# Patient Record
Sex: Male | Born: 1950 | Race: White | Hispanic: No | Marital: Married | Smoking: Former smoker
Health system: Southern US, Community
[De-identification: ages and names within clinical notes are randomized; demographics above are authoritative.]

## PROBLEM LIST (undated history)

## (undated) DIAGNOSIS — J189 Pneumonia, unspecified organism: Secondary | ICD-10-CM

## (undated) DIAGNOSIS — I10 Essential (primary) hypertension: Secondary | ICD-10-CM

## (undated) DIAGNOSIS — D696 Thrombocytopenia, unspecified: Secondary | ICD-10-CM

## (undated) DIAGNOSIS — T7840XA Allergy, unspecified, initial encounter: Secondary | ICD-10-CM

## (undated) DIAGNOSIS — R972 Elevated prostate specific antigen [PSA]: Secondary | ICD-10-CM

## (undated) DIAGNOSIS — N400 Enlarged prostate without lower urinary tract symptoms: Secondary | ICD-10-CM

## (undated) DIAGNOSIS — M722 Plantar fascial fibromatosis: Secondary | ICD-10-CM

## (undated) HISTORY — DX: Benign prostatic hyperplasia without lower urinary tract symptoms: N40.0

## (undated) HISTORY — DX: Allergy, unspecified, initial encounter: T78.40XA

## (undated) HISTORY — DX: Thrombocytopenia, unspecified: D69.6

## (undated) HISTORY — PX: OTHER SURGICAL HISTORY: SHX169

## (undated) HISTORY — DX: Essential (primary) hypertension: I10

## (undated) HISTORY — DX: Pneumonia, unspecified organism: J18.9

## (undated) HISTORY — DX: Elevated prostate specific antigen (PSA): R97.20

---

## 2009-09-06 ENCOUNTER — Ambulatory Visit: Payer: Self-pay | Admitting: Internal Medicine

## 2010-03-24 ENCOUNTER — Ambulatory Visit: Payer: Self-pay | Admitting: Internal Medicine

## 2010-09-19 ENCOUNTER — Other Ambulatory Visit: Payer: Commercial Managed Care - PPO

## 2010-09-19 DIAGNOSIS — I10 Essential (primary) hypertension: Secondary | ICD-10-CM

## 2010-09-19 DIAGNOSIS — R972 Elevated prostate specific antigen [PSA]: Secondary | ICD-10-CM

## 2010-09-19 LAB — CBC WITH DIFFERENTIAL/PLATELET
Basophils Relative: 0 % (ref 0–1)
Eosinophils Absolute: 0.2 10*3/uL (ref 0.0–0.7)
Eosinophils Relative: 3 % (ref 0–5)
HCT: 40.6 % (ref 39.0–52.0)
Hemoglobin: 13.6 g/dL (ref 13.0–17.0)
Lymphs Abs: 1.3 10*3/uL (ref 0.7–4.0)
MCH: 33.5 pg (ref 26.0–34.0)
MCHC: 33.5 g/dL (ref 30.0–36.0)
MCV: 100 fL (ref 78.0–100.0)
Monocytes Absolute: 0.1 10*3/uL (ref 0.1–1.0)
Monocytes Relative: 2 % — ABNORMAL LOW (ref 3–12)
Neutrophils Relative %: 71 % (ref 43–77)
RBC: 4.06 MIL/uL — ABNORMAL LOW (ref 4.22–5.81)

## 2010-09-19 LAB — COMPREHENSIVE METABOLIC PANEL
Alkaline Phosphatase: 66 U/L (ref 39–117)
BUN: 25 mg/dL — ABNORMAL HIGH (ref 6–23)
CO2: 22 mEq/L (ref 19–32)
Glucose, Bld: 96 mg/dL (ref 70–99)
Sodium: 140 mEq/L (ref 135–145)
Total Bilirubin: 0.6 mg/dL (ref 0.3–1.2)
Total Protein: 6.7 g/dL (ref 6.0–8.3)

## 2010-09-19 LAB — LIPID PANEL
Cholesterol: 183 mg/dL (ref 0–200)
HDL: 42 mg/dL (ref >39)
LDL Cholesterol: 124 mg/dL — ABNORMAL HIGH (ref 0–99)
Triglycerides: 84 mg/dL (ref <150)
VLDL: 17 mg/dL (ref 0–40)

## 2010-09-21 ENCOUNTER — Encounter: Payer: Self-pay | Admitting: Internal Medicine

## 2010-09-21 ENCOUNTER — Ambulatory Visit (INDEPENDENT_AMBULATORY_CARE_PROVIDER_SITE_OTHER): Payer: Commercial Managed Care - PPO | Admitting: Internal Medicine

## 2010-09-21 VITALS — BP 128/90 | HR 52 | Temp 98.1°F | Ht 70.25 in | Wt 194.0 lb

## 2010-09-21 DIAGNOSIS — E785 Hyperlipidemia, unspecified: Secondary | ICD-10-CM

## 2010-09-21 DIAGNOSIS — I10 Essential (primary) hypertension: Secondary | ICD-10-CM

## 2010-09-21 DIAGNOSIS — Z0000 Encounter for general adult medical examination without abnormal findings: Secondary | ICD-10-CM

## 2010-09-21 LAB — POCT URINALYSIS DIPSTICK
Blood, UA: NEGATIVE
Clarity, UA: NEGATIVE
Ketones, UA: NEGATIVE
NEG CONTROL: NEGATIVE
Protein, UA: NEGATIVE
Spec Grav, UA: 1.01
Urobilinogen, UA: NEGATIVE

## 2010-09-21 MED ORDER — OLMESARTAN MEDOXOMIL 40 MG PO TABS: 40.0000 mg | ORAL_TABLET | Freq: Every day | ORAL | Status: DC

## 2010-09-21 MED ORDER — SIMVASTATIN 10 MG PO TABS: 10.0000 mg | ORAL_TABLET | Freq: Every evening | ORAL | Status: DC

## 2010-09-21 NOTE — Patient Instructions (Signed)
Take Zocor with supper. Continue Benicar. Return in 6 months or sooner if needed.

## 2010-09-21 NOTE — Progress Notes (Signed)
  Subjective:    Patient ID: Eddie Calhoun, male    DOB: 1951-04-06, 61 y.o.   MRN: [id number]  HPI58 year old W male for CPE and evaluation of HTN. Currently taking Benicar with reasonably good control but should watch BP at home more regularly. Not traveling to [place] quite as much. Went to [facility] recently for prostate followup. Hx of elevated PSA> Recent biopsy was negative. Only new c/o is some throbbing in upper sternal area in am after drinking coffee- self resolves without other symptoms. May be related to palpitations from caffeine ingestion. He says this has gotten better so will not pursue at this time.    Review of Systems  Constitutional: Negative for fatigue and unexpected weight change.  HENT: Positive for rhinorrhea and sneezing. Negative for hearing loss.   Eyes: Negative for itching and visual disturbance.  Respiratory: Negative for cough, choking, chest tightness, shortness of breath and wheezing.   Cardiovascular: Negative for chest pain and leg swelling.  Gastrointestinal: Negative for nausea, vomiting, abdominal pain, diarrhea, constipation, blood in stool and rectal pain.  Genitourinary: Positive for decreased urine volume (slight decrease in urine flow). Negative for difficulty urinating and testicular pain.  Musculoskeletal: Negative for myalgias and arthralgias.  Neurological: Negative for dizziness, syncope and headaches.  Hematological: Does not bruise/bleed easily.  Psychiatric/Behavioral: Negative for dysphoric mood and decreased concentration.       Objective:   Physical Exam  Constitutional: He is oriented to person, place, and time. He appears well-nourished. No distress.  HENT:  Head: Normocephalic.  Mouth/Throat: Oropharynx is clear and moist. No oropharyngeal exudate.  Eyes: Conjunctivae are normal. Pupils are equal, round, and reactive to light. Right eye exhibits no discharge. Left eye exhibits no discharge. No scleral icterus.  Neck: Neck supple. No  JVD present. No tracheal deviation present. No thyromegaly present.  Cardiovascular: Normal rate, regular rhythm, normal heart sounds and intact distal pulses.   No murmur heard. Pulmonary/Chest: Effort normal. No respiratory distress. He has wheezes. He has no rales. He exhibits no tenderness.  Abdominal: Soft. Bowel sounds are normal. He exhibits no distension and no mass. There is no tenderness. There is no rebound and no guarding.  Musculoskeletal: He exhibits no edema.  Lymphadenopathy:    He has no cervical adenopathy.  Neurological: He is alert and oriented to person, place, and time. He displays normal reflexes. No cranial nerve deficit. He exhibits normal muscle tone.  Skin: Skin is warm and dry.  Psychiatric: He has a normal mood and affect. His behavior is normal. Judgment and thought content normal.   Fasting labs reviewed. LDL still elevated at 120s same as last year but total cholesterol is WNL.        Assessment & Plan:   1- HTN stable on Benicar 2-Prostatism per [facility] evaluation is stable and being followed there regularly Return in 6 months 3- Start Zocor 10 mg daily for mild hyperlipidemia

## 2010-09-25 ENCOUNTER — Other Ambulatory Visit: Payer: Self-pay | Admitting: *Deleted

## 2010-09-25 DIAGNOSIS — I10 Essential (primary) hypertension: Secondary | ICD-10-CM

## 2010-09-25 DIAGNOSIS — E785 Hyperlipidemia, unspecified: Secondary | ICD-10-CM

## 2010-09-25 MED ORDER — OLMESARTAN MEDOXOMIL 40 MG PO TABS: 40.0000 mg | ORAL_TABLET | Freq: Every day | ORAL | Status: DC

## 2010-09-25 MED ORDER — SIMVASTATIN 10 MG PO TABS: 10.0000 mg | ORAL_TABLET | Freq: Every evening | ORAL | Status: DC

## 2012-07-15 ENCOUNTER — Telehealth: Payer: Self-pay | Admitting: Oncology

## 2012-07-15 NOTE — Telephone Encounter (Signed)
S/W pt in re NP appt 04/23 @ 3 w/Dr. Cyndie Chime Referring Dr. Danise Edge Dx- Plts [facility]   Welcome packet mailed.

## 2012-07-17 ENCOUNTER — Telehealth: Payer: Self-pay | Admitting: Oncology

## 2012-07-17 NOTE — Telephone Encounter (Signed)
C/D 07/17/12 for appt. 08/27/12

## 2012-07-26 ENCOUNTER — Encounter: Payer: Self-pay | Admitting: Oncology

## 2012-07-26 ENCOUNTER — Other Ambulatory Visit: Payer: Self-pay | Admitting: Oncology

## 2012-07-26 DIAGNOSIS — D696 Thrombocytopenia, unspecified: Secondary | ICD-10-CM

## 2012-07-26 HISTORY — DX: Thrombocytopenia, unspecified: D69.6

## 2012-07-29 ENCOUNTER — Telehealth: Payer: Self-pay | Admitting: Oncology

## 2012-07-29 NOTE — Telephone Encounter (Signed)
S/W PT WIFE IN RE TO APPT CHANGE TO 4/30 @ 3 W/DR. GRANFORTUNA NEW CALENDAR MAILED.

## 2012-08-27 ENCOUNTER — Ambulatory Visit: Payer: Commercial Managed Care - PPO

## 2012-08-27 ENCOUNTER — Other Ambulatory Visit: Payer: Commercial Managed Care - PPO | Admitting: Lab

## 2012-08-27 ENCOUNTER — Encounter: Payer: Commercial Managed Care - PPO | Admitting: Oncology

## 2012-09-01 ENCOUNTER — Telehealth: Payer: Self-pay | Admitting: Oncology

## 2012-09-01 NOTE — Telephone Encounter (Signed)
S/w pt wife in re to NP appt changeto 05/01 @ 9:30

## 2012-09-01 NOTE — Telephone Encounter (Signed)
S/w wife in re to appt change due to MD will not be in office gave new d/t 05/21 @ 9:30.  Will calling referring office to inform of appt change.

## 2012-09-03 ENCOUNTER — Other Ambulatory Visit: Payer: Commercial Managed Care - PPO | Admitting: Lab

## 2012-09-03 ENCOUNTER — Encounter: Payer: Commercial Managed Care - PPO | Admitting: Oncology

## 2012-09-03 ENCOUNTER — Ambulatory Visit: Payer: Commercial Managed Care - PPO

## 2012-09-04 ENCOUNTER — Encounter: Payer: Self-pay | Admitting: Oncology

## 2012-09-04 ENCOUNTER — Ambulatory Visit (HOSPITAL_BASED_OUTPATIENT_CLINIC_OR_DEPARTMENT_OTHER): Payer: Commercial Managed Care - PPO | Admitting: Oncology

## 2012-09-04 ENCOUNTER — Other Ambulatory Visit (HOSPITAL_BASED_OUTPATIENT_CLINIC_OR_DEPARTMENT_OTHER): Payer: Commercial Managed Care - PPO | Admitting: Lab

## 2012-09-04 ENCOUNTER — Ambulatory Visit: Payer: Commercial Managed Care - PPO

## 2012-09-04 ENCOUNTER — Telehealth: Payer: Self-pay | Admitting: Oncology

## 2012-09-04 VITALS — BP 133/81 | HR 56 | Temp 97.8°F | Resp 18 | Ht 71.0 in | Wt 195.0 lb

## 2012-09-04 DIAGNOSIS — D696 Thrombocytopenia, unspecified: Secondary | ICD-10-CM

## 2012-09-04 LAB — CBC WITH DIFFERENTIAL/PLATELET
BASO%: 0.3 % (ref 0.0–2.0)
EOS%: 4.2 % (ref 0.0–7.0)
MCH: 33.8 pg — ABNORMAL HIGH (ref 27.2–33.4)
MCHC: 35 g/dL (ref 32.0–36.0)
MONO#: 0.3 10*3/uL (ref 0.1–0.9)
RBC: 4.24 10*6/uL (ref 4.20–5.82)
RDW: 12.4 % (ref 11.0–14.6)
WBC: 4.2 10*3/uL (ref 4.0–10.3)
lymph#: 0.8 10*3/uL — ABNORMAL LOW (ref 0.9–3.3)

## 2012-09-04 LAB — MORPHOLOGY: PLT EST: DECREASED

## 2012-09-04 LAB — COMPREHENSIVE METABOLIC PANEL (CC13)
ALT: 30 U/L (ref 0–55)
AST: 23 U/L (ref 5–34)
CO2: 26 mEq/L (ref 22–29)
Sodium: 139 mEq/L (ref 136–145)
Total Bilirubin: 0.34 mg/dL (ref 0.20–1.20)
Total Protein: 7.4 g/dL (ref 6.4–8.3)

## 2012-09-04 LAB — CHCC SMEAR

## 2012-09-04 LAB — LACTATE DEHYDROGENASE (CC13): LDH: 174 U/L (ref 125–245)

## 2012-09-04 NOTE — Progress Notes (Signed)
Checked in new pt with no financial concerns. °

## 2012-09-04 NOTE — Telephone Encounter (Signed)
Return prn per 5/1/pof

## 2012-09-05 NOTE — Progress Notes (Signed)
New Patient Hematology-Oncology Evaluation   Eddie Calhoun [id number] 1950-07-12 62 y.o. 09/05/2012  CC: Dr. Danise Edge   Reason for referral: Evaluate mild thrombocytopenia   HPI:  62 year old man who works in [employer] certified [employer] who has been in overall excellent health without any major medical or surgical illness. 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 He was critically ill at the time. He developed pneumonia and liver failure. He had persistent liver function abnormalities for many years which eventually normalized. Other than the liver failure associated with his acute illness over 10 years ago, he had no prior or subsequent episodes of viral hepatitis. His medications are limited to Benicar, aspirin, B12 and multivitamin supplements. He has no signs or symptoms of a collagen vascular disorder. He has had a fluctuating mild decrease in his platelet count. Data provided goes back to October of 2012 when platelet count was recorded as 151,000. In November 2013: 129,000, in March 2014 123,000, and Today 125,000. Hemoglobin and White Count As Well As White Count Differential Have Been Normal. On 07/07/2012 Hemoglobin 14.8, Hematocrit 43.9, MCV 98.9, White Count 5200, 76% Neutrophils, 15 Lymphocytes 6 Monocytes, 3 Eosinophils, and Platelet Count 131,000. He's Had Borderline Macrocytic Red Cell Indices MCV up to 102.  There Is No Family History of Any Blood Disorder.   PMH: Past Medical History  Diagnosis Date  . Hypertension   . Allergy     allergic rhinitis  . Elevated prostate specific antigen (PSA)   . BPH (benign prostatic hyperplasia)   . Pneumonia   . Thrombocytopenia, unspecified 07/26/2012    03/24/12  129,000!  No history of MI, ulcers, diabetes, thyroid disease, renal disease, seizure, stroke, blood clots.  No major surgery.  Allergies: Allergies  Allergen Reactions  . Contrast Media (Iodinated Diagnostic Agents) Hives    Medications:  See history of present illness   Social History: Married in 1 wife accompanies him today. He has no history of exposure to toxic chemicals or radiation.  he quit smoking about 24 years ago. His smoking use included Cigarettes. He has a 40 pack-year smoking history.Marland Kitchen He reports that he does not drink alcohol. Except for a rare glass of champagne. He did not drink at all for the last 10 years since his episode of legionnaire's associated liver malfunction.  Family History: Family History  Problem Relation Age of Onset  . Hypertension Mother   . Hypertension Father   . Cancer Father 33    father died with hx of prostate cancer    Review of Systems: Constitutional symptoms: No constitutional symptoms HEENT: No sore throat Respiratory: No cough or dyspnea Cardiovascular:  No chest pain or palpitations Gastrointestinal ROS: No abdominal pain or change in bowel habit Genito-Urinary ROS: No urinary tract symptoms. History of elevated PSA. Prostate biopsies negative for cancer. Subsequent normalization of PSA. Elevated PSA noted after a long airplane ride. Hematological and Lymphatic: No swollen glands  Musculoskeletal: No muscle bone or joint pain Neurologic: No headache or change in vision Dermatologic: No rash or ecchymosis Remaining ROS negative.  Physical Exam: Blood pressure 133/81, pulse 56, temperature 97.8 F (36.6 C), temperature source Oral, resp. rate 18, height 5\' 11"  (1.803 m), weight 195 lb (88.451 kg). Wt Readings from Last 3 Encounters:  09/04/12 195 lb (88.451 kg)  09/21/10 194 lb (87.998 kg)    General appearance: Well-nourished Caucasian man HENNT: Pharynx no erythema or exudate Lymph nodes: No lymphadenopathy Breasts: Lungs: Clear to auscultation resonant to percussion  Heart: Regular rhythm no murmur Vascular: No cyanosis Abdominal: Soft, nontender, no mass, no organomegaly GU: Extremities: No edema, no calf tenderness Neurologic: Mental status intact,  PERRLA, optic disc sharp, vessels normal, motor strength 5 over 5, reflexes 1+ symmetric, sensation intact to vibration Skin: No rash or ecchymosis    Lab Results: Lab Results: White count differential: 70% neutrophils, 18% lymphocytes, 7% monocytes, 3% eosinophils   Component Value Date   WBC 4.2 09/04/2012   HGB 14.3 09/04/2012   HCT 41.0 09/04/2012   MCV 96.6 09/04/2012   PLT 125* 09/04/2012     Chemistry      Component Value Date/Time   NA 139 09/04/2012 0955   NA 140 09/19/2010 0910   K 5.0 09/04/2012 0955   K 4.3 09/19/2010 0910   CL 105 09/04/2012 0955   CL 107 09/19/2010 0910   CO2 26 09/04/2012 0955   CO2 22 09/19/2010 0910   BUN 30.7* 09/04/2012 0955   BUN 25* 09/19/2010 0910   CREATININE 1.4* 09/04/2012 0955   CREATININE 1.17 09/19/2010 0910      Component Value Date/Time   CALCIUM 9.4 09/04/2012 0955   CALCIUM 9.1 09/19/2010 0910   ALKPHOS 66 09/04/2012 0955   ALKPHOS 66 09/19/2010 0910   AST 23 09/04/2012 0955   AST 23 09/19/2010 0910   ALT 30 09/04/2012 0955   ALT 29 09/19/2010 0910   BILITOT 0.34 09/04/2012 0955   BILITOT 0.6 09/19/2010 0910       Review of peripheral blood film: Normochromic normocytic red cells. Mature white cells. Platelets appear normal in number and morphology.       Impression and Plan: Chronic, mild, fluctuating, thrombocytopenia.  My clinical impression is that this is related to previous legionnaires infection with associated liver damage. I don't have any data of prior to 2012 blood but per the patient's wife, who keeps meticulous records, she believes that his platelet count was slightly decreased in the past. New guidelines with respect to chronic ITP have redefined the normal platelet count as being 100,000 or above. Platelet counts in the 100-150,000 range when followed over 10 years in a large epidemiologic study showed further decline in only about 10% of people. Worldwide, average platelet count is 100,000 or above.  Recommendation: The patient is  reassured that he does not have a major problem with his blood. He can continue annual followup with his family physician. If things change in the future I will be happy to see him again.      Levert Feinstein, MD 09/05/2012, 7:25 AM

## 2012-09-24 ENCOUNTER — Encounter: Payer: Commercial Managed Care - PPO | Admitting: Oncology

## 2012-09-24 ENCOUNTER — Ambulatory Visit: Payer: Commercial Managed Care - PPO

## 2012-09-24 ENCOUNTER — Other Ambulatory Visit: Payer: Commercial Managed Care - PPO | Admitting: Lab

## 2016-10-02 ENCOUNTER — Telehealth: Payer: Self-pay

## 2016-10-02 NOTE — Telephone Encounter (Signed)
Called to clarify with patients wife why the patient need to be seen by Dr. Marlou Porch as patients appt note only states "Patients wife scheduled appointment. D.Miller". Patients wife explained to me that she and Dr. Marlou Porch had a conversation about family member who suddenly died due to cardiac issues and suggested she and her husband have cardiac workup with Calcium Scoring done to assess cardiac health. Patient instructed me that Dr. Earle Gell with [facility] [place] is the Patients PCP and he would be sending a note to Dr. Marlou Porch in regards to patient recent [place]. She instructed me to call PCP and get all notes as Dr. Wynetta Emery is aware that the patient is scheduled to see Dr. Marlou Porch. We are both agreeable to plan. I will contact PCP to have notes faxed

## 2016-10-18 ENCOUNTER — Ambulatory Visit (INDEPENDENT_AMBULATORY_CARE_PROVIDER_SITE_OTHER)
Admission: RE | Admit: 2016-10-18 | Discharge: 2016-10-18 | Disposition: A | Discharge status: Home / Self Care | Payer: Self-pay | Source: Ambulatory Visit | Attending: Cardiology | Admitting: Cardiology

## 2016-10-18 ENCOUNTER — Ambulatory Visit (INDEPENDENT_AMBULATORY_CARE_PROVIDER_SITE_OTHER): Payer: Commercial Managed Care - PPO | Admitting: Cardiology

## 2016-10-18 ENCOUNTER — Encounter (INDEPENDENT_AMBULATORY_CARE_PROVIDER_SITE_OTHER): Payer: Self-pay

## 2016-10-18 ENCOUNTER — Encounter: Payer: Self-pay | Admitting: Cardiology

## 2016-10-18 VITALS — BP 124/82 | HR 53 | Ht 70.0 in | Wt 202.4 lb

## 2016-10-18 DIAGNOSIS — Z87891 Personal history of nicotine dependence: Secondary | ICD-10-CM | POA: Diagnosis not present

## 2016-10-18 DIAGNOSIS — I10 Essential (primary) hypertension: Secondary | ICD-10-CM | POA: Diagnosis not present

## 2016-10-18 IMAGING — CT CT HEART SCORING
2 series · 16 of 20 positions shown, 18 images · non-contrast
Comparison: None.

CLINICAL DATA: Risk stratification

EXAM:
Coronary Calcium Score
TECHNIQUE: The patient was scanned on a Siemens Somatom 64 slice scanner. Axial
non-contrast 3 mm slices were carried out through the heart. The
data set was analyzed on a dedicated work station and scored using
the Agatson method.

[Series 2: casc 3.0 i36f 2 bestdiast 70 % · axial · 0.41mm/px · z∈[-241,-136]mm · 8 of 45 slices shown, 10 images]
[im 5/45  vessel]
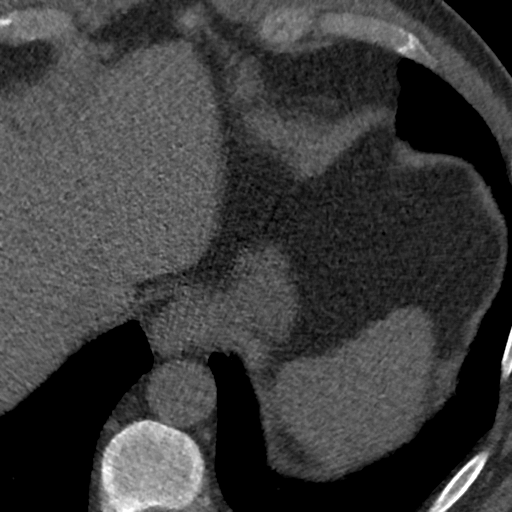
[im 5/45  lung]
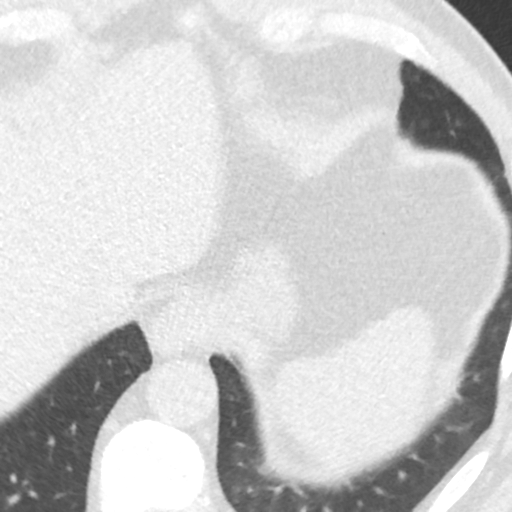
[im 10/45  vessel]
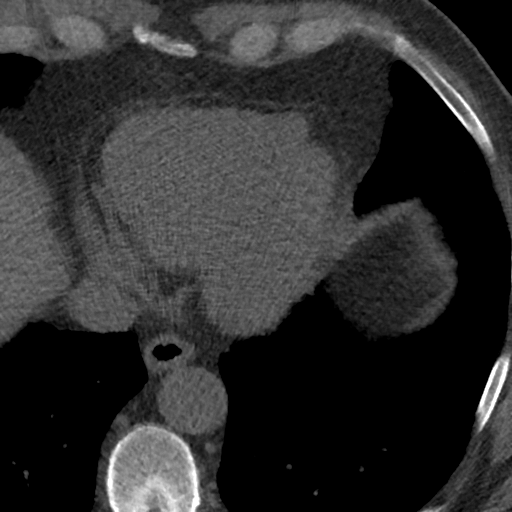
[im 15/45  vessel]
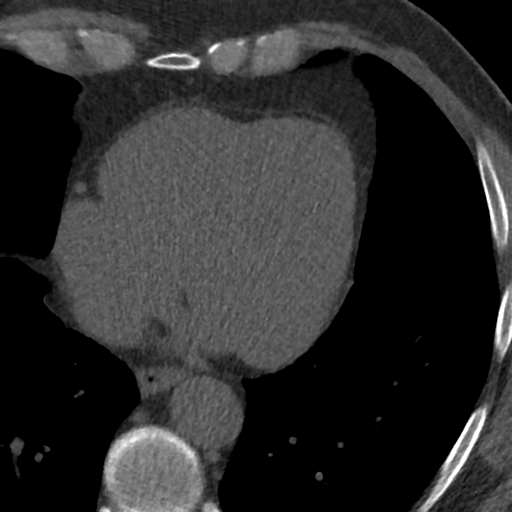
[im 20/45  vessel]
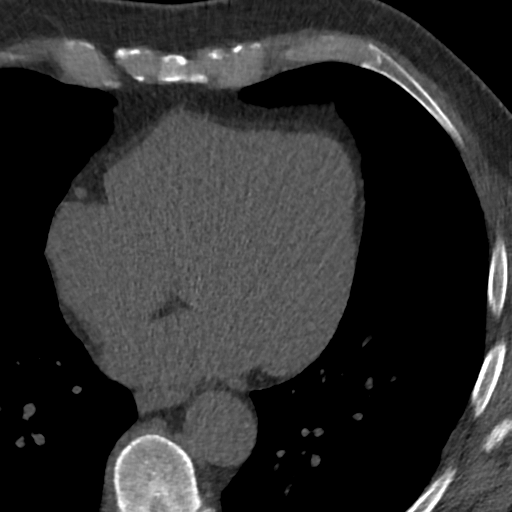
[im 25/45  vessel]
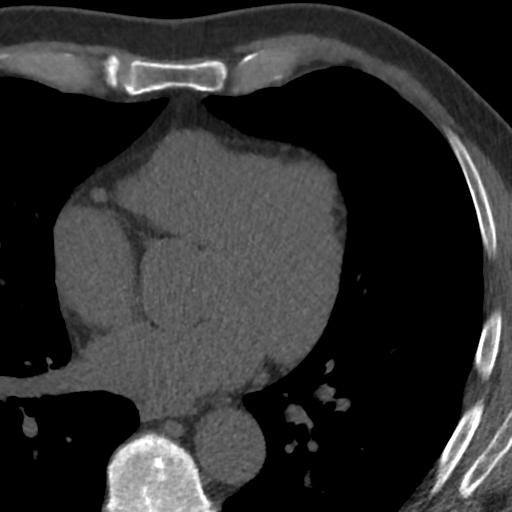
[im 25/45  lung]
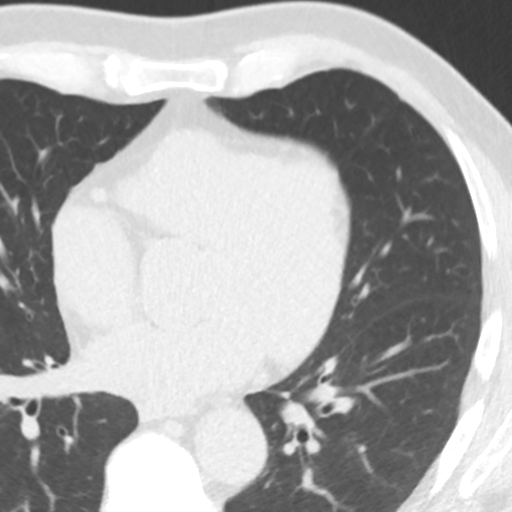
[im 30/45  vessel]
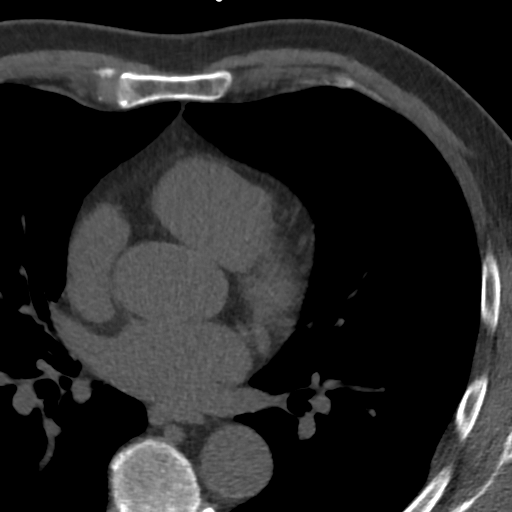
[im 35/45  vessel]
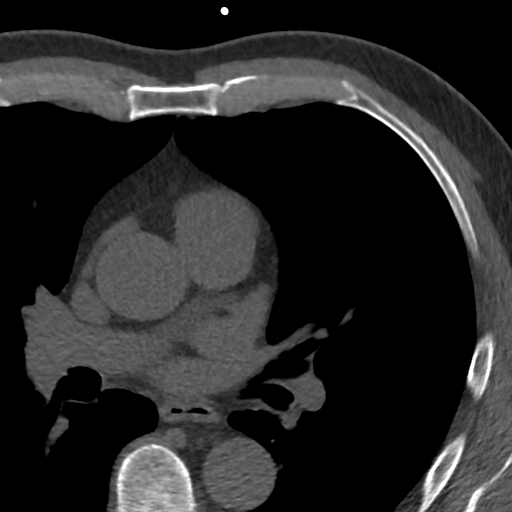
[im 40/45  vessel]
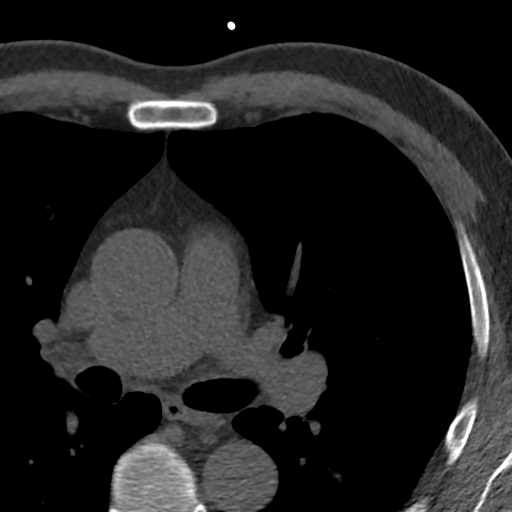

[Series 4: lung st 70 % · axial · 0.70mm/px · z∈[-241,-136]mm · 8 of 45 slices shown]
[im 5/45  lung]
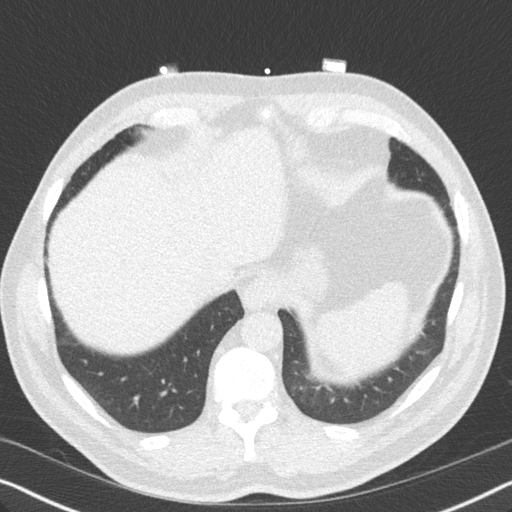
[im 10/45  lung]
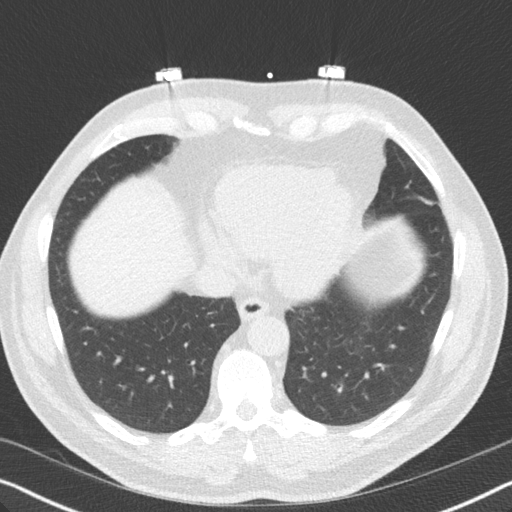
[im 15/45  lung]
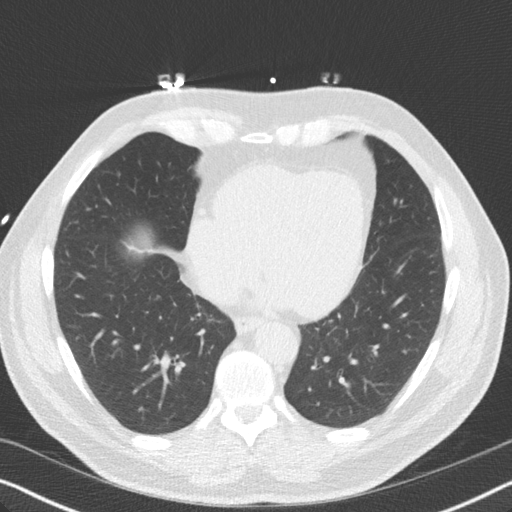
[im 20/45  lung]
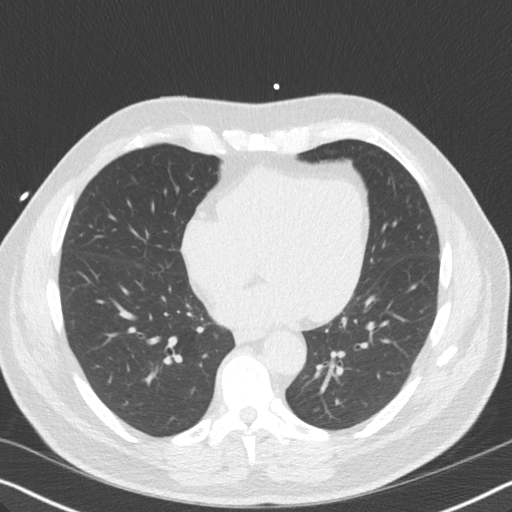
[im 25/45  lung]
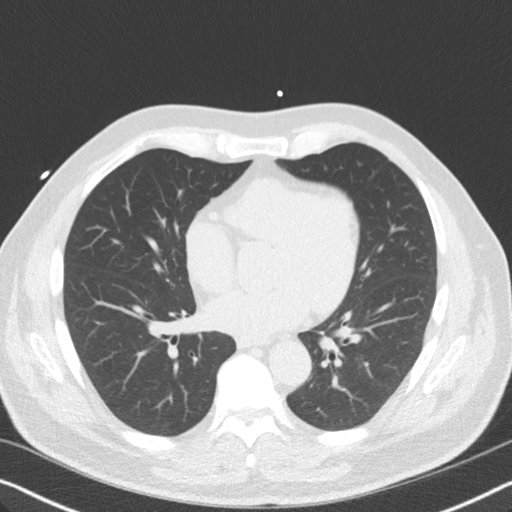
[im 30/45  lung]
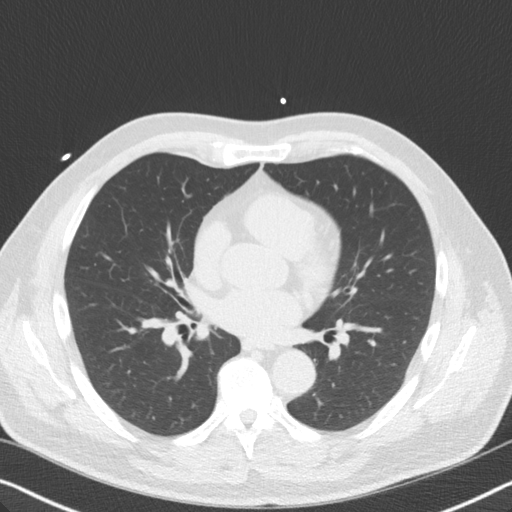
[im 35/45  lung]
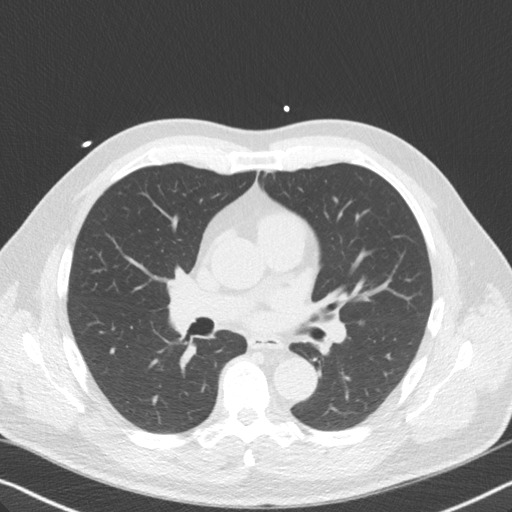
[im 40/45  lung]
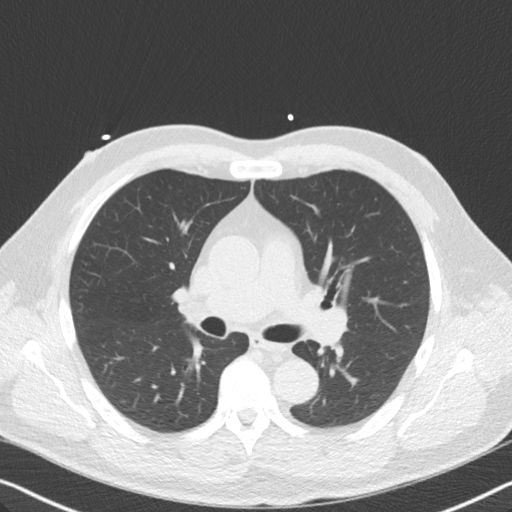

[16 of 20 positions shown; findings below may reference images not displayed]

FINDINGS: Non-cardiac: See separate report from [REDACTED].

Ascending Aorta:  3.6 cm

Pericardium: Normal

Coronary arteries:  No calcium detected
IMPRESSION: Coronary calcium score of 0.

ABO WESAM

EXAM:
OVER-READ INTERPRETATION  CT CHEST

The following report is an over-read performed by radiologist Dr.
over-read does not include interpretation of cardiac or coronary
anatomy or pathology. The coronary calcium score/coronary CTA
interpretation by the cardiologist is attached.
FINDINGS: Small amount of calcified atherosclerotic plaque in the proximal
ascending thoracic aorta (axial image 10 of series 4). A few tiny
pulmonary nodules are noted in the left lower lobe, largest of which
measures only 4 mm (axial image 37 of series 3). Within the
visualized portions of the thorax there are no larger more
suspicious appearing pulmonary nodules or masses, there is no acute
consolidative airspace disease, no pleural effusions, no
pneumothorax and no lymphadenopathy. Visualized portions of the
upper abdomen are unremarkable. There are no aggressive appearing
lytic or blastic lesions noted in the visualized portions of the
skeleton.
IMPRESSION: 1. Aortic atherosclerosis.
2. Multiple tiny pulmonary nodules in the left lower lobe measuring
4 mm or less in size. These are highly nonspecific but statistically
likely benign. No follow-up needed if patient is low-risk (and has
no known or suspected primary neoplasm). Non-contrast chest CT can
be considered in 12 months if patient is high-risk. This
recommendation follows the consensus statement: Guidelines for
Management of Incidental Pulmonary Nodules Detected on CT Images:

## 2016-10-18 NOTE — Patient Instructions (Signed)
Medication Instructions:  The current medical regimen is effective;  continue present plan and medications.  Testing/Procedures: Your physician has requested that you have CA score. Cardiac computed tomography (CT) is a painless test that uses an x-ray machine to take clear, detailed pictures of your heart.  The cost of this testing is $150 and is not covered by insurance.  Follow-Up: Follow up as needed after the above testing.  If you need a refill on your cardiac medications before your next appointment, please call your pharmacy.  Thank you for choosing [facility]!!

## 2016-10-18 NOTE — Progress Notes (Addendum)
Cardiology Office Note:    Date:  10/18/2016   ID:  Eddie Calhoun, DOB Aug 01, 1950, MRN [id number]  PCP:  Garlan Fair, MD  Cardiologist:  Candee Furbish, MD    Referring MD: Garlan Fair, MD     History of Present Illness:    Eddie Calhoun is a 66 y.o. male here for the evaluation of cardiac risk factors at the request of Dr. Wynetta Emery. He has a history of hypertension. No prior history of CAD or PVD. No smoking. Quit 1989 on [place] back from [place]. Has ED. Currently on Benicar.  Enjoys exercising, aerobically. Father had colon cancer at age 71. Works as a [employer]. Had itching with IV dye in the past.  Back in 2007 had a negative treadmill stress test.  Hay Fever  Hemoglobin 13.6 creatinine 1.2 ALT 19, LDL 127.  Past Medical History:  Diagnosis Date  . Allergy    allergic rhinitis  . BPH (benign prostatic hyperplasia)   . Elevated prostate specific antigen (PSA)   . Hypertension   . Pneumonia   . Thrombocytopenia, unspecified ([place]) 07/26/2012   03/24/12  129,000!    No past surgical history on file.  Current Medications: Current Meds  Medication Sig  . aspirin 81 MG EC tablet Take 81 mg by mouth daily.    . cyanocobalamin 2000 MCG tablet Take 2,000 mcg by mouth daily.  . Multiple Vitamin (MULTIVITAMIN) tablet Take 1 tablet by mouth daily.    Marland Kitchen olmesartan (BENICAR) 40 MG tablet Take 1 tablet (40 mg total) by mouth daily.     Allergies:   Contrast media [iodinated diagnostic agents]   Social History   Social History  . Marital status: Married    Spouse name: N/A  . Number of children: N/A  . Years of education: N/A   Social History Main Topics  . Smoking status: Former Smoker    Packs/day: 2.00    Years: 20.00    Types: Cigarettes    Quit date: 09/21/1987  . Smokeless tobacco: Never Used  . Alcohol use No  . Drug use: Unknown  . Sexual activity: Not Asked   Other Topics Concern  . None   Social History  Narrative  . None     Family History: The patient's family history includes Cancer (age of onset: 72) in his father; Hypertension in his father and mother. ROS:   Please see the history of present illness.   No syncope, bleeding, orthopnea, PND  All other systems reviewed and are negative.  EKGs/Labs/Other Studies Reviewed:    The following studies were reviewed today: Prior office notes, EKG, lab work  EKG:  EKG is  ordered today.  The ekg ordered today demonstrates 10/18/16-sinus bradycardia rate 53 with borderline incomplete right bundle branch block. No other abnormalities. When compared to prior EKG from September 2007, no significant change. Personally viewed.  Recent Labs: No results found for requested labs within last 8760 hours.   Recent Lipid Panel    Component Value Date/Time   CHOL 183 09/19/2010 0910   TRIG 84 09/19/2010 0910   HDL 42 09/19/2010 0910   CHOLHDL 4.4 09/19/2010 0910   VLDL 17 09/19/2010 0910   LDLCALC 124 (H) 09/19/2010 0910    Physical Exam:    VS:  BP 124/82 (BP Location: Right Arm)   Pulse (!) 53   Ht 5\' 10"  (1.778 m)   Wt 202 lb 6.4 oz (91.8 kg)  BMI 29.04 kg/m     Wt Readings from Last 3 Encounters:  10/18/16 202 lb 6.4 oz (91.8 kg)  09/04/12 195 lb (88.5 kg)  09/21/10 194 lb (88 kg)     GEN:  Well nourished, well developed in no acute distress HEENT: Normal NECK: No JVD; No carotid bruits LYMPHATICS: No lymphadenopathy CARDIAC: RRR, no murmurs, rubs, gallops RESPIRATORY:  Clear to auscultation without rales, wheezing or rhonchi  ABDOMEN: Soft, non-tender, non-distended MUSCULOSKELETAL:  No edema; No deformity  SKIN: Warm and dry NEUROLOGIC:  Alert and oriented x 3 PSYCHIATRIC:  Normal affect   ASSESSMENT:    1. Essential hypertension   2. Former cigarette smoker    PLAN:    In order of problems listed above:  Essential hypertension  - Currently well controlled on Benicar.  Former smoker  - Quit many years ago.  Doing well.  His wife [name], had a calcium score and he would like to have this test performed. He is going to be retiring soon. This will help guide [place] for possible statin use for instance or to enhance his diet, exercise. So far, he is on an aspirin for prevention and good blood pressure control. He is no longer smoking, it is been many years. He is not having any active anginal symptoms. The good work.   Medication Adjustments/Labs and Tests Ordered: Current medicines are reviewed at length with the patient today.  Concerns regarding medicines are outlined above. Labs and tests ordered and medication changes are outlined in the patient instructions below:  Patient Instructions  Medication Instructions:  The current medical regimen is effective;  continue present plan and medications.  Testing/Procedures: Your physician has requested that you have CA score. Cardiac computed tomography (CT) is a painless test that uses an x-ray machine to take clear, detailed pictures of your heart.  The cost of this testing is $150 and is not covered by insurance.  Follow-Up: Follow up as needed after the above testing.  If you need a refill on your cardiac medications before your next appointment, please call your pharmacy.  Thank you for choosing [facility]!!        Signed, Candee Furbish, MD  10/18/2016 9:38 AM    [place]

## 2019-06-02 ENCOUNTER — Ambulatory Visit: Payer: Commercial Managed Care - PPO

## 2019-06-11 ENCOUNTER — Ambulatory Visit: Payer: Medicare Other | Attending: Internal Medicine

## 2019-06-11 DIAGNOSIS — Z23 Encounter for immunization: Secondary | ICD-10-CM

## 2019-06-23 ENCOUNTER — Ambulatory Visit: Payer: Commercial Managed Care - PPO

## 2019-07-02 ENCOUNTER — Ambulatory Visit: Payer: Commercial Managed Care - PPO

## 2019-07-06 ENCOUNTER — Ambulatory Visit: Payer: Medicare Other | Attending: Internal Medicine

## 2019-07-06 DIAGNOSIS — Z23 Encounter for immunization: Secondary | ICD-10-CM

## 2019-07-06 NOTE — Progress Notes (Signed)
   Covid-19 Vaccination Clinic  Name:  ANIL CLAYTOR    MRN: [id number] DOB: 1951-04-12  07/06/2019  Mr. Levitin was observed post Covid-19 immunization for 15 minutes without incidence. He was provided with Vaccine Information Sheet and instruction to access the V-Safe system.   Mr. Campus was instructed to call 911 with any severe reactions post vaccine: Marland Kitchen Difficulty breathing  . Swelling of your face and throat  . A fast heartbeat  . A bad rash all over your body  . Dizziness and weakness    Immunizations Administered    Name Date Dose VIS Date Route   Pfizer COVID-19 Vaccine 07/06/2019  3:58 PM 0.3 mL 04/17/2019 Intramuscular   Manufacturer: [place]   Lot: [id number]   [place]: [id number]

## 2020-11-02 ENCOUNTER — Ambulatory Visit (INDEPENDENT_AMBULATORY_CARE_PROVIDER_SITE_OTHER): Payer: Medicare Other | Admitting: Podiatry

## 2020-11-02 ENCOUNTER — Other Ambulatory Visit: Payer: Self-pay

## 2020-11-02 ENCOUNTER — Ambulatory Visit (INDEPENDENT_AMBULATORY_CARE_PROVIDER_SITE_OTHER): Payer: Medicare Other

## 2020-11-02 ENCOUNTER — Encounter: Payer: Self-pay | Admitting: Podiatry

## 2020-11-02 DIAGNOSIS — M722 Plantar fascial fibromatosis: Secondary | ICD-10-CM

## 2020-11-02 NOTE — Progress Notes (Signed)
Subjective:  Patient ID: Eddie Calhoun, male    DOB: July 01, 1950,  MRN: [id number]  Chief Complaint  Patient presents with   Foot Pain    Right heel pain     70 y.o. male presents with the above complaint.  Patient presents with complaint of right heel pain.  Patient states been on for few weeks is sharp pain in the heel after walking for a while.  Patient walks about 3 miles a day every day.  The pain is causing him not to be as active.  He has not seen anyone else prior to seeing me.  He would like to discuss treatment options for this.  He has tried some over-the-counter medication none of which has helped.  He denies any other acute complaints.   Review of Systems: Negative except as noted in the HPI. Denies N/V/F/Ch.  Past Medical History:  Diagnosis Date   Allergy    allergic rhinitis   BPH (benign prostatic hyperplasia)    Elevated prostate specific antigen (PSA)    Hypertension    Pneumonia    Thrombocytopenia, unspecified ([place]) 07/26/2012   03/24/12  129,000!    Current Outpatient Medications:    aspirin 81 MG EC tablet, Take 81 mg by mouth daily.  , Disp: , Rfl:    cyanocobalamin 2000 MCG tablet, Take 2,000 mcg by mouth daily., Disp: , Rfl:    Multiple Vitamin (MULTIVITAMIN) tablet, Take 1 tablet by mouth daily.  , Disp: , Rfl:    olmesartan (BENICAR) 40 MG tablet, Take 1 tablet (40 mg total) by mouth daily., Disp: 90 tablet, Rfl: 3  Social History   Tobacco Use  Smoking Status Former   Packs/day: 2.00   Years: 20.00   Pack years: 40.00   Types: Cigarettes   Quit date: 09/21/1987   Years since quitting: 33.1  Smokeless Tobacco Never    Allergies  Allergen Reactions   Contrast Media [Iodinated Diagnostic Agents] Hives   Objective:  There were no vitals filed for this visit. There is no height or weight on file to calculate BMI. Constitutional Well developed. Well nourished.  Vascular Dorsalis pedis pulses palpable bilaterally. Posterior tibial pulses  palpable bilaterally. Capillary refill normal to all digits.  No cyanosis or clubbing noted. Pedal hair growth normal.  Neurologic Normal speech. Oriented to person, place, and time. Epicritic sensation to light touch grossly present bilaterally.  Dermatologic Nails well groomed and normal in appearance. No open wounds. No skin lesions.  Orthopedic: Normal joint ROM without pain or crepitus bilaterally. No visible deformities. Tender to palpation at the calcaneal tuber right. No pain with calcaneal squeeze right. Ankle ROM diminished range of motion right. Silfvers[place]old Test: positive right.   Radiographs: Taken and reviewed. No acute fractures or dislocations. No evidence of stress fracture.  Plantar heel spur present. Posterior heel spur absent.   Assessment:   1. Plantar fasciitis of right foot    Plan:  Patient was evaluated and treated and all questions answered.  Plantar Fasciitis, right - XR reviewed as above.  - Educated on icing and stretching. Instructions given.  - Injection delivered to the plantar fascia as below. - DME: Plantar Fascial Brace - Pharmacologic management: None  Procedure: Injection Tendon/Ligament Location: Right plantar fascia at the glabrous junction; medial approach. Skin Prep: alcohol Injectate: 0.5 cc 0.5% marcaine plain, 0.5 cc of 1% Lidocaine, 0.5 cc kenalog 10. Disposition: Patient tolerated procedure well. Injection site dressed with a band-aid.  No follow-ups on file.

## 2020-11-08 ENCOUNTER — Other Ambulatory Visit: Payer: Self-pay | Admitting: Internal Medicine

## 2020-11-08 ENCOUNTER — Ambulatory Visit
Admission: RE | Admit: 2020-11-08 | Discharge: 2020-11-08 | Disposition: A | Discharge status: Home / Self Care | Payer: Medicare Other | Source: Ambulatory Visit | Attending: Internal Medicine | Admitting: Internal Medicine

## 2020-11-08 DIAGNOSIS — R202 Paresthesia of skin: Secondary | ICD-10-CM

## 2020-11-08 IMAGING — CR DG CERVICAL SPINE 2 OR 3 VIEWS
3 series · 3 of 3 positions shown · non-contrast
Comparison: None.

CLINICAL DATA: Paresthesia of both hands

EXAM:
CERVICAL SPINE - 2-3 VIEW

[w c-spine lat]
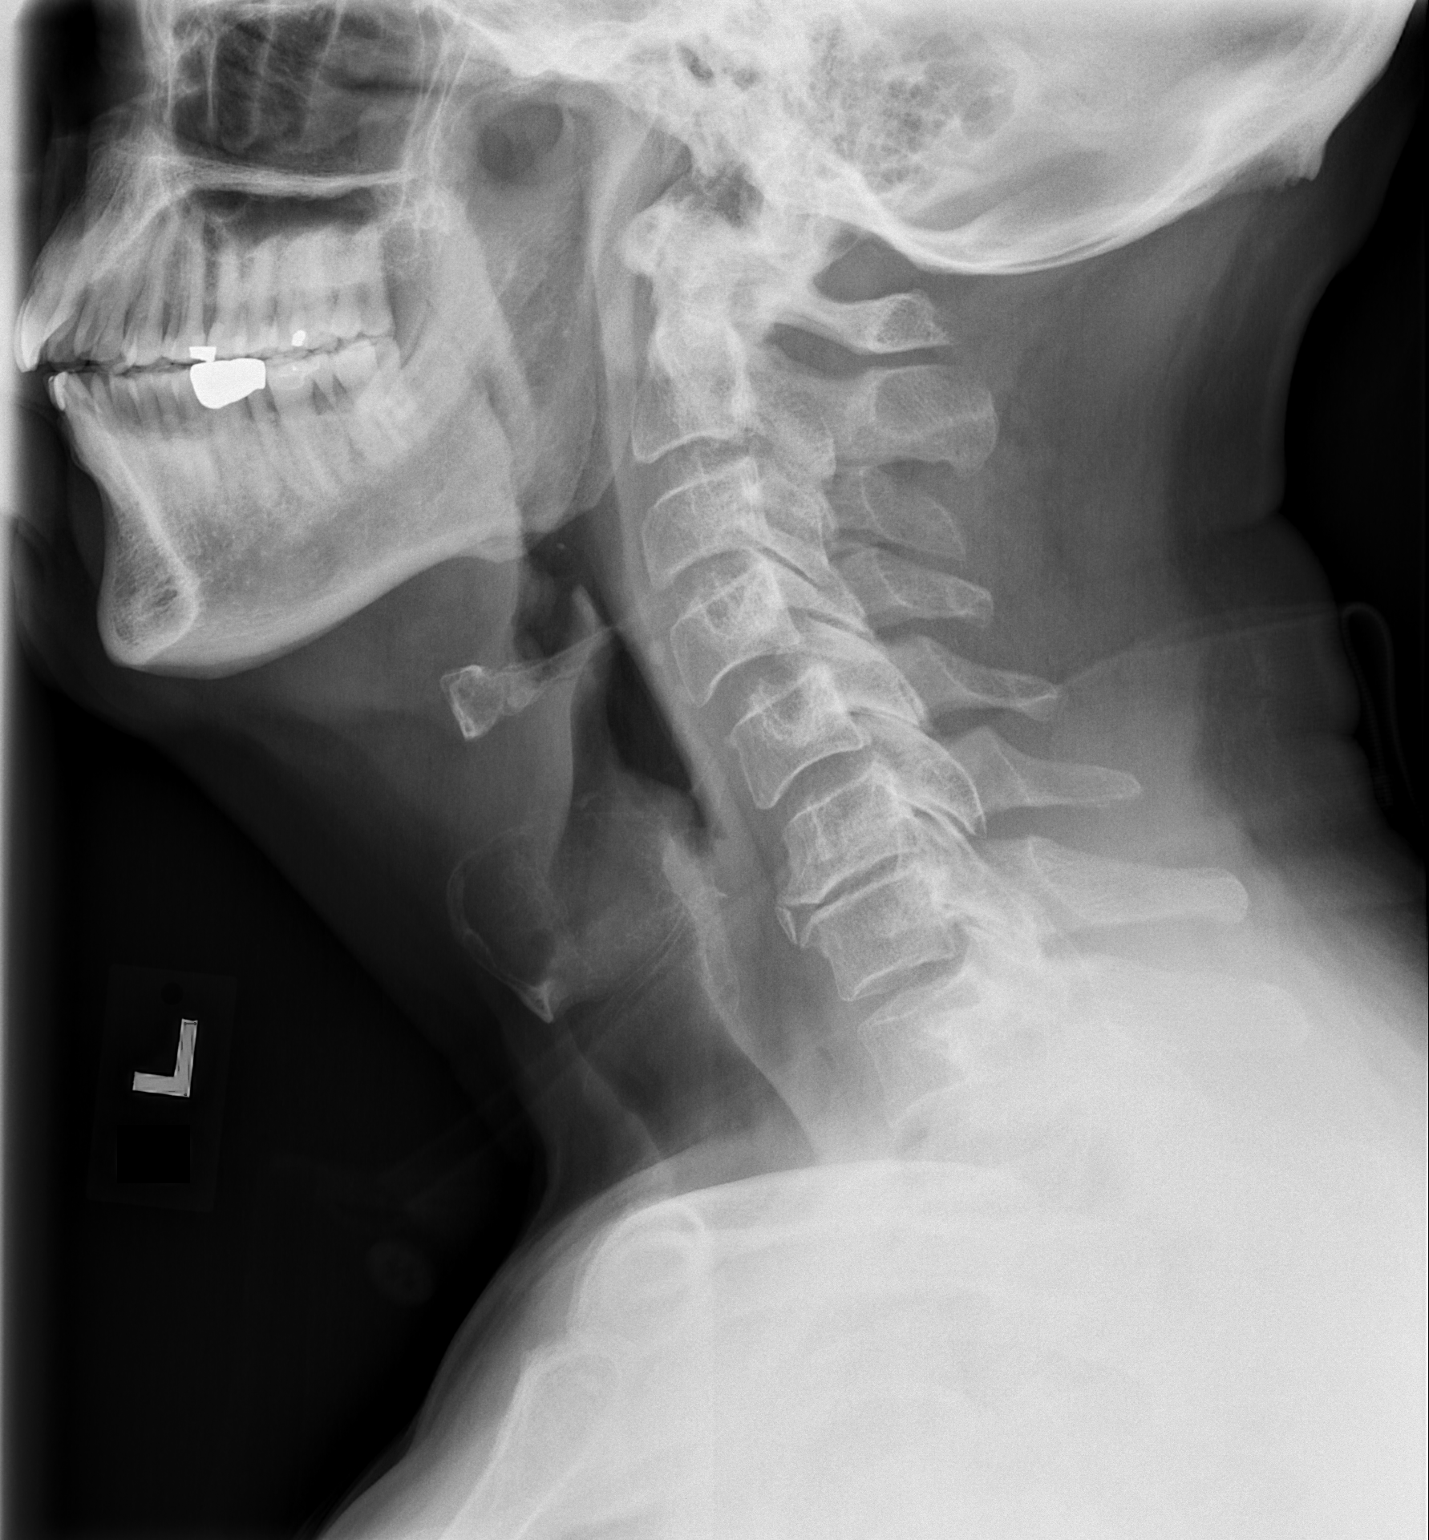

[w c-spine a.p. *]
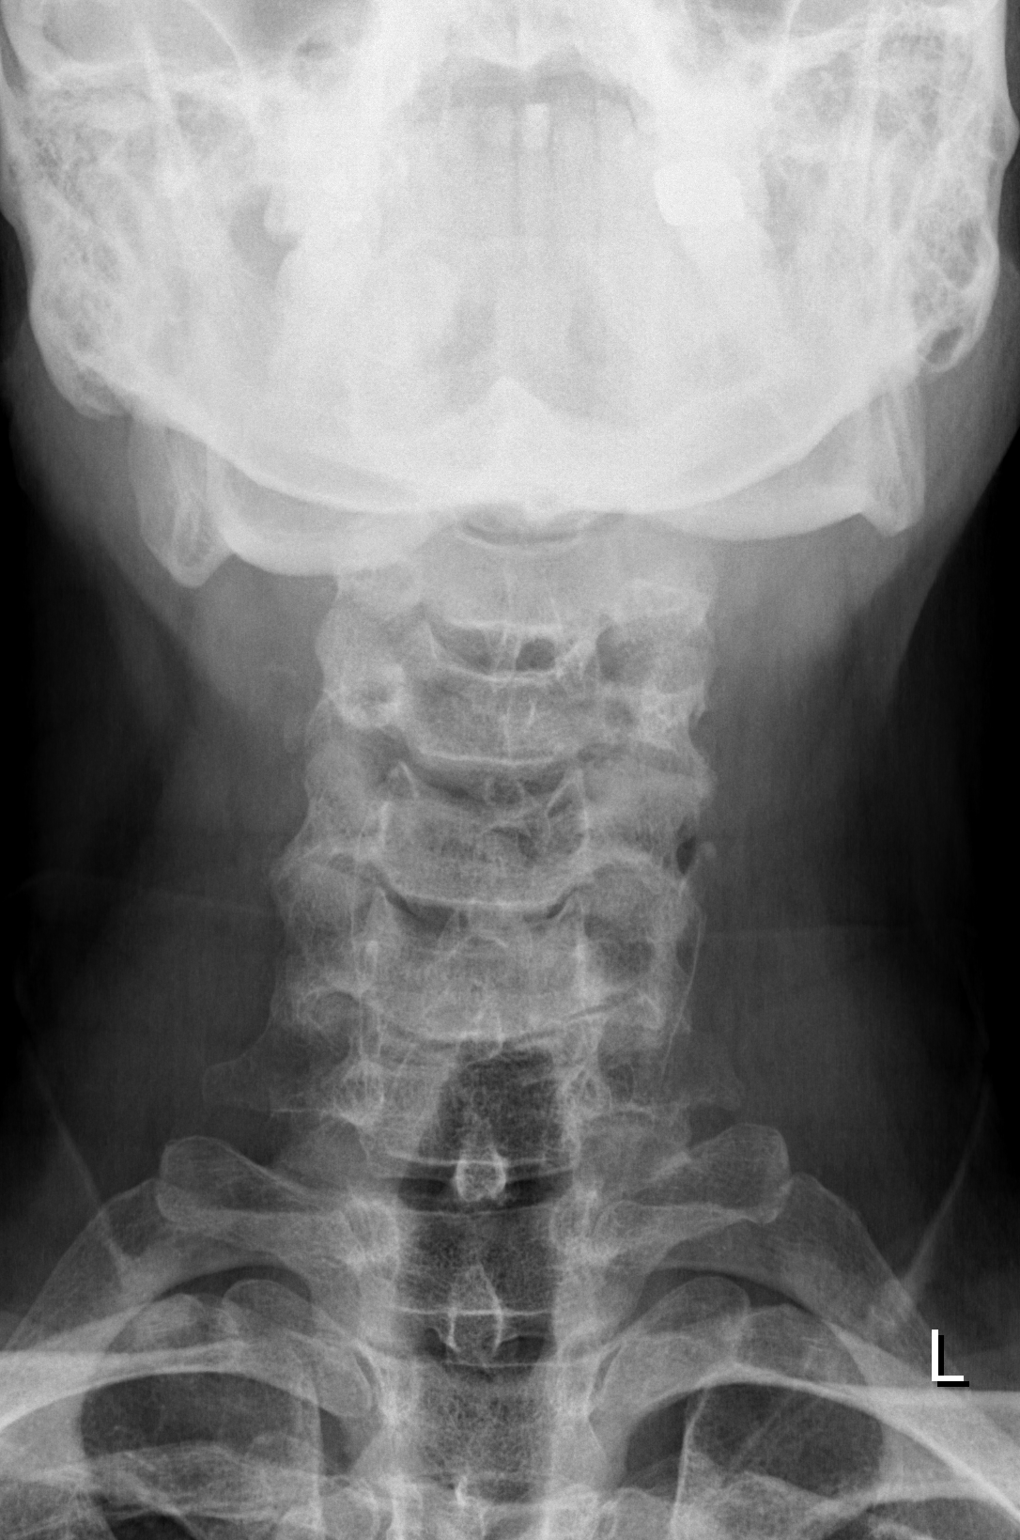

[w c-spine odontoid *]
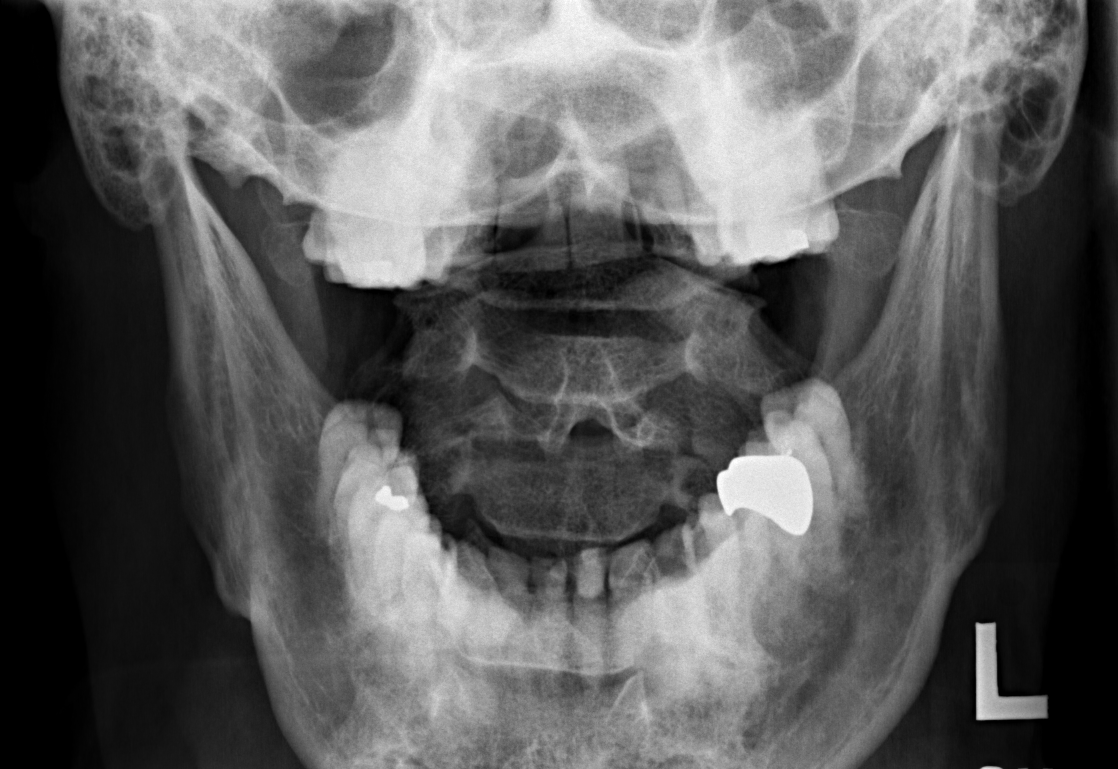

[3 of 3 positions shown; findings below may reference images not displayed]

FINDINGS: Trace anterolisthesis C4 on C5 with mild reversal of cervical
lordosis. Vertebral body heights are maintained. Moderate disc space
narrowing and degenerative change C6-C7. The dens and lateral masses
are mostly obscured by overlying teeth.
IMPRESSION: 1. Moderate degenerative changes at C6-C7.

## 2020-11-30 ENCOUNTER — Other Ambulatory Visit: Payer: Self-pay

## 2020-11-30 ENCOUNTER — Ambulatory Visit (INDEPENDENT_AMBULATORY_CARE_PROVIDER_SITE_OTHER): Payer: Medicare Other | Admitting: Podiatry

## 2020-11-30 ENCOUNTER — Encounter: Payer: Self-pay | Admitting: Podiatry

## 2020-11-30 DIAGNOSIS — M722 Plantar fascial fibromatosis: Secondary | ICD-10-CM | POA: Diagnosis not present

## 2020-11-30 NOTE — Progress Notes (Signed)
  Subjective:  Patient ID: Eddie Calhoun, male    DOB: 18-Sep-1950,  MRN: [id number]  Chief Complaint  Patient presents with   Plantar Fasciitis    Right heel pain     70 y.o. male presents with the above complaint.  Patient presents for follow-up of right Planter fasciitis.  Patient states that he is doing a little bit better is about 50% improved with the first injection.  He has stopped wearing the brace as that has not been helping him as much.  He would like to discuss next treatment options h he has obtained better shoes.   Review of Systems: Negative except as noted in the HPI. Denies N/V/F/Ch.  Past Medical History:  Diagnosis Date   Allergy    allergic rhinitis   BPH (benign prostatic hyperplasia)    Elevated prostate specific antigen (PSA)    Hypertension    Pneumonia    Thrombocytopenia, unspecified ([facility]) 07/26/2012   03/24/12  129,000!    Current Outpatient Medications:    aspirin 81 MG EC tablet, Take 81 mg by mouth daily.  , Disp: , Rfl:    cyanocobalamin 2000 MCG tablet, Take 2,000 mcg by mouth daily., Disp: , Rfl:    Multiple Vitamin (MULTIVITAMIN) tablet, Take 1 tablet by mouth daily.  , Disp: , Rfl:    olmesartan (BENICAR) 40 MG tablet, Take 1 tablet (40 mg total) by mouth daily., Disp: 90 tablet, Rfl: 3  Social History   Tobacco Use  Smoking Status Former   Packs/day: 2.00   Years: 20.00   Pack years: 40.00   Types: Cigarettes   Quit date: 09/21/1987   Years since quitting: 33.2  Smokeless Tobacco Never    Allergies  Allergen Reactions   Contrast Media [Iodinated Diagnostic Agents] Hives   Objective:  There were no vitals filed for this visit. There is no height or weight on file to calculate BMI. Constitutional Well developed. Well nourished.  Vascular Dorsalis pedis pulses palpable bilaterally. Posterior tibial pulses palpable bilaterally. Capillary refill normal to all digits.  No cyanosis or clubbing noted. Pedal hair growth normal.   Neurologic Normal speech. Oriented to person, place, and time. Epicritic sensation to light touch grossly present bilaterally.  Dermatologic Nails well groomed and normal in appearance. No open wounds. No skin lesions.  Orthopedic: Normal joint ROM without pain or crepitus bilaterally. No visible deformities. Tender to palpation at the calcaneal tuber right. No pain with calcaneal squeeze right. Ankle ROM diminished range of motion right. Silfverskiold Test: positive right.   Radiographs: Taken and reviewed. No acute fractures or dislocations. No evidence of stress fracture.  Plantar heel spur present. Posterior heel spur absent.   Assessment:   No diagnosis found.  Plan:  Patient was evaluated and treated and all questions answered.  Plantar Fasciitis, right - XR reviewed as above.  - Educated on icing and stretching. Instructions given.  -Second injection delivered to the plantar fascia as below. - DME: Plantar Fascial Brace - Pharmacologic management: None  Procedure: Injection Tendon/Ligament Location: Right plantar fascia at the glabrous junction; medial approach. Skin Prep: alcohol Injectate: 0.5 cc 0.5% marcaine plain, 0.5 cc of 1% Lidocaine, 0.5 cc kenalog 10. Disposition: Patient tolerated procedure well. Injection site dressed with a band-aid.  No follow-ups on file.

## 2021-01-06 ENCOUNTER — Encounter: Payer: Self-pay | Admitting: Podiatry

## 2021-01-06 ENCOUNTER — Other Ambulatory Visit: Payer: Self-pay

## 2021-01-06 ENCOUNTER — Ambulatory Visit (INDEPENDENT_AMBULATORY_CARE_PROVIDER_SITE_OTHER): Payer: Medicare Other | Admitting: Podiatry

## 2021-01-06 DIAGNOSIS — M722 Plantar fascial fibromatosis: Secondary | ICD-10-CM | POA: Diagnosis not present

## 2021-01-06 NOTE — Progress Notes (Signed)
  Subjective:  Patient ID: Eddie Calhoun, male    DOB: [birth year],  MRN: [id number]  Chief Complaint  Patient presents with   Plantar Fasciitis    Right foot 4wk follow up  PT stated that he is doing well he does have some pain     71 y.o. male presents with the above complaint.  Patient presents for follow-up of Planter fasciitis.  He states that he is doing well.  He denies any other acute complaints he states he is about 85 to 90% better.  He still has a little bit of bruising feeling but overall he is doing much better with shoe gear changes and power steps.  He denies any other acute complaints  Review of Systems: Negative except as noted in the HPI. Denies N/V/F/Ch.  Past Medical History:  Diagnosis Date   Allergy    allergic rhinitis   BPH (benign prostatic hyperplasia)    Elevated prostate specific antigen (PSA)    Hypertension    Pneumonia    Thrombocytopenia, unspecified ([facility]) 07/26/2012   03/24/12  129,000!    Current Outpatient Medications:    aspirin 81 MG EC tablet, Take 81 mg by mouth daily.  , Disp: , Rfl:    cyanocobalamin 2000 MCG tablet, Take 2,000 mcg by mouth daily., Disp: , Rfl:    Multiple Vitamin (MULTIVITAMIN) tablet, Take 1 tablet by mouth daily.  , Disp: , Rfl:    olmesartan (BENICAR) 40 MG tablet, Take 1 tablet (40 mg total) by mouth daily., Disp: 90 tablet, Rfl: 3  Social History   Tobacco Use  Smoking Status Former   Packs/day: 2.00   Years: 20.00   Pack years: 40.00   Types: Cigarettes   Quit date: 09/21/1987   Years since quitting: 33.3  Smokeless Tobacco Never    Allergies  Allergen Reactions   Contrast Media [Iodinated Diagnostic Agents] Hives   Objective:  There were no vitals filed for this visit. There is no height or weight on file to calculate BMI. Constitutional Well developed. Well nourished.  Vascular Dorsalis pedis pulses palpable bilaterally. Posterior tibial pulses palpable bilaterally. Capillary refill normal to all  digits.  No cyanosis or clubbing noted. Pedal hair growth normal.  Neurologic Normal speech. Oriented to person, place, and time. Epicritic sensation to light touch grossly present bilaterally.  Dermatologic Nails well groomed and normal in appearance. No open wounds. No skin lesions.  Orthopedic: Normal joint ROM without pain or crepitus bilaterally. No visible deformities. No further tender to palpation at the calcaneal tuber right. No pain with calcaneal squeeze right. Ankle ROM diminished range of motion right. Silfverskiold Test: positive right.   Radiographs: Taken and reviewed. No acute fractures or dislocations. No evidence of stress fracture.  Plantar heel spur present. Posterior heel spur absent.   Assessment:   1. Plantar fasciitis of right foot     Plan:  Patient was evaluated and treated and all questions answered.  Plantar Fasciitis, right -Clinically healed with a steroid injection as well as making shoe gear modification with power steps orthotics.  I encouraged him to continue wearing orthotics and insoles for more of a prevention.  Patient states understanding if any foot and ankle issues arise have asked him to come see me.  No follow-ups on file.

## 2021-02-23 ENCOUNTER — Other Ambulatory Visit: Payer: Self-pay | Admitting: Internal Medicine

## 2021-02-23 DIAGNOSIS — M5412 Radiculopathy, cervical region: Secondary | ICD-10-CM

## 2021-02-26 ENCOUNTER — Other Ambulatory Visit: Payer: Self-pay

## 2021-02-26 ENCOUNTER — Ambulatory Visit
Admission: RE | Admit: 2021-02-26 | Discharge: 2021-02-26 | Disposition: A | Discharge status: Home / Self Care | Payer: PRIVATE HEALTH INSURANCE | Source: Ambulatory Visit | Attending: Internal Medicine | Admitting: Internal Medicine

## 2021-02-26 DIAGNOSIS — M5412 Radiculopathy, cervical region: Secondary | ICD-10-CM

## 2021-02-26 IMAGING — MR MR CERVICAL SPINE W/O CM
4 of 5 series · 27 of 48 positions shown · non-contrast
Comparison: Cervical spine radiographs [DATE].

CLINICAL DATA: 70-year-old male with numbness and tingling,
bilateral hand pain [REDACTED]. Progressive symptoms.

EXAM:
MRI CERVICAL SPINE WITHOUT CONTRAST
TECHNIQUE: Multiplanar, multisequence MR imaging of the cervical spine was
performed. No intravenous contrast was administered.

[Series 5: T2 · sagittal · 3.0mm · 0.72mm/px · 6 of 16 slices shown (1 of 2)]
[im 1/16]
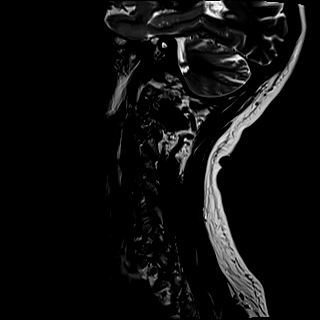
[im 4/16]
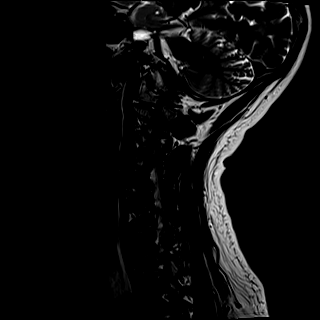
[im 7/16]
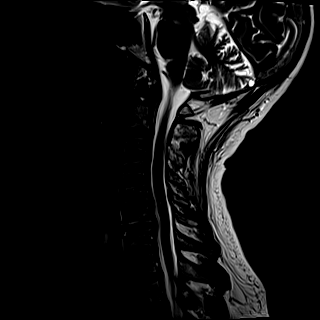
[im 10/16]
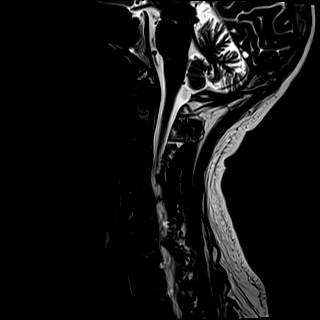
[im 13/16]
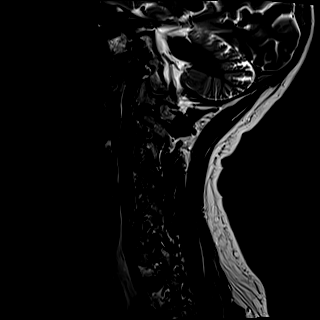
[im 16/16]
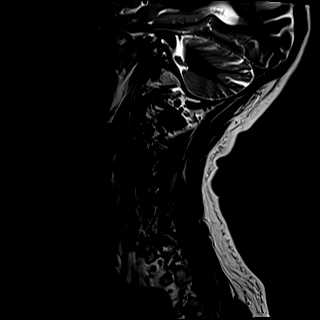

[Series 6: T1 · sagittal · 3.0mm · 0.66mm/px · 7 of 16 slices shown]
[im 1/16]
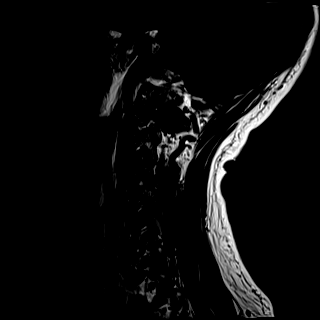
[im 3/16]
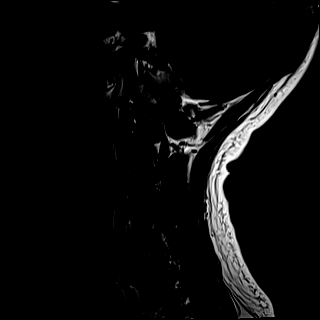
[im 6/16]
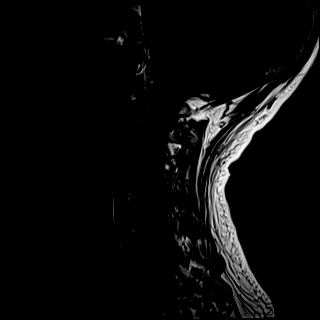
[im 8/16]
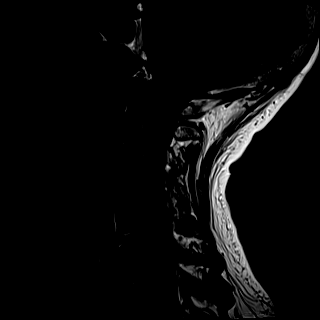
[im 11/16]
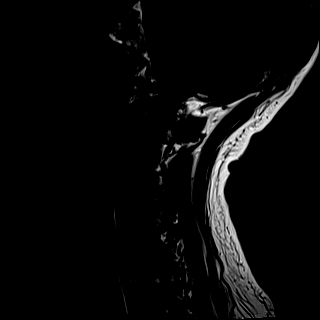
[im 13/16]
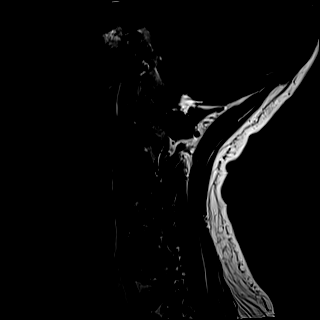
[im 16/16]
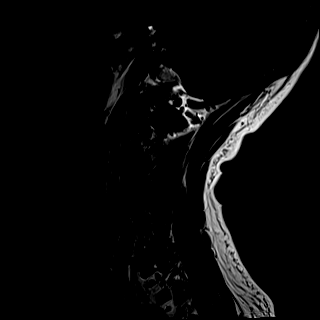

[Series 7: STIR · sagittal · 3.0mm · 0.33mm/px · 6 of 16 slices shown]
[im 1/16]
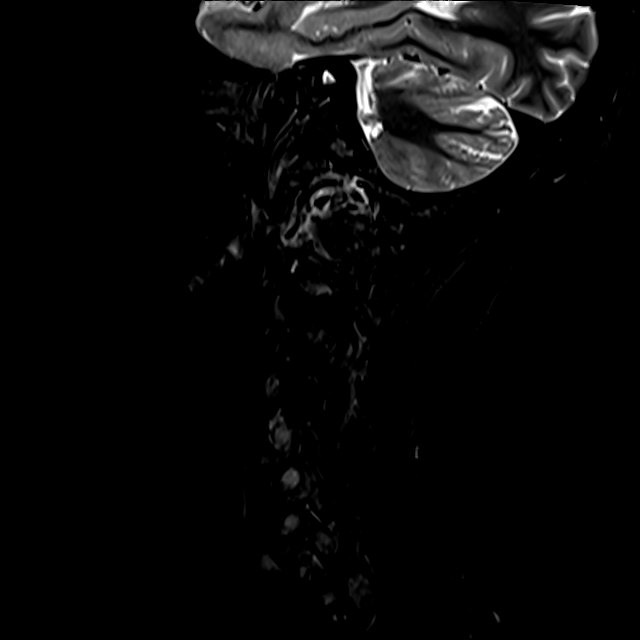
[im 3/16]
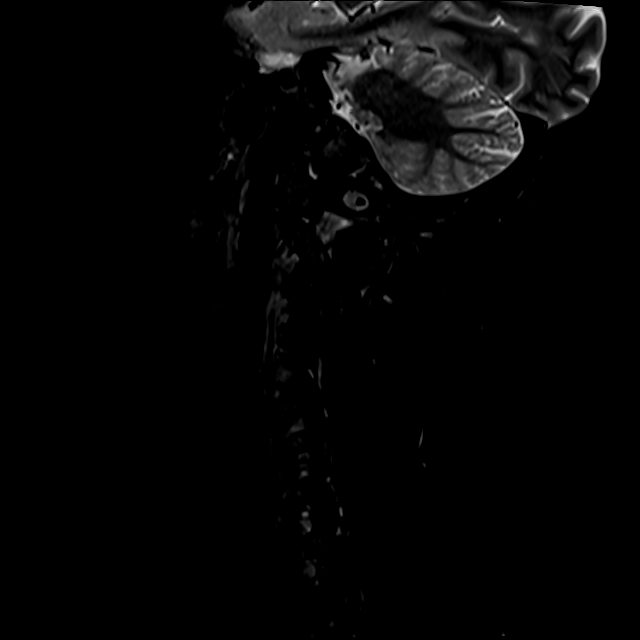
[im 6/16]
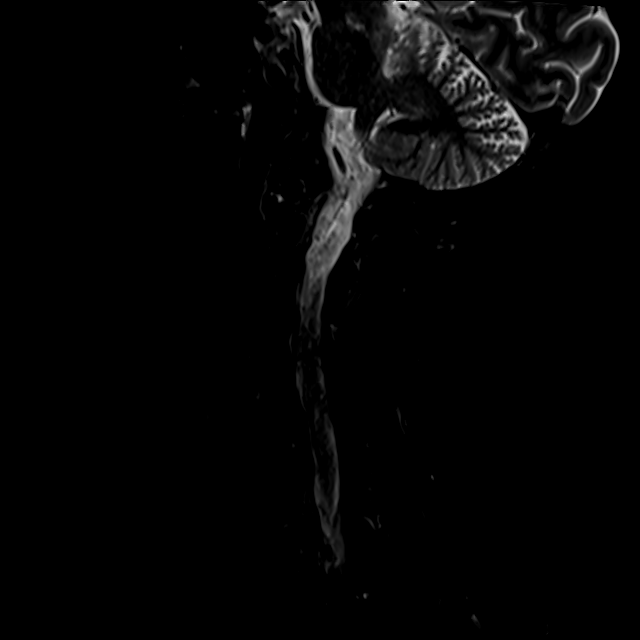
[im 8/16]
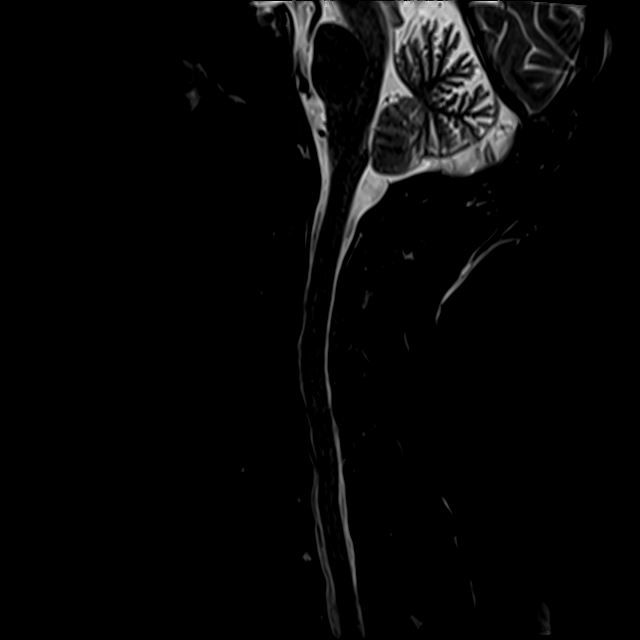
[im 11/16]
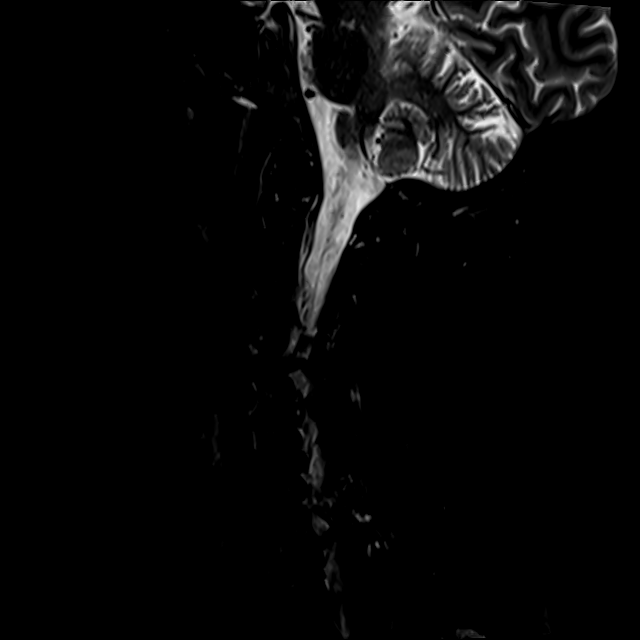
[im 13/16]
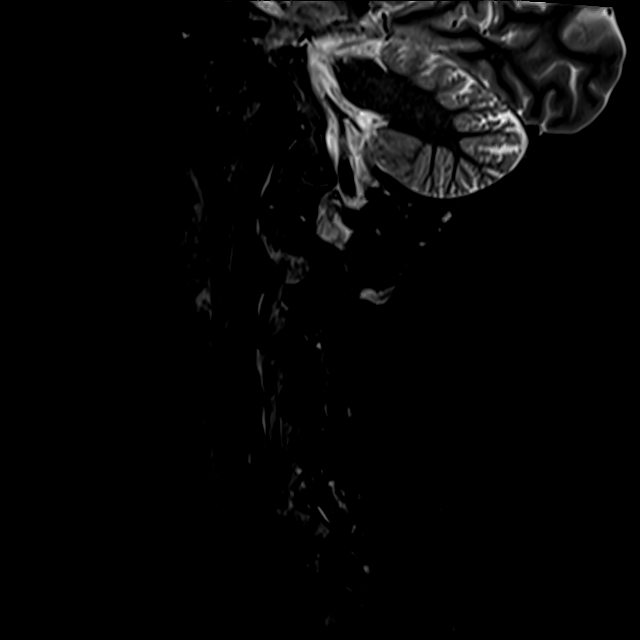

[Series 8: T2 · axial · 3.0mm · 0.50mm/px · z∈[-68,+37]mm · 8 of 34 slices shown (2 of 2)]
[im 1/34]
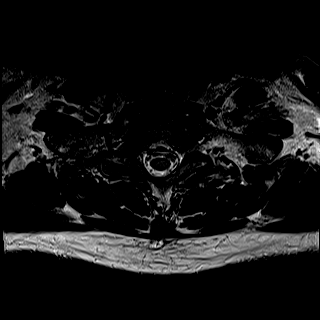
[im 6/34]
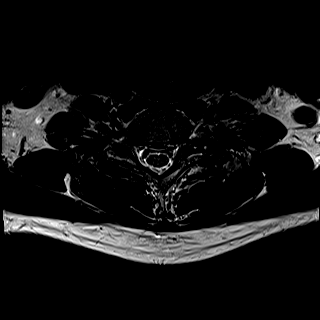
[im 11/34]
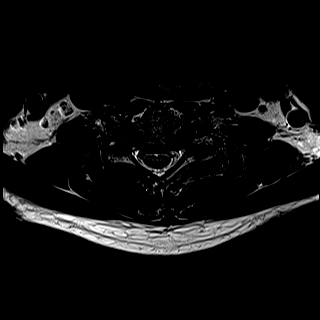
[im 16/34]
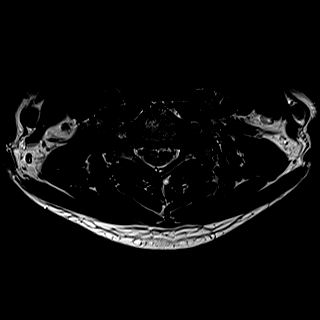
[im 18/34]
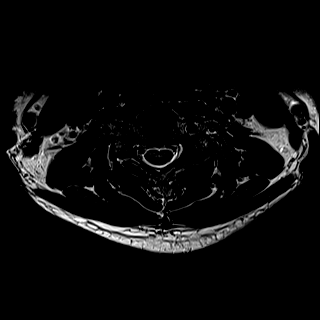
[im 23/34]
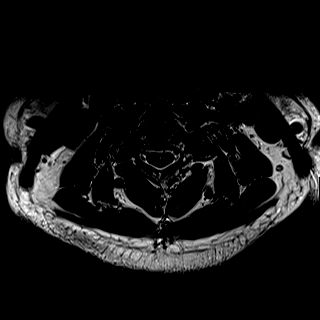
[im 28/34]
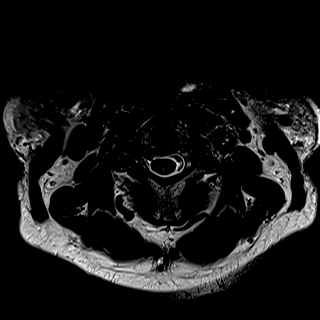
[im 34/34]
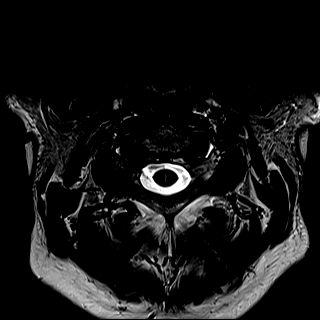

[27 of 48 positions shown; findings below may reference images not displayed]

FINDINGS: Alignment: Straightening of cervical lordosis. Subtle
anterolisthesis of C4 on C5 was more apparent in [REDACTED].

Vertebrae: Confluent degenerative appearing marrow edema in a
chronically degenerated left side C7 facet (series 7, image 14.
Patchy edema also in the adjacent left T1 facet (image 15).

No other marrow edema or acute osseous abnormality. Mildly
heterogeneous T1 marrow signal throughout the visible spine appears
to be degenerative in nature.

Cord: Normal. No cord signal abnormality despite borderline to mild
degenerative cord mass effect detailed below.

Posterior Fossa, vertebral arteries, paraspinal tissues:
Cervicomedullary junction is within normal limits. Negative visible
posterior fossa. Preserved major vascular flow voids in the neck.
The left vertebral artery appears dominant. Negative visible neck
soft tissues, lung apices.

Disc levels:

C2-C3:  Negative.

C3-C4:  Subtle disc bulging and endplate spurring. No stenosis.

C4-C5: Mild to moderate left facet hypertrophy. Mild to moderate
left C5 neural foraminal stenosis.

C5-C6: Mild circumferential disc bulge and endplate spurring.
Broad-based posterior component. Mild facet hypertrophy on the left.
Borderline to mild spinal stenosis. Moderate left and mild right C6
neural foraminal stenosis.

C6-C7: Disc space loss. Circumferential but mostly anterior disc
osteophyte complex. Mild-to-moderate bilateral C7 foraminal
stenosis.

C7-T1: Bulky degenerated left side facet with degenerative facet
joint fluid, small subchondral cysts (series 8, image 29) and the
marrow edema described above. Mild contralateral right facet
hypertrophy. Minimal disc bulging. Mild foraminal endplate spurring.
No spinal stenosis. Up to moderate left C8 foraminal stenosis. No
convincing right foraminal stenosis.

Partially visible upper thoracic facet hypertrophy with at least
mild bilateral T1 and T2 foraminal stenosis.
IMPRESSION: 1. Acute exacerbation of advanced chronic facet joint arthritis on
the left at C7-T1. Marrow edema and degenerative facet joint fluid
on that side. Associated up to moderate left C8 neural foraminal
stenosis.

2. Moderate facet degeneration at C4-C5, where mild
spondylolisthesis was better demonstrated on the RUDKIEWICZ radiographs.
Mild to moderate left C5 foraminal stenosis.

3. Borderline to mild multifactorial spinal stenosis at C5-C6
related to disc and endplate degeneration. Moderate left C6
foraminal stenosis.

## 2021-04-06 HISTORY — PX: CARPAL TUNNEL RELEASE: SHX101

## 2021-07-01 ENCOUNTER — Emergency Department (HOSPITAL_COMMUNITY): Payer: Medicare Other

## 2021-07-01 ENCOUNTER — Inpatient Hospital Stay (HOSPITAL_COMMUNITY)
Admission: EM | Admit: 2021-07-01 | Discharge: 2021-07-04 | DRG: 062 | Disposition: A | Discharge status: Rehabilitation Facility | Payer: Medicare Other | Attending: Internal Medicine | Admitting: Internal Medicine

## 2021-07-01 DIAGNOSIS — R29810 Facial weakness: Secondary | ICD-10-CM | POA: Diagnosis present

## 2021-07-01 DIAGNOSIS — R252 Cramp and spasm: Secondary | ICD-10-CM | POA: Diagnosis not present

## 2021-07-01 DIAGNOSIS — N400 Enlarged prostate without lower urinary tract symptoms: Secondary | ICD-10-CM | POA: Diagnosis present

## 2021-07-01 DIAGNOSIS — I69354 Hemiplegia and hemiparesis following cerebral infarction affecting left non-dominant side: Secondary | ICD-10-CM | POA: Diagnosis present

## 2021-07-01 DIAGNOSIS — D696 Thrombocytopenia, unspecified: Secondary | ICD-10-CM | POA: Diagnosis present

## 2021-07-01 DIAGNOSIS — R4781 Slurred speech: Secondary | ICD-10-CM | POA: Diagnosis present

## 2021-07-01 DIAGNOSIS — J309 Allergic rhinitis, unspecified: Secondary | ICD-10-CM | POA: Diagnosis present

## 2021-07-01 DIAGNOSIS — Z20822 Contact with and (suspected) exposure to covid-19: Secondary | ICD-10-CM | POA: Diagnosis present

## 2021-07-01 DIAGNOSIS — N401 Enlarged prostate with lower urinary tract symptoms: Secondary | ICD-10-CM | POA: Diagnosis present

## 2021-07-01 DIAGNOSIS — Z8249 Family history of ischemic heart disease and other diseases of the circulatory system: Secondary | ICD-10-CM

## 2021-07-01 DIAGNOSIS — I6381 Other cerebral infarction due to occlusion or stenosis of small artery: Principal | ICD-10-CM | POA: Diagnosis present

## 2021-07-01 DIAGNOSIS — I69392 Facial weakness following cerebral infarction: Secondary | ICD-10-CM | POA: Diagnosis not present

## 2021-07-01 DIAGNOSIS — Z634 Disappearance and death of family member: Secondary | ICD-10-CM

## 2021-07-01 DIAGNOSIS — R001 Bradycardia, unspecified: Secondary | ICD-10-CM | POA: Diagnosis present

## 2021-07-01 DIAGNOSIS — Z8042 Family history of malignant neoplasm of prostate: Secondary | ICD-10-CM

## 2021-07-01 DIAGNOSIS — I639 Cerebral infarction, unspecified: Secondary | ICD-10-CM | POA: Diagnosis present

## 2021-07-01 DIAGNOSIS — G8194 Hemiplegia, unspecified affecting left nondominant side: Secondary | ICD-10-CM | POA: Diagnosis present

## 2021-07-01 DIAGNOSIS — Z7982 Long term (current) use of aspirin: Secondary | ICD-10-CM

## 2021-07-01 DIAGNOSIS — Z91041 Radiographic dye allergy status: Secondary | ICD-10-CM | POA: Diagnosis not present

## 2021-07-01 DIAGNOSIS — N179 Acute kidney failure, unspecified: Secondary | ICD-10-CM | POA: Diagnosis not present

## 2021-07-01 DIAGNOSIS — R3911 Hesitancy of micturition: Secondary | ICD-10-CM | POA: Diagnosis not present

## 2021-07-01 DIAGNOSIS — I10 Essential (primary) hypertension: Secondary | ICD-10-CM | POA: Diagnosis present

## 2021-07-01 DIAGNOSIS — R29707 NIHSS score 7: Secondary | ICD-10-CM | POA: Diagnosis present

## 2021-07-01 DIAGNOSIS — R3915 Urgency of urination: Secondary | ICD-10-CM | POA: Diagnosis present

## 2021-07-01 DIAGNOSIS — E785 Hyperlipidemia, unspecified: Secondary | ICD-10-CM | POA: Diagnosis present

## 2021-07-01 DIAGNOSIS — Z87891 Personal history of nicotine dependence: Secondary | ICD-10-CM

## 2021-07-01 DIAGNOSIS — Z79899 Other long term (current) drug therapy: Secondary | ICD-10-CM

## 2021-07-01 DIAGNOSIS — I6389 Other cerebral infarction: Secondary | ICD-10-CM | POA: Diagnosis not present

## 2021-07-01 HISTORY — DX: Plantar fascial fibromatosis: M72.2

## 2021-07-01 LAB — DIFFERENTIAL
Abs Immature Granulocytes: 0 10*3/uL (ref 0.00–0.07)
Basophils Absolute: 0 10*3/uL (ref 0.0–0.1)
Basophils Relative: 0 %
Eosinophils Absolute: 0.2 10*3/uL (ref 0.0–0.5)
Eosinophils Relative: 5 %
Immature Granulocytes: 0 %
Lymphocytes Relative: 27 %
Lymphs Abs: 1.1 10*3/uL (ref 0.7–4.0)
Monocytes Absolute: 0.3 10*3/uL (ref 0.1–1.0)
Monocytes Relative: 8 %
Neutro Abs: 2.4 10*3/uL (ref 1.7–7.7)
Neutrophils Relative %: 60 %

## 2021-07-01 LAB — CBC
HCT: 37.3 % — ABNORMAL LOW (ref 39.0–52.0)
Hemoglobin: 12.7 g/dL — ABNORMAL LOW (ref 13.0–17.0)
MCH: 33.3 pg (ref 26.0–34.0)
MCHC: 34 g/dL (ref 30.0–36.0)
MCV: 97.9 fL (ref 80.0–100.0)
Platelets: 120 10*3/uL — ABNORMAL LOW (ref 150–400)
RBC: 3.81 MIL/uL — ABNORMAL LOW (ref 4.22–5.81)
RDW: 12.7 % (ref 11.5–15.5)
WBC: 4 10*3/uL (ref 4.0–10.5)
nRBC: 0 % (ref 0.0–0.2)

## 2021-07-01 LAB — PROTIME-INR
INR: 0.9 (ref 0.8–1.2)
Prothrombin Time: 12.2 seconds (ref 11.4–15.2)

## 2021-07-01 LAB — COMPREHENSIVE METABOLIC PANEL
ALT: 24 U/L (ref 0–44)
AST: 22 U/L (ref 15–41)
Albumin: 3.6 g/dL (ref 3.5–5.0)
Alkaline Phosphatase: 47 U/L (ref 38–126)
Anion gap: 6 (ref 5–15)
BUN: 19 mg/dL (ref 8–23)
CO2: 20 mmol/L — ABNORMAL LOW (ref 22–32)
Calcium: 8.6 mg/dL — ABNORMAL LOW (ref 8.9–10.3)
Chloride: 113 mmol/L — ABNORMAL HIGH (ref 98–111)
Creatinine, Ser: 1.26 mg/dL — ABNORMAL HIGH (ref 0.61–1.24)
GFR, Estimated: >60 mL/min (ref >60)
Glucose, Bld: 108 mg/dL — ABNORMAL HIGH (ref 70–99)
Potassium: 3.9 mmol/L (ref 3.5–5.1)
Sodium: 139 mmol/L (ref 135–145)
Total Bilirubin: 0.4 mg/dL (ref 0.3–1.2)
Total Protein: 6.4 g/dL — ABNORMAL LOW (ref 6.5–8.1)

## 2021-07-01 LAB — APTT: aPTT: 24 seconds (ref 24–36)

## 2021-07-01 LAB — I-STAT CHEM 8, ED
BUN: 20 mg/dL (ref 8–23)
Calcium, Ion: 1.15 mmol/L (ref 1.15–1.40)
Chloride: 111 mmol/L (ref 98–111)
Creatinine, Ser: 1.2 mg/dL (ref 0.61–1.24)
Glucose, Bld: 104 mg/dL — ABNORMAL HIGH (ref 70–99)
HCT: 37 % — ABNORMAL LOW (ref 39.0–52.0)
Hemoglobin: 12.6 g/dL — ABNORMAL LOW (ref 13.0–17.0)
Potassium: 4 mmol/L (ref 3.5–5.1)
Sodium: 144 mmol/L (ref 135–145)
TCO2: 21 mmol/L — ABNORMAL LOW (ref 22–32)

## 2021-07-01 LAB — RESP PANEL BY RT-PCR (FLU A&B, COVID) ARPGX2
Influenza A by PCR: NEGATIVE
Influenza B by PCR: NEGATIVE
SARS Coronavirus 2 by RT PCR: NEGATIVE

## 2021-07-01 LAB — ETHANOL: Alcohol, Ethyl (B): <10 mg/dL (ref <10)

## 2021-07-01 LAB — CBG MONITORING, ED: Glucose-Capillary: 104 mg/dL — ABNORMAL HIGH (ref 70–99)

## 2021-07-01 IMAGING — CT CT HEAD CODE STROKE
4 series · 15 of 47 positions shown, 17 images · non-contrast
Comparison: None.

CLINICAL DATA: Code stroke.  Left-sided weakness and numbness.



[Series 2: head 5.0 st · axial · 0.45mm/px · z∈[-128,-8]mm · 7 of 32 slices shown, 9 images]
[im 4/32  brain]
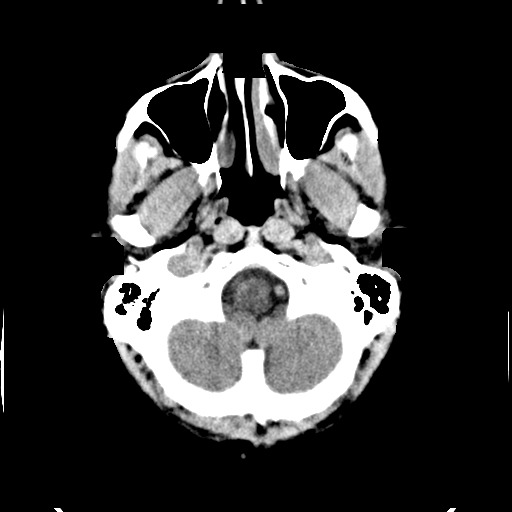
[im 4/32  bone]
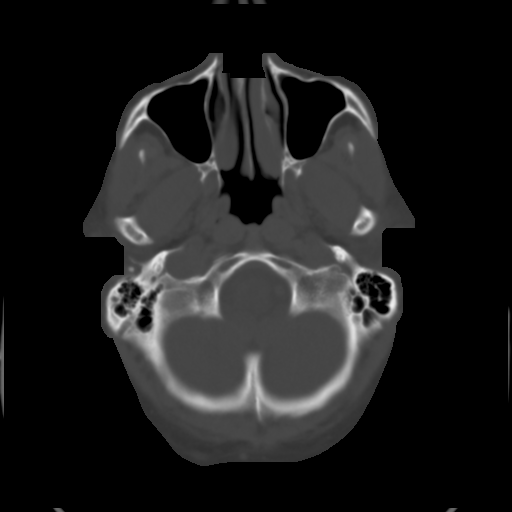
[im 8/32  brain]
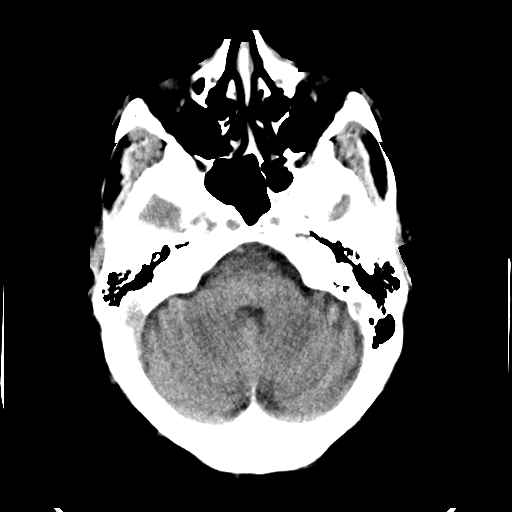
[im 12/32  brain]
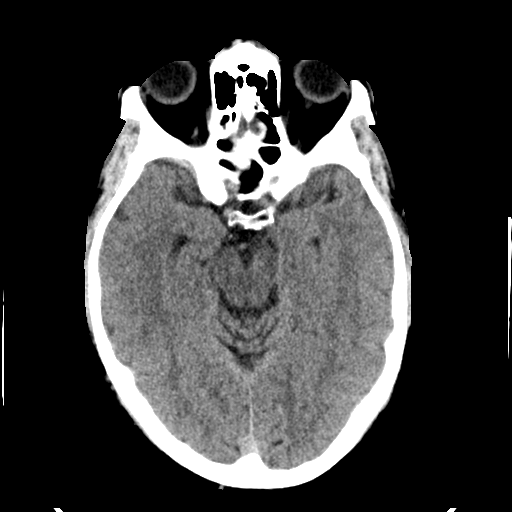
[im 16/32  brain]
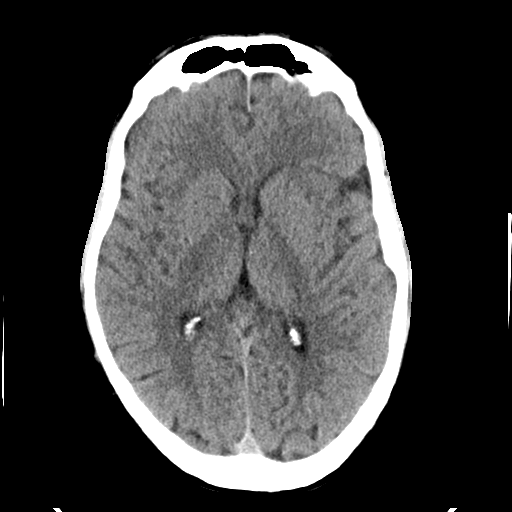
[im 20/32  brain]
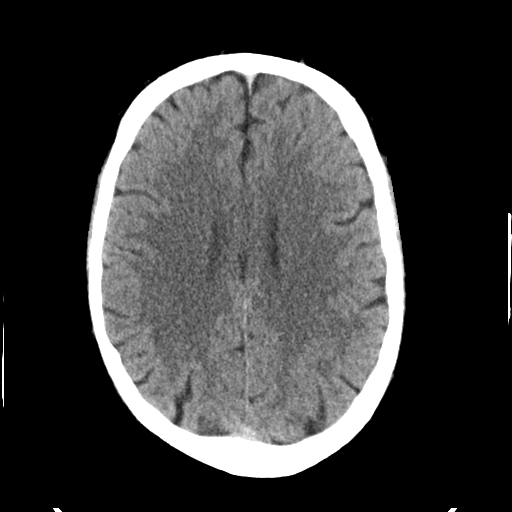
[im 20/32  bone]
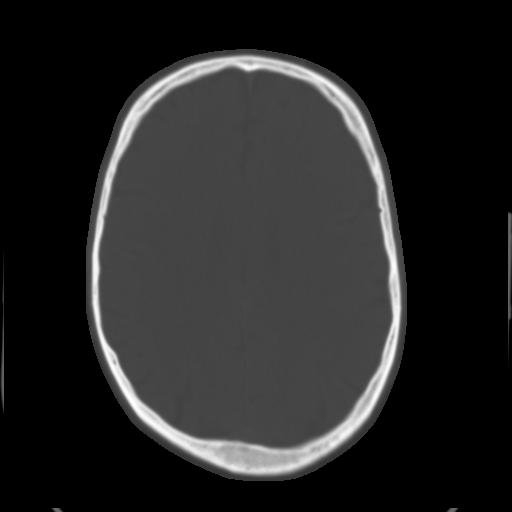
[im 24/32  brain]
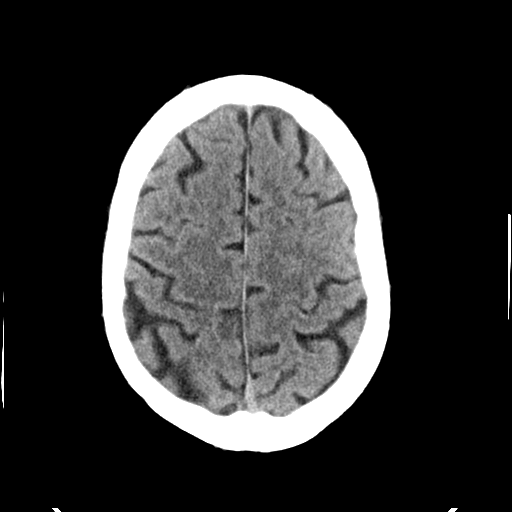
[im 28/32  brain]
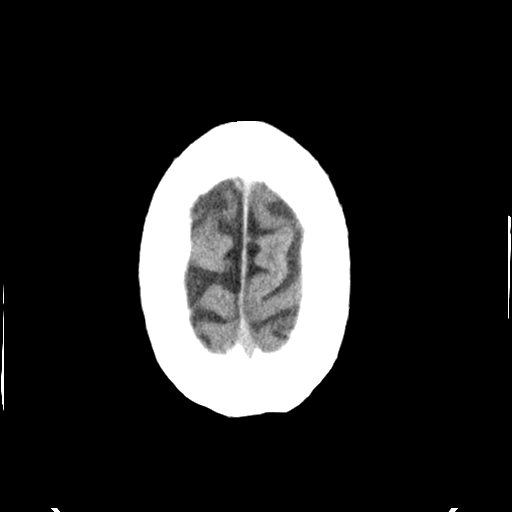

[Series 4: head 2.0 bone · axial · 0.45mm/px · z∈[-129,-113]mm · 2 of 80 slices shown]
[im 8/80  bone]
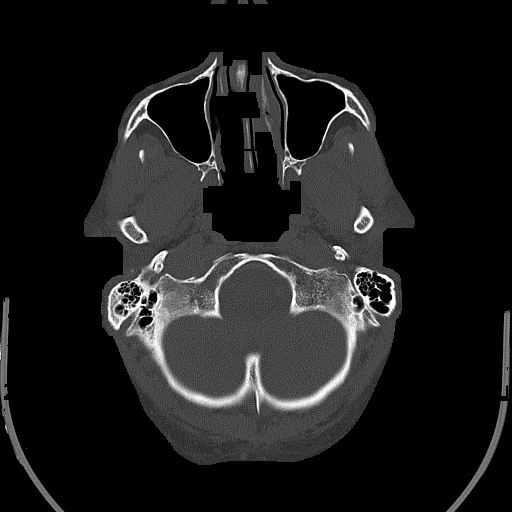
[im 16/80  bone]
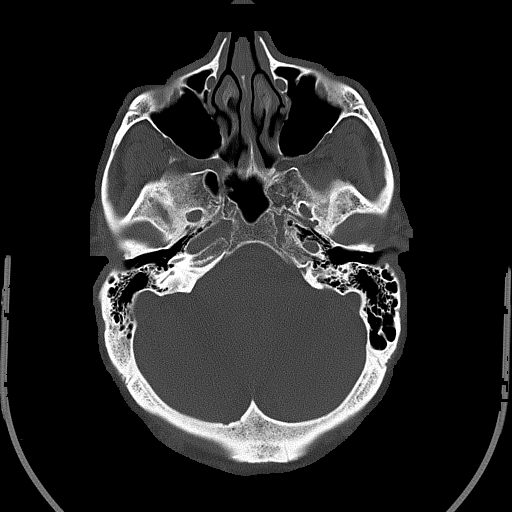

[Series 5: head 3.0 cor st · coronal · 0.28mm/px · 3 of 74 slices shown]
[im 25/74  brain]
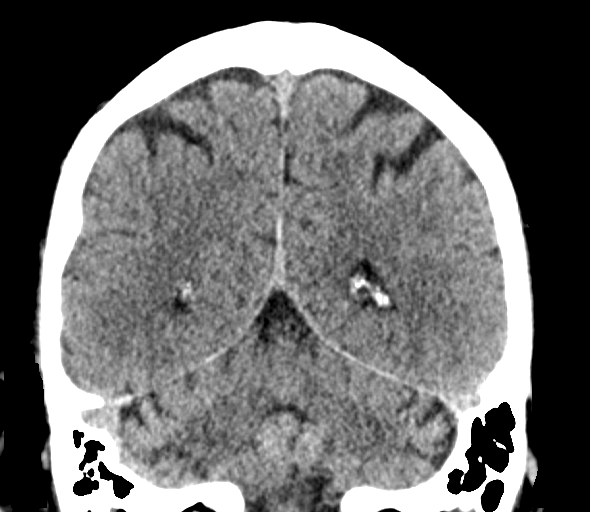
[im 33/74  brain]
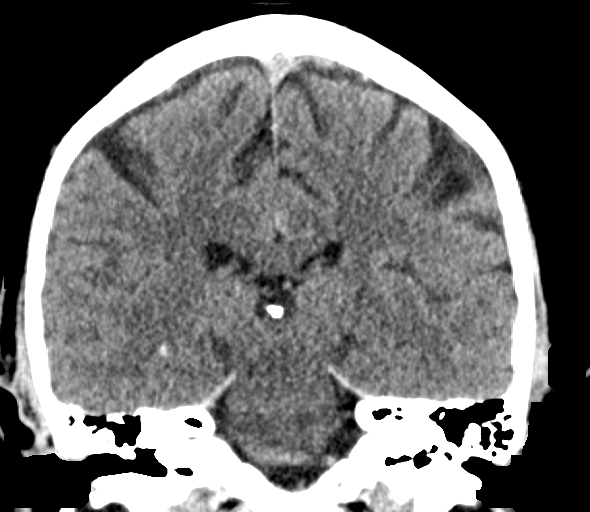
[im 41/74  brain]
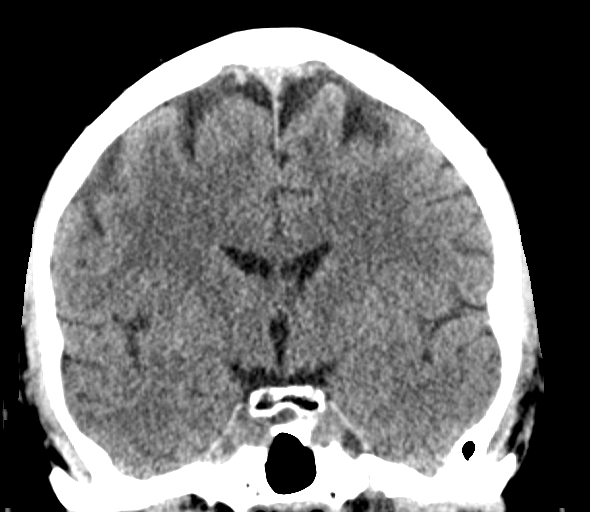

[Series 6: head 3.0 sag st · sagittal · 0.27mm/px · 3 of 57 slices shown]
[im 19/57  brain]
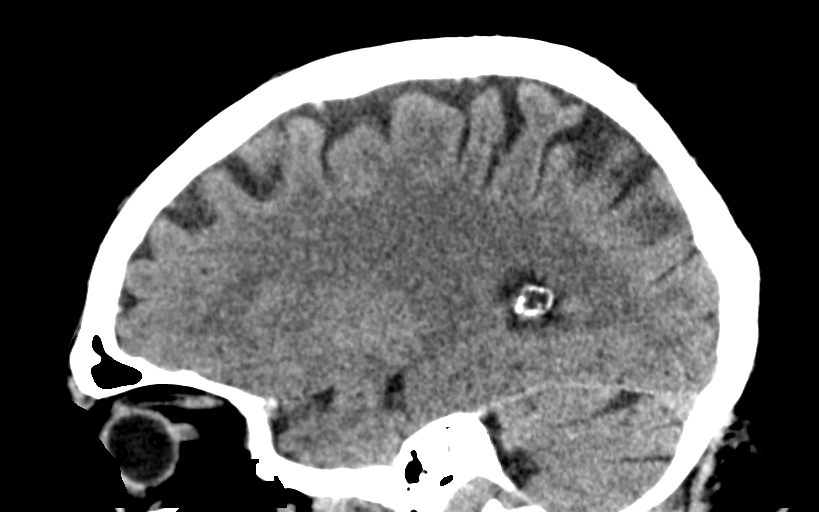
[im 29/57  brain]
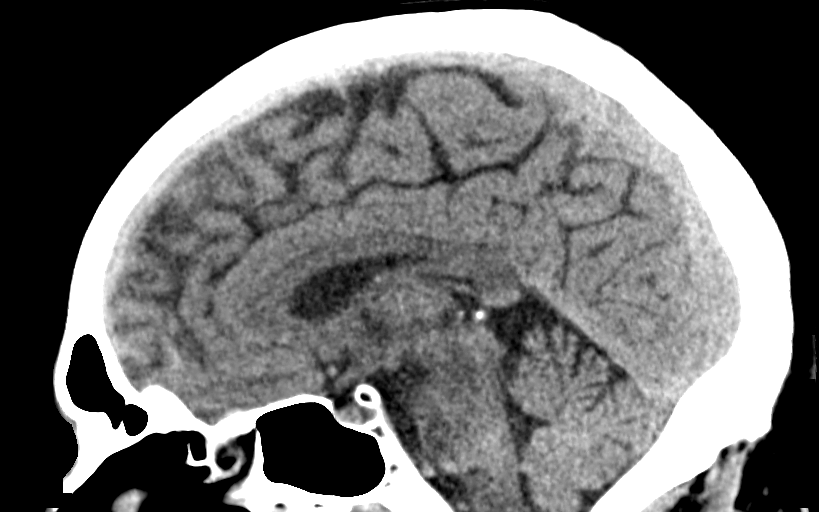
[im 38/57  brain]
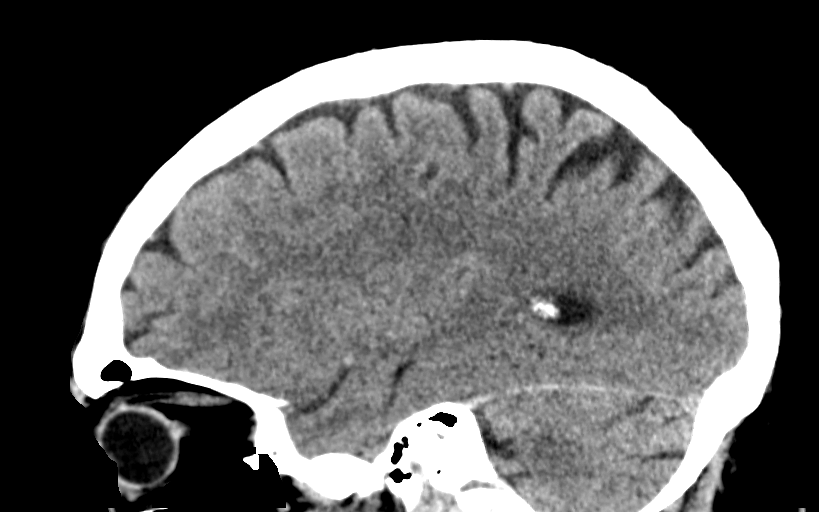

[15 of 47 positions shown; findings below may reference images not displayed]

FINDINGS: Brain: No evidence of acute infarction, hemorrhage, cerebral edema,
mass, mass effect, or midline shift. Ventricles and sulci are normal
for age. No extra-axial fluid collection.

Vascular: No hyperdense vessel or unexpected calcification.

Skull: Normal. Negative for fracture or focal lesion.

Sinuses/Orbits: No acute finding.

Other: The mastoid air cells are well aerated.

ASPECTS (Alberta Stroke Program Early CT Score)

- Ganglionic level infarction (caudate, lentiform nuclei, internal
capsule, insula, M1-M3 cortex): 7

- Supraganglionic infarction (M4-M6 cortex): 3

Total score (0-10 with 10 being normal): 10
IMPRESSION: 1. No acute intracranial process.
2. ASPECTS is 10

pm to provider Dr. SEDN DEOG via secure text paging.

## 2021-07-01 MED ORDER — ACETAMINOPHEN 650 MG RE SUPP: 650.0000 mg | RECTAL | Status: DC | PRN

## 2021-07-01 MED ORDER — CLEVIDIPINE BUTYRATE 0.5 MG/ML IV EMUL: 0.0000 mg/h | INTRAVENOUS | Status: DC

## 2021-07-01 MED ORDER — TENECTEPLASE FOR STROKE
0.2500 mg/kg | PACK | Freq: Once | INTRAVENOUS | Status: AC
  Administered 2021-07-01: 23 mg via INTRAVENOUS
  Filled 2021-07-01: qty 10

## 2021-07-01 MED ORDER — SODIUM CHLORIDE 0.9 % IV SOLN: INTRAVENOUS | Status: DC

## 2021-07-01 MED ORDER — STROKE: EARLY STAGES OF RECOVERY BOOK
Freq: Once | Status: AC
  Filled 2021-07-01: qty 1

## 2021-07-01 MED ORDER — ACETAMINOPHEN 160 MG/5ML PO SOLN: 650.0000 mg | ORAL | Status: DC | PRN

## 2021-07-01 MED ORDER — LABETALOL HCL 5 MG/ML IV SOLN: 20.0000 mg | Freq: Once | INTRAVENOUS | Status: DC

## 2021-07-01 MED ORDER — PANTOPRAZOLE SODIUM 40 MG IV SOLR
40.0000 mg | Freq: Every day | INTRAVENOUS | Status: DC
  Administered 2021-07-02: 40 mg via INTRAVENOUS
  Filled 2021-07-01: qty 10

## 2021-07-01 MED ORDER — ACETAMINOPHEN 325 MG PO TABS
650.0000 mg | ORAL_TABLET | ORAL | Status: DC | PRN
Start: 2021-07-01 — End: 2021-07-04

## 2021-07-01 MED ORDER — CHLORHEXIDINE GLUCONATE CLOTH 2 % EX PADS
6.0000 | MEDICATED_PAD | Freq: Every day | CUTANEOUS | Status: DC
  Administered 2021-07-02 – 2021-07-04 (×3): 6 via TOPICAL

## 2021-07-01 MED ORDER — SENNOSIDES-DOCUSATE SODIUM 8.6-50 MG PO TABS: 1.0000 | ORAL_TABLET | Freq: Every evening | ORAL | Status: DC | PRN

## 2021-07-01 NOTE — Code Documentation (Addendum)
Stroke Response Nurse Documentation Code Documentation  Eddie Calhoun is a 71 y.o. male arriving to [facility] ED via [facility] on 2/25 with past medical hx of HTN, thrombocytopenia, BPH. On ASA 81mg . Code stroke was activated by EMS.   Patient from home where he was LKW at 2100 and now complaining of left sided weakness and numbness.   Stroke team at the bedside on patient arrival. Labs drawn and patient cleared for CT by Dr. Roslynn Amble. Patient to CT with team. NIHSS 7, see documentation for details and code stroke times. Patient with left arm weakness, left leg weakness, and left decreased sensation on exam. The following imaging was completed:  CT. Patient is a candidate for IV Thrombolytic due to fixed neurological deficit. Patient is not a candidate for IR due to No LVO.   Care/Plan: TNK. Admit to ICU.   Bedside handoff with ED RN Sophia.    Madelynn Done  Rapid Response RN

## 2021-07-01 NOTE — Progress Notes (Signed)
PHARMACIST CODE STROKE RESPONSE  Notified to mix TNK at 2225 by Dr. Rory Percy Delivered TNK to RN at 2227  TNK dose = 23 mg IV over 5 seconds  Issues/delays encountered (if applicable): None  Narda Bonds, PharmD, [place] Pharmacist Phone: [phone]

## 2021-07-01 NOTE — H&P (Signed)
Neurology H&P  CC: Left-sided weakness  History is obtained from: Patient, chart  HPI: Eddie Calhoun is a 71 y.o. male has medical history of hypertension, recent bilateral carpal tunnel surgeries, BPH, seasonal allergies, was in usual state of health last known well at 9 PM today when he had a sudden onset of left-sided heaviness involving the face arm and leg.  This progressed to complete inability to raise the left arm or leg.  EMS was called.  They noted some left-sided facial asymmetry along with significant left arm and leg weakness for which a code stroke was activated. He was evaluated on the bridge emergently and noted to have significant left hemiparesis. Noncontrast head CT was obtained which was negative for acute process. Given the recent carpal tunnel surgery, risks and benefits for IV TNKase was discussed in detail and patient agreed to proceed with IV TNKase which was administered at 2227 hrs.   LKW: 9 PM today IV thrombolysis given?:  Yes Premorbid modified Rankin scale (mRS):0  ROS: Full ROS was performed and is negative except as noted in the HPI. Past Medical History:  Diagnosis Date   Allergy    allergic rhinitis   BPH (benign prostatic hyperplasia)    Elevated prostate specific antigen (PSA)    Hypertension    Pneumonia    Thrombocytopenia, unspecified ([facility]) 07/26/2012   03/24/12  129,000!    Family History  Problem Relation Age of Onset   Hypertension Mother    Hypertension Father    Cancer Father 44       father died with hx of prostate cancer     Social History:   reports that he quit smoking about 33 years ago. His smoking use included cigarettes. He has a 40.00 pack-year smoking history. He has never used smokeless tobacco. He reports that he does not drink alcohol. No history on file for drug use.  Medications  Current Facility-Administered Medications:     stroke: mapping our early stages of recovery book, , Does not apply, Once, Amie Portland, MD   0.9 %  sodium chloride infusion, , Intravenous, Continuous, Amie Portland, MD   acetaminophen (TYLENOL) tablet 650 mg, 650 mg, Oral, Q4H PRN **OR** acetaminophen (TYLENOL) 160 MG/5ML solution 650 mg, 650 mg, Per Tube, Q4H PRN **OR** acetaminophen (TYLENOL) suppository 650 mg, 650 mg, Rectal, Q4H PRN, Amie Portland, MD   labetalol (NORMODYNE) injection 20 mg, 20 mg, Intravenous, Once **AND** clevidipine (CLEVIPREX) infusion 0.5 mg/mL, 0-21 mg/hr, Intravenous, Continuous, Amie Portland, MD   pantoprazole (PROTONIX) injection 40 mg, 40 mg, Intravenous, QHS, Amie Portland, MD   senna-docusate (Senokot-S) tablet 1 tablet, 1 tablet, Oral, QHS PRN, Amie Portland, MD  Current Outpatient Medications:    aspirin 81 MG EC tablet, Take 81 mg by mouth daily.  , Disp: , Rfl:    cyanocobalamin 2000 MCG tablet, Take 2,000 mcg by mouth daily., Disp: , Rfl:    Multiple Vitamin (MULTIVITAMIN) tablet, Take 1 tablet by mouth daily.  , Disp: , Rfl:    olmesartan (BENICAR) 40 MG tablet, Take 1 tablet (40 mg total) by mouth daily., Disp: 90 tablet, Rfl: 3   Exam: Current vital signs: BP 135/81    Pulse (!) 59    Temp 98.2 F (36.8 C)    Resp 16    Ht 5\' 10"  (1.778 m)    Wt 93.4 kg    SpO2 97%    BMI 29.54 kg/m  Vital signs in last 24 hours: Temp:  [  98.2 F (36.8 C)-98.6 F (37 C)] 98.2 F (36.8 C) (02/25 2257) Pulse Rate:  [57-62] 59 (02/25 2300) Resp:  [12-16] 16 (02/25 2300) BP: (131-139)/(81-86) 135/81 (02/25 2300) SpO2:  [97 %-99 %] 97 % (02/25 2300) Weight:  [93.4 kg] 93.4 kg (02/25 2252) General: Awake alert in no distress HEENT: Cephalic atraumatic Lungs: Clear Cardiovascular: Regular rate rhythm Abdomen nondistended nontender Extremities warm well perfused Neurologic exam Awake alert oriented x3 No evidence of dysarthria or aphasia Cranial nerves: Pupils equal round react light, extract movements intact, visual fields full, face is completely symmetric although there was initial  report of left facial asymmetry, facial sensation is mildly diminished on the left, tongue and palate midline. Motor exam with barely 1/5 strength in the left upper and lower extremity.  Right side is full strength. Sensation also mildly diminished on the left without extinction. Coordination with no dysmetria on the right.  Unable to perform on the left. NIH stroke scale-7  Labs I have reviewed labs in epic and the results pertinent to this consultation are:   CBC    Component Value Date/Time   WBC 4.0 07/01/2021 2221   RBC 3.81 (L) 07/01/2021 2221   HGB 12.6 (L) 07/01/2021 2225   HGB 14.3 09/04/2012 0955   HCT 37.0 (L) 07/01/2021 2225   HCT 41.0 09/04/2012 0955   PLT 120 (L) 07/01/2021 2221   PLT 125 (L) 09/04/2012 0955   MCV 97.9 07/01/2021 2221   MCV 96.6 09/04/2012 0955   MCH 33.3 07/01/2021 2221   MCHC 34.0 07/01/2021 2221   RDW 12.7 07/01/2021 2221   RDW 12.4 09/04/2012 0955   LYMPHSABS 1.1 07/01/2021 2221   LYMPHSABS 0.8 (L) 09/04/2012 0955   MONOABS 0.3 07/01/2021 2221   MONOABS 0.3 09/04/2012 0955   EOSABS 0.2 07/01/2021 2221   EOSABS 0.2 09/04/2012 0955   BASOSABS 0.0 07/01/2021 2221   BASOSABS 0.0 09/04/2012 0955    CMP     Component Value Date/Time   NA 144 07/01/2021 2225   NA 139 09/04/2012 0955   K 4.0 07/01/2021 2225   K 5.0 09/04/2012 0955   CL 111 07/01/2021 2225   CL 105 09/04/2012 0955   CO2 20 (L) 07/01/2021 2221   CO2 26 09/04/2012 0955   GLUCOSE 104 (H) 07/01/2021 2225   GLUCOSE 103 (H) 09/04/2012 0955   BUN 20 07/01/2021 2225   BUN 30.7 (H) 09/04/2012 0955   CREATININE 1.20 07/01/2021 2225   CREATININE 1.4 (H) 09/04/2012 0955   CALCIUM 8.6 (L) 07/01/2021 2221   CALCIUM 9.4 09/04/2012 0955   PROT 6.4 (L) 07/01/2021 2221   PROT 7.4 09/04/2012 0955   ALBUMIN 3.6 07/01/2021 2221   ALBUMIN 3.7 09/04/2012 0955   AST 22 07/01/2021 2221   AST 23 09/04/2012 0955   ALT 24 07/01/2021 2221   ALT 30 09/04/2012 0955   ALKPHOS 47 07/01/2021  2221   ALKPHOS 66 09/04/2012 0955   BILITOT 0.4 07/01/2021 2221   BILITOT 0.34 09/04/2012 0955   GFRNONAA >60 07/01/2021 2221   Imaging I have reviewed the images obtained:  CT-head-aspects 10.  No bleed   Assessment:  71 year old man with sudden onset of left-sided sensorimotor deficits brought in for evaluation of acute strokelike symptoms. On my evaluation is significant left hemiparesis with mild left hemisensory loss. I suspect a small vessel etiology lacunar infarction as etiology of this stroke. Risk benefits of IV TNKase discussed after the CT head was obtained and personally reviewed by me  and was negative for bleed. Patient had recent bilateral carpal tunnel surgeries done. I did explain to him that he might be at high risk for bleeding and postsurgical sites but those were done in early part of February nearly 3 weeks ago and the benefits of TNKase might outweigh the risk. He agreed to proceed. Bolus IV TNKase administered 2227 hrs. Will be admitted to neuro ICU for further evaluation and monitoring post TNKase.  Allergic to IV contrast-no signs of LVO on exam hence emergent vessel imaging not performed.  CT head reviewed prior to TNK administration.  Risks and benefits of IV TNKase discussed in detail as above  Plan: Acute Ischemic Stroke Acuity: Acute Current Suspected Etiology: Small vessel disease Continue Evaluation:  -Admit to: Neurological ICU -Hold Aspirin until 24 hour post IV thrombolysis (tPA or TNKase) neuroimaging is stable and without evidence of bleeding -Blood pressure control, goal of SYS <180 -MRI/ECHO/A1C/Lipid panel. -Hyperglycemia management per SSI to maintain glucose 140-180mg /dL. -PT/OT/ST therapies and recommendations when able  CNS -Close neuro monitoring  Dysarthria Dysphagia following cerebral infarction  -N.p.o. until clears bedside swallow screen -May need formal swallow evaluation   Hemiplegia and hemiparesis following cerebral  infarction affecting left non-dominant side  -PT/OT -PM&R consult No active issues Monitor clinically  RESP Acute Respiratory Failure  -vent management per ICU -wean when able  CV History of essential hypertension Blood pressure goal and parameters as above. -TTE  Hyperlipidemia, unspecified  - Statin for goal LDL < 70   HEME Anemia likely iron deficiency Continue to monitor CBC Platelet count 120 Keep a close eye with next CBC.  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  GFR greater than 60. Gentle hydration Check BMP in the morning   Fluid/Electrolyte Disorders No electrolyte abnormalities Check BMP in the morning  Prophylaxis DVT: SCD only-no antiplatelets or anticoagulants GI: PPI Bowel: Docusate senna  Diet: NPO until cleared by speech  Code Status: Full Code    THE FOLLOWING WERE PRESENT ON ADMISSION: Recent bilateral carpal tunnel surgery Acute ischemic stroke Chronic thrombocytopenia  CRITICAL CARE ATTESTATION Performed by: Amie Portland, MD Total critical care time: 45 minutes Critical care time was exclusive of separately billable procedures and treating other patients and/or supervising APPs/Residents/Students Critical care was necessary to treat or prevent imminent or life-threatening deterioration due to acute ischemic stroke, IV thrombolysis administration This patient is critically ill and at significant risk for neurological worsening and/or death and care requires constant monitoring. Critical care was time spent personally by me on the following activities: development of treatment plan with patient and/or surrogate as well as nursing, discussions with consultants, evaluation of patient's response to treatment, examination of patient, obtaining history from patient or surrogate, ordering and  performing treatments and interventions, ordering and review of laboratory studies, ordering and review of radiographic studies, pulse oximetry, re-evaluation of patient's condition, participation in multidisciplinary rounds and medical decision making of high complexity in the care of this patient.   -- Amie Portland, MD Neurologist [facility] Pager: [phone]

## 2021-07-01 NOTE — ED Provider Notes (Signed)
[facility] EMERGENCY DEPARTMENT Provider Note   CSN: [id number] Arrival date & time: 07/01/21  2218  An emergency department physician performed an initial assessment on this suspected stroke patient at 2220.  History  Chief Complaint  Patient presents with   Code Stroke    Eddie Calhoun is a 71 y.o. male.  Presented to the emergency department with concern for left sided weakness.  Around 9:05 PM this evening patient told his wife that he felt like his left arm and leg were weak.  Wife noted slurred speech.  History obtained from EMS report, last known well 2100.  Sudden onset left-sided weakness, slurred speech.  History also obtained from wife and patient.    HPI     Home Medications Prior to Admission medications   Medication Sig Start Date End Date Taking? Authorizing Provider  aspirin 81 MG EC tablet Take 81 mg by mouth daily.      [provider]  cyanocobalamin 2000 MCG tablet Take 2,000 mcg by mouth daily.    [provider]  Multiple Vitamin (MULTIVITAMIN) tablet Take 1 tablet by mouth daily.      [provider]  olmesartan (BENICAR) 40 MG tablet Take 1 tablet (40 mg total) by mouth daily. 09/25/10   Elby Showers, MD      Allergies    Contrast media [iodinated contrast media]    Review of Systems   Review of Systems  Unable to perform ROS: Acuity of condition  Neurological:  Positive for weakness and numbness.   Physical Exam Updated Vital Signs BP 122/84    Pulse (!) 51    Temp 97.6 F (36.4 C)    Resp 16    Ht 5\' 10"  (1.778 m)    Wt 93.4 kg    SpO2 97%    BMI 29.54 kg/m  Physical Exam Vitals and nursing note reviewed.  Constitutional:      General: He is not in acute distress.    Appearance: He is well-developed.  HENT:     Head: Normocephalic and atraumatic.  Eyes:     Conjunctiva/sclera: Conjunctivae normal.  Cardiovascular:     Rate and Rhythm: Normal rate and regular rhythm.     Heart sounds:  No murmur heard. Pulmonary:     Effort: Pulmonary effort is normal. No respiratory distress.     Breath sounds: Normal breath sounds.  Abdominal:     Palpations: Abdomen is soft.     Tenderness: There is no abdominal tenderness.  Musculoskeletal:        General: No swelling or deformity.     Cervical back: Neck supple.  Skin:    General: Skin is warm and dry.     Capillary Refill: Capillary refill takes less than 2 seconds.  Neurological:     Mental Status: He is alert.     Comments: AAOx3 CN 2-12 intact, speech clear visual fields intact 1/5 LUE, LLE 5/5 RUE, RLE Sensation decreased in LLE and LUE   Psychiatric:        Mood and Affect: Mood normal.    ED Results / Procedures / Treatments   Labs (all labs ordered are listed, but only abnormal results are displayed) Labs Reviewed  CBC - Abnormal; Notable for the following components:      Result Value   RBC 3.81 (*)    Hemoglobin 12.7 (*)    HCT 37.3 (*)    Platelets 120 (*)    All other  components within normal limits  COMPREHENSIVE METABOLIC PANEL - Abnormal; Notable for the following components:   Chloride 113 (*)    CO2 20 (*)    Glucose, Bld 108 (*)    Creatinine, Ser 1.26 (*)    Calcium 8.6 (*)    Total Protein 6.4 (*)    All other components within normal limits  I-STAT CHEM 8, ED - Abnormal; Notable for the following components:   Glucose, Bld 104 (*)    TCO2 21 (*)    Hemoglobin 12.6 (*)    HCT 37.0 (*)    All other components within normal limits  CBG MONITORING, ED - Abnormal; Notable for the following components:   Glucose-Capillary 104 (*)    All other components within normal limits  RESP PANEL BY RT-PCR (FLU A&B, COVID) ARPGX2  ETHANOL  PROTIME-INR  APTT  DIFFERENTIAL  RAPID URINE DRUG SCREEN, HOSP PERFORMED  URINALYSIS, ROUTINE W REFLEX MICROSCOPIC  HIV ANTIBODY (ROUTINE TESTING W REFLEX)  HEMOGLOBIN A1C  LIPID PANEL    EKG EKG Interpretation  Date/Time:  Saturday July 01 2021  22:36:03 EST Ventricular Rate:  63 PR Interval:  181 QRS Duration: 113 QT Interval:  407 QTC Calculation: 417 R Axis:   36 Text Interpretation: Sinus rhythm Borderline intraventricular conduction delay Low voltage, precordial leads Abnormal R-wave progression, early transition Confirmed by Madalyn Rob [phone]) on 07/01/2021 11:26:52 PM  Radiology CT HEAD CODE STROKE WO CONTRAST  Result Date: 07/01/2021 CLINICAL DATA:  Code stroke.  Left-sided weakness and numbness. EXAM: CT HEAD WITHOUT CONTRAST TECHNIQUE: Contiguous axial images were obtained from the base of the skull through the vertex without intravenous contrast. RADIATION DOSE REDUCTION: This exam was performed according to the departmental dose-optimization program which includes automated exposure control, adjustment of the mA and/or kV according to patient size and/or use of iterative reconstruction technique. COMPARISON:  None. FINDINGS: Brain: No evidence of acute infarction, hemorrhage, cerebral edema, mass, mass effect, or midline shift. Ventricles and sulci are normal for age. No extra-axial fluid collection. Vascular: No hyperdense vessel or unexpected calcification. Skull: Normal. Negative for fracture or focal lesion. Sinuses/Orbits: No acute finding. Other: The mastoid air cells are well aerated. ASPECTS [facility] Stroke Program Early CT Score) - Ganglionic level infarction (caudate, lentiform nuclei, internal capsule, insula, M1-M3 cortex): 7 - Supraganglionic infarction (M4-M6 cortex): 3 Total score (0-10 with 10 being normal): 10 IMPRESSION: 1. No acute intracranial process. 2. ASPECTS is 10 Code stroke imaging results were communicated on 07/01/2021 at 10:27 pm to provider Dr. Rory Percy via secure text paging. Electronically Signed   By: Merilyn Baba M.D.   On: 07/01/2021 22:27    Procedures .Critical Care Performed by: Lucrezia Starch, MD Authorized by: Lucrezia Starch, MD   Critical care provider statement:    Critical  care time (minutes):  37   Critical care was necessary to treat or prevent imminent or life-threatening deterioration of the following conditions:  CNS failure or compromise   Critical care was time spent personally by me on the following activities:  Development of treatment plan with patient or surrogate, discussions with consultants, evaluation of patient's response to treatment, examination of patient, ordering and review of laboratory studies, ordering and review of radiographic studies, ordering and performing treatments and interventions, pulse oximetry, re-evaluation of patient's condition and review of old charts    Medications Ordered in ED Medications   stroke: mapping our early stages of recovery book (has no administration in time range)  0.9 %  sodium chloride infusion (has no administration in time range)  acetaminophen (TYLENOL) tablet 650 mg (has no administration in time range)    Or  acetaminophen (TYLENOL) 160 MG/5ML solution 650 mg (has no administration in time range)    Or  acetaminophen (TYLENOL) suppository 650 mg (has no administration in time range)  senna-docusate (Senokot-S) tablet 1 tablet (has no administration in time range)  pantoprazole (PROTONIX) injection 40 mg (has no administration in time range)  labetalol (NORMODYNE) injection 20 mg (has no administration in time range)    And  clevidipine (CLEVIPREX) infusion 0.5 mg/mL (has no administration in time range)  tenecteplase (TNKASE) injection for Stroke 23 mg (23 mg Intravenous Given 07/01/21 2227)    ED Course/ Medical Decision Making/ A&P                           Medical Decision Making Amount and/or Complexity of Data Reviewed Labs: ordered. Radiology: ordered.  Risk Decision regarding hospitalization.   71 year old male with concern for left-sided weakness.  Code stroke.  Neuro eval and eval by myself on arrival.  Appears to be having acute stroke.  Taken immediately to CT.  Negative head CT,  well within timeframe, no contraindications, given TNK per neurology, neuro had the risks/benefit discussion regarding the tnk.  Will be admitted to Dr. Malen Gauze service for further management.  On reassessment while still in ER his symptoms are improving after receiving the thrombolytics.  Vitals are stable.  Updated wife at bedside.  History obtained from patient, EMS report and wife.  Additional history obtained from chart review, last note in epic is from podiatry.  Last saw cardiology in 2018 has history of hypertension.  Very remote history of smoking but not current smoker.  I independently reviewed CT head imaging.       Final Clinical Impression(s) / ED Diagnoses Final diagnoses:  Cerebrovascular accident (CVA), unspecified mechanism ([place])    Rx / DC Orders ED Discharge Orders     None         Lucrezia Starch, MD 07/01/21 [phone]

## 2021-07-01 NOTE — ED Notes (Signed)
Unable to perform CT with contrast due to pt's contrast allergy.

## 2021-07-01 NOTE — ED Triage Notes (Signed)
Pt bib EMS code stroke, LKW 2100. Laying in bed with his wife and developed L-sided numbness and weakness, unsteady gait, and slurred speech (slurring has since resolved on arrival to ED).  EMS vitals: 134/84 HR 64 CBG 134 97% room air Bilateral 18G in antecubital veins

## 2021-07-02 ENCOUNTER — Inpatient Hospital Stay (HOSPITAL_COMMUNITY): Payer: Medicare Other

## 2021-07-02 ENCOUNTER — Other Ambulatory Visit: Payer: Self-pay

## 2021-07-02 DIAGNOSIS — I6389 Other cerebral infarction: Secondary | ICD-10-CM | POA: Diagnosis not present

## 2021-07-02 LAB — ECHOCARDIOGRAM COMPLETE
AR max vel: 2.74 cm2
AV Area VTI: 2.84 cm2
AV Area mean vel: 2.74 cm2
AV Mean grad: 5 mmHg
AV Peak grad: 9.9 mmHg
Ao pk vel: 1.57 m/s
Area-P 1/2: 2.99 cm2
Calc EF: 63.6 %
Height: 70 in
S' Lateral: 2.4 cm
Single Plane A2C EF: 56.6 %
Single Plane A4C EF: 69.2 %
Weight: 3294.55 oz

## 2021-07-02 LAB — HEMOGLOBIN A1C
Hgb A1c MFr Bld: 5.5 % (ref 4.8–5.6)
Mean Plasma Glucose: 111.15 mg/dL

## 2021-07-02 LAB — LIPID PANEL
Cholesterol: 136 mg/dL (ref 0–200)
HDL: 44 mg/dL (ref >40)
LDL Cholesterol: 79 mg/dL (ref 0–99)
Total CHOL/HDL Ratio: 3.1 RATIO
Triglycerides: 65 mg/dL (ref <150)
VLDL: 13 mg/dL (ref 0–40)

## 2021-07-02 LAB — HIV ANTIBODY (ROUTINE TESTING W REFLEX): HIV Screen 4th Generation wRfx: NONREACTIVE

## 2021-07-02 LAB — MRSA NEXT GEN BY PCR, NASAL: MRSA by PCR Next Gen: NOT DETECTED

## 2021-07-02 IMAGING — MR MR HEAD W/O CM
10 of 11 series · 43 of 48 positions shown · IV contrast (gadavist)
Comparison: Head CT from yesterday

CLINICAL DATA: Stroke follow-up

EXAM:
MRI HEAD WITHOUT CONTRAST
MRA HEAD WITHOUT CONTRAST
MRA OF THE NECK WITHOUT AND WITH CONTRAST
TECHNIQUE: Multiplanar, multi-echo pulse sequences of the brain and surrounding
structures were acquired without intravenous contrast. Angiographic
images of the Circle of Willis were acquired using MRA technique
without intravenous contrast. Angiographic images of the neck were
acquired using MRA technique without and with intravenous contrast.
Carotid stenosis measurements (when applicable) are obtained
utilizing NASCET criteria, using the distal internal carotid
diameter as the denominator.
CONTRAST:  9mL GADAVIST GADOBUTROL 1 MMOL/ML IV SOLN

[Series 5: DWI · axial · 3.0mm · 0.88mm/px · z∈[-39,+108]mm · 10 of 100 slices shown (1 of 4)]
[im 1/100]
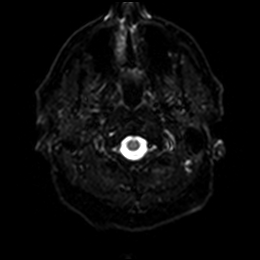
[im 12/100]
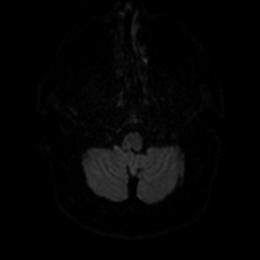
[im 23/100]
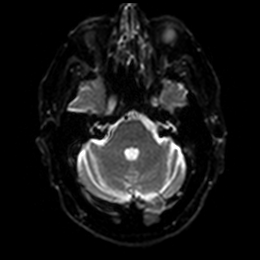
[im 34/100]
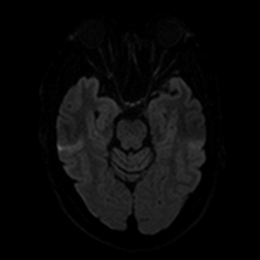
[im 45/100]
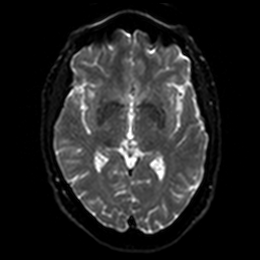
[im 56/100]
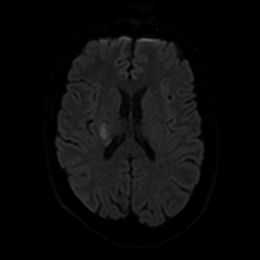
[im 67/100]
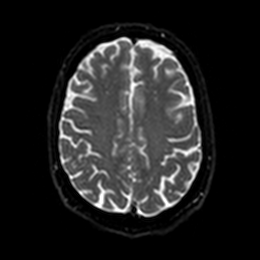
[im 78/100]
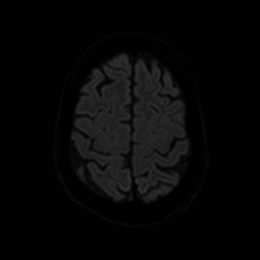
[im 89/100]
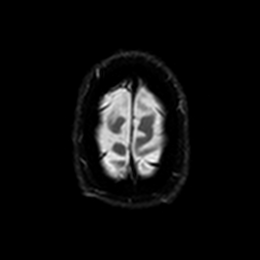
[im 100/100]
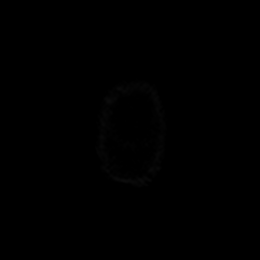

[Series 6: DWI · axial · 3.0mm · 0.88mm/px · z∈[-39,+108]mm · 5 of 50 slices shown (2 of 4)]
[im 1/50]
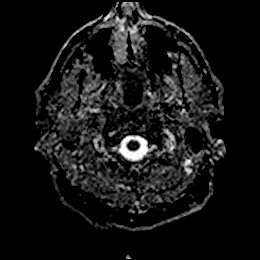
[im 13/50]
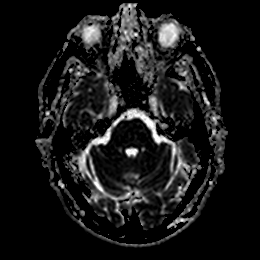
[im 25/50]
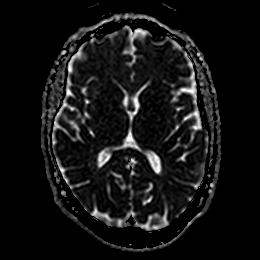
[im 37/50]
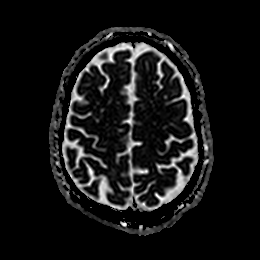
[im 50/50]
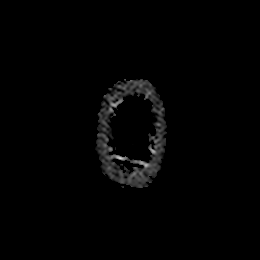

[Series 7: DWI · coronal · 4.0mm · 0.88mm/px · 6 of 66 slices shown (3 of 4)]
[im 1/66]
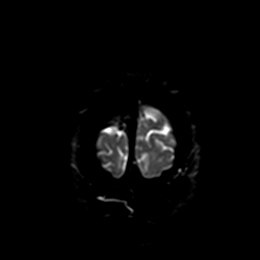
[im 14/66]
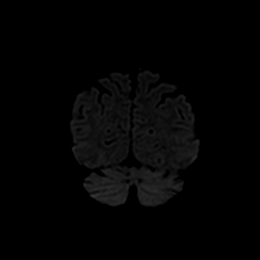
[im 27/66]
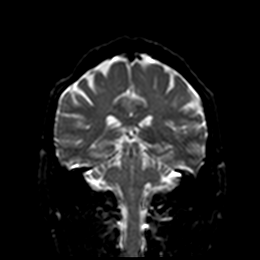
[im 40/66]
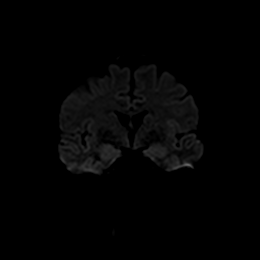
[im 53/66]
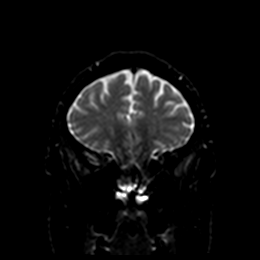
[im 66/66]
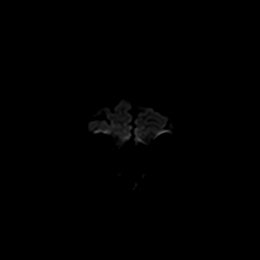

[Series 8: DWI · coronal · 4.0mm · 0.88mm/px · 3 of 33 slices shown (4 of 4)]
[im 1/33]
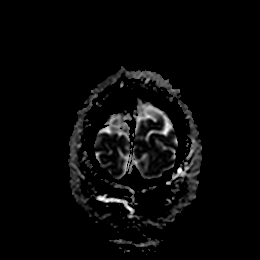
[im 17/33]
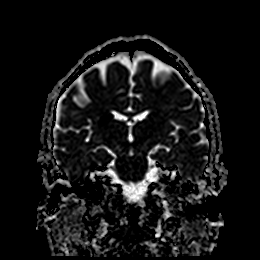
[im 33/33]
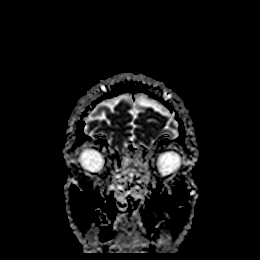

[Series 9: T1 · sagittal · 5.0mm · 0.78mm/px · 2 of 23 slices shown]
[im 1/23]
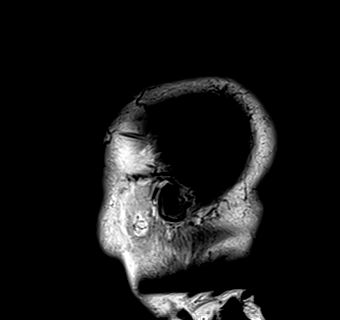
[im 23/23]
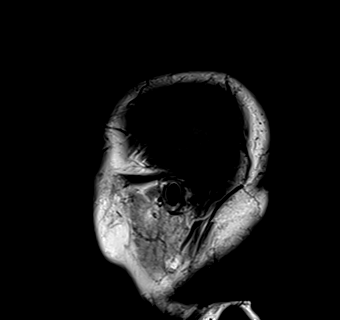

[Series 10: T2 · axial · 5.0mm · 0.72mm/px · z∈[-37,+107]mm · 2 of 25 slices shown (1 of 2)]
[im 1/25]
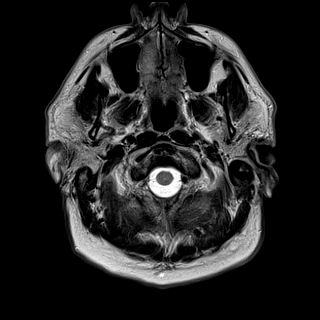
[im 25/25]
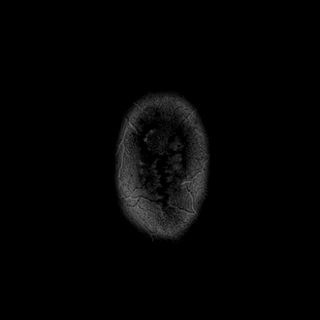

[Series 11: FLAIR · axial · 5.0mm · 0.45mm/px · z∈[-37,+106]mm · 2 of 25 slices shown]
[im 1/25]
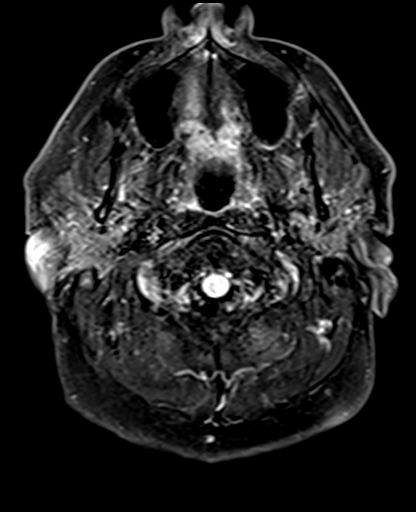
[im 25/25]
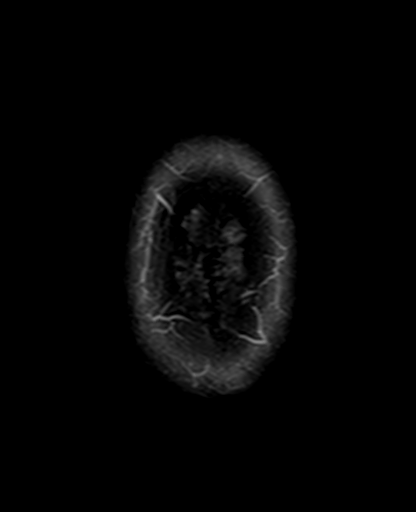

[Series 13: pha_images · axial · 3.0mm · 0.90mm/px · z∈[-54,+123]mm · 5 of 60 slices shown]
[im 1/60]
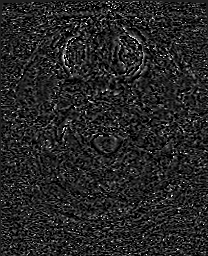
[im 15/60]
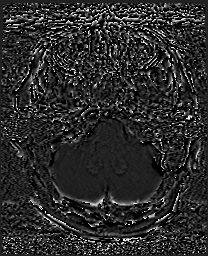
[im 30/60]
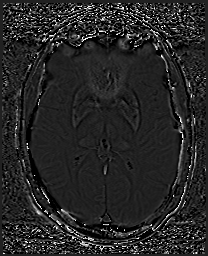
[im 45/60]
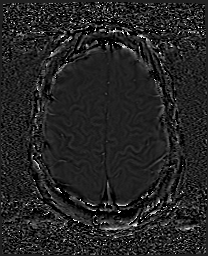
[im 60/60]
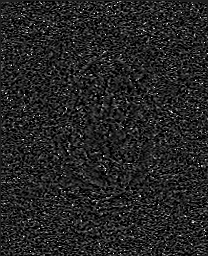

[Series 14: swi_images · axial · 3.0mm · 0.90mm/px · z∈[-54,+123]mm · 5 of 60 slices shown]
[im 1/60]
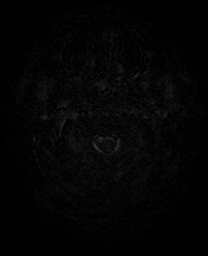
[im 15/60]
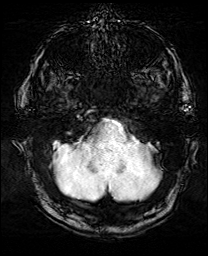
[im 30/60]
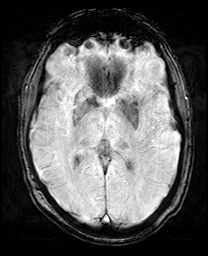
[im 45/60]
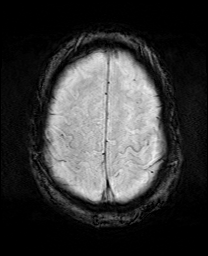
[im 60/60]
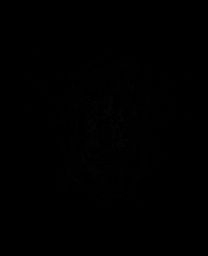

[Series 17: T2 · coronal · 5.0mm · 0.34mm/px · 3 of 29 slices shown (2 of 2)]
[im 1/29]
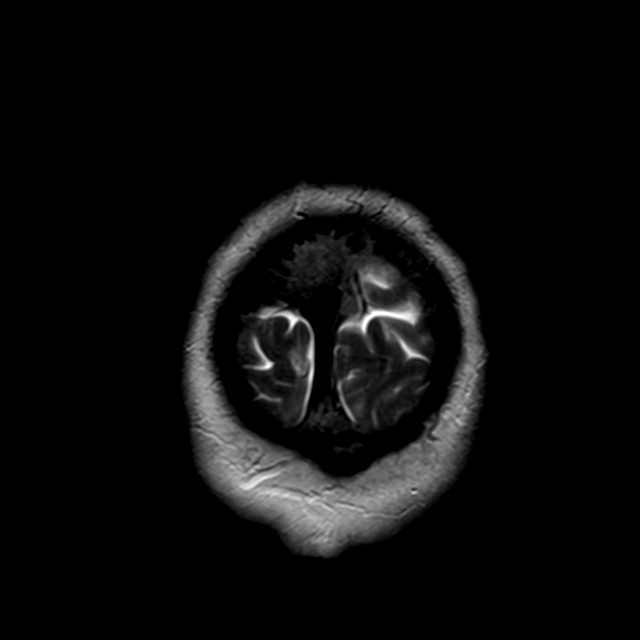
[im 15/29]
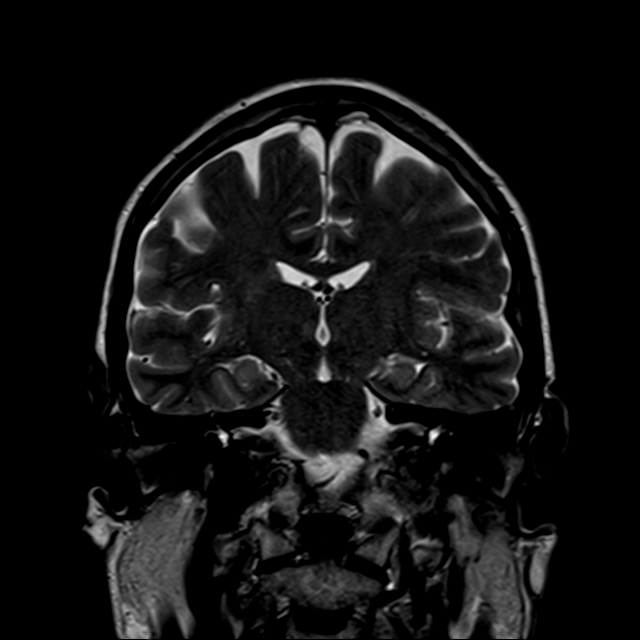
[im 29/29]
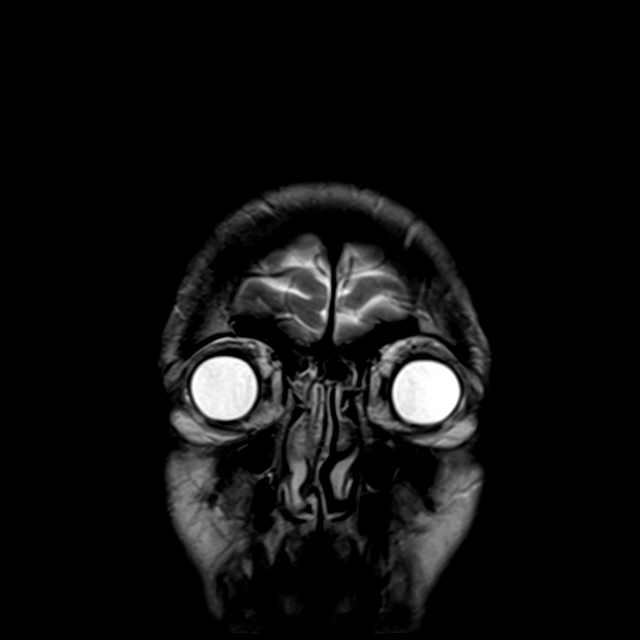

[43 of 48 positions shown; findings below may reference images not displayed]

FINDINGS: MR HEAD FINDINGS

Brain: Band of restricted diffusion at the posterior right putamen,
corona radiata, and posterior caudate nucleus. No pre-existing
ischemic injury. No hemorrhage, hydrocephalus, or collection

Vascular: Arterial findings below

Skull and upper cervical spine: Normal marrow signal. Facet spurring
asymmetric to the left at C4-5.

Sinuses/Orbits: Negative

MRA HEAD FINDINGS

Anterior circulation: Aplastic left A1 segment with fetal type left
PCA. No branch occlusion, beading, or aneurysm. Negative for
significant stenosis.

Posterior circulation: Left dominant. The vertebral and basilar
arteries are smoothly contoured and widely patent. No PCA branch
occlusion. Minor right P3/4 segment stenosis.

MRA NECK FINDINGS

Antegrade flow in the carotid and vertebral arteries by
time-of-flight.

Unremarkable neck mask.

Normal appearance of the arch and great vessels. Proximal
subclavian, vertebral, and carotid arteries are smoothly contoured
and widely patent on both sides.
IMPRESSION: Brain MRI:

Acute perforator infarct at the right basal ganglia and corona
radiata.

Intracranial MRA:

No emergent finding. No branch occlusion or stenosis to correlate
with the infarct.

Neck MRA:

Negative.

## 2021-07-02 IMAGING — MR MR MRA NECK WO/W CM
4 of 7 series · 28 of 48 positions shown · IV contrast (gadavist)
Comparison: Head CT from yesterday

CLINICAL DATA: Stroke follow-up

EXAM:
MRI HEAD WITHOUT CONTRAST
MRA HEAD WITHOUT CONTRAST
MRA OF THE NECK WITHOUT AND WITH CONTRAST
TECHNIQUE: Multiplanar, multi-echo pulse sequences of the brain and surrounding
structures were acquired without intravenous contrast. Angiographic
images of the Circle of Willis were acquired using MRA technique
without intravenous contrast. Angiographic images of the neck were
acquired using MRA technique without and with intravenous contrast.
Carotid stenosis measurements (when applicable) are obtained
utilizing NASCET criteria, using the distal internal carotid
diameter as the denominator.
CONTRAST:  9mL GADAVIST GADOBUTROL 1 MMOL/ML IV SOLN

[Series 7: tof_fl3d_tra_iso · axial · B · 0.6mm · 0.52mm/px · z∈[-157,+11]mm · 11 of 310 slices shown]
[im 15/310]
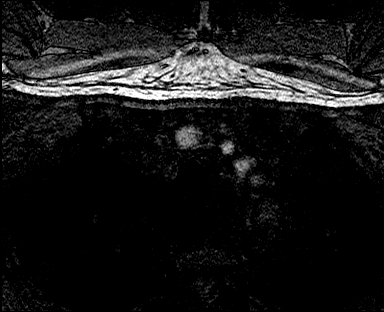
[im 45/310]
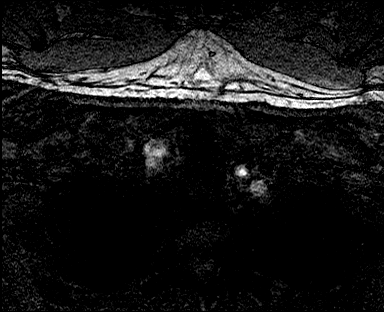
[im 59/310]
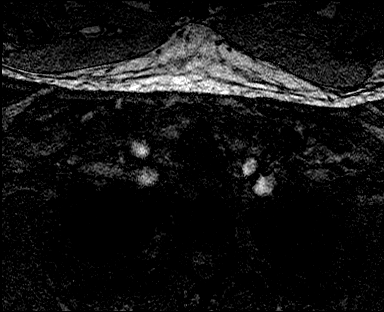
[im 89/310]
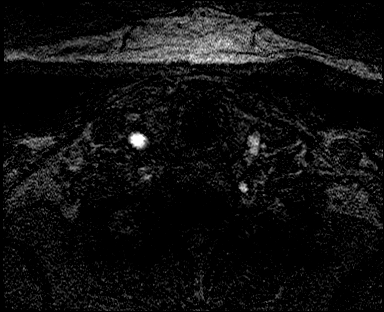
[im 133/310]
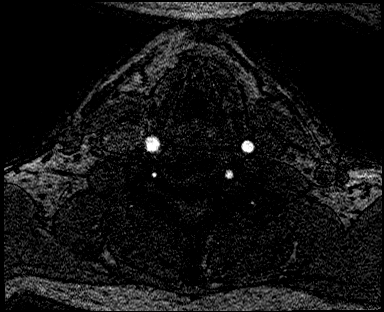
[im 162/310]
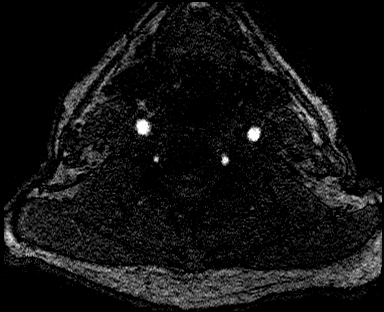
[im 177/310]
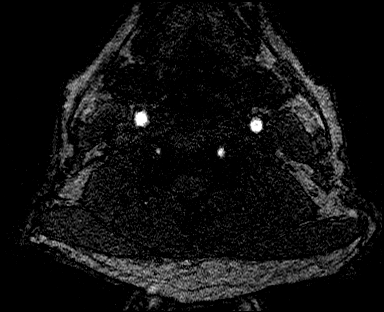
[im 221/310]
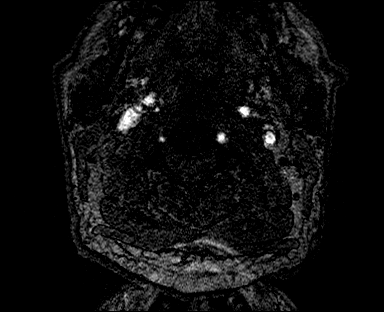
[im 251/310]
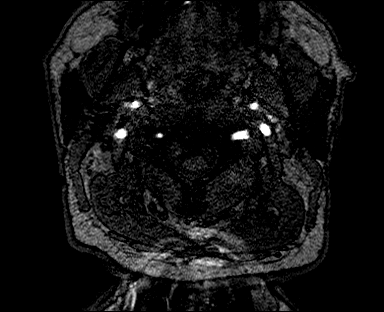
[im 265/310]
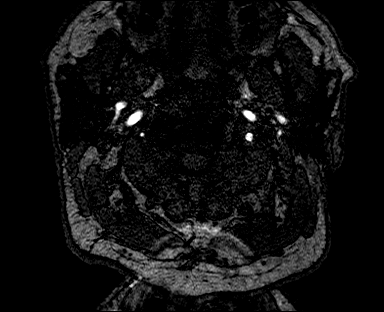
[im 295/310]
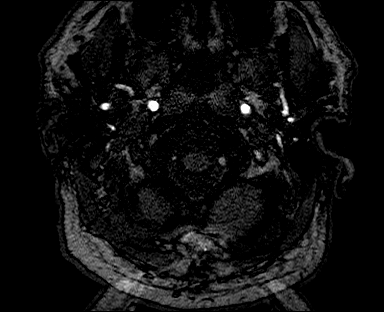

[Series 10: angio_fl3d_cor_highres_pre_ttc=3.0s · coronal · B · 0.9mm · 0.62mm/px · 6 of 80 slices shown]
[im 1/80]
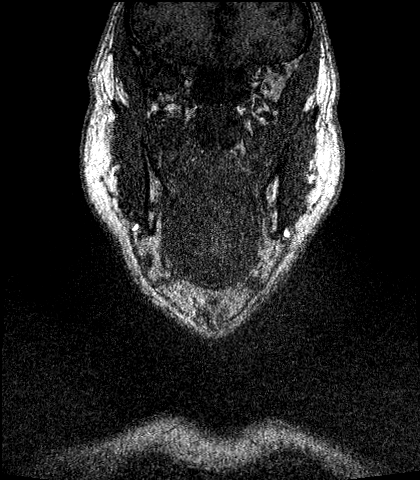
[im 16/80]
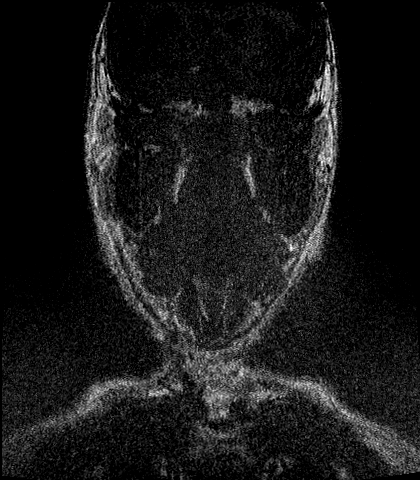
[im 32/80]
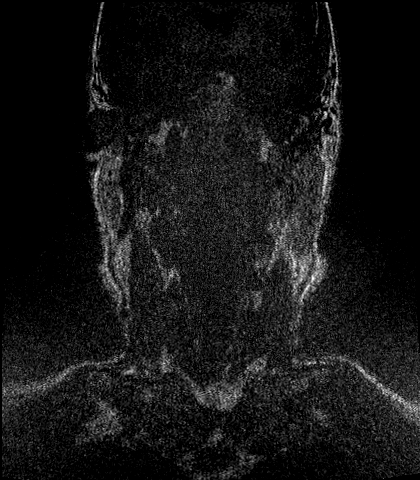
[im 48/80]
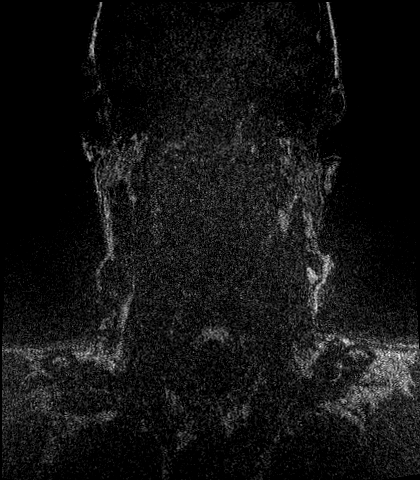
[im 64/80]
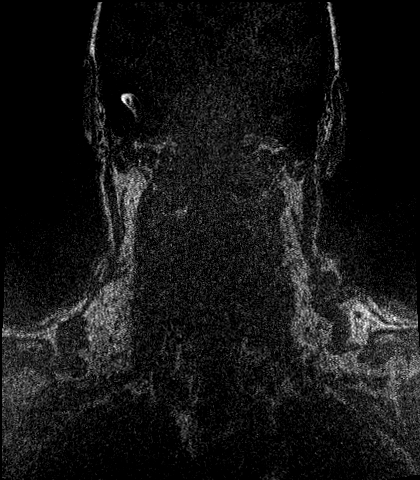
[im 80/80]
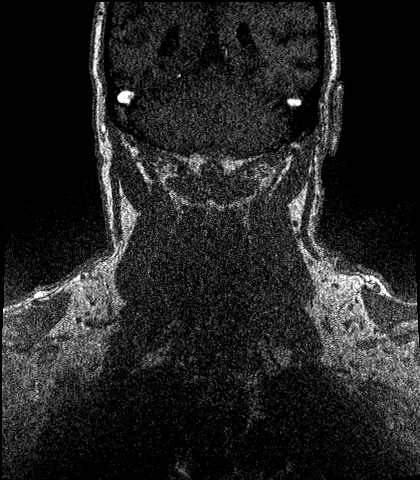

[Series 12: angio_fl3d_cor_highres_post_ttc=3.0s · coronal · B · 0.9mm · 0.62mm/px · 6 of 80 slices shown]
[im 1/80]
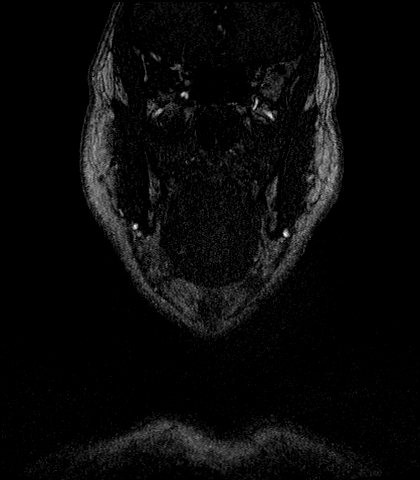
[im 16/80]
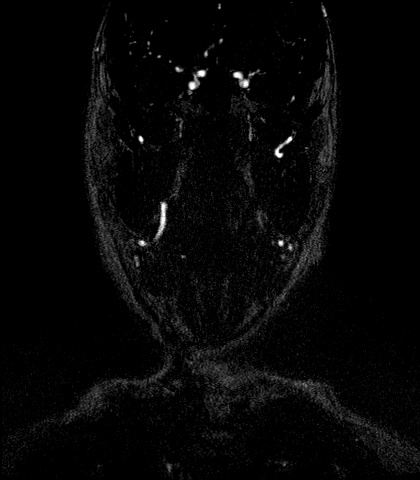
[im 32/80]
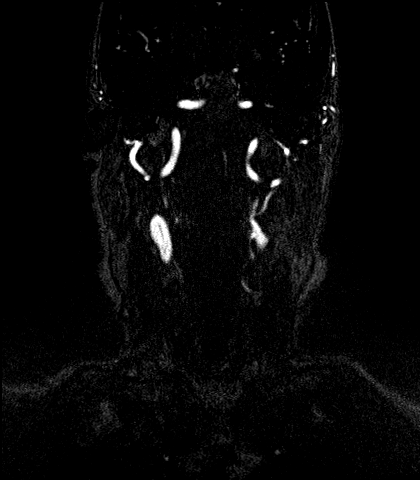
[im 48/80]
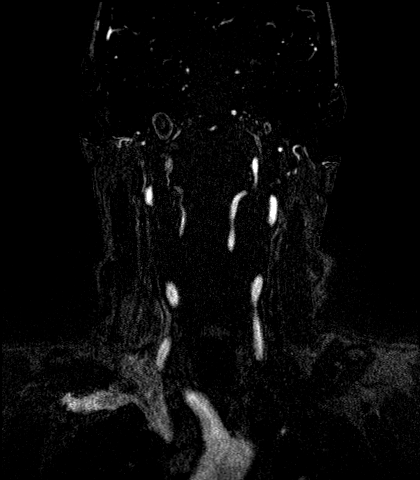
[im 64/80]
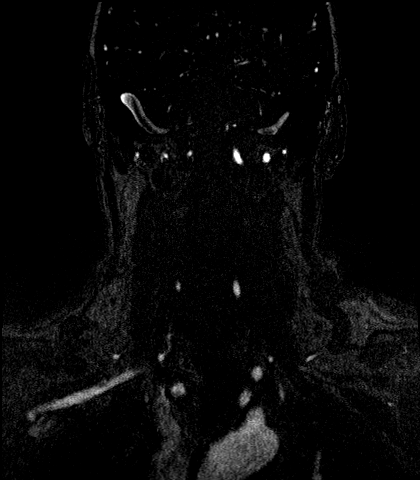
[im 80/80]
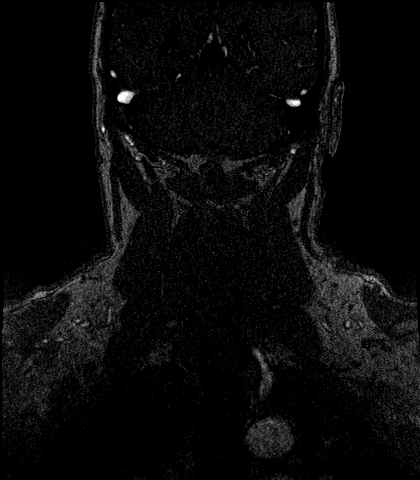

[Series 13: angio_fl3d_cor_highres_post_ttc=3.0s_moco-adv · coronal · B · 0.9mm · 0.62mm/px · 5 of 80 slices shown]
[im 1/80]
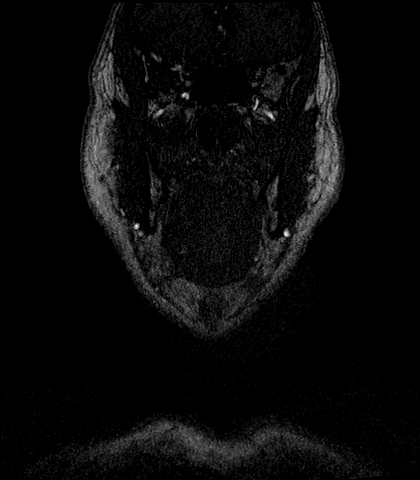
[im 16/80]
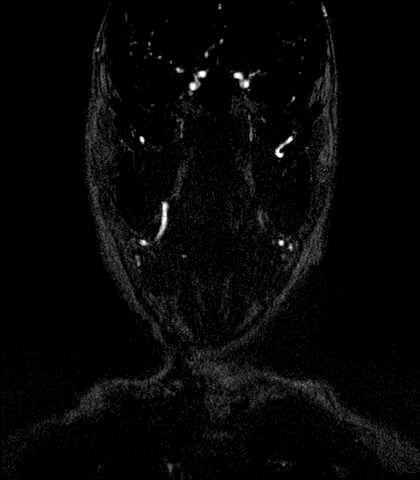
[im 32/80]
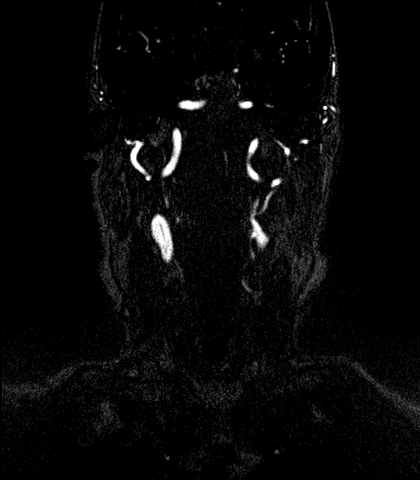
[im 48/80]
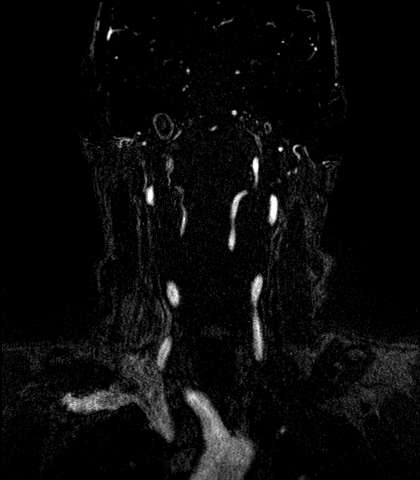
[im 80/80]
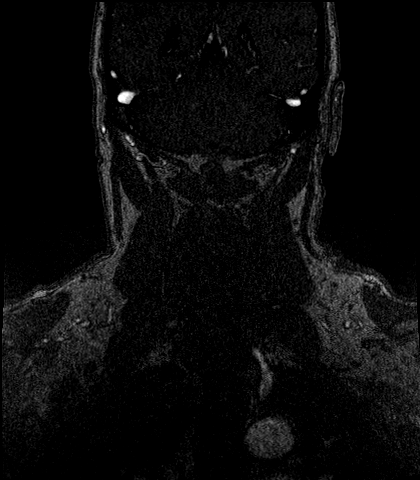

[28 of 48 positions shown; findings below may reference images not displayed]

FINDINGS: MR HEAD FINDINGS

Brain: Band of restricted diffusion at the posterior right putamen,
corona radiata, and posterior caudate nucleus. No pre-existing
ischemic injury. No hemorrhage, hydrocephalus, or collection

Vascular: Arterial findings below

Skull and upper cervical spine: Normal marrow signal. Facet spurring
asymmetric to the left at C4-5.

Sinuses/Orbits: Negative

MRA HEAD FINDINGS

Anterior circulation: Aplastic left A1 segment with fetal type left
PCA. No branch occlusion, beading, or aneurysm. Negative for
significant stenosis.

Posterior circulation: Left dominant. The vertebral and basilar
arteries are smoothly contoured and widely patent. No PCA branch
occlusion. Minor right P3/4 segment stenosis.

MRA NECK FINDINGS

Antegrade flow in the carotid and vertebral arteries by
time-of-flight.

Unremarkable neck mask.

Normal appearance of the arch and great vessels. Proximal
subclavian, vertebral, and carotid arteries are smoothly contoured
and widely patent on both sides.
IMPRESSION: Brain MRI:

Acute perforator infarct at the right basal ganglia and corona
radiata.

Intracranial MRA:

No emergent finding. No branch occlusion or stenosis to correlate
with the infarct.

Neck MRA:

Negative.

## 2021-07-02 IMAGING — MR MR MRA HEAD W/O CM
1 series · 19 of 48 positions shown · IV contrast (gadavist)
Comparison: Head CT from yesterday

CLINICAL DATA: Stroke follow-up

EXAM:
MRI HEAD WITHOUT CONTRAST
MRA HEAD WITHOUT CONTRAST
MRA OF THE NECK WITHOUT AND WITH CONTRAST
TECHNIQUE: Multiplanar, multi-echo pulse sequences of the brain and surrounding
structures were acquired without intravenous contrast. Angiographic
images of the Circle of Willis were acquired using MRA technique
without intravenous contrast. Angiographic images of the neck were
acquired using MRA technique without and with intravenous contrast.
Carotid stenosis measurements (when applicable) are obtained
utilizing NASCET criteria, using the distal internal carotid
diameter as the denominator.
CONTRAST:  9mL GADAVIST GADOBUTROL 1 MMOL/ML IV SOLN

[Series 5: 3d cow · axial · 0.5mm · 0.41mm/px · z∈[-91,-10]mm · 19 of 172 slices shown]
[im 1/172]
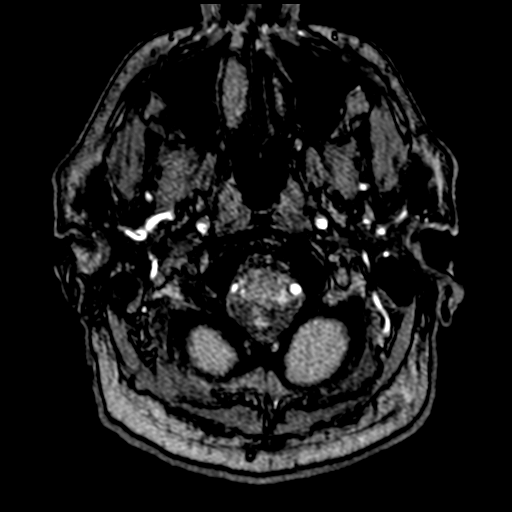
[im 4/172]
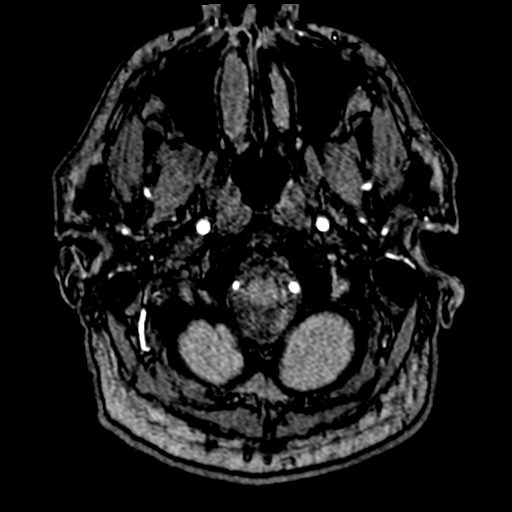
[im 8/172]
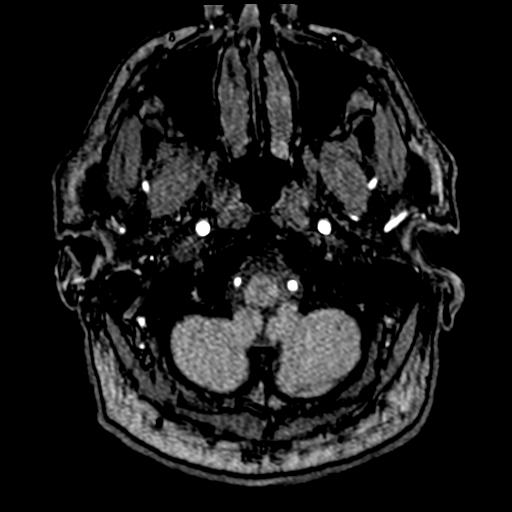
[im 11/172]
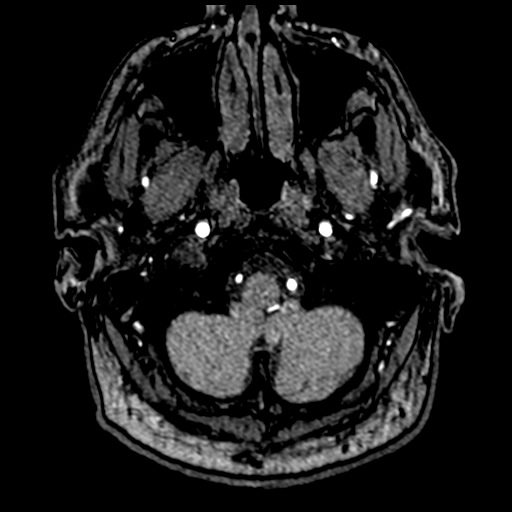
[im 15/172]
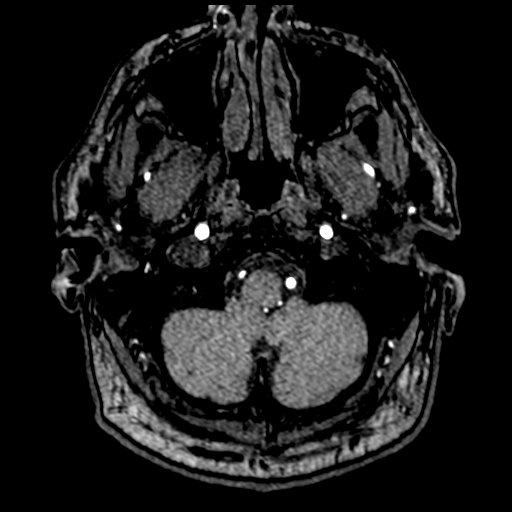
[im 19/172]
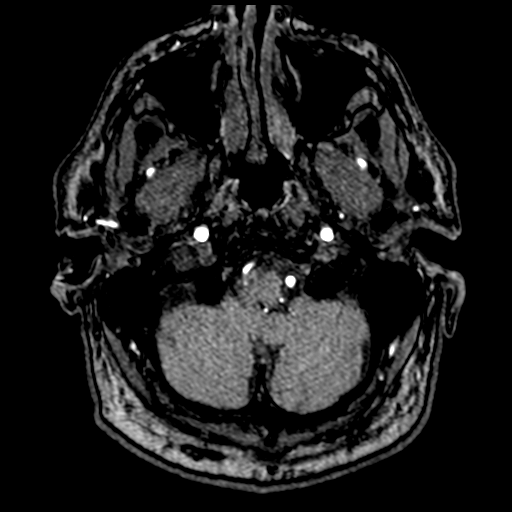
[im 22/172]
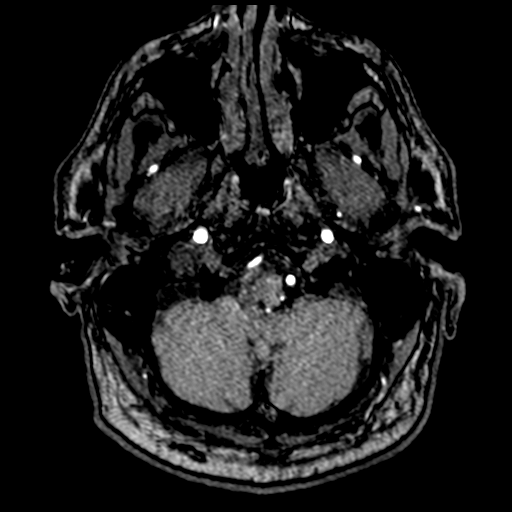
[im 26/172]
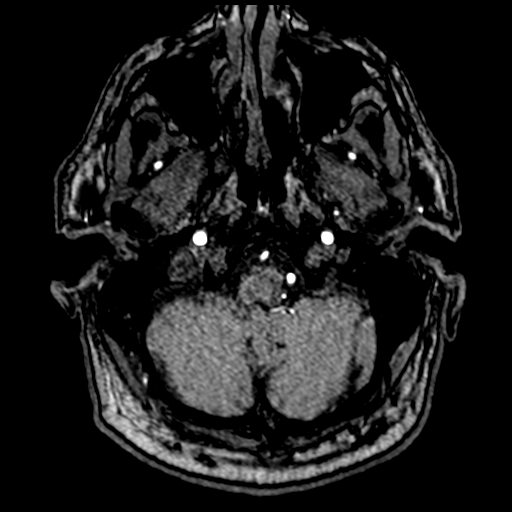
[im 30/172]
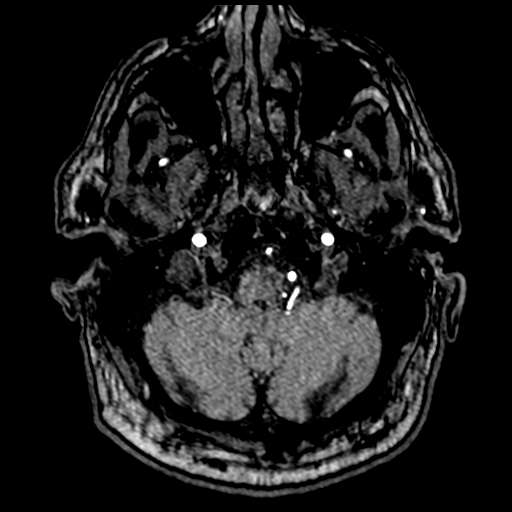
[im 33/172]
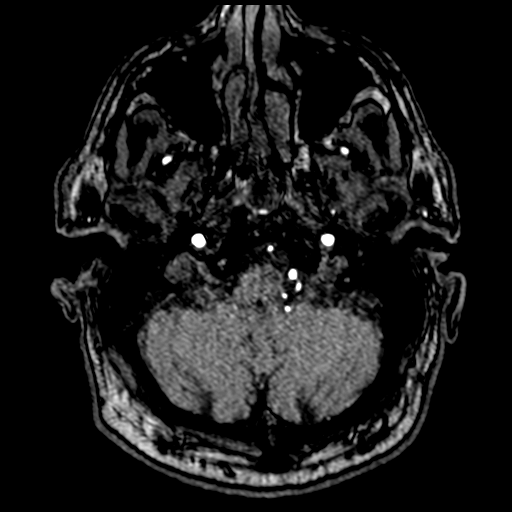
[im 37/172]
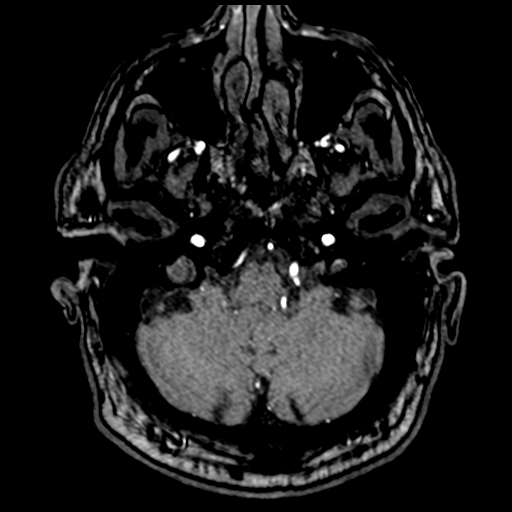
[im 55/172]
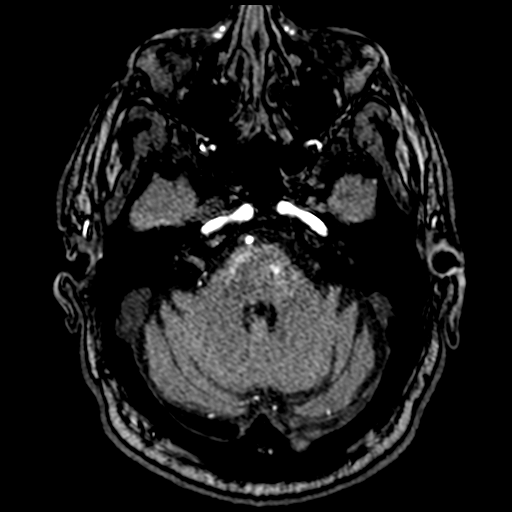
[im 77/172]
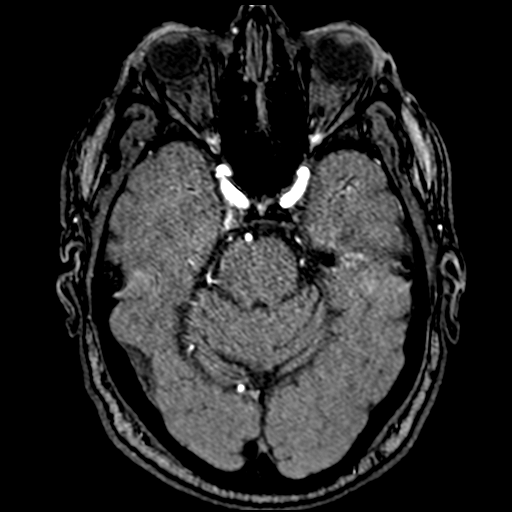
[im 88/172]
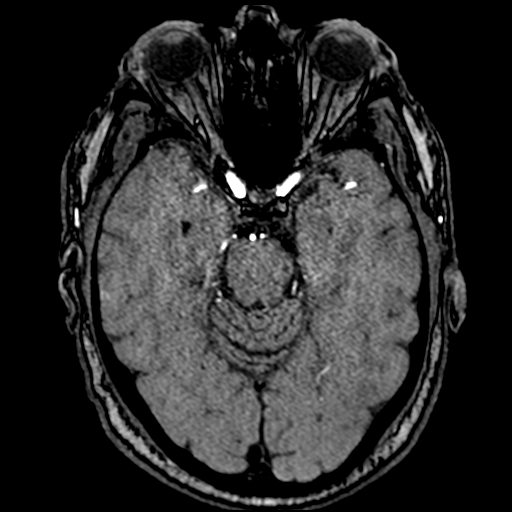
[im 99/172]
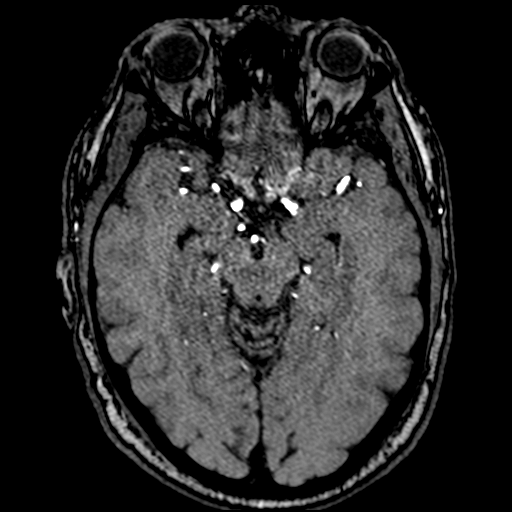
[im 121/172]
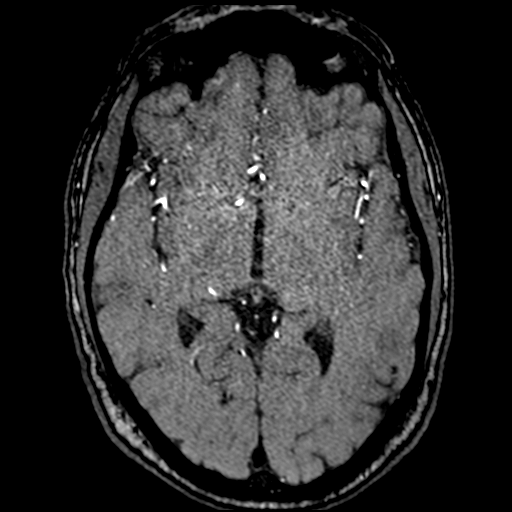
[im 142/172]
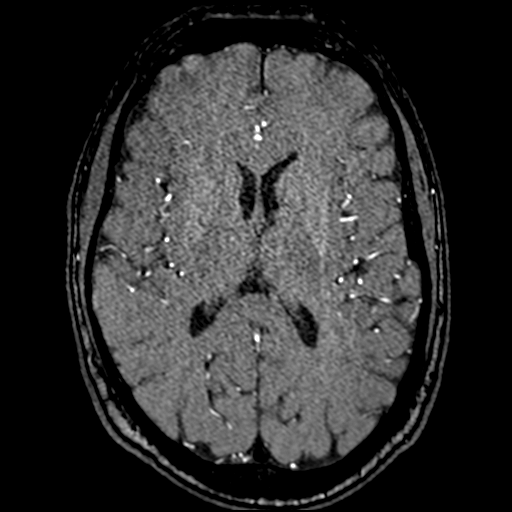
[im 146/172]
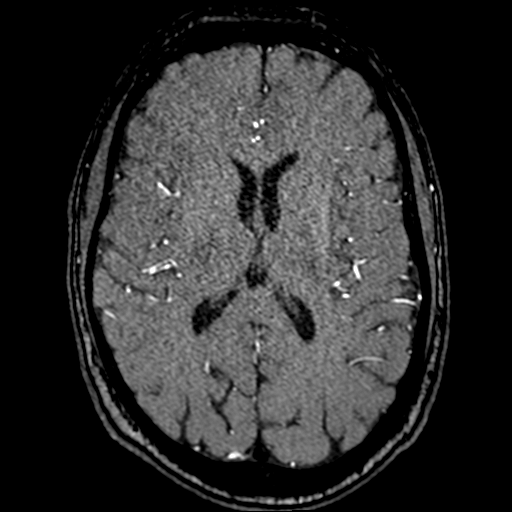
[im 164/172]
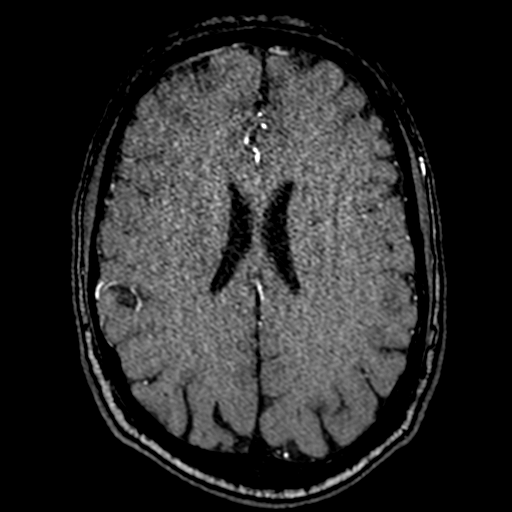

[19 of 48 positions shown; findings below may reference images not displayed]

FINDINGS: MR HEAD FINDINGS

Brain: Band of restricted diffusion at the posterior right putamen,
corona radiata, and posterior caudate nucleus. No pre-existing
ischemic injury. No hemorrhage, hydrocephalus, or collection

Vascular: Arterial findings below

Skull and upper cervical spine: Normal marrow signal. Facet spurring
asymmetric to the left at C4-5.

Sinuses/Orbits: Negative

MRA HEAD FINDINGS

Anterior circulation: Aplastic left A1 segment with fetal type left
PCA. No branch occlusion, beading, or aneurysm. Negative for
significant stenosis.

Posterior circulation: Left dominant. The vertebral and basilar
arteries are smoothly contoured and widely patent. No PCA branch
occlusion. Minor right P3/4 segment stenosis.

MRA NECK FINDINGS

Antegrade flow in the carotid and vertebral arteries by
time-of-flight.

Unremarkable neck mask.

Normal appearance of the arch and great vessels. Proximal
subclavian, vertebral, and carotid arteries are smoothly contoured
and widely patent on both sides.
IMPRESSION: Brain MRI:

Acute perforator infarct at the right basal ganglia and corona
radiata.

Intracranial MRA:

No emergent finding. No branch occlusion or stenosis to correlate
with the infarct.

Neck MRA:

Negative.

## 2021-07-02 IMAGING — CT CT HEAD W/O CM
3 series · 15 of 47 positions shown, 18 images · non-contrast
Comparison: CT dated [DATE]. MRI dated [DATE]

CLINICAL DATA: Stroke follow-up.



[Series 4: head 5.0 h30s · axial · 0.51mm/px · z∈[+1261,+1406]mm · 9 of 35 slices shown, 12 images]
[im 3/35  brain]
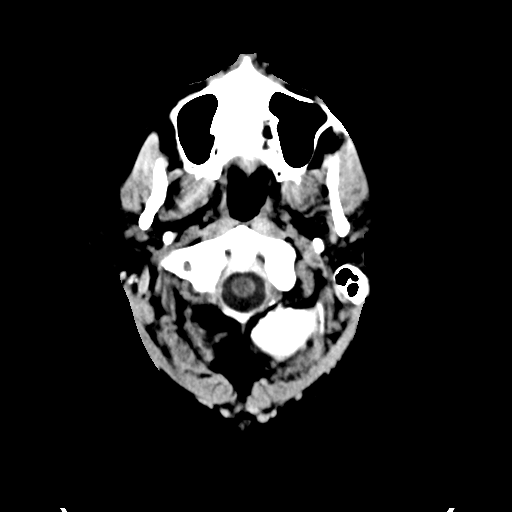
[im 3/35  bone]
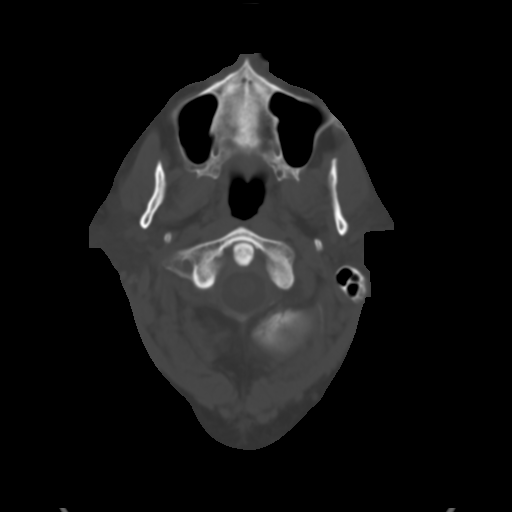
[im 6/35  brain]
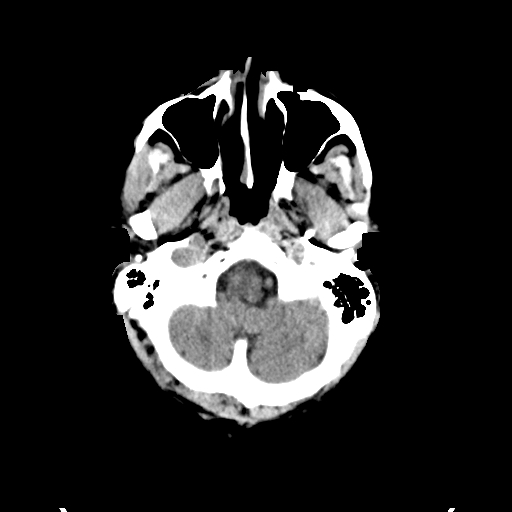
[im 10/35  brain]
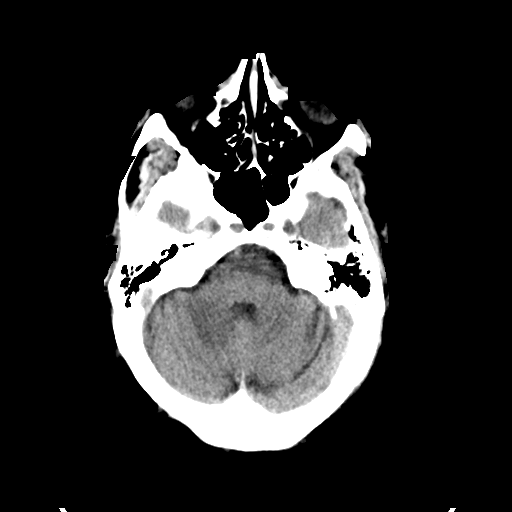
[im 13/35  brain]
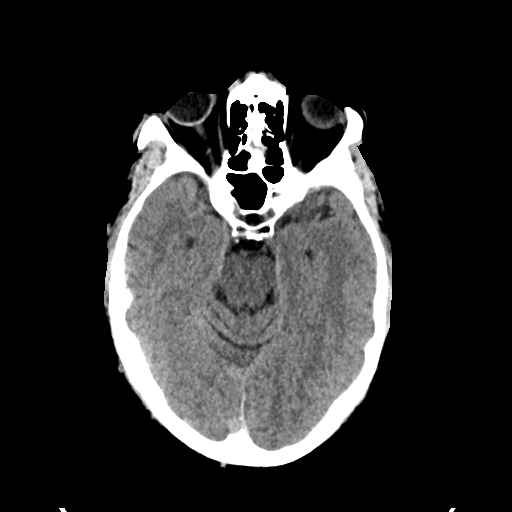
[im 18/35  brain]
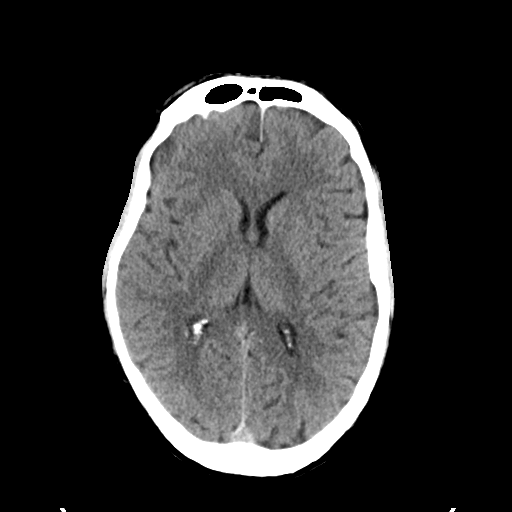
[im 18/35  bone]
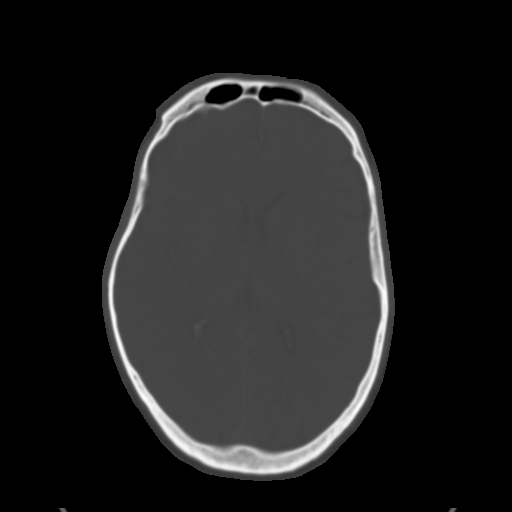
[im 22/35  brain]
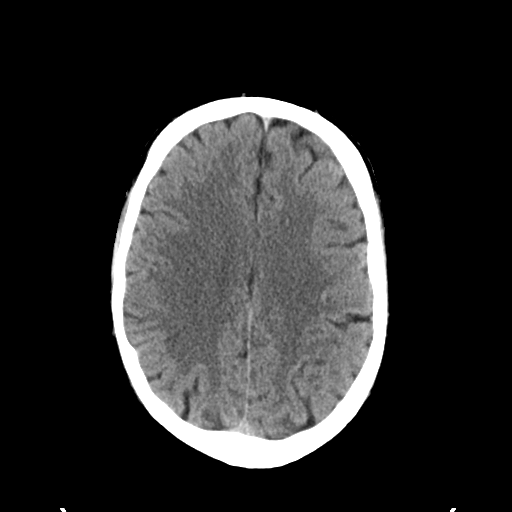
[im 25/35  brain]
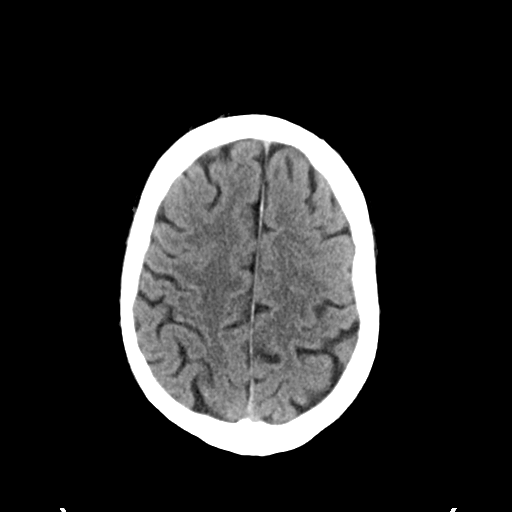
[im 29/35  brain]
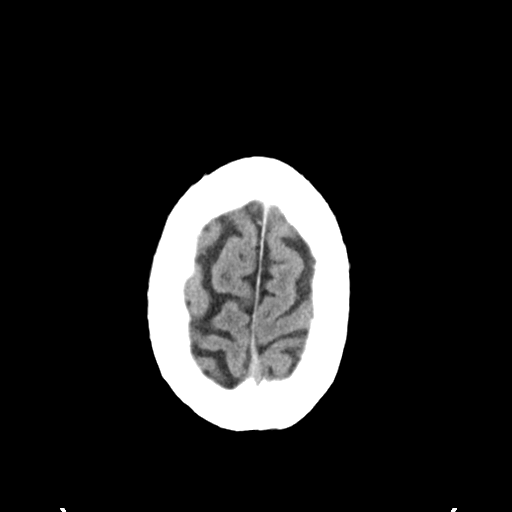
[im 32/35  brain]
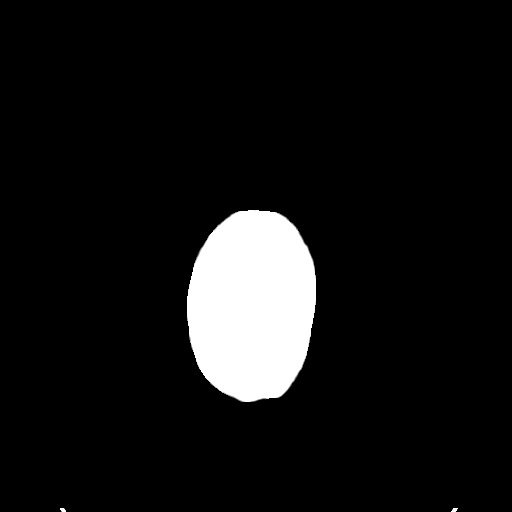
[im 32/35  bone]
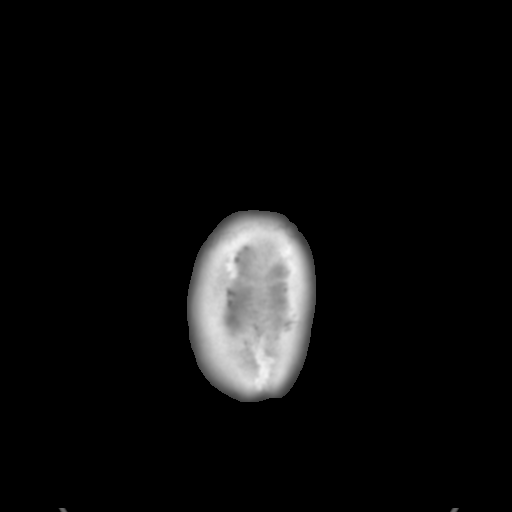

[Series 5: head 3.0 mpr cor · coronal · 0.37mm/px · 3 of 77 slices shown]
[im 26/77  brain]
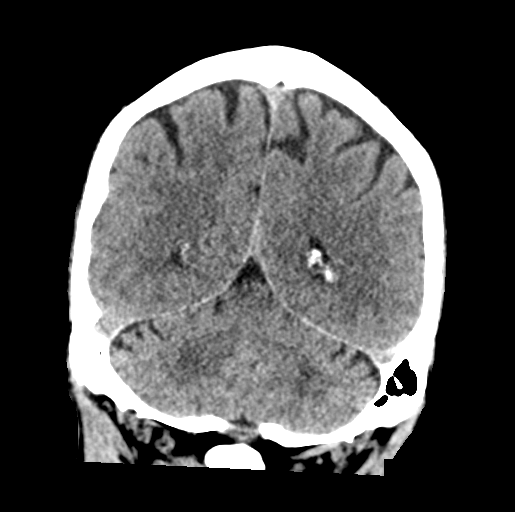
[im 34/77  brain]
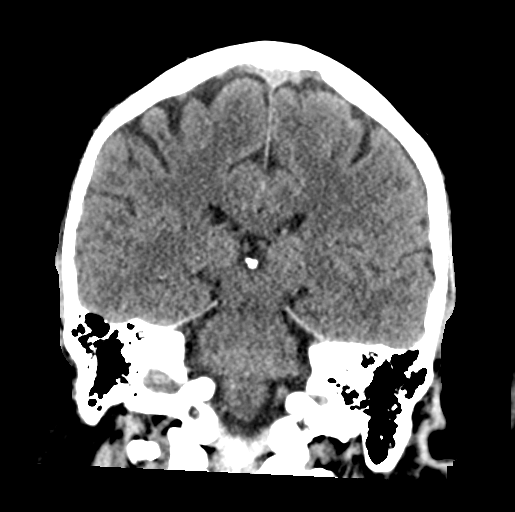
[im 43/77  brain]
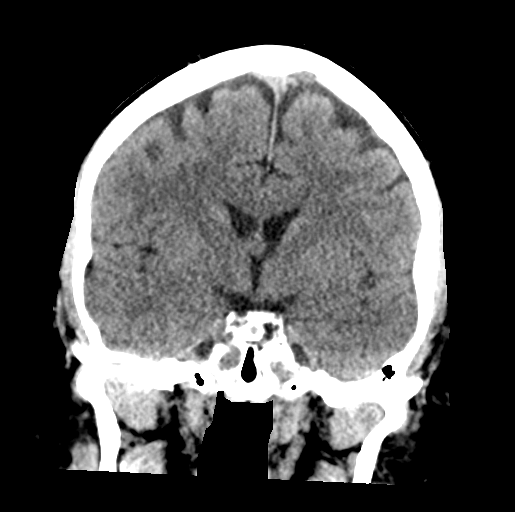

[Series 6: head 3.0 mpr sag · sagittal · 0.35mm/px · 3 of 58 slices shown]
[im 20/58  brain]
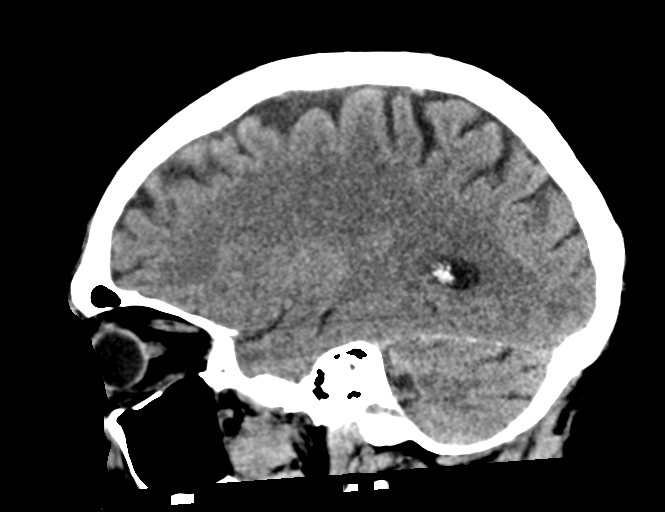
[im 29/58  brain]
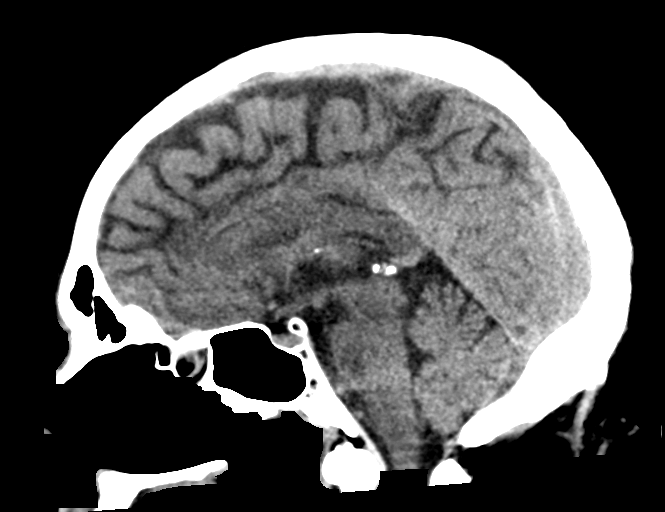
[im 39/58  brain]
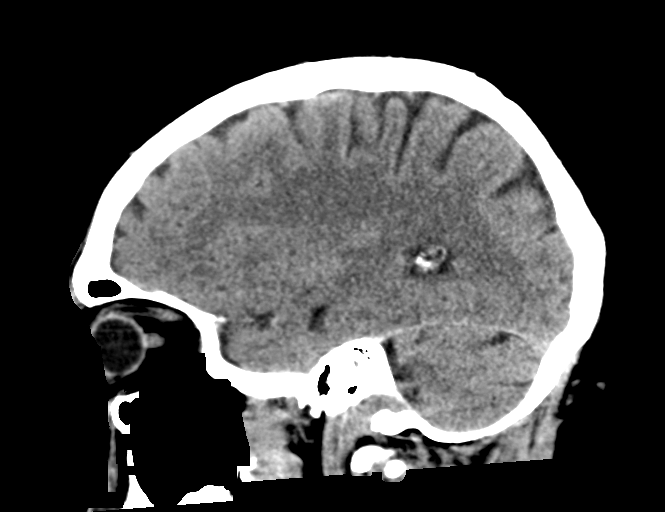

[15 of 47 positions shown; findings below may reference images not displayed]

FINDINGS: Brain: Faint area of hypodensity involving the posterior right basal
ganglia ([DATE]) may correspond to the infarcts seen on the MRI. The
ventricles and sulci are appropriate size for patient's age. The
gray-white matter discrimination is otherwise preserved. There is no
acute intracranial hemorrhage. No mass effect or midline shift. No
extra-axial fluid collection.

Vascular: No hyperdense vessel or unexpected calcification.

Skull: Normal. Negative for fracture or focal lesion.

Sinuses/Orbits: No acute finding.

Other: None
IMPRESSION: 1. No acute intracranial hemorrhage.
2. Faint area of hypodensity involving the posterior right basal
ganglia may correspond to the infarcts seen on the MRI.

## 2021-07-02 MED ORDER — TAMSULOSIN HCL 0.4 MG PO CAPS
0.4000 mg | ORAL_CAPSULE | Freq: Every day | ORAL | Status: DC
  Administered 2021-07-02 – 2021-07-03 (×2): 0.4 mg via ORAL
  Filled 2021-07-02 (×2): qty 1

## 2021-07-02 MED ORDER — ASPIRIN EC 81 MG PO TBEC
81.0000 mg | DELAYED_RELEASE_TABLET | Freq: Every day | ORAL | Status: DC
  Administered 2021-07-02 – 2021-07-04 (×3): 81 mg via ORAL
  Filled 2021-07-02 (×3): qty 1

## 2021-07-02 MED ORDER — IRBESARTAN 300 MG PO TABS
300.0000 mg | ORAL_TABLET | Freq: Every day | ORAL | Status: DC
  Administered 2021-07-02 – 2021-07-04 (×3): 300 mg via ORAL
  Filled 2021-07-02: qty 2
  Filled 2021-07-02: qty 1
  Filled 2021-07-02: qty 2

## 2021-07-02 MED ORDER — ATORVASTATIN CALCIUM 40 MG PO TABS: 40.0000 mg | ORAL_TABLET | Freq: Every day | ORAL | Status: DC

## 2021-07-02 MED ORDER — GADOBUTROL 1 MMOL/ML IV SOLN
9.0000 mL | Freq: Once | INTRAVENOUS | Status: AC | PRN
  Administered 2021-07-02: 9 mL via INTRAVENOUS

## 2021-07-02 MED ORDER — ROSUVASTATIN CALCIUM 20 MG PO TABS
20.0000 mg | ORAL_TABLET | Freq: Every day | ORAL | Status: DC
  Administered 2021-07-02 – 2021-07-04 (×3): 20 mg via ORAL
  Filled 2021-07-02 (×3): qty 1

## 2021-07-02 NOTE — Evaluation (Signed)
Physical Therapy Evaluation Patient Details Name: Eddie Calhoun MRN: [id number] DOB: 04/01/1951 Today's Date: 07/02/2021  History of Present Illness  71 yo male presenting to ED on 2/25 with self sided weakness. TNK given 2/25. 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 PMH including BPH, HTN, and thrombocytopenia.  Clinical Impression  Pt presents to PT with deficits in L strength, sensation, balance, gait, functional mobility, endurance. Pt with L foot drop and knee hyperextension during ambulation. Pt with intermittent left lean during standing, requiring minA to correct. Pt will benefit from aggressive mobilization in an effort to reduce falls risk and restore independence. PT recommends AIR admission as the pt demonstrates the ability to make significant progress with high intensity inpatient PT services.     Recommendations for follow up therapy are one component of a multi-disciplinary discharge planning process, led by the attending physician.  Recommendations may be updated based on patient status, additional functional criteria and insurance authorization.  Follow Up Recommendations Acute inpatient rehab (3hours/day)    Assistance Recommended at Discharge Intermittent Supervision/Assistance  Patient can return home with the following  A little help with walking and/or transfers;A lot of help with bathing/dressing/bathroom;Assistance with cooking/housework;Assist for transportation;Help with stairs or ramp for entrance    Equipment Recommendations  (TBD)  Recommendations for Other Services  Rehab consult    Functional Status Assessment Patient has had a recent decline in their functional status and demonstrates the ability to make significant improvements in function in a reasonable and predictable amount of time.     Precautions / Restrictions Precautions Precautions: Fall Restrictions Weight Bearing Restrictions: No       Mobility  Bed Mobility Overal bed mobility: Needs Assistance Bed Mobility: Supine to Sit, Sit to Supine     Supine to sit: Min guard, HOB elevated Sit to supine: Min guard, HOB elevated   General bed mobility comments: increased time, use of rails    Transfers Overall transfer level: Needs assistance Equipment used: Rolling walker (2 wheels) Transfers: Sit to/from Stand Sit to Stand: Min guard           General transfer comment: verbal cues for hand placement    Ambulation/Gait Ambulation/Gait assistance: Min assist Gait Distance (Feet): 30 Feet Assistive device: Rolling walker (2 wheels) Gait Pattern/deviations: Step-to pattern, Knee hyperextension - left, Decreased dorsiflexion - left Gait velocity: reduced Gait velocity interpretation: <1.8 ft/sec, indicate of risk for recurrent falls   General Gait Details: pt with slowed step-to gait, L foot drag which pt compensates for with hip and knee flexion. Pt often with narrowed BOS. L knee hyperextension during stance phase  Stairs            Wheelchair Mobility    Modified Rankin (Stroke Patients Only) Modified Rankin (Stroke Patients Only) Pre-Morbid Rankin Score: No symptoms Modified Rankin: Moderately severe disability     Balance Overall balance assessment: Needs assistance Sitting-balance support: No upper extremity supported, Feet supported Sitting balance-Leahy Scale: Good     Standing balance support: Bilateral upper extremity supported, Reliant on assistive device for balance Standing balance-Leahy Scale: Poor Standing balance comment: left lean initially, minG for safety                             Pertinent Vitals/Pain Pain Assessment Pain Assessment: No/denies pain    Home Living Family/patient expects to be discharged to:: Private residence Living Arrangements: Spouse/significant other Available Help at  Discharge: Family;Available 24 hours/day Type of Home: House Home  Access: Stairs to enter   [employer] of Steps: 3   Home Layout: Multi-level;Able to live on main level with bedroom/bathroom Home Equipment: None      Prior Function Prior Level of Function : Independent/Modified Independent;Driving               ADLs Comments: Retired Water engineer. Loves golf and bowling. Very active.     Hand Dominance   Dominant Hand: Left    Extremity/Trunk Assessment   Upper Extremity Assessment Upper Extremity Assessment: Defer to OT evaluation LUE Deficits / Details: Able to perform digit ROM (decreased opposition to fifth digit), wrist ROM, supination/prontation, and elbow ROM with increased time and effort. Limited shoulder ROM to 30* forward flexion and 50* abduction LUE Coordination: decreased gross motor;decreased fine motor    Lower Extremity Assessment Lower Extremity Assessment: LLE deficits/detail LLE Deficits / Details: PF/DF 2-/5, knee extension 4-/5, hip flexion 3/5 LLE Sensation: decreased light touch    Cervical / Trunk Assessment Cervical / Trunk Assessment: Other exceptions Cervical / Trunk Exceptions: L side weakness  Communication   Communication: No difficulties  Cognition Arousal/Alertness: Awake/alert Behavior During Therapy: WFL for tasks assessed/performed Overall Cognitive Status: Within Functional Limits for tasks assessed                                          General Comments General comments (skin integrity, edema, etc.): VSS on RA    Exercises     Assessment/Plan    PT Assessment Patient needs continued PT services  PT Problem List Decreased strength;Decreased activity tolerance;Decreased mobility;Decreased balance;Decreased knowledge of use of DME;Impaired sensation       PT Treatment Interventions Gait training;Stair training;DME instruction;Functional mobility training;Therapeutic activities;Therapeutic exercise;Neuromuscular re-education;Balance  training;Patient/family education    PT Goals (Current goals can be found in the Care Plan section)  Acute Rehab PT Goals Patient Stated Goal: to return to independent mobility PT Goal Formulation: With patient Time For Goal Achievement: 07/16/21 Potential to Achieve Goals: Good    Frequency Min 4X/week     Co-evaluation               AM-PAC PT "6 Clicks" Mobility  Outcome Measure Help needed turning from your back to your side while in a flat bed without using bedrails?: A Little Help needed moving from lying on your back to sitting on the side of a flat bed without using bedrails?: A Little Help needed moving to and from a bed to a chair (including a wheelchair)?: A Little Help needed standing up from a chair using your arms (e.g., wheelchair or bedside chair)?: A Little Help needed to walk in hospital room?: A Little Help needed climbing 3-5 steps with a railing? : Total 6 Click Score: 16    End of Session   Activity Tolerance: Patient tolerated treatment well Patient left: in bed;with call bell/phone within reach;with bed alarm set Nurse Communication: Mobility status PT Visit Diagnosis: Other abnormalities of gait and mobility (R26.89);Muscle weakness (generalized) (M62.81);Other symptoms and signs involving the nervous system (R29.898);Hemiplegia and hemiparesis Hemiplegia - Right/Left: Left Hemiplegia - dominant/non-dominant: Dominant Hemiplegia - caused by: Cerebral infarction    Time: 2878-6767 PT Time Calculation (min) (ACUTE ONLY): 28 min   Charges:   PT Evaluation $PT Eval Low Complexity: 1 Low  Zenaida Niece, PT, DPT Acute Rehabilitation Pager: [phone] Office [place] 07/02/2021, 2:46 PM

## 2021-07-02 NOTE — Progress Notes (Signed)
PT Cancellation Note  Patient Details Name: Eddie Calhoun MRN: [id number] DOB: November 29, 1950   Cancelled Treatment:    Reason Eval/Treat Not Completed: Active bedrest order. Pt on bedrest until 10PM per orders. PT will plan on following up Monday unless bedrest orders are removed earlier.   Zenaida Niece 07/02/2021, 7:41 AM

## 2021-07-02 NOTE — Evaluation (Signed)
Occupational Therapy Evaluation Patient Details Name: Eddie Calhoun MRN: [id number] DOB: [birth year] Today's Date: 07/02/2021   History of Present Illness 71 yo male presenting to ED on 2/25 with self sided weakness. TNK given 2/25. 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 PMH including BPH, HTN, and thrombocytopenia.   Clinical Impression   PTA, pt was living with his wife and was independent. Pt currently requiring Mod A for UB ADLs, Max A for LB ADLs, and Mod A for short distance mobility.  Pt presenting with decreased coordination, balance, strength, and functional use of LUE (dominant hand). Pt very motivated to participate in therapy. Pt would benefit from further acute OT to facilitate safe dc. Due to pt's PLOF, motivation, and good support, recommend dc to AIR for further OT to optimize safety, independence with ADLs, and return to PLOF.      Recommendations for follow up therapy are one component of a multi-disciplinary discharge planning process, led by the attending physician.  Recommendations may be updated based on patient status, additional functional criteria and insurance authorization.   Follow Up Recommendations  Acute inpatient rehab (3hours/day)    Assistance Recommended at Discharge Frequent or constant Supervision/Assistance  Patient can return home with the following A lot of help with walking and/or transfers;A lot of help with bathing/dressing/bathroom    Functional Status Assessment  Patient has had a recent decline in their functional status and demonstrates the ability to make significant improvements in function in a reasonable and predictable amount of time.  Equipment Recommendations  Other (comment) (Defer to next venue)    Recommendations for Other Services PT consult     Precautions / Restrictions Precautions Precautions: Fall      Mobility Bed Mobility Overal bed mobility: Needs Assistance Bed  Mobility: Supine to Sit     Supine to sit: Min assist, HOB elevated     General bed mobility comments: Min A to elevate trunk. Increased time to bring LLE towards EOB    Transfers Overall transfer level: Needs assistance Equipment used: Rolling walker (2 wheels) Transfers: Sit to/from Stand Sit to Stand: Mod assist           General transfer comment: Mod A for power up and correcting balance with L left. blocking throughout for L knee      Balance Overall balance assessment: Needs assistance Sitting-balance support: No upper extremity supported, Feet supported Sitting balance-Leahy Scale: Fair     Standing balance support: Bilateral upper extremity supported, During functional activity Standing balance-Leahy Scale: Poor Standing balance comment: left lateral lean and buckling of L knee                           ADL either performed or assessed with clinical judgement   ADL Overall ADL's : Needs assistance/impaired Eating/Feeding: Set up;Sitting   Grooming: Minimal assistance;Sitting   Upper Body Bathing: Moderate assistance;Sitting   Lower Body Bathing: Maximal assistance;Sit to/from stand   Upper Body Dressing : Moderate assistance;Sitting   Lower Body Dressing: Maximal assistance;Sit to/from stand   Toilet Transfer: Moderate assistance;Rolling walker (2 wheels);Ambulation Toilet Transfer Details (indicate cue type and reason): Mod A for maintaining balance with L left and sequencing LLE; blocking throughout for L knee         Functional mobility during ADLs: Moderate assistance;Rolling walker (2 wheels) General ADL Comments: Pt presenting with left sided weakness, decreased coordiantion, and left lateral leaning  Vision Baseline Vision/History: 1 Wears glasses       Perception     Praxis      Pertinent Vitals/Pain Pain Assessment Pain Assessment: No/denies pain     Hand Dominance Left   Extremity/Trunk Assessment Upper  Extremity Assessment Upper Extremity Assessment: LUE deficits/detail LUE Deficits / Details: Able to perform digit ROM (decreased opposition to fifth digit), wrist ROM, supination/prontation, and elbow ROM with increased time and effort. Limited shoulder ROM to 30* forward flexion and 50* abduction LUE Coordination: decreased gross motor;decreased fine motor   Lower Extremity Assessment Lower Extremity Assessment: Defer to PT evaluation   Cervical / Trunk Assessment Cervical / Trunk Assessment: Other exceptions Cervical / Trunk Exceptions: L side weakness   Communication Communication Communication: No difficulties   Cognition Arousal/Alertness: Awake/alert Behavior During Therapy: WFL for tasks assessed/performed Overall Cognitive Status: Impaired/Different from baseline Area of Impairment: Problem solving, Following commands                       Following Commands: Follows multi-step commands inconsistently, Follows multi-step commands with increased time     Problem Solving: Slow processing, Requires verbal cues, Difficulty sequencing General Comments: Very motivated. Pt able to follow one step comments. With fatigue and during mobility, pt requiring increased time, verbal cues, adn tactile cues to sequence and follow commands.     General Comments  Wife present throughout. VSS on RA    Exercises Exercises: Other exercises Other Exercises Other Exercises: provided education on ROM exercises for L hand including composite flex/ext, opposition, and digit lifting (one at a time).   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 Very active.        OT Problem List: Decreased strength;Decreased range of motion;Decreased activity tolerance;Impaired balance (sitting and/or standing);Decreased knowledge of use of DME or AE;Decreased knowledge of precautions      OT Treatment/Interventions: Self-care/ADL training;Therapeutic exercise;Energy conservation;DME and/or AE instruction;Therapeutic activities;Patient/family education    OT Goals(Current goals can be found in the care plan section) Acute Rehab OT Goals Patient Stated Goal: Go home OT Goal Formulation: With patient Time For Goal Achievement: 07/16/21 Potential to Achieve Goals: Good  OT Frequency: Min 2X/week    Co-evaluation              AM-PAC OT "6 Clicks" Daily Activity     Outcome Measure Help from another person eating meals?: None Help from another person taking care of personal grooming?: A Little Help from another person toileting, which includes using toliet, bedpan, or urinal?: A Lot Help from another person bathing (including washing, rinsing, drying)?: A Lot Help from another person to put on and taking off regular upper body clothing?: A Lot Help from another person to put on and taking off regular lower body clothing?: A Lot 6 Click Score: 15   End of Session Equipment  Utilized During Treatment: Rolling walker (2 wheels);Gait belt Nurse Communication: Mobility status  Activity Tolerance: Patient tolerated treatment well Patient left: in chair;with call bell/phone within reach;with chair alarm set;with family/visitor present  OT Visit Diagnosis: Unsteadiness on feet (R26.81);Other abnormalities of gait and mobility (R26.89);Muscle weakness (generalized)  (M62.81)                Time: 3244-0102 OT Time Calculation (min): 35 min Charges:  OT General Charges $OT Visit: 1 Visit OT Evaluation $OT Eval Moderate Complexity: 1 Mod OT Treatments $Self Care/Home Management : 8-22 mins  Dempsey Ahonen MSOT, OTR/L Acute Rehab Pager: [phone] Office: [place] 07/02/2021, 12:41 PM

## 2021-07-02 NOTE — Progress Notes (Signed)
Interim progress note Reported by RN that patient has been noticing some spasms in the left arm. Reports that his symptoms that started to improve to the point that the RN had reported an NIH of 2. Fluctuating exam with left arm weakness. On my examination, alert awake oriented x3.  May be slight left nasolabial fold flattening which I previously did not observe.  Left upper extremity has mild drift.  Left lower extremity has mild drift-NIH 3. 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 Since vessel imaging was not done because of his allergy to iodinated contrast, I will order a stat MRI head, MRA head/neck to better evaluate the stroke and location. Will follow  -- Amie [place], MD Neurologist [facility] Pager: [phone]  Additional 15 minutes of CC time.

## 2021-07-02 NOTE — Progress Notes (Signed)
OT Cancellation Note  Patient Details Name: Eddie Calhoun MRN: [id number] DOB: [birth year]   Cancelled Treatment:    Reason Eval/Treat Not Completed: Active bedrest order (Will return as schedule allows.)  [place], OTR/L Acute Rehab Pager: [phone] Office: [phone] 07/02/2021, 7:07 AM

## 2021-07-02 NOTE — Progress Notes (Signed)
°  Transition of Care [facility]) Screening Note   Patient Details  Name: Eddie Calhoun Date of Birth: [birth year]   Transition of Care [facility]) CM/SW Contact:    Alfredia Ferguson, LCSW Phone Number: 07/02/2021, 8:08 AM    Transition of Care Department [facility]) has reviewed patient and noted no immediate TOC needs pending continued medical work-up. TOC team will continue to monitor patient advancement through interdisciplinary progression rounds to support any identified discharge supports as needed. If new patient transition needs arise, please place a TOC consult or reach out to [facility] team.

## 2021-07-02 NOTE — Progress Notes (Signed)
Inpatient Rehab Admissions Coordinator Note:   Per PT/OT patient was screened for CIR candidacy by [place] Ne[place] Danford Bad, CCC-SLP. At this time, pt appears to be a potential candidate for CIR. I will place an order for rehab consult for full assessment, per our protocol.  Please contact me any with questions.Gayland Curry, [place], [place] Admissions Coordinator [phone] 07/02/21 6:01 PM

## 2021-07-02 NOTE — Progress Notes (Addendum)
STROKE TEAM PROGRESS NOTE   INTERVAL HISTORY Initially after TNK his symptoms improved and then reoccurred overnight.  Muscle twitches and spasms overnight, weakness in left arm and left leg drift.  He is noticing difficulty picking up objects with his left hand and he is left-handed.  Vitals:   07/02/21 0530 07/02/21 0645 07/02/21 0700 07/02/21 0800  BP: 135/86 138/85 140/80 (!) 135/99  Pulse: (!) 58 (!) 58 (!) 55 (!) 57  Resp: 17 17 12    Temp:      TempSrc:      SpO2: 97%  96% 95%  Weight:      Height:       CBC:  Recent Labs  Lab 07/01/21 2221 07/01/21 2225  WBC 4.0  --   NEUTROABS 2.4  --   HGB 12.7* 12.6*  HCT 37.3* 37.0*  MCV 97.9  --   PLT 120*  --    Basic Metabolic Panel:  Recent Labs  Lab 07/01/21 2221 07/01/21 2225  NA 139 144  K 3.9 4.0  CL 113* 111  CO2 20*  --   GLUCOSE 108* 104*  BUN 19 20  CREATININE 1.26* 1.20  CALCIUM 8.6*  --    Lipid Panel:  Recent Labs  Lab 07/02/21 0134  CHOL 136  TRIG 65  HDL 44  CHOLHDL 3.1  VLDL 13  LDLCALC 79   HgbA1c:  Recent Labs  Lab 07/02/21 0134  HGBA1C 5.5   Urine Drug Screen: No results for input(s): LABOPIA, COCAINSCRNUR, LABBENZ, AMPHETMU, THCU, LABBARB in the last 168 hours.  Alcohol Level  Recent Labs  Lab 07/01/21 2221  ETH <10    IMAGING past 24 hours MR ANGIO HEAD WO CONTRAST  Result Date: 07/02/2021 CLINICAL DATA:  Stroke follow-up EXAM: MRI HEAD WITHOUT CONTRAST MRA HEAD WITHOUT CONTRAST MRA OF THE NECK WITHOUT AND WITH CONTRAST TECHNIQUE: Multiplanar, multi-echo pulse sequences of the brain and surrounding structures were acquired without intravenous contrast. Angiographic images of the Circle of Willis were acquired using MRA technique without intravenous contrast. Angiographic images of the neck were acquired using MRA technique without and with intravenous contrast. Carotid stenosis measurements (when applicable) are obtained utilizing NASCET criteria, using the distal internal carotid  diameter as the denominator. CONTRAST:  13mL GADAVIST GADOBUTROL 1 MMOL/ML IV SOLN COMPARISON:  Head CT from yesterday FINDINGS: MR HEAD FINDINGS Brain: Band of restricted diffusion at the posterior right putamen, corona radiata, and posterior caudate nucleus. No pre-existing ischemic injury. No hemorrhage, hydrocephalus, or collection Vascular: Arterial findings below Skull and upper cervical spine: Normal marrow signal. Facet spurring asymmetric to the left at C4-5. Sinuses/Orbits: Negative MRA HEAD FINDINGS Anterior circulation: Aplastic left A1 segment with fetal type left PCA. No branch occlusion, beading, or aneurysm. Negative for significant stenosis. Posterior circulation: Left dominant. The vertebral and basilar arteries are smoothly contoured and widely patent. No PCA branch occlusion. Minor right P3/4 segment stenosis. MRA NECK FINDINGS Antegrade flow in the carotid and vertebral arteries by time-of-flight. Unremarkable neck mask. Normal appearance of the arch and great vessels. Proximal subclavian, vertebral, and carotid arteries are smoothly contoured and widely patent on both sides. 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 Intracranial MRA: No emergent finding. No branch occlusion or stenosis to correlate with the infarct. Neck MRA: Negative. Electronically Signed   By: Jorje Guild M.D.   On: 07/02/2021 07:13   MR ANGIO NECK W WO CONTRAST  Result Date: 07/02/2021 CLINICAL DATA:  Stroke follow-up EXAM: MRI HEAD WITHOUT CONTRAST MRA HEAD WITHOUT CONTRAST MRA OF THE NECK WITHOUT AND WITH CONTRAST TECHNIQUE: Multiplanar, multi-echo pulse sequences of the brain and surrounding structures were acquired without intravenous contrast. Angiographic images of the Circle of Willis were acquired using MRA technique without intravenous contrast. Angiographic images of the neck were acquired using MRA technique without and with intravenous contrast. Carotid  stenosis measurements (when applicable) are obtained utilizing NASCET criteria, using the distal internal carotid diameter as the denominator. CONTRAST:  65mL GADAVIST GADOBUTROL 1 MMOL/ML IV SOLN COMPARISON:  Head CT from yesterday FINDINGS: MR HEAD FINDINGS Brain: Band of restricted diffusion at the posterior right putamen, corona radiata, and posterior caudate nucleus. No pre-existing ischemic injury. No hemorrhage, hydrocephalus, or collection Vascular: Arterial findings below Skull and upper cervical spine: Normal marrow signal. Facet spurring asymmetric to the left at C4-5. Sinuses/Orbits: Negative MRA HEAD FINDINGS Anterior circulation: Aplastic left A1 segment with fetal type left PCA. No branch occlusion, beading, or aneurysm. Negative for significant stenosis. Posterior circulation: Left dominant. The vertebral and basilar arteries are smoothly contoured and widely patent. No PCA branch occlusion. Minor right P3/4 segment stenosis. MRA NECK FINDINGS Antegrade flow in the carotid and vertebral arteries by time-of-flight. Unremarkable neck mask. Normal appearance of the arch and great vessels. Proximal subclavian, vertebral, and carotid arteries are smoothly contoured and widely patent on both sides. 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 Intracranial MRA: No emergent finding. No branch occlusion or stenosis to correlate with the infarct. Neck MRA: Negative. Electronically Signed   By: Jorje Guild M.D.   On: 07/02/2021 07:13   MR BRAIN WO CONTRAST  Result Date: 07/02/2021 CLINICAL DATA:  Stroke follow-up EXAM: MRI HEAD WITHOUT CONTRAST MRA HEAD WITHOUT CONTRAST MRA OF THE NECK WITHOUT AND WITH CONTRAST TECHNIQUE: Multiplanar, multi-echo pulse sequences of the brain and surrounding structures were acquired without intravenous contrast. Angiographic images of the Circle of Willis were acquired using MRA technique without intravenous contrast.  Angiographic images of the neck were acquired using MRA technique without and with intravenous contrast. Carotid stenosis measurements (when applicable) are obtained utilizing NASCET criteria, using the distal internal carotid diameter as the denominator. CONTRAST:  54mL GADAVIST GADOBUTROL 1 MMOL/ML IV SOLN COMPARISON:  Head CT from yesterday FINDINGS: MR HEAD FINDINGS Brain: Band of restricted diffusion at the posterior right putamen, corona radiata, and posterior caudate nucleus. No pre-existing ischemic injury. No hemorrhage, hydrocephalus, or collection Vascular: Arterial findings below Skull and upper cervical spine: Normal marrow signal. Facet spurring asymmetric to the left at C4-5. Sinuses/Orbits: Negative MRA HEAD FINDINGS Anterior circulation: Aplastic left A1 segment with fetal type left PCA. No branch occlusion, beading, or aneurysm. Negative for significant stenosis. Posterior circulation: Left dominant. The vertebral and basilar arteries are smoothly contoured and widely patent. No PCA branch occlusion. Minor right P3/4 segment stenosis. MRA NECK FINDINGS Antegrade flow in the carotid and vertebral arteries by time-of-flight. Unremarkable neck mask. Normal appearance of the arch and great vessels. Proximal subclavian, vertebral, and carotid arteries are smoothly contoured and widely patent on both sides. 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 Intracranial MRA: No emergent finding. No branch occlusion or stenosis to correlate with the infarct. Neck MRA: Negative. Electronically Signed   By: Jorje Guild M.D.   On: 07/02/2021 07:13   CT HEAD CODE STROKE WO CONTRAST  Result Date: 07/01/2021 CLINICAL DATA:  Code stroke.  Left-sided  weakness and numbness. EXAM: CT HEAD WITHOUT CONTRAST TECHNIQUE: Contiguous axial images were obtained from the base of the skull through the vertex without intravenous contrast. RADIATION DOSE REDUCTION: This exam  was performed according to the departmental dose-optimization program which includes automated exposure control, adjustment of the mA and/or kV according to patient size and/or use of iterative reconstruction technique. COMPARISON:  None. FINDINGS: Brain: No evidence of acute infarction, hemorrhage, cerebral edema, mass, mass effect, or midline shift. Ventricles and sulci are normal for age. No extra-axial fluid collection. Vascular: No hyperdense vessel or unexpected calcification. Skull: Normal. Negative for fracture or focal lesion. Sinuses/Orbits: No acute finding. Other: The mastoid air cells are well aerated. ASPECTS [facility] Stroke Program Early CT Score) - Ganglionic level infarction (caudate, lentiform nuclei, internal capsule, insula, M1-M3 cortex): 7 - Supraganglionic infarction (M4-M6 cortex): 3 Total score (0-10 with 10 being normal): 10 IMPRESSION: 1. No acute intracranial process. 2. ASPECTS is 10 Code stroke imaging results were communicated on 07/01/2021 at 10:27 pm to provider Dr. Rory Percy via secure text paging. Electronically Signed   By: Merilyn Baba M.D.   On: 07/01/2021 22:27    PHYSICAL EXAM  Physical Exam  Constitutional: Appears well-developed and well-nourished.  Cardiovascular: Normal rate and regular rhythm.  Respiratory: Effort normal, non-labored breathing  Neuro: Mental Status: Patient is awake, alert, oriented to person, place, month, year, and situation. Patient is able to give a clear and coherent history. No signs of aphasia or neglect Cranial Nerves: II: Visual Fields are full. Pupils are equal, round, and reactive to light.   III,IV, VI: EOMI without ptosis or diploplia.  V: Facial sensation is symmetric to temperature VII: Slight left facial droop VIII: Hearing is intact to voice X: Palate elevates symmetrically XI: Shoulder shrug is symmetric. XII: Tongue protrudes midline without atrophy or fasciculations.  Motor: Tone is normal. Bulk is normal. RUE and  RLE 5/5 LUE and LLE 4/5 with mild drift Left hand grasp weak in comparison to right, fine motor control diminished in left fingers Sensory: Sensation is symmetric to light touch and temperature in the arms and legs. No extinction to DSS present.  He notices diminished sensation/mild numbness when he grasps objects in his left hand, he does not notice this in his arms or legs. Cerebellar: No ataxia out of proportion to weaknesa   ASSESSMENT/PLAN Eddie Calhoun is a 71 y.o. male with history of HTN, recent bil carpal tunnel surgeries, BPH, seasonal allergies presenting with left-sided heaviness involving the face, arm, and leg that progressed to complete inability to raise the arm or leg. Received TNKase at 2227. He is allergic to IV contrast.  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  Overnight he had a fluctuating weakness and spasms in the left arm.  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 Repeat CT scheduled for tonight.   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 Small vessel disease. Atrophy. ASPECTS 10.    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 aplastic left A1 segment with fetal type left PCA.  No branch occlusion, negative for significant stenosis.  Minor right P3 P4 segment stenosis.  2D Echo EF is 60-65% left left ventricle has normal function.  Mild mitral valve regurgitation right and left atria normal in size LDL 79 HgbA1c 5.5 VTE prophylaxis -  SCDs aspirin 81 mg daily prior to admission, now on No antithrombotic. To receive  ASA 24 hours post TNKase after repeat imaging to ensure no HT Therapy recommendations:  CIR Disposition:  pending  Hypertension Home meds:  olmesartan Stable Permissive hypertension (OK if < 220/120) but gradually normalize in 5-7 days Long-term BP goal  normotensive  Hyperlipidemia Home meds:  rosuvastatin 10mg  LDL 79, goal < 70 Increase rosuvastatin to 20mg   Continue statin at discharge  Other Stroke Risk Factors Advanced Age >/= 46   Other Active Problems Recent bilateral carpal tunnel surgeries No pain, numbness, or tingling in right hand, wrist, or arm. Left sided symptoms can be attributed to his stroke.  Hospital day # 1  Patient seen and examined by NP/APP with MD. MD to update note as needed.   Eddie Ores, DNP, FNP-BC [facility] Pager: [phone]  ATTENDING ATTESTATION:  71 year old gentleman history of high blood pressure and acute onset symptoms of left-sided weakness.  Had tPA within 9 minutes of presentation to the ED by Dr. Malen Gauze which is extraordinary.  He is doing well as a result of this.  He has some residual mild left facial weakness and left-sided drift.  He had some waxing and waning symptoms stat MRI was completed for this reason.  No antiplatelets for now.  Head CT planned for later today for post TNK evaluation for bleeding.  Transition to floor tomorrow.  Dr. Reeves Forth evaluated pt independently, reviewed imaging, chart, labs. Discussed and formulated plan with the APP. Please see APP note above for details.      This patient is critically ill due to stroke s/p tPA and at significant risk of neurological worsening, death form heart failure, respiratory failure, recurrent stroke, bleeding from [facility], seizure, sepsis. This patient's care requires constant monitoring of vital signs, hemodynamics, respiratory and cardiac monitoring, review of multiple databases, neurological assessment, discussion with family, other specialists and medical decision making of high complexity. I spent 35 minutes of neurocritical care time in the care of this patient.   Queena Monrreal,MD    To contact Stroke Continuity provider, please refer to [email or web address] After hours, contact General Neurology

## 2021-07-03 ENCOUNTER — Encounter (HOSPITAL_COMMUNITY): Payer: Self-pay | Admitting: Student in an Organized Health Care Education/Training Program

## 2021-07-03 LAB — BASIC METABOLIC PANEL
Anion gap: 6 (ref 5–15)
BUN: 19 mg/dL (ref 8–23)
CO2: 23 mmol/L (ref 22–32)
Calcium: 8.7 mg/dL — ABNORMAL LOW (ref 8.9–10.3)
Chloride: 111 mmol/L (ref 98–111)
Creatinine, Ser: 1.14 mg/dL (ref 0.61–1.24)
GFR, Estimated: >60 mL/min (ref >60)
Glucose, Bld: 104 mg/dL — ABNORMAL HIGH (ref 70–99)
Potassium: 3.9 mmol/L (ref 3.5–5.1)
Sodium: 140 mmol/L (ref 135–145)

## 2021-07-03 LAB — CBC
HCT: 35.1 % — ABNORMAL LOW (ref 39.0–52.0)
Hemoglobin: 12.3 g/dL — ABNORMAL LOW (ref 13.0–17.0)
MCH: 33.6 pg (ref 26.0–34.0)
MCHC: 35 g/dL (ref 30.0–36.0)
MCV: 95.9 fL (ref 80.0–100.0)
Platelets: 101 10*3/uL — ABNORMAL LOW (ref 150–400)
RBC: 3.66 MIL/uL — ABNORMAL LOW (ref 4.22–5.81)
RDW: 12.5 % (ref 11.5–15.5)
WBC: 4.4 10*3/uL (ref 4.0–10.5)
nRBC: 0 % (ref 0.0–0.2)

## 2021-07-03 MED ORDER — CLOPIDOGREL BISULFATE 75 MG PO TABS
75.0000 mg | ORAL_TABLET | Freq: Every day | ORAL | Status: DC
  Administered 2021-07-03 – 2021-07-04 (×2): 75 mg via ORAL
  Filled 2021-07-03: qty 1

## 2021-07-03 MED ORDER — PANTOPRAZOLE SODIUM 40 MG PO TBEC
40.0000 mg | DELAYED_RELEASE_TABLET | Freq: Every day | ORAL | Status: DC
  Filled 2021-07-03: qty 1

## 2021-07-03 NOTE — Plan of Care (Signed)
Problem: Education: Goal: Knowledge of disease or condition will improve Outcome: Progressing Goal: Knowledge of secondary prevention will improve (SELECT ALL) Outcome: Progressing Goal: Knowledge of patient specific risk factors will improve (INDIVIDUALIZE FOR PATIENT) Outcome: Progressing   Problem: Coping: Goal: Will verbalize positive feelings about self Outcome: Progressing Goal: Will identify appropriate support needs Outcome: Progressing   Problem: Health Behavior/Discharge Planning: Goal: Ability to manage health-related needs will improve Outcome: Progressing   Problem: Self-Care: Goal: Ability to participate in self-care as condition permits will improve Outcome: Progressing Goal: Verbalization of feelings and concerns over difficulty with self-care will improve Outcome: Progressing Goal: Ability to communicate needs accurately will improve Outcome: Progressing   Problem: Ischemic Stroke/TIA Tissue Perfusion: Goal: Complications of ischemic stroke/TIA will be minimized Outcome: Progressing   Problem: Nutrition: Goal: Risk of aspiration will decrease Outcome: Completed/Met Goal: Dietary intake will improve Outcome: Completed/Met

## 2021-07-03 NOTE — Progress Notes (Signed)
Physical Therapy Treatment Patient Details Name: Eddie Calhoun MRN: [id number] DOB: 1951-01-22 Today's Date: 07/03/2021   History of Present Illness 71 yo male presenting to ED on 2/25 with self sided weakness. TNK given 2/25. 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 PMH including BPH, HTN, and thrombocytopenia.    PT Comments    Pt was able to ambulate in the hallway with mod assist for balance and to facilitate hips over feet to reduce hyperextention of L knee. Pt also performed exercises to improve balance and reeducate muscles on L LE. During treatment session patient showed deficits in strength, endurance, activity tolerance. Recommending therapy services at acute inpatient rehab to address the previously stated deficits. Will continue to follow acutely to maximize functional mobility, independence, and safety.   Recommendations for follow up therapy are one component of a multi-disciplinary discharge planning process, led by the attending physician.  Recommendations may be updated based on patient status, additional functional criteria and insurance authorization.  Follow Up Recommendations  Acute inpatient rehab (3hours/day)     Assistance Recommended at Discharge Intermittent Supervision/Assistance  Patient can return home with the following A little help with walking and/or transfers;A lot of help with bathing/dressing/bathroom;Assistance with cooking/housework;Assist for transportation;Help with stairs or ramp for entrance   Equipment Recommendations  Rolling walker (2 wheels)    Recommendations for Other Services Rehab consult     Precautions / Restrictions Precautions Precautions: Fall Restrictions Weight Bearing Restrictions: No     Mobility  Bed Mobility Overal bed mobility: Needs Assistance         Sit to supine: Min assist   General bed mobility comments: pt required min assist for sit to supine to manage his  legs.    Transfers Overall transfer level: Needs assistance Equipment used: Rolling walker (2 wheels) Transfers: Sit to/from Stand, Bed to chair/wheelchair/BSC Sit to Stand: Min assist   Step pivot transfers: Min assist       General transfer comment: Pt require min assistance standing up from the recliner as well as cuing for posture.    Ambulation/Gait Ambulation/Gait assistance: Mod assist Gait Distance (Feet): 80 Feet Assistive device: Rolling walker (2 wheels) Gait Pattern/deviations: Step-to pattern, Knee hyperextension - left, Decreased dorsiflexion - left, Decreased stride length       General Gait Details: Pt required mod assist with gait to help proximal stabilization of the hips in order to keep his pelvis over his foot. Ace wrap was used to maintain dorsiflexion during gait.   Stairs             Wheelchair Mobility    Modified Rankin (Stroke Patients Only)       Balance Overall balance assessment: Modified Independent Sitting-balance support: No upper extremity supported, Feet supported Sitting balance-Leahy Scale: Good     Standing balance support: Bilateral upper extremity supported, Reliant on assistive device for balance Standing balance-Leahy Scale: Poor Standing balance comment: Static balance with bil support required min guard.               High Level Balance Comments: Pt was able to weight shift side to side and lift contralateral leg up with Bil UE support on walker and and min assist for balance.            Cognition Arousal/Alertness: Awake/alert Behavior During Therapy: WFL for tasks assessed/performed Overall Cognitive Status: Within Functional Limits for tasks assessed  General Comments: Pt was motivated and looking forward to physical therapy today.        Exercises Other Exercises Other Exercises: R hand reaching from walker to therapist hand up and to the left (cross  body) to activation of L LE musculature. x8    General Comments        Pertinent Vitals/Pain Pain Assessment Pain Assessment: Faces Faces Pain Scale: Hurts little more Pain Location: In R wrist/hand from bearing weight on the walker from carpel tunnel surgery Pain Descriptors / Indicators: Discomfort    Home Living                          Prior Function            PT Goals (current goals can now be found in the care plan section)      Frequency    Min 4X/week      PT Plan Current plan remains appropriate    Co-evaluation              AM-PAC PT "6 Clicks" Mobility   Outcome Measure  Help needed turning from your back to your side while in a flat bed without using bedrails?: A Little Help needed moving from lying on your back to sitting on the side of a flat bed without using bedrails?: A Little Help needed moving to and from a bed to a chair (including a wheelchair)?: A Little Help needed standing up from a chair using your arms (e.g., wheelchair or bedside chair)?: A Little Help needed to walk in hospital room?: A Little Help needed climbing 3-5 steps with a railing? : Total 6 Click Score: 16    End of Session Equipment Utilized During Treatment: Gait belt Activity Tolerance: Patient tolerated treatment well Patient left: in bed;with call bell/phone within reach;with bed alarm set   PT Visit Diagnosis: Other abnormalities of gait and mobility (R26.89);Muscle weakness (generalized) (M62.81);Other symptoms and signs involving the nervous system (R29.898);Hemiplegia and hemiparesis Hemiplegia - Right/Left: Left Hemiplegia - dominant/non-dominant: Dominant Hemiplegia - caused by: Cerebral infarction     Time: 1607-3710 PT Time Calculation (min) (ACUTE ONLY): 42 min  Charges:  $Gait Training: 8-22 mins $Therapeutic Activity: 8-22 mins $Neuromuscular Re-education: 8-22 mins                     Quenton Fetter, SPT    Quenton Fetter 07/03/2021,  3:54 PM

## 2021-07-03 NOTE — Progress Notes (Addendum)
STROKE TEAM PROGRESS NOTE   INTERVAL HISTORY Patient is seen in his room with family members at the bedside.  He has been hemodynamically stable overnight, and his neurological exam is unchanged.  He is ready to transfer out of the ICU and will likely go to CIR.  Vitals:   07/03/21 0600 07/03/21 0700 07/03/21 0800 07/03/21 1200  BP: 108/79 120/78    Pulse: (!) 49 (!) 46    Resp: 13 10    Temp:   98 F (36.7 C) 97.6 F (36.4 C)  TempSrc:   Oral Oral  SpO2: 94% 95%    Weight:      Height:       CBC:  Recent Labs  Lab 07/01/21 2221 07/01/21 2225 07/03/21 0500  WBC 4.0  --  4.4  NEUTROABS 2.4  --   --   HGB 12.7* 12.6* 12.3*  HCT 37.3* 37.0* 35.1*  MCV 97.9  --  95.9  PLT 120*  --  101*    Basic Metabolic Panel:  Recent Labs  Lab 07/01/21 2221 07/01/21 2225 07/03/21 0500  NA 139 144 140  K 3.9 4.0 3.9  CL 113* 111 111  CO2 20*  --  23  GLUCOSE 108* 104* 104*  BUN 19 20 19   CREATININE 1.26* 1.20 1.14  CALCIUM 8.6*  --  8.7*    Lipid Panel:  Recent Labs  Lab 07/02/21 0134  CHOL 136  TRIG 65  HDL 44  CHOLHDL 3.1  VLDL 13  LDLCALC 79    HgbA1c:  Recent Labs  Lab 07/02/21 0134  HGBA1C 5.5    Urine Drug Screen: No results for input(s): LABOPIA, COCAINSCRNUR, LABBENZ, AMPHETMU, THCU, LABBARB in the last 168 hours.  Alcohol Level  Recent Labs  Lab 07/01/21 2221  ETH <10     IMAGING past 24 hours CT HEAD WO CONTRAST (5MM)  Result Date: 07/02/2021 CLINICAL DATA:  Stroke follow-up. EXAM: CT HEAD WITHOUT CONTRAST TECHNIQUE: Contiguous axial images were obtained from the base of the skull through the vertex without intravenous contrast. RADIATION DOSE REDUCTION: This exam was performed according to the departmental dose-optimization program which includes automated exposure control, adjustment of the mA and/or kV according to patient size and/or use of iterative reconstruction technique. COMPARISON:  CT dated 07/01/2021. MRI dated 07/02/2021 FINDINGS:  Brain: Faint area of hypodensity involving the posterior right basal ganglia (20/4) may correspond to the infarcts seen on the MRI. The ventricles and sulci are appropriate size for patient's age. The gray-white matter discrimination is otherwise preserved. There is no acute intracranial hemorrhage. No mass effect or midline shift. No extra-axial fluid collection. Vascular: No hyperdense vessel or unexpected calcification. Skull: Normal. Negative for fracture or focal lesion. Sinuses/Orbits: No acute finding. Other: None IMPRESSION: 1. No acute intracranial hemorrhage. 2. Faint area of hypodensity involving the posterior right basal ganglia may correspond to the infarcts seen on the MRI. Electronically Signed   By: Anner Crete M.D.   On: 07/02/2021 21:20    PHYSICAL EXAM  Physical Exam  Constitutional: Appears well-developed and well-nourished.  Cardiovascular: Normal rate and regular rhythm.  Respiratory: Effort normal, non-labored breathing  Neuro: Mental Status: Patient is awake, alert, oriented to person, place, month, year, and situation. Patient is able to give a clear and coherent history. No signs of aphasia or neglect Cranial Nerves: II: Visual Fields are full. Pupils are equal, round, and reactive to light.   III,IV, VI: EOMI without ptosis or diploplia.  V: Facial  sensation is symmetric to temperature VII: Slight left facial droop VIII: Hearing is intact to voice X: Palate elevates symmetrically XI: Shoulder shrug is symmetric. XII: Tongue protrudes midline without atrophy or fasciculations.  Motor: Tone is normal. Bulk is normal. RUE and RLE 5/5 LUE and LLE 4/5 with mild drift Left hand grasp weak in comparison to right,  Sensory: Sensation is symmetric to light touch in the arms and legs. No extinction to DSS present.  He notices diminished sensation/mild numbness when he grasps objects in his left hand, he does not notice this in his arms or legs. Cerebellar: No  ataxia out of proportion to weakness   ASSESSMENT/PLAN [name] is a 71 y.o. male with history of HTN, recent bil carpal tunnel surgeries, BPH, seasonal allergies presenting with left-sided heaviness involving the face, arm, and leg that progressed to complete inability to raise the arm or leg. Received TNKase at 2227. He is allergic to IV contrast.  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  Overnight he had a fluctuating weakness and spasms in the left arm.  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 Repeat CT shows no hemorrhage and faint area of hypodensity corresponding to infarcts on MRI.  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 Small vessel disease. Atrophy. ASPECTS 10.    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 aplastic left A1 segment with fetal type left PCA.  No branch occlusion, negative for significant stenosis.  Minor right P3 P4 segment stenosis.  2D Echo EF is 60-65% left left ventricle has normal function.  Mild mitral valve regurgitation right and left atria normal in size LDL 79 HgbA1c 5.5 VTE prophylaxis - SCDs aspirin 81 mg daily prior to admission, now on No antithrombotic. To receive  ASA 24 hours post TNKase after repeat imaging to ensure no HT Therapy recommendations:  CIR Disposition:  pending  Hypertension Home meds:  olmesartan Stable Permissive hypertension (OK if < 220/120) but gradually normalize in 5-7 days Long-term BP goal normotensive  Hyperlipidemia Home meds:  rosuvastatin 10mg  LDL 79, goal < 70 Increase rosuvastatin to 20mg   Continue statin at discharge  Other Stroke Risk Factors Advanced Age >/= 62   Other Active Problems Recent bilateral carpal tunnel surgeries No pain, numbness, or tingling in right hand, wrist, or arm. Left sided  symptoms can be attributed to his stroke.  Hospital day # 2  Patient seen and examined by NP/APP with MD. MD to update note as needed.   [place] , MSN, AGACNP-BC [facility] See Amion for schedule and pager information 07/03/2021 1:33 PM    ATTENDING ATTESTATION:  Dr. Reeves Forth evaluated pt independently, reviewed imaging, chart, labs. Discussed and formulated plan with the APP. Please see APP note above for details.   Total 36 minutes spent on counseling patient and coordinating care, writing notes and reviewing chart.  71 year old gentleman history of high blood pressure and acute onset symptoms of left-sided weakness s/p TNK. Post CT neg. Stated on aspirin. He has some residual mild left facial weakness and left-sided drift.  Floor bed today. CIR per PT recs.     Daisy Mcneel,MD    To contact Stroke Continuity provider, please refer to [email or web address] After hours, contact General Neurology

## 2021-07-03 NOTE — Progress Notes (Signed)
On-call overnight note Late entry note for service at 11 PM on 07/02/2021.  Overnight, 24 h post TNKase CT head was checked-no bleed.  Aspirin started. Transfer orders for telemetry floor placed in the chart this morning.  -- Amie [place], MD Neurologist [facility] Pager: [phone]

## 2021-07-03 NOTE — Progress Notes (Signed)
Inpatient Rehabilitation Admissions Coordinator   I met at bedside with patient and his wife for rehab assessment. We discussed goals and expectations of a  possible CIR admit . Patient would benefit from admit and they are in agreement. I await medical workup completion and bed availability when medically ready for discharge.  Danne Baxter, RN, MSN Rehab Admissions Coordinator [phone] 07/03/2021 12:27 PM

## 2021-07-03 NOTE — Evaluation (Signed)
Speech Language Pathology Evaluation Patient Details Name: Eddie Calhoun MRN: [id number] DOB: 1950/05/23 Today's Date: 07/03/2021 Time: 6759-1638 SLP Time Calculation (min) (ACUTE ONLY): 25 min  Problem List:  Patient Active Problem List   Diagnosis Date Noted   Acute ischemic stroke ([place]) 07/01/2021   Thrombocytopenia, unspecified ([place]) 07/26/2012   Past Medical History:  Past Medical History:  Diagnosis Date   Allergy    allergic rhinitis   BPH (benign prostatic hyperplasia)    Elevated prostate specific antigen (PSA)    Hypertension    Pneumonia    Thrombocytopenia, unspecified ([place]) 07/26/2012   03/24/12  129,000!   Past Surgical History: No past surgical history on file. HPI:  Pt is a 71 y/o male who presented to the ED on 2/25 with left-sided weakness. TNK given 2/25. MRI brain 2/26: acute perforator infarct at the right basal ganglia and corona radiata. PMH: BPH, HTN, and thrombocytopenia.   Assessment / Plan / Recommendation Clinical Impression  Pt participated in speech-language-cognition evaluation evaluation. Pt and his wife reported that the pt had difficulty with short term memory prior to admission. However, both parties denied any acute changes in speech, language, or cognition today. The [facility] Mental Status Examination was completed to evaluate the pt's cognitive-linguistic skills. He achieved a score of 27/30 which is within the normal limits of 27 or more out of 30. No speech or language deficits were noted and his performance on informal cognitive-linguistic tasks was within functional limits. Further skilled SLP services are not clinically indicated at this time. Pt, his family, and RN were educated regarding this and all parties verbalized understanding as well as agreement with plan of care.    SLP Assessment  SLP Recommendation/Assessment: Patient does not need any further Speech [place] [facility] SLP Visit Diagnosis: Cognitive  communication deficit (R41.841)    Recommendations for follow up therapy are one component of a multi-disciplinary discharge planning process, led by the attending physician.  Recommendations may be updated based on patient status, additional functional criteria and insurance authorization.    Follow Up Recommendations  No SLP follow up    Assistance Recommended at Discharge     Functional Status Assessment Patient has not had a recent decline in their functional status  Frequency and Duration           SLP Evaluation Cognition  Overall Cognitive Status: Within Functional Limits for tasks assessed Arousal/Alertness: Awake/alert Orientation Level: Oriented X4 Year: 2023 Month: February Day of Week: Correct Attention: Focused;Sustained Focused Attention: Appears intact Sustained Attention: Appears intact Memory: Impaired Memory Impairment: Storage deficit (Immediate: 5/5; delayed: 2/5; with cues: 3/3) Awareness: Appears intact Problem Solving: Appears intact Executive Function: Reasoning;Sequencing;Organizing Reasoning: Appears intact Sequencing: Appears intact (clock drawing: 4/4) Organizing: Appears intact (backward digit span: 2/2)       Comprehension  Auditory Comprehension Overall Auditory Comprehension: Appears within functional limits for tasks assessed Yes/No Questions: Within Functional Limits Commands: Within Functional Limits    Expression Expression Primary Mode of Expression: Verbal Verbal Expression Overall Verbal Expression: Appears within functional limits for tasks assessed Initiation: No impairment Level of Generative/Spontaneous Verbalization: Conversation Repetition: No impairment Naming: No impairment Pragmatics: No impairment Written Expression Written Expression: Within Functional Limits   Oral / Motor  Oral Motor/Sensory Function Overall Oral Motor/Sensory Function: Mild impairment Facial Strength: Reduced left;Suspected CN VII (facial)  dysfunction Facial Sensation: Within Functional Limits Motor Speech Overall Motor Speech: Appears within functional limits for tasks assessed Respiration: Within functional limits Phonation: Normal  Resonance: Within functional limits Articulation: Within functional limitis Intelligibility: Intelligible Motor Planning: Witnin functional limits Motor Speech Errors: Aware           Talisa Petrak I. Hardin Negus, [place], [place] Office number [phone] Pager [place] 07/03/2021, 12:03 PM

## 2021-07-04 ENCOUNTER — Encounter (HOSPITAL_COMMUNITY): Payer: Self-pay | Admitting: Student in an Organized Health Care Education/Training Program

## 2021-07-04 ENCOUNTER — Other Ambulatory Visit: Payer: Self-pay

## 2021-07-04 ENCOUNTER — Inpatient Hospital Stay (HOSPITAL_COMMUNITY)
Admission: RE | Admit: 2021-07-04 | Discharge: 2021-07-17 | DRG: 057 | Disposition: A | Discharge status: Home / Self Care | Payer: Medicare Other | Source: Intra-hospital | Attending: Physical Medicine and Rehabilitation | Admitting: Physical Medicine and Rehabilitation

## 2021-07-04 ENCOUNTER — Encounter (HOSPITAL_COMMUNITY): Payer: Self-pay | Admitting: Physical Medicine and Rehabilitation

## 2021-07-04 DIAGNOSIS — I10 Essential (primary) hypertension: Secondary | ICD-10-CM | POA: Diagnosis present

## 2021-07-04 DIAGNOSIS — N401 Enlarged prostate with lower urinary tract symptoms: Secondary | ICD-10-CM | POA: Diagnosis present

## 2021-07-04 DIAGNOSIS — I69392 Facial weakness following cerebral infarction: Secondary | ICD-10-CM

## 2021-07-04 DIAGNOSIS — Z91041 Radiographic dye allergy status: Secondary | ICD-10-CM

## 2021-07-04 DIAGNOSIS — Z87891 Personal history of nicotine dependence: Secondary | ICD-10-CM

## 2021-07-04 DIAGNOSIS — I6381 Other cerebral infarction due to occlusion or stenosis of small artery: Secondary | ICD-10-CM | POA: Diagnosis not present

## 2021-07-04 DIAGNOSIS — I69354 Hemiplegia and hemiparesis following cerebral infarction affecting left non-dominant side: Principal | ICD-10-CM

## 2021-07-04 DIAGNOSIS — Z79899 Other long term (current) drug therapy: Secondary | ICD-10-CM

## 2021-07-04 DIAGNOSIS — Z8249 Family history of ischemic heart disease and other diseases of the circulatory system: Secondary | ICD-10-CM

## 2021-07-04 DIAGNOSIS — R3915 Urgency of urination: Secondary | ICD-10-CM | POA: Diagnosis present

## 2021-07-04 DIAGNOSIS — R001 Bradycardia, unspecified: Secondary | ICD-10-CM | POA: Diagnosis present

## 2021-07-04 DIAGNOSIS — E785 Hyperlipidemia, unspecified: Secondary | ICD-10-CM | POA: Diagnosis present

## 2021-07-04 DIAGNOSIS — J309 Allergic rhinitis, unspecified: Secondary | ICD-10-CM | POA: Diagnosis present

## 2021-07-04 DIAGNOSIS — D696 Thrombocytopenia, unspecified: Secondary | ICD-10-CM | POA: Diagnosis present

## 2021-07-04 DIAGNOSIS — N179 Acute kidney failure, unspecified: Secondary | ICD-10-CM | POA: Diagnosis not present

## 2021-07-04 DIAGNOSIS — N400 Enlarged prostate without lower urinary tract symptoms: Secondary | ICD-10-CM | POA: Diagnosis present

## 2021-07-04 DIAGNOSIS — Z8042 Family history of malignant neoplasm of prostate: Secondary | ICD-10-CM

## 2021-07-04 LAB — CBC
HCT: 35.3 % — ABNORMAL LOW (ref 39.0–52.0)
Hemoglobin: 12.3 g/dL — ABNORMAL LOW (ref 13.0–17.0)
MCH: 33.3 pg (ref 26.0–34.0)
MCHC: 34.8 g/dL (ref 30.0–36.0)
MCV: 95.7 fL (ref 80.0–100.0)
Platelets: 102 10*3/uL — ABNORMAL LOW (ref 150–400)
RBC: 3.69 MIL/uL — ABNORMAL LOW (ref 4.22–5.81)
RDW: 12.5 % (ref 11.5–15.5)
WBC: 4.7 10*3/uL (ref 4.0–10.5)
nRBC: 0 % (ref 0.0–0.2)

## 2021-07-04 LAB — BASIC METABOLIC PANEL
Anion gap: 10 (ref 5–15)
BUN: 17 mg/dL (ref 8–23)
CO2: 23 mmol/L (ref 22–32)
Calcium: 8.8 mg/dL — ABNORMAL LOW (ref 8.9–10.3)
Chloride: 107 mmol/L (ref 98–111)
Creatinine, Ser: 1.15 mg/dL (ref 0.61–1.24)
GFR, Estimated: >60 mL/min (ref >60)
Glucose, Bld: 103 mg/dL — ABNORMAL HIGH (ref 70–99)
Potassium: 3.7 mmol/L (ref 3.5–5.1)
Sodium: 140 mmol/L (ref 135–145)

## 2021-07-04 MED ORDER — PANTOPRAZOLE SODIUM 40 MG PO TBEC
40.0000 mg | DELAYED_RELEASE_TABLET | Freq: Every day | ORAL | Status: DC
  Filled 2021-07-04 (×3): qty 1

## 2021-07-04 MED ORDER — ACETAMINOPHEN 325 MG PO TABS: 650.0000 mg | ORAL_TABLET | ORAL | 0 refills | Status: AC | PRN

## 2021-07-04 MED ORDER — ACETAMINOPHEN 160 MG/5ML PO SOLN
650.0000 mg | ORAL | Status: DC | PRN
Start: 2021-07-04 — End: 2021-07-17

## 2021-07-04 MED ORDER — ACETAMINOPHEN 650 MG RE SUPP: 650.0000 mg | RECTAL | Status: DC | PRN

## 2021-07-04 MED ORDER — CLOPIDOGREL BISULFATE 75 MG PO TABS
75.0000 mg | ORAL_TABLET | Freq: Every day | ORAL | 0 refills | Status: DC
Start: 2021-07-05 — End: 2021-07-17

## 2021-07-04 MED ORDER — ASPIRIN 81 MG PO TBEC
81.0000 mg | DELAYED_RELEASE_TABLET | Freq: Every day | ORAL | 11 refills | Status: DC
Start: 2021-07-05 — End: 2021-07-27

## 2021-07-04 MED ORDER — TAMSULOSIN HCL 0.4 MG PO CAPS
0.4000 mg | ORAL_CAPSULE | Freq: Every day | ORAL | Status: DC
  Administered 2021-07-04 – 2021-07-16 (×13): 0.4 mg via ORAL
  Filled 2021-07-04 (×13): qty 1

## 2021-07-04 MED ORDER — ASPIRIN EC 81 MG PO TBEC
81.0000 mg | DELAYED_RELEASE_TABLET | Freq: Every day | ORAL | Status: DC
  Administered 2021-07-05 – 2021-07-17 (×13): 81 mg via ORAL
  Filled 2021-07-04 (×14): qty 1

## 2021-07-04 MED ORDER — ROSUVASTATIN CALCIUM 20 MG PO TABS
20.0000 mg | ORAL_TABLET | Freq: Every day | ORAL | 1 refills | Status: DC
Start: 2021-07-05 — End: 2021-07-17

## 2021-07-04 MED ORDER — CLOPIDOGREL BISULFATE 75 MG PO TABS
75.0000 mg | ORAL_TABLET | Freq: Every day | ORAL | Status: DC
  Administered 2021-07-05 – 2021-07-17 (×13): 75 mg via ORAL
  Filled 2021-07-04 (×14): qty 1

## 2021-07-04 MED ORDER — ACETAMINOPHEN 325 MG PO TABS
650.0000 mg | ORAL_TABLET | ORAL | Status: DC | PRN
Start: 2021-07-04 — End: 2021-07-17

## 2021-07-04 MED ORDER — ROSUVASTATIN CALCIUM 20 MG PO TABS
20.0000 mg | ORAL_TABLET | Freq: Every day | ORAL | Status: DC
  Administered 2021-07-05 – 2021-07-17 (×13): 20 mg via ORAL
  Filled 2021-07-04 (×14): qty 1

## 2021-07-04 NOTE — TOC Transition Note (Signed)
Transition of Care [facility]) - CM/SW Discharge Note   Patient Details  Name: Eddie Calhoun MRN: [id number] Date of Birth: 1951-01-13  Transition of Care [facility]) CM/SW Contact:  Pollie Friar, RN Phone Number: 07/04/2021, 2:11 PM   Clinical Narrative:    Patient is discharging to CIR today. CM signing off.    Final next level of care: IP Rehab Facility Barriers to Discharge: No Barriers Identified   Patient Goals and CMS Choice     Choice offered to / list presented to : Patient  Discharge Placement                       Discharge Plan and Services                                     Social Determinants of Health (SDOH) Interventions     Readmission Risk Interventions No flowsheet data found.

## 2021-07-04 NOTE — Care Management Important Message (Signed)
Important Message  Patient Details  Name: Eddie Calhoun MRN: [id number] Date of Birth: July 07, 1950   Medicare Important Message Given:  Yes     Orbie Pyo 07/04/2021, 1:24 PM

## 2021-07-04 NOTE — Discharge Summary (Addendum)
Stroke Discharge Summary  Patient ID: Eddie Calhoun   MRN: [id number]      DOB: 23-Feb-1951  Date of Admission: 07/01/2021 Date of Discharge: 07/04/2021  Attending Physician:  Stroke, Md, MD, Stroke MD Consultant(s):   None Patient's PCP:  Josetta Huddle, MD  Discharge Diagnoses: Right MCA stroke Principal Problem:   Acute ischemic stroke [facility])   Medications to be continued on Rehab Allergies as of 07/04/2021       Reactions   Contrast Media [iodinated Contrast Media] Hives        Medication List     TAKE these medications    acetaminophen 325 MG tablet Commonly known as: TYLENOL Take 2 tablets (650 mg total) by mouth every 4 (four) hours as needed for mild pain (or temp > 37.5 C (99.5 F)).   aspirin 81 MG EC tablet Take 1 tablet (81 mg total) by mouth daily. Swallow whole. Start taking on: July 05, 2021   cholecalciferol 25 MCG (1000 UNIT) tablet Commonly known as: VITAMIN D3 Take 1,000 Units by mouth daily.   clopidogrel 75 MG tablet Commonly known as: PLAVIX Take 1 tablet (75 mg total) by mouth daily. Start taking on: July 05, 2021   multivitamin tablet Take 1 tablet by mouth daily.   olmesartan 40 MG tablet Commonly known as: BENICAR Take 1 tablet (40 mg total) by mouth daily.   rosuvastatin 20 MG tablet Commonly known as: CRESTOR Take 1 tablet (20 mg total) by mouth daily. Start taking on: July 05, 2021 What changed:  medication strength how much to take   tamsulosin 0.4 MG Caps capsule Commonly known as: FLOMAX Take 0.4 mg by mouth daily after supper.   vitamin B-12 1000 MCG tablet Commonly known as: CYANOCOBALAMIN Take 1,000 mcg by mouth daily.        LABORATORY STUDIES CBC    Component Value Date/Time   WBC 4.7 07/04/2021 0407   RBC 3.69 (L) 07/04/2021 0407   HGB 12.3 (L) 07/04/2021 0407   HGB 14.3 09/04/2012 0955   HCT 35.3 (L) 07/04/2021 0407   HCT 41.0 09/04/2012 0955   PLT 102 (L) 07/04/2021 0407   PLT 125 (L)  09/04/2012 0955   MCV 95.7 07/04/2021 0407   MCV 96.6 09/04/2012 0955   MCH 33.3 07/04/2021 0407   MCHC 34.8 07/04/2021 0407   RDW 12.5 07/04/2021 0407   RDW 12.4 09/04/2012 0955   LYMPHSABS 1.1 07/01/2021 2221   LYMPHSABS 0.8 (L) 09/04/2012 0955   MONOABS 0.3 07/01/2021 2221   MONOABS 0.3 09/04/2012 0955   EOSABS 0.2 07/01/2021 2221   EOSABS 0.2 09/04/2012 0955   BASOSABS 0.0 07/01/2021 2221   BASOSABS 0.0 09/04/2012 0955   CMP    Component Value Date/Time   NA 140 07/04/2021 0407   NA 139 09/04/2012 0955   K 3.7 07/04/2021 0407   K 5.0 09/04/2012 0955   CL 107 07/04/2021 0407   CL 105 09/04/2012 0955   CO2 23 07/04/2021 0407   CO2 26 09/04/2012 0955   GLUCOSE 103 (H) 07/04/2021 0407   GLUCOSE 103 (H) 09/04/2012 0955   BUN 17 07/04/2021 0407   BUN 30.7 (H) 09/04/2012 0955   CREATININE 1.15 07/04/2021 0407   CREATININE 1.4 (H) 09/04/2012 0955   CALCIUM 8.8 (L) 07/04/2021 0407   CALCIUM 9.4 09/04/2012 0955   PROT 6.4 (L) 07/01/2021 2221   PROT 7.4 09/04/2012 0955   ALBUMIN 3.6 07/01/2021 2221   ALBUMIN 3.7 09/04/2012  0955   AST 22 07/01/2021 2221   AST 23 09/04/2012 0955   ALT 24 07/01/2021 2221   ALT 30 09/04/2012 0955   ALKPHOS 47 07/01/2021 2221   ALKPHOS 66 09/04/2012 0955   BILITOT 0.4 07/01/2021 2221   BILITOT 0.34 09/04/2012 0955   GFRNONAA >60 07/04/2021 0407   COAGS Lab Results  Component Value Date   INR 0.9 07/01/2021   Lipid Panel    Component Value Date/Time   CHOL 136 07/02/2021 0134   TRIG 65 07/02/2021 0134   HDL 44 07/02/2021 0134   CHOLHDL 3.1 07/02/2021 0134   VLDL 13 07/02/2021 0134   LDLCALC 79 07/02/2021 0134   HgbA1C  Lab Results  Component Value Date   HGBA1C 5.5 07/02/2021   Urinalysis    Component Value Date/Time   BILIRUBINUR neg 09/21/2010 1209   PROTEINUR neg 09/21/2010 1209   UROBILINOGEN neg 09/21/2010 1209   NITRITE neg 09/21/2010 1209   LEUKOCYTESUR neg 09/21/2010 1209   Urine Drug Screen No results found  for: LABOPIA, COCAINSCRNUR, LABBENZ, AMPHETMU, THCU, LABBARB  Alcohol Level    Component Value Date/Time   [facility] <10 07/01/2021 2221     SIGNIFICANT DIAGNOSTIC STUDIES CT HEAD WO CONTRAST (5MM)  Result Date: 07/02/2021 CLINICAL DATA:  Stroke follow-up. EXAM: CT HEAD WITHOUT CONTRAST TECHNIQUE: Contiguous axial images were obtained from the base of the skull through the vertex without intravenous contrast. RADIATION DOSE REDUCTION: This exam was performed according to the departmental dose-optimization program which includes automated exposure control, adjustment of the mA and/or kV according to patient size and/or use of iterative reconstruction technique. COMPARISON:  CT dated 07/01/2021. MRI dated 07/02/2021 FINDINGS: Brain: Faint area of hypodensity involving the posterior right basal ganglia (20/4) may correspond to the infarcts seen on the MRI. The ventricles and sulci are appropriate size for patient's age. The gray-white matter discrimination is otherwise preserved. There is no acute intracranial hemorrhage. No mass effect or midline shift. No extra-axial fluid collection. Vascular: No hyperdense vessel or unexpected calcification. Skull: Normal. Negative for fracture or focal lesion. Sinuses/Orbits: No acute finding. Other: None IMPRESSION: 1. No acute intracranial hemorrhage. 2. Faint area of hypodensity involving the posterior right basal ganglia may correspond to the infarcts seen on the MRI. Electronically Signed   By: Anner Crete M.D.   On: 07/02/2021 21:20   MR ANGIO HEAD WO CONTRAST  Result Date: 07/02/2021 CLINICAL DATA:  Stroke follow-up EXAM: MRI HEAD WITHOUT CONTRAST MRA HEAD WITHOUT CONTRAST MRA OF THE NECK WITHOUT AND WITH CONTRAST TECHNIQUE: Multiplanar, multi-echo pulse sequences of the brain and surrounding structures were acquired without intravenous contrast. Angiographic images of the Circle of Willis were acquired using MRA technique without intravenous contrast.  Angiographic images of the neck were acquired using MRA technique without and with intravenous contrast. Carotid stenosis measurements (when applicable) are obtained utilizing NASCET criteria, using the distal internal carotid diameter as the denominator. CONTRAST:  1mL GADAVIST GADOBUTROL 1 MMOL/ML IV SOLN COMPARISON:  Head CT from yesterday FINDINGS: MR HEAD FINDINGS Brain: Band of restricted diffusion at the posterior right putamen, corona radiata, and posterior caudate nucleus. No pre-existing ischemic injury. No hemorrhage, hydrocephalus, or collection Vascular: Arterial findings below Skull and upper cervical spine: Normal marrow signal. Facet spurring asymmetric to the left at C4-5. Sinuses/Orbits: Negative MRA HEAD FINDINGS Anterior circulation: Aplastic left A1 segment with fetal type left PCA. No branch occlusion, beading, or aneurysm. Negative for significant stenosis. Posterior circulation: Left dominant. The vertebral and basilar arteries  are smoothly contoured and widely patent. No PCA branch occlusion. Minor right P3/4 segment stenosis. MRA NECK FINDINGS Antegrade flow in the carotid and vertebral arteries by time-of-flight. Unremarkable neck mask. Normal appearance of the arch and great vessels. Proximal subclavian, vertebral, and carotid arteries are smoothly contoured and widely patent on both sides. 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 Intracranial MRA: No emergent finding. No branch occlusion or stenosis to correlate with the infarct. Neck MRA: Negative. Electronically Signed   By: Jorje Guild M.D.   On: 07/02/2021 07:13   MR ANGIO NECK W WO CONTRAST  Result Date: 07/02/2021 CLINICAL DATA:  Stroke follow-up EXAM: MRI HEAD WITHOUT CONTRAST MRA HEAD WITHOUT CONTRAST MRA OF THE NECK WITHOUT AND WITH CONTRAST TECHNIQUE: Multiplanar, multi-echo pulse sequences of the brain and surrounding structures were acquired without intravenous  contrast. Angiographic images of the Circle of Willis were acquired using MRA technique without intravenous contrast. Angiographic images of the neck were acquired using MRA technique without and with intravenous contrast. Carotid stenosis measurements (when applicable) are obtained utilizing NASCET criteria, using the distal internal carotid diameter as the denominator. CONTRAST:  71mL GADAVIST GADOBUTROL 1 MMOL/ML IV SOLN COMPARISON:  Head CT from yesterday FINDINGS: MR HEAD FINDINGS Brain: Band of restricted diffusion at the posterior right putamen, corona radiata, and posterior caudate nucleus. No pre-existing ischemic injury. No hemorrhage, hydrocephalus, or collection Vascular: Arterial findings below Skull and upper cervical spine: Normal marrow signal. Facet spurring asymmetric to the left at C4-5. Sinuses/Orbits: Negative MRA HEAD FINDINGS Anterior circulation: Aplastic left A1 segment with fetal type left PCA. No branch occlusion, beading, or aneurysm. Negative for significant stenosis. Posterior circulation: Left dominant. The vertebral and basilar arteries are smoothly contoured and widely patent. No PCA branch occlusion. Minor right P3/4 segment stenosis. MRA NECK FINDINGS Antegrade flow in the carotid and vertebral arteries by time-of-flight. Unremarkable neck mask. Normal appearance of the arch and great vessels. Proximal subclavian, vertebral, and carotid arteries are smoothly contoured and widely patent on both sides. 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 Intracranial MRA: No emergent finding. No branch occlusion or stenosis to correlate with the infarct. Neck MRA: Negative. Electronically Signed   By: Jorje Guild M.D.   On: 07/02/2021 07:13   MR BRAIN WO CONTRAST  Result Date: 07/02/2021 CLINICAL DATA:  Stroke follow-up EXAM: MRI HEAD WITHOUT CONTRAST MRA HEAD WITHOUT CONTRAST MRA OF THE NECK WITHOUT AND WITH CONTRAST TECHNIQUE:  Multiplanar, multi-echo pulse sequences of the brain and surrounding structures were acquired without intravenous contrast. Angiographic images of the Circle of Willis were acquired using MRA technique without intravenous contrast. Angiographic images of the neck were acquired using MRA technique without and with intravenous contrast. Carotid stenosis measurements (when applicable) are obtained utilizing NASCET criteria, using the distal internal carotid diameter as the denominator. CONTRAST:  30mL GADAVIST GADOBUTROL 1 MMOL/ML IV SOLN COMPARISON:  Head CT from yesterday FINDINGS: MR HEAD FINDINGS Brain: Band of restricted diffusion at the posterior right putamen, corona radiata, and posterior caudate nucleus. No pre-existing ischemic injury. No hemorrhage, hydrocephalus, or collection Vascular: Arterial findings below Skull and upper cervical spine: Normal marrow signal. Facet spurring asymmetric to the left at C4-5. Sinuses/Orbits: Negative MRA HEAD FINDINGS Anterior circulation: Aplastic left A1 segment with fetal type left PCA. No branch occlusion, beading, or aneurysm. Negative for significant stenosis. Posterior circulation: Left dominant. The vertebral and basilar arteries are smoothly contoured and  widely patent. No PCA branch occlusion. Minor right P3/4 segment stenosis. MRA NECK FINDINGS Antegrade flow in the carotid and vertebral arteries by time-of-flight. Unremarkable neck mask. Normal appearance of the arch and great vessels. Proximal subclavian, vertebral, and carotid arteries are smoothly contoured and widely patent on both sides. 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 Intracranial MRA: No emergent finding. No branch occlusion or stenosis to correlate with the infarct. Neck MRA: Negative. Electronically Signed   By: Jorje Guild M.D.   On: 07/02/2021 07:13   ECHOCARDIOGRAM COMPLETE  Result Date: 07/02/2021    ECHOCARDIOGRAM REPORT    Patient Name:   REICE BIENVENUE Date of Exam: 07/02/2021 Medical Rec #:  [id number]       Height:       70.0 in Accession #:    [id number]      Weight:       205.9 lb Date of Birth:  1950-11-01       BSA:          2.113 m Patient Age:    71 years        BP:           158/78 mmHg Patient Gender: M               HR:           52 bpm. Exam Location:  Inpatient Procedure: 2D Echo, 3D Echo, Cardiac Doppler, Color Doppler and Strain Analysis Indications:     CVA  History:         Patient has prior history of Echocardiogram examinations, most                  recent 03/11/2006. Risk Factors:Hypertension. 03/11/2006 stress                  echo.  Sonographer:     Luisa Hart RDCS Referring Phys:  [id number] ASHISH ARORA Diagnosing Phys: Rozann Lesches MD IMPRESSIONS  1. Left ventricular ejection fraction, by estimation, is 60 to 65%. The left ventricle has normal function. The left ventricle has no regional wall motion abnormalities. There is mild eccentric left ventricular hypertrophy of the basal segment. Left ventricular diastolic parameters were normal. Normal global longitudinal strain of -18.4%.  2. The right ventricle is mildly enlarged with prominent apical trabeculation. Right ventricular function is normal. Estimated RVSP normal at 23 mmHg.  3. The mitral valve is grossly normal. Mild mitral valve regurgitation.  4. The aortic valve is tricuspid. Aortic valve regurgitation is not visualized. Aortic valve sclerosis is present, with no evidence of aortic valve stenosis. Aortic valve mean gradient measures 5.0 mmHg.  5. The inferior vena cava is normal in size with greater than 50% respiratory variability, suggesting right atrial pressure of 3 mmHg. Comparison(s): Prior images unable to be directly viewed. FINDINGS  Left Ventricle: Left ventricular ejection fraction, by estimation, is 60 to 65%. The left ventricle has normal function. The left ventricle has no regional wall motion abnormalities. The left ventricular  internal cavity size was normal in size. There is  mild eccentric left ventricular hypertrophy of the basal segment. Left ventricular diastolic parameters were normal. Right Ventricle: The right ventricular size is mildly enlarged. No increase in right ventricular wall thickness. Right ventricular systolic function is normal. There is normal pulmonary artery systolic pressure. The tricuspid regurgitant velocity is 2.21  m/s, and with an assumed right atrial pressure of 3 mmHg, the estimated  right ventricular systolic pressure is 89.2 mmHg. Left Atrium: Left atrial size was normal in size. Right Atrium: Right atrial size was normal in size. Pericardium: There is no evidence of pericardial effusion. Mitral Valve: The mitral valve is grossly normal. Mild mitral valve regurgitation. Tricuspid Valve: The tricuspid valve is grossly normal. Tricuspid valve regurgitation is trivial. Aortic Valve: The aortic valve is tricuspid. Aortic valve regurgitation is not visualized. Aortic valve sclerosis is present, with no evidence of aortic valve stenosis. Aortic valve mean gradient measures 5.0 mmHg. Aortic valve peak gradient measures 9.9  mmHg. Aortic valve area, by VTI measures 2.84 cm. Pulmonic Valve: The pulmonic valve was grossly normal. Pulmonic valve regurgitation is trivial. Aorta: The aortic root is normal in size and structure. Venous: The inferior vena cava is normal in size with greater than 50% respiratory variability, suggesting right atrial pressure of 3 mmHg. IAS/Shunts: No atrial level shunt detected by color flow Doppler.  LEFT VENTRICLE PLAX 2D LVIDd:         5.00 cm     Diastology LVIDs:         2.40 cm     LV e' medial:    7.62 cm/s LV PW:         0.70 cm     LV E/e' medial:  8.6 LV IVS:        1.10 cm     LV e' lateral:   10.40 cm/s LVOT diam:     2.10 cm     LV E/e' lateral: 6.3 LV SV:         101 LV SV Index:   48 LVOT Area:     3.46 cm  LV Volumes (MOD) LV vol d, MOD A2C: 58.5 ml LV vol d, MOD A4C: 81.1  ml LV vol s, MOD A2C: 25.4 ml LV vol s, MOD A4C: 25.0 ml LV SV MOD A2C:     33.1 ml LV SV MOD A4C:     81.1 ml LV SV MOD BP:      44.2 ml RIGHT VENTRICLE RV Basal diam:  5.10 cm RV Mid diam:    4.30 cm RV S prime:     13.30 cm/s TAPSE (M-mode): 3.2 cm LEFT ATRIUM             Index        RIGHT ATRIUM           Index LA diam:        2.70 cm 1.28 cm/m   RA Area:     20.30 cm LA Vol (A2C):   53.3 ml 25.22 ml/m  RA Volume:   61.80 ml  29.24 ml/m LA Vol (A4C):   40.9 ml 19.35 ml/m LA Biplane Vol: 47.3 ml 22.38 ml/m  AORTIC VALVE                     PULMONIC VALVE AV Area (Vmax):    2.74 cm      PV Vmax:          1.16 m/s AV Area (Vmean):   2.74 cm      PV Vmean:         76.600 cm/s AV Area (VTI):     2.84 cm      PV VTI:           0.245 m AV Vmax:           157.00 cm/s   PV Peak grad:  5.3 mmHg AV Vmean:          105.000 cm/s  PV Mean grad:     2.5 mmHg AV VTI:            0.356 m       PR End Diast Vel: 3.36 msec AV Peak Grad:      9.9 mmHg AV Mean Grad:      5.0 mmHg LVOT Vmax:         124.00 cm/s LVOT Vmean:        83.200 cm/s LVOT VTI:          0.292 m LVOT/AV VTI ratio: 0.82  AORTA Ao Root diam: 3.10 cm Ao Asc diam:  3.50 cm MITRAL VALVE               TRICUSPID VALVE MV Area (PHT): 2.99 cm    TR Peak grad:   19.5 mmHg MV Decel Time: 254 msec    TR Vmax:        221.00 cm/s MV E velocity: 65.50 cm/s MV A velocity: 68.40 cm/s  SHUNTS MV E/A ratio:  0.96        Systemic VTI:  0.29 m                            Systemic Diam: 2.10 cm Rozann Lesches MD Electronically signed by Rozann Lesches MD Signature Date/Time: 07/02/2021/10:54:31 AM    Final (Updated)    CT HEAD CODE STROKE WO CONTRAST  Result Date: 07/01/2021 CLINICAL DATA:  Code stroke.  Left-sided weakness and numbness. EXAM: CT HEAD WITHOUT CONTRAST TECHNIQUE: Contiguous axial images were obtained from the base of the skull through the vertex without intravenous contrast. RADIATION DOSE REDUCTION: This exam was performed according to the  departmental dose-optimization program which includes automated exposure control, adjustment of the mA and/or kV according to patient size and/or use of iterative reconstruction technique. COMPARISON:  None. FINDINGS: Brain: No evidence of acute infarction, hemorrhage, cerebral edema, mass, mass effect, or midline shift. Ventricles and sulci are normal for age. No extra-axial fluid collection. Vascular: No hyperdense vessel or unexpected calcification. Skull: Normal. Negative for fracture or focal lesion. Sinuses/Orbits: No acute finding. Other: The mastoid air cells are well aerated. ASPECTS [facility] Stroke Program Early CT Score) - Ganglionic level infarction (caudate, lentiform nuclei, internal capsule, insula, M1-M3 cortex): 7 - Supraganglionic infarction (M4-M6 cortex): 3 Total score (0-10 with 10 being normal): 10 IMPRESSION: 1. No acute intracranial process. 2. ASPECTS is 10 Code stroke imaging results were communicated on 07/01/2021 at 10:27 pm to provider Dr. Rory Percy via secure text paging. Electronically Signed   By: Merilyn Baba M.D.   On: 07/01/2021 22:27       HISTORY OF PRESENT ILLNESS Patient with a history of HTN, recent bilateral carpal tunnel surgery, BPH and seasonal allergies was admitted with left sided weakness and left sided facial droop.  HOSPITAL COURSE Patient was evaluated in the ED and given TNK 9 minutes after arrival.  He was then transferred to the ICU for observation. No HT was seen on repeat imaging. Blood pressure was within parameters, and patient is now stable and ready to be discharged to rehabilitation.  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 Small vessel disease. Atrophy. ASPECTS 10.    MRI  Acute perforator infarct at the right  basal ganglia and corona radiata MRA  No LVO. aplastic left A1 segment with fetal type left PCA.  No branch occlusion, negative  for significant stenosis.  Minor right P3 P4 segment stenosis.  2D Echo EF is 60-65% left left ventricle has normal function.  Mild mitral valve regurgitation right and left atria normal in size LDL 79 HgbA1c 5.5 VTE prophylaxis - SCDs aspirin 81 mg daily prior to admission, now on ASA and plavix  Hypertension Home meds:  olmesartan Stable Long-term BP goal normotensive   Hyperlipidemia Home meds:  rosuvastatin 10mg  LDL 79, goal < 70 Increase rosuvastatin to 20mg   Continue statin at discharge   Other Stroke Risk Factors Advanced Age >/= 45    Other Active Problems Recent bilateral carpal tunnel surgeries No pain, numbness, or tingling in right hand, wrist, or arm. Left sided symptoms can be attributed to his stroke  DISCHARGE EXAM Blood pressure (!) 149/85, pulse 63, temperature 98.2 F (36.8 C), temperature source Oral, resp. rate 16, height 5\' 10"  (1.778 m), weight 93.4 kg, SpO2 97 %. General:  Alert, well-nourished, well-developed patient in no acute distress Respiratory: Regular, unlabored respirations on room air  NEURO:  Mental Status: AA&Ox3  Speech/Language: speech is without dysarthria or aphasia.  Naming, fluency, and comprehension intact.  Cranial Nerves:  II: PERRL. Visual fields full.  III, IV, VI: EOMI. Eyelids elevate symmetrically.  V: Sensation is intact to light touch and symmetrical to face.  VII: Smile is symmetrical.  VIII: hearing intact to voice. IX, X: Phonation is normal.  XII: tongue is midline without fasciculations. Motor: 5/5 strength to RUE and RLE, 4/5 to LUE and LLE Sensation- Intact to light touch bilaterally.    Coordination: FTN intact bilaterally Gait- deferred   Discharge Diet      Diet   Diet heart healthy/carb modified Room service appropriate? Yes; Fluid consistency: Thin   liquids  DISCHARGE PLAN Disposition:  Transfer to [facility] for ongoing PT, OT and ST aspirin 81 mg daily and clopidogrel 75 mg  daily for secondary stroke prevention for 3 weeks then aspirin alone. Recommend ongoing stroke risk factor control by Primary Care Physician at time of discharge from inpatient rehabilitation. Follow-up PCP Josetta Huddle, MD in 2 weeks following discharge from rehab. Follow-up in [facility] in 4 weeks following discharge from rehab, office to schedule an appointment.   25 minutes were spent preparing discharge.  [place] , MSN, AGACNP-BC [facility] See Amion for schedule and pager information 07/04/2021 12:51 PM  ATTENDING ATTESTATION:  Dr. Reeves Forth evaluated pt independently, reviewed imaging, chart, labs. Discussed and formulated plan with the APP. Please see APP note above for details.   Total 36 minutes spent on counseling patient and coordinating care, writing notes and reviewing chart.   Taia Bramlett,MD

## 2021-07-04 NOTE — PMR Pre-admission (Signed)
PMR Admission Coordinator Pre-Admission Assessment  Patient: Eddie Calhoun is an 72 y.o., male MRN: [id number] DOB: [birth year] Height: $RemoveBeforeDE'5\' 10"'[id number]$  (177.8 cm) Weight: 93.4 kg  Insurance Information HMO:     PPO:      PCP:      IPA:      80/20:      OTHER:  PRIMARY: Medicare a and b      Policy#: [id number]      Subscriber: pt Benefits:  Phone #: passport one source     Name: 2/28 Eff. Date: a 09/04/2016 and b 05/07/2017     Deduct: $1600      Out of Pocket Max: none      Life Max: none CIR: 100%      SNF: 20 full days Outpatient: 80%     Co-Pay: 20% Home Health: 100%      Co-Pay: none DME: 80%     Co-Pay: 20% Providers: pt choice  SECONDARY: AARP supplement      Policy#: [id number]      Financial Counselor:       Phone#:   The Data Collection Information Summary for patients in Inpatient Rehabilitation Facilities with attached Privacy Act [place] Records was provided and verbally reviewed with: Patient and Family  Emergency Contact Information Contact Information     Name Relation Home Work Mobile   Harper Spouse   [phone]      Current Medical History  Patient Admitting Diagnosis: CVA  History of Present Illness:  71 year old right-handed male with history of hypertension, recent bilateral carpal tunnel surgeries, BPH, quit smoking 33 years ago thrombocytopenia around 120,000.   Presented 07/01/2021 with acute onset of left-sided weakness and left facial droop.  Cranial CT scan negative for acute process and patient did receive IV TNKase.  MRA head and neck no emergent finding.  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  Echocardiogram with ejection fraction of 60 to 65% no wall motion abnormalities.  Admission chemistries unremarkable except creatinine 1.26, glucose 108, platelets 120,000, hemoglobin 12.7.  Neurology follow-up placed on aspirin 81 mg daily and Plavix 75 mg daily for CVA prophylaxis.   Permissive hypertension and patient on Benicar 40 mg daily prior to admission.  Tolerating a regular consistency diet.    Complete NIHSS TOTAL: 4  Patient's medical record from [facility] has been reviewed by the rehabilitation admission coordinator and physician.  Past Medical History  Past Medical History:  Diagnosis Date   Allergy    allergic rhinitis   BPH (benign prostatic hyperplasia)    Elevated prostate specific antigen (PSA)    Hypertension    Plantar fasciitis of right foot    Pneumonia    Thrombocytopenia, unspecified ([place]) 07/26/2012   03/24/12  129,000!   Has the patient had major surgery during 100 days prior to admission? Yes  Family History   family history includes Cancer (age of onset: 70) in his father; Hypertension in his father and mother.  Current Medications  Current Facility-Administered Medications:    acetaminophen (TYLENOL) tablet 650 mg, 650 mg, Oral, Q4H PRN **OR** acetaminophen (TYLENOL) 160 MG/5ML solution 650 mg, 650 mg, Per Tube, Q4H PRN **OR** acetaminophen (TYLENOL) suppository 650 mg, 650 mg, Rectal, Q4H PRN, Amie Portland, MD   aspirin EC tablet 81 mg, 81 mg, Oral, Daily, Amie Portland, MD, 81 mg at 07/04/21 1024   Chlorhexidine Gluconate Cloth 2 % PADS 6 each, 6 each, Topical, Daily,  Amie Portland, MD, 6 each at 07/04/21 1025   labetalol (NORMODYNE) injection 20 mg, 20 mg, Intravenous, Once **AND** clevidipine (CLEVIPREX) infusion 0.5 mg/mL, 0-21 mg/hr, Intravenous, Continuous, Amie Portland, MD   clopidogrel (PLAVIX) tablet 75 mg, 75 mg, Oral, Daily, de La Torre, Neche E, NP, 75 mg at 07/04/21 1024   irbesartan (AVAPRO) tablet 300 mg, 300 mg, Oral, Daily, Shafer, Devon, NP, 300 mg at 07/04/21 1024   pantoprazole (PROTONIX) EC tablet 40 mg, 40 mg, Oral, QHS, Palikh, Gaurang M, MD   rosuvastatin (CRESTOR) tablet 20 mg, 20 mg, Oral, Daily, Shafer, Devon, NP, 20 mg at 07/04/21 1024   senna-docusate (Senokot-S) tablet 1 tablet, 1 tablet,  Oral, QHS PRN, Amie Portland, MD   tamsulosin [facility]) capsule 0.4 mg, 0.4 mg, Oral, QPC supper, Charlean Merl, [place], NP, 0.4 mg at 07/03/21 1808  Patients Current Diet:  Diet Order             Diet heart healthy/carb modified Room service appropriate? Yes; Fluid consistency: Thin  Diet effective now                  Precautions / Restrictions Precautions Precautions: Fall Restrictions Weight Bearing Restrictions: No   Has the patient had 2 or more falls or a fall with injury in the past year? No  Prior Activity Level Community (5-7x/wk): Independent; retired, active, driving  Prior Functional Level Self Care: Did the patient need help bathing, dressing, using the toilet or eating? Independent  Indoor Mobility: Did the patient need assistance with walking from room to room (with or without device)? Independent  Stairs: Did the patient need assistance with internal or external stairs (with or without device)? Independent  Functional Cognition: Did the patient need help planning regular tasks such as shopping or remembering to take medications? Independent  Patient Information Are you of Hispanic, Latino/a,or Spanish origin?: A. No, not of Hispanic, Latino/a, or Spanish origin What is your race?: A. White Do you need or want an interpreter to communicate with a doctor or health care staff?: 0. No  Patient's Response To:  Health Literacy and Transportation Is the patient able to respond to health literacy and transportation needs?: Yes Health Literacy - How often do you need to have someone help you when you read instructions, pamphlets, or other written material from your doctor or pharmacy?: Never In the past 12 months, has lack of transportation kept you from medical appointments or from getting medications?: No In the past 12 months, has lack of transportation kept you from meetings, work, or from getting things needed for daily living?: No  Development worker, international aid /  [place] Devices/Equipment: None Home Equipment: None  Prior Device Use: Indicate devices/aids used by the patient prior to current illness, exacerbation or injury? None of the above  Current Functional Level Cognition  Arousal/Alertness: Awake/alert Overall Cognitive Status: Within Functional Limits for tasks assessed Orientation Level: Oriented X4 Following Commands: Follows multi-step commands inconsistently, Follows multi-step commands with increased time General Comments: Pt was motivated and looking forward to physical therapy today. Attention: Focused, Sustained Focused Attention: Appears intact Sustained Attention: Appears intact Memory: Impaired Memory Impairment: Storage deficit (Immediate: 5/5; delayed: 2/5; with cues: 3/3) Awareness: Appears intact Problem Solving: Appears intact Executive Function: Reasoning, Sequencing, Organizing Reasoning: Appears intact Sequencing: Appears intact (clock drawing: 4/4) Organizing: Appears intact (backward digit span: 2/2)    Extremity Assessment (includes Sensation/Coordination)  Upper Extremity Assessment: Defer to OT evaluation LUE Deficits / Details: Able to perform  digit ROM (decreased opposition to fifth digit), wrist ROM, supination/prontation, and elbow ROM with increased time and effort. Limited shoulder ROM to 30* forward flexion and 50* abduction LUE Coordination: decreased gross motor, decreased fine motor  Lower Extremity Assessment: LLE deficits/detail LLE Deficits / Details: PF/DF 2-/5, knee extension 4-/5, hip flexion 3/5 LLE Sensation: decreased light touch    ADLs  Overall ADL's : Needs assistance/impaired Eating/Feeding: Set up, Sitting Grooming: Minimal assistance, Sitting Upper Body Bathing: Moderate assistance, Sitting Lower Body Bathing: Maximal assistance, Sit to/from stand Upper Body Dressing : Moderate assistance, Sitting Lower Body Dressing: Maximal assistance, Sit to/from stand Toilet  Transfer: Moderate assistance, Rolling walker (2 wheels), Ambulation Toilet Transfer Details (indicate cue type and reason): Mod A for maintaining balance with L left and sequencing LLE; blocking throughout for L knee Functional mobility during ADLs: Moderate assistance, Rolling walker (2 wheels) General ADL Comments: Pt presenting with left sided weakness, decreased coordiantion, and left lateral leaning    Mobility  Overal bed mobility: Needs Assistance Bed Mobility: Supine to Sit, Sit to Supine Supine to sit: Min guard, HOB elevated Sit to supine: Min assist General bed mobility comments: pt required min assist for sit to supine to manage his legs.    Transfers  Overall transfer level: Needs assistance Equipment used: Rolling walker (2 wheels) Transfers: Sit to/from Stand, Bed to chair/wheelchair/BSC Sit to Stand: Min assist Bed to/from chair/wheelchair/BSC transfer type:: Step pivot Step pivot transfers: Min assist General transfer comment: Pt require min assistance standing up from the recliner as well as cuing for posture.    Ambulation / Gait / Stairs / Wheelchair Mobility  Ambulation/Gait Ambulation/Gait assistance: Mod assist Gait Distance (Feet): 80 Feet Assistive device: Rolling walker (2 wheels) Gait Pattern/deviations: Step-to pattern, Knee hyperextension - left, Decreased dorsiflexion - left, Decreased stride length General Gait Details: Pt required mod assist with gait to help proximal stabilization of the hips in order to keep his pelvis over his foot. Ace wrap was used to maintain dorsiflexion during gait. Gait velocity: reduced Gait velocity interpretation: <1.8 ft/sec, indicate of risk for recurrent falls    Posture / Balance Balance Overall balance assessment: Modified Independent Sitting-balance support: No upper extremity supported, Feet supported Sitting balance-Leahy Scale: Good Standing balance support: Bilateral upper extremity supported, Reliant on  assistive device for balance Standing balance-Leahy Scale: Poor Standing balance comment: Static balance with bil support required min guard. High Level Balance Comments: Pt was able to weight shift side to side and lift contralateral leg up with Bil UE support on walker and and min assist for balance.    Special needs/care consideration    Previous Home Environment  Living Arrangements: Spouse/significant other  Lives With: Spouse Available Help at Discharge: Family, Available 24 hours/day Type of Home: House Home Layout: Multi-level, Able to live on main level with bedroom/bathroom Home Access: Stairs to enter Entrance Stairs-Number of Steps: 3 Bathroom Shower/Tub: Multimedia programmer: Standard Bathroom Accessibility: Yes How Accessible: Accessible via walker [place]: No  Discharge Living Setting Plans for Discharge Living Setting: Patient's home, Lives with (comment) (wife) Type of Home at Discharge: House Discharge Home Layout: Multi-level, Able to live on main level with bedroom/bathroom Discharge Home Access: Stairs to enter Entrance Stairs-Rails: None Entrance Stairs-Number of Steps: 3 Discharge Bathroom Shower/Tub: Walk-in shower Discharge Bathroom Toilet: Standard Discharge Bathroom Accessibility: Yes How Accessible: Accessible via walker Does the patient have any problems obtaining your medications?: No  Social/Family/Support Systems Patient Roles: Spouse Contact Information: wife,  Nunzio Cory Anticipated Caregiver: wife Anticipated Caregiver's Contact Information: see contacts Ability/Limitations of Caregiver: no limitations Caregiver Availability: 24/7 Discharge Plan Discussed with Primary Caregiver: Yes Is Caregiver In Agreement with Plan?: Yes Does Caregiver/Family have Issues with Lodging/Transportation while Pt is in Rehab?: No  Goals Patient/Family Goal for Rehab: Mod I to supervision with PT and OT Expected length of stay: ELOS 10 to  12 days Pt/Family Agrees to Admission and willing to participate: Yes Program Orientation Provided & Reviewed with Pt/Caregiver Including Roles  & Responsibilities: Yes  Decrease burden of Care through IP rehab admission: n/a  Possible need for SNF placement upon discharge: not anticipated  Patient Condition: I have reviewed medical records from [facility], spoken with CM, and patient and spouse. I met with patient at the bedside for inpatient rehabilitation assessment.  Patient will benefit from ongoing PT and OT, can actively participate in 3 hours of therapy a day 5 days of the week, and can make measurable gains during the admission.  Patient will also benefit from the coordinated team approach during an Inpatient Acute Rehabilitation admission.  The patient will receive intensive therapy as well as Rehabilitation physician, nursing, social worker, and care management interventions.  Due to bladder management, bowel management, safety, skin/wound care, disease management, medication administration, pain management, and patient education the patient requires 24 hour a day rehabilitation nursing.  The patient is currently mod assist overall with mobility and basic ADLs.  Discharge setting and therapy post discharge at home with home health is anticipated.  Patient has agreed to participate in the Acute Inpatient Rehabilitation Program and will admit today.  Preadmission Screen Completed By:  Cleatrice Burke, 07/04/2021 11:40 AM ______________________________________________________________________   Discussed status with Dr. Dagoberto Ligas on 07/04/2021 at 1142 and received approval for admission today.  Admission Coordinator:  Cleatrice Burke, RN, time  0102 Date  07/04/2021   Assessment/Plan: Diagnosis: Does the need for close, 24 hr/day Medical supervision in concert with the patient's rehab needs make it unreasonable for this patient to be served in a less intensive setting?  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 Yes Does the patient require coordinated care of a physician, rehab nurse, PT, OT, and SLP to address physical and functional deficits in the context of the above medical diagnosis(es)? Yes Addressing deficits in the following areas: balance, endurance, locomotion, strength, transferring, bowel/bladder control, bathing, dressing, feeding, grooming, and toileting Can the patient actively participate in an intensive therapy program of at least 3 hrs of therapy 5 days a week? Yes The potential for patient to make measurable gains while on inpatient rehab is good Anticipated functional outcomes upon discharge from inpatient rehab: modified independent and supervision PT, modified independent and supervision OT, n/a SLP Estimated rehab length of stay to reach the above functional goals is: 10-12 days Anticipated discharge destination: Home 10. Overall Rehab/Functional Prognosis: good   MD Signature:

## 2021-07-04 NOTE — Discharge Instructions (Addendum)
Eddie Calhoun, you were admitted with left-sided weakness and facial droop.  You were evaluated in the ED and given TNK to treat the stroke causing your symptoms.  You experienced some improvement in your symptoms and are now ready to be discharged to inpatient rehabilitation.

## 2021-07-04 NOTE — Progress Notes (Signed)
Inpatient Rehabilitation Admission Medication Review by a Pharmacist  A complete drug regimen review was completed for this patient to identify any potential clinically significant medication issues.  High Risk Drug Classes Is patient taking? Indication by Medication  Antipsychotic No   Anticoagulant No   Antibiotic No   Opioid No   Antiplatelet Yes Aspirin, plavix- CVA prophylaxis  Hypoglycemics/insulin No   Vasoactive Medication Yes Flomax- BPH  Chemotherapy No   Other Yes Crestor- HLD Protonix- GERD     Type of Medication Issue Identified Description of Issue Recommendation(s)  Drug Interaction(s) (clinically significant)     Duplicate Therapy     Allergy     No Medication Administration End Date  Plavix As per neurology recommendations, three weeks of DAPT followed by aspirin alone. End date for Plavix: July 23, 2021  Incorrect Dose     Additional Drug Therapy Needed     Significant med changes from prior encounter (inform family/care partners about these prior to discharge).    Other  PTA meds: Vitamin D-3 Benicar Flomax Vitamin B-12 Restart PTA meds when and if clinically necessary during CIR admission or at time of discharge    Clinically significant medication issues were identified that warrant physician communication and completion of prescribed/recommended actions by midnight of the next day:  No  Time spent performing this drug regimen review (minutes):  30   Chalon Zobrist BS, PharmD, BCPS Clinical Pharmacist 07/04/2021 2:43 PM

## 2021-07-04 NOTE — Progress Notes (Signed)
Occupational Therapy Treatment Patient Details Name: Eddie Calhoun MRN: [id number] DOB: Jan 18, 1951 Today's Date: 07/04/2021   History of present illness 71 yo male presenting to ED on 2/25 with self sided weakness. TNK given 2/25. 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 PMH including BPH, HTN, and thrombocytopenia.   OT comments  Deiontae is progressing well and remains incredibly motivated to participate, he has plans to d/c to CIR today. Noted improvements in LUE movement and strength, pt able to use as a functional assist and for UE support during standing tasks. Pt ambulated to the bathroom and completed several transfers from various heights with min A over all, cues for positioning and safety. Pt continues to benefit from OT acutely. D/c to CIR today.   Recommendations for follow up therapy are one component of a multi-disciplinary discharge planning process, led by the attending physician.  Recommendations may be updated based on patient status, additional functional criteria and insurance authorization.    Follow Up Recommendations  Acute inpatient rehab (3hours/day)    Assistance Recommended at Discharge Frequent or constant Supervision/Assistance  Patient can return home with the following  A lot of help with walking and/or transfers;A lot of help with bathing/dressing/bathroom   Equipment Recommendations  Other (comment) (defer to CIR)       Precautions / Restrictions Precautions Precautions: Fall Restrictions Weight Bearing Restrictions: No       Mobility Bed Mobility Overal bed mobility: Needs Assistance Bed Mobility: Rolling, Sidelying to Sit Rolling: Min assist Sidelying to sit: Mod assist            Transfers Overall transfer level: Needs assistance Equipment used: Rolling walker (2 wheels) Transfers: Sit to/from Stand Sit to Stand: Min assist           General transfer comment: several transfers this  session including from low toilet     Balance Overall balance assessment: Needs assistance Sitting-balance support: No upper extremity supported, Feet supported Sitting balance-Leahy Scale: Fair Sitting balance - Comments: mild L LOB with fatigue   Standing balance support: Single extremity supported, During functional activity Standing balance-Leahy Scale: Fair Standing balance comment: can staticaly stand with 1 UE supported at the sink                           ADL either performed or assessed with clinical judgement   ADL Overall ADL's : Needs assistance/impaired   Eating/Feeding Details (indicate cue type and reason): encouarged use of LUE for self feeding Grooming: Wash/dry hands;Oral care;Minimal assistance;Standing Grooming Details (indicate cue type and reason): min A fro standing balance and cues. LUE used fro external support on sink and functional assist wtih tooth paste.             Lower Body Dressing: Moderate assistance;Sit to/from stand   Toilet Transfer: Minimal assistance;Rolling walker (2 wheels)   Toileting- Clothing Manipulation and Hygiene: Min guard;Sitting/lateral lean       Functional mobility during ADLs: Minimal assistance;Cueing for safety;Cueing for sequencing General ADL Comments: completed toileting, LB dressing, and grooming. Use compnesatory techniques for safety and L side involvement. Educated on the importance of repetition and functional movement for motor learning    Extremity/Trunk Assessment Upper Extremity Assessment Upper Extremity Assessment: LUE deficits/detail LUE Deficits / Details: Able to perform digit ROM (decreased opposition to fifth digit), wrist ROM, supination/prontation, and elbow ROM with increased time and effort. Limited shoulder ROM to 80* forward  flexion and 50* abduction. Able to hold shoulder ROM against gravity for ~10 seconds.Using L as a functional assist rather than dominate. Cues for unsafe  positioning/poor proprioception LUE Sensation: decreased light touch;decreased proprioception LUE Coordination: decreased gross motor;decreased fine motor   Lower Extremity Assessment Lower Extremity Assessment: Defer to PT evaluation           Perception Perception Perception: Not tested   Praxis Praxis Praxis: Not tested    Cognition Arousal/Alertness: Awake/alert Behavior During Therapy: WFL for tasks assessed/performed Overall Cognitive Status: Within Functional Limits for tasks assessed         General Comments: motivated to participate. follows all commands. good recall and carry over of techniques from prior therapy sessions              General Comments VSS onRA, wife present and supportive    Pertinent Vitals/ Pain       Pain Assessment Pain Assessment: No/denies pain   Frequency  Min 2X/week        Progress Toward Goals  OT Goals(current goals can now be found in the care plan section)  Progress towards OT goals: Progressing toward goals  Acute Rehab OT Goals Patient Stated Goal: get better OT Goal Formulation: With patient Time For Goal Achievement: 07/16/21 Potential to Achieve Goals: Good ADL Goals Pt Will Perform Grooming: with set-up;with supervision;sitting Pt Will Perform Upper Body Dressing: with set-up;with supervision;sitting Pt Will Perform Lower Body Dressing: with mod assist;sit to/from stand Pt Will Transfer to Toilet: with min assist;ambulating;bedside commode Pt Will Perform Toileting - Clothing Manipulation and hygiene: with min assist;sit to/from stand;sitting/lateral leans Pt/caregiver will Perform Home Exercise Program: Increased ROM;Left upper extremity;Independently;With written HEP provided  Plan Discharge plan remains appropriate       AM-PAC OT "6 Clicks" Daily Activity     Outcome Measure   Help from another person eating meals?: A Little Help from another person taking care of personal grooming?: A Little Help  from another person toileting, which includes using toliet, bedpan, or urinal?: A Little Help from another person bathing (including washing, rinsing, drying)?: A Little Help from another person to put on and taking off regular upper body clothing?: A Little Help from another person to put on and taking off regular lower body clothing?: A Little 6 Click Score: 18    End of Session Equipment Utilized During Treatment: Gait belt;Rolling walker (2 wheels)  OT Visit Diagnosis: Unsteadiness on feet (R26.81);Other abnormalities of gait and mobility (R26.89);Muscle weakness (generalized) (M62.81)   Activity Tolerance Patient tolerated treatment well   Patient Left in chair;with call bell/phone within reach;with chair alarm set;with family/visitor present   Nurse Communication Mobility status        Time: 3474-2595 OT Time Calculation (min): 39 min  Charges: OT General Charges $OT Visit: 1 Visit OT Treatments $Self Care/Home Management : 38-52 mins   Archer Vise A Anishka Bushard 07/04/2021, 1:15 PM

## 2021-07-04 NOTE — H&P (Signed)
Physical Medicine and Rehabilitation Admission H&P        Chief Complaint  Patient presents with   Code Stroke  : HPI: Eddie Calhoun is a 71 year old left handed male with history of hypertension, recent bilateral carpal tunnel surgeries, BPH, quit smoking 33 years ago thrombocytopenia around 120,000.  Per chart review lives with spouse.  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  Presented 07/01/2021 with acute onset of left-sided weakness and left facial droop.  Cranial CT scan negative for acute process and patient did receive IV TNKase.  MRA head and neck no emergent finding.  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  Echocardiogram with ejection fraction of 60 to 65% no wall motion abnormalities.  Admission chemistries unremarkable except creatinine 1.26, glucose 108, platelets 120,000, hemoglobin 12.7.  Neurology follow-up placed on aspirin 81 mg daily and Plavix 75 mg daily for CVA prophylaxis.  Permissive hypertension and patient on Benicar 40 mg daily prior to admission.  Tolerating a regular consistency diet.  Therapy evaluations completed due to patient's left-sided weakness was admitted for a comprehensive rehab program.   Pt reports LBM today; voiding OK- no pain, but has some tenderness around CTS surgical sites on wrists R>L.     Review of Systems  Constitutional:  Negative for chills and fever.  HENT:  Negative for hearing loss.   Eyes:  Negative for blurred vision and double vision.  Respiratory:  Negative for cough and shortness of breath.   Cardiovascular:  Negative for chest pain, palpitations and leg swelling.  Gastrointestinal:  Positive for constipation. Negative for heartburn, nausea and vomiting.  Genitourinary:  Positive for urgency. Negative for dysuria, flank pain and hematuria.  Musculoskeletal:  Positive for myalgias.  Skin:  Negative for rash.  Neurological:  Positive for weakness.   All other systems reviewed and are negative.     Past Medical History:  Diagnosis Date   Allergy      allergic rhinitis   BPH (benign prostatic hyperplasia)     Elevated prostate specific antigen (PSA)     Hypertension     Pneumonia     Thrombocytopenia, unspecified ([place]) 07/26/2012    03/24/12  129,000!    No past surgical history on file.      Family History  Problem Relation Age of Onset   Hypertension Mother     Hypertension Father     Cancer Father 57        father died with hx of prostate cancer    Social History:  reports that he quit smoking about 33 years ago. His smoking use included cigarettes. He has a 40.00 pack-year smoking history. He has never used smokeless tobacco. He reports that he does not drink alcohol. No history on file for drug use. Allergies:      Allergies  Allergen Reactions   Contrast Media [Iodinated Contrast Media] Hives          Medications Prior to Admission  Medication Sig Dispense Refill   cholecalciferol (VITAMIN D3) 25 MCG (1000 UNIT) tablet Take 1,000 Units by mouth daily.       Multiple Vitamin (MULTIVITAMIN) tablet Take 1 tablet by mouth daily.         olmesartan (BENICAR) 40 MG tablet Take 1 tablet (40 mg total) by mouth daily. 90 tablet 3   rosuvastatin (CRESTOR) 10 MG tablet Take 10 mg by mouth daily.  tamsulosin (FLOMAX) 0.4 MG CAPS capsule Take 0.4 mg by mouth daily after supper.       vitamin B-12 (CYANOCOBALAMIN) 1000 MCG tablet Take 1,000 mcg by mouth daily.              Home: Home Living Family/patient expects to be discharged to:: Private residence Living Arrangements: Spouse/significant other Available Help at Discharge: Family, Available 24 hours/day Type of Home: House Home Access: Stairs to enter [employer] of Steps: 3 Home Layout: Multi-level, Able to live on main level with bedroom/bathroom Bathroom Shower/Tub: Multimedia programmer: Standard Home Equipment: None   Functional  History: Prior Function Prior Level of Function : Independent/Modified Independent, Driving ADLs Comments: Retired Water engineer. Loves golf and bowling. Very active.   Functional Status:  Mobility: Bed Mobility Overal bed mobility: Needs Assistance Bed Mobility: Supine to Sit, Sit to Supine Supine to sit: Min guard, HOB elevated Sit to supine: Min assist General bed mobility comments: pt required min assist for sit to supine to manage his legs. Transfers Overall transfer level: Needs assistance Equipment used: Rolling walker (2 wheels) Transfers: Sit to/from Stand, Bed to chair/wheelchair/BSC Sit to Stand: Min assist Bed to/from chair/wheelchair/BSC transfer type:: Step pivot Step pivot transfers: Min assist General transfer comment: Pt require min assistance standing up from the recliner as well as cuing for posture. Ambulation/Gait Ambulation/Gait assistance: Mod assist Gait Distance (Feet): 80 Feet Assistive device: Rolling walker (2 wheels) Gait Pattern/deviations: Step-to pattern, Knee hyperextension - left, Decreased dorsiflexion - left, Decreased stride length General Gait Details: Pt required mod assist with gait to help proximal stabilization of the hips in order to keep his pelvis over his foot. Ace wrap was used to maintain dorsiflexion during gait. Gait velocity: reduced Gait velocity interpretation: <1.8 ft/sec, indicate of risk for recurrent falls   ADL: ADL Overall ADL's : Needs assistance/impaired Eating/Feeding: Set up, Sitting Grooming: Minimal assistance, Sitting Upper Body Bathing: Moderate assistance, Sitting Lower Body Bathing: Maximal assistance, Sit to/from stand Upper Body Dressing : Moderate assistance, Sitting Lower Body Dressing: Maximal assistance, Sit to/from stand Toilet Transfer: Moderate assistance, Rolling walker (2 wheels), Ambulation Toilet Transfer Details (indicate cue type and reason): Mod A for maintaining balance with L left and  sequencing LLE; blocking throughout for L knee Functional mobility during ADLs: Moderate assistance, Rolling walker (2 wheels) General ADL Comments: Pt presenting with left sided weakness, decreased coordiantion, and left lateral leaning   Cognition: Cognition Overall Cognitive Status: Within Functional Limits for tasks assessed Arousal/Alertness: Awake/alert Orientation Level: Oriented X4 Year: 2023 Month: February Day of Week: Correct Attention: Focused, Sustained Focused Attention: Appears intact Sustained Attention: Appears intact Memory: Impaired Memory Impairment: Storage deficit (Immediate: 5/5; delayed: 2/5; with cues: 3/3) Awareness: Appears intact Problem Solving: Appears intact Executive Function: Reasoning, Sequencing, Organizing Reasoning: Appears intact Sequencing: Appears intact (clock drawing: 4/4) Organizing: Appears intact (backward digit span: 2/2) Cognition Arousal/Alertness: Awake/alert Behavior During Therapy: WFL for tasks assessed/performed Overall Cognitive Status: Within Functional Limits for tasks assessed Area of Impairment: Problem solving, Following commands Following Commands: Follows multi-step commands inconsistently, Follows multi-step commands with increased time Problem Solving: Slow processing, Requires verbal cues, Difficulty sequencing General Comments: Pt was motivated and looking forward to physical therapy today.   Physical Exam: Blood pressure 104/77, pulse (!) 55, temperature 97.8 F (36.6 C), temperature source Oral, resp. rate 16, height 5\' 10"  (1.778 m), weight 93.4 kg, SpO2 96 %. Physical Exam Vitals and nursing note reviewed. Exam conducted with a chaperone present.  Constitutional:      Appearance: Normal appearance.     Comments: Pt is sitting up in bedside chair; wife at bedside; appropriate, NAD  HENT:     Head: Normocephalic and atraumatic.     Comments: Tongue deviated to left Smile equal    Right Ear: External ear  normal.     Left Ear: External ear normal.     Nose: Nose normal. No congestion.     Mouth/Throat:     Mouth: Mucous membranes are dry.     Pharynx: Oropharynx is clear. No oropharyngeal exudate.  Eyes:     General:        Right eye: No discharge.        Left eye: No discharge.     Extraocular Movements: Extraocular movements intact.     Comments: No nystagmus  Cardiovascular:     Rate and Rhythm: Normal rate and regular rhythm.     Heart sounds: Normal heart sounds. No murmur heard.   No gallop.  Pulmonary:     Effort: Pulmonary effort is normal. No respiratory distress.     Breath sounds: Normal breath sounds. No wheezing, rhonchi or rales.  Abdominal:     General: Abdomen is flat. Bowel sounds are normal. There is no distension.     Palpations: Abdomen is soft.     Tenderness: There is no abdominal tenderness.  Musculoskeletal:     Cervical back: Neck supple. No tenderness.     Comments: RUE/RLE 5/5 LUE 4-/5 in biceps, triceps, WE, grip and FA LLE- HF 3-/5; KE/KF 4-/5; DF 0/5 and PF 2/5  Skin:    Comments: L AC fossa IV- looks OK L CTS surgery incision on L wrist healed- done 12/22 R CTS incision scabbed over done 1/23  Neurological:     Mental Status: He is oriented to person, place, and time.     Sensory: No sensory deficit.     Comments: Patient is alert.  Makes eye contact with examiner.  Oriented x3 and follows commands.  Fair awareness of deficits. Intact to light touch in all 4 extremities  Psychiatric:        Mood and Affect: Mood normal.        Behavior: Behavior normal.      Lab Results Last 48 Hours        Results for orders placed or performed during the hospital encounter of 07/01/21 (from the past 48 hour(s))  Basic metabolic panel     Status: Abnormal    Collection Time: 07/03/21  5:00 AM  Result Value Ref Range    Sodium 140 135 - 145 mmol/L    Potassium 3.9 3.5 - 5.1 mmol/L    Chloride 111 98 - 111 mmol/L    CO2 23 22 - 32 mmol/L    Glucose, Bld  104 (H) 70 - 99 mg/dL      Comment: Glucose reference range applies only to samples taken after fasting for at least 8 hours.    BUN 19 8 - 23 mg/dL    Creatinine, Ser 1.14 0.61 - 1.24 mg/dL    Calcium 8.7 (L) 8.9 - 10.3 mg/dL    GFR, Estimated >60 >60 mL/min      Comment: (NOTE) Calculated using the CKD-EPI Creatinine Equation (2021)      Anion gap 6 5 - 15      Comment: Performed at [facility] [address] [place], [place] [address]  CBC  Status: Abnormal    Collection Time: 07/03/21  5:00 AM  Result Value Ref Range    WBC 4.4 4.0 - 10.5 K/uL    RBC 3.66 (L) 4.22 - 5.81 MIL/uL    Hemoglobin 12.3 (L) 13.0 - 17.0 g/dL    HCT 35.1 (L) 39.0 - 52.0 %    MCV 95.9 80.0 - 100.0 fL    MCH 33.6 26.0 - 34.0 pg    MCHC 35.0 30.0 - 36.0 g/dL    RDW 12.5 11.5 - 15.5 %    Platelets 101 (L) 150 - 400 K/uL      Comment: Immature Platelet Fraction may be clinically indicated, consider ordering this additional test [id number] REPEATED TO VERIFY PLATELET COUNT CONFIRMED BY SMEAR      nRBC 0.0 0.0 - 0.2 %      Comment: Performed at [facility], [place] [address] [place], [place] [address]  Basic metabolic panel     Status: Abnormal    Collection Time: 07/04/21  4:07 AM  Result Value Ref Range    Sodium 140 135 - 145 mmol/L    Potassium 3.7 3.5 - 5.1 mmol/L    Chloride 107 98 - 111 mmol/L    CO2 23 22 - 32 mmol/L    Glucose, Bld 103 (H) 70 - 99 mg/dL      Comment: Glucose reference range applies only to samples taken after fasting for at least 8 hours.    BUN 17 8 - 23 mg/dL    Creatinine, Ser 1.15 0.61 - 1.24 mg/dL    Calcium 8.8 (L) 8.9 - 10.3 mg/dL    GFR, Estimated >60 >60 mL/min      Comment: (NOTE) Calculated using the CKD-EPI Creatinine Equation (2021)      Anion gap 10 5 - 15      Comment: Performed at [facility] [address] [place], [name] [address]  CBC     Status: Abnormal    Collection Time: 07/04/21  4:07 AM  Result Value Ref Range    WBC  4.7 4.0 - 10.5 K/uL    RBC 3.69 (L) 4.22 - 5.81 MIL/uL    Hemoglobin 12.3 (L) 13.0 - 17.0 g/dL    HCT 35.3 (L) 39.0 - 52.0 %    MCV 95.7 80.0 - 100.0 fL    MCH 33.3 26.0 - 34.0 pg    MCHC 34.8 30.0 - 36.0 g/dL    RDW 12.5 11.5 - 15.5 %    Platelets 102 (L) 150 - 400 K/uL      Comment: Immature Platelet Fraction may be clinically indicated, consider ordering this additional test [id number] CONSISTENT WITH PREVIOUS RESULT REPEATED TO VERIFY      nRBC 0.0 0.0 - 0.2 %      Comment: Performed at [facility], [place] [address], [place], [place] [address]       Imaging Results (Last 48 hours)  CT HEAD WO CONTRAST (5MM)   Result Date: 07/02/2021 CLINICAL DATA:  Stroke follow-up. EXAM: CT HEAD WITHOUT CONTRAST TECHNIQUE: Contiguous axial images were obtained from the base of the skull through the vertex without intravenous contrast. RADIATION DOSE REDUCTION: This exam was performed according to the departmental dose-optimization program which includes automated exposure control, adjustment of the mA and/or kV according to patient size and/or use of iterative reconstruction technique. COMPARISON:  CT dated 07/01/2021. MRI dated 07/02/2021 FINDINGS: Brain: Faint area of hypodensity involving the posterior right basal ganglia (20/4)  may correspond to the infarcts seen on the MRI. The ventricles and sulci are appropriate size for patient's age. The gray-white matter discrimination is otherwise preserved. There is no acute intracranial hemorrhage. No mass effect or midline shift. No extra-axial fluid collection. Vascular: No hyperdense vessel or unexpected calcification. Skull: Normal. Negative for fracture or focal lesion. Sinuses/Orbits: No acute finding. Other: None IMPRESSION: 1. No acute intracranial hemorrhage. 2. Faint area of hypodensity involving the posterior right basal ganglia may correspond to the infarcts seen on the MRI. Electronically Signed   By: Anner Crete M.D.   On: 07/02/2021  21:20    MR ANGIO HEAD WO CONTRAST   Result Date: 07/02/2021 CLINICAL DATA:  Stroke follow-up EXAM: MRI HEAD WITHOUT CONTRAST MRA HEAD WITHOUT CONTRAST MRA OF THE NECK WITHOUT AND WITH CONTRAST TECHNIQUE: Multiplanar, multi-echo pulse sequences of the brain and surrounding structures were acquired without intravenous contrast. Angiographic images of the Circle of Willis were acquired using MRA technique without intravenous contrast. Angiographic images of the neck were acquired using MRA technique without and with intravenous contrast. Carotid stenosis measurements (when applicable) are obtained utilizing NASCET criteria, using the distal internal carotid diameter as the denominator. CONTRAST:  67mL GADAVIST GADOBUTROL 1 MMOL/ML IV SOLN COMPARISON:  Head CT from yesterday FINDINGS: MR HEAD FINDINGS Brain: Band of restricted diffusion at the posterior right putamen, corona radiata, and posterior caudate nucleus. No pre-existing ischemic injury. No hemorrhage, hydrocephalus, or collection Vascular: Arterial findings below Skull and upper cervical spine: Normal marrow signal. Facet spurring asymmetric to the left at C4-5. Sinuses/Orbits: Negative MRA HEAD FINDINGS Anterior circulation: Aplastic left A1 segment with fetal type left PCA. No branch occlusion, beading, or aneurysm. Negative for significant stenosis. Posterior circulation: Left dominant. The vertebral and basilar arteries are smoothly contoured and widely patent. No PCA branch occlusion. Minor right P3/4 segment stenosis. MRA NECK FINDINGS Antegrade flow in the carotid and vertebral arteries by time-of-flight. Unremarkable neck mask. Normal appearance of the arch and great vessels. Proximal subclavian, vertebral, and carotid arteries are smoothly contoured and widely patent on both sides. 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 Intracranial MRA: No emergent finding. No branch occlusion or stenosis to  correlate with the infarct. Neck MRA: Negative. Electronically Signed   By: Jorje Guild M.D.   On: 07/02/2021 07:13    MR ANGIO NECK W WO CONTRAST   Result Date: 07/02/2021 CLINICAL DATA:  Stroke follow-up EXAM: MRI HEAD WITHOUT CONTRAST MRA HEAD WITHOUT CONTRAST MRA OF THE NECK WITHOUT AND WITH CONTRAST TECHNIQUE: Multiplanar, multi-echo pulse sequences of the brain and surrounding structures were acquired without intravenous contrast. Angiographic images of the Circle of Willis were acquired using MRA technique without intravenous contrast. Angiographic images of the neck were acquired using MRA technique without and with intravenous contrast. Carotid stenosis measurements (when applicable) are obtained utilizing NASCET criteria, using the distal internal carotid diameter as the denominator. CONTRAST:  65mL GADAVIST GADOBUTROL 1 MMOL/ML IV SOLN COMPARISON:  Head CT from yesterday FINDINGS: MR HEAD FINDINGS Brain: Band of restricted diffusion at the posterior right putamen, corona radiata, and posterior caudate nucleus. No pre-existing ischemic injury. No hemorrhage, hydrocephalus, or collection Vascular: Arterial findings below Skull and upper cervical spine: Normal marrow signal. Facet spurring asymmetric to the left at C4-5. Sinuses/Orbits: Negative MRA HEAD FINDINGS Anterior circulation: Aplastic left A1 segment with fetal type left PCA. No branch occlusion, beading, or aneurysm. Negative for significant stenosis. Posterior circulation: Left dominant. The  vertebral and basilar arteries are smoothly contoured and widely patent. No PCA branch occlusion. Minor right P3/4 segment stenosis. MRA NECK FINDINGS Antegrade flow in the carotid and vertebral arteries by time-of-flight. Unremarkable neck mask. Normal appearance of the arch and great vessels. Proximal subclavian, vertebral, and carotid arteries are smoothly contoured and widely patent on both sides. IMPRESSION: Brain MRI: Acute perforator infarct at  the right basal ganglia and corona radiata. Intracranial MRA: No emergent finding. No branch occlusion or stenosis to correlate with the infarct. Neck MRA: Negative. Electronically Signed   By: Jorje Guild M.D.   On: 07/02/2021 07:13    MR BRAIN WO CONTRAST   Result Date: 07/02/2021 CLINICAL DATA:  Stroke follow-up EXAM: MRI HEAD WITHOUT CONTRAST MRA HEAD WITHOUT CONTRAST MRA OF THE NECK WITHOUT AND WITH CONTRAST TECHNIQUE: Multiplanar, multi-echo pulse sequences of the brain and surrounding structures were acquired without intravenous contrast. Angiographic images of the Circle of Willis were acquired using MRA technique without intravenous contrast. Angiographic images of the neck were acquired using MRA technique without and with intravenous contrast. Carotid stenosis measurements (when applicable) are obtained utilizing NASCET criteria, using the distal internal carotid diameter as the denominator. CONTRAST:  39mL GADAVIST GADOBUTROL 1 MMOL/ML IV SOLN COMPARISON:  Head CT from yesterday FINDINGS: MR HEAD FINDINGS Brain: Band of restricted diffusion at the posterior right putamen, corona radiata, and posterior caudate nucleus. No pre-existing ischemic injury. No hemorrhage, hydrocephalus, or collection Vascular: Arterial findings below Skull and upper cervical spine: Normal marrow signal. Facet spurring asymmetric to the left at C4-5. Sinuses/Orbits: Negative MRA HEAD FINDINGS Anterior circulation: Aplastic left A1 segment with fetal type left PCA. No branch occlusion, beading, or aneurysm. Negative for significant stenosis. Posterior circulation: Left dominant. The vertebral and basilar arteries are smoothly contoured and widely patent. No PCA branch occlusion. Minor right P3/4 segment stenosis. MRA NECK FINDINGS Antegrade flow in the carotid and vertebral arteries by time-of-flight. Unremarkable neck mask. Normal appearance of the arch and great vessels. Proximal subclavian, vertebral, and carotid  arteries are smoothly contoured and widely patent on both sides. 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 Intracranial MRA: No emergent finding. No branch occlusion or stenosis to correlate with the infarct. Neck MRA: Negative. Electronically Signed   By: Jorje Guild M.D.   On: 07/02/2021 07:13    ECHOCARDIOGRAM COMPLETE   Result Date: 07/02/2021    ECHOCARDIOGRAM REPORT   Patient Name:   FABIEN TRAVELSTEAD Date of Exam: 07/02/2021 Medical Rec #:  [id number]       Height:       70.0 in Accession #:    [id number]      Weight:       205.9 lb Date of Birth:  1951/01/07       BSA:          2.113 m Patient Age:    43 years        BP:           158/78 mmHg Patient Gender: M               HR:           52 bpm. Exam Location:  Inpatient Procedure: 2D Echo, 3D Echo, Cardiac Doppler, Color Doppler and Strain Analysis Indications:     CVA  History:         Patient has prior history of Echocardiogram examinations, most  recent 03/11/2006. Risk Factors:Hypertension. 03/11/2006 stress                  echo.  Sonographer:     Luisa Hart RDCS Referring Phys:  [id number] ASHISH ARORA Diagnosing Phys: Rozann Lesches MD IMPRESSIONS  1. Left ventricular ejection fraction, by estimation, is 60 to 65%. The left ventricle has normal function. The left ventricle has no regional wall motion abnormalities. There is mild eccentric left ventricular hypertrophy of the basal segment. Left ventricular diastolic parameters were normal. Normal global longitudinal strain of -18.4%.  2. The right ventricle is mildly enlarged with prominent apical trabeculation. Right ventricular function is normal. Estimated RVSP normal at 23 mmHg.  3. The mitral valve is grossly normal. Mild mitral valve regurgitation.  4. The aortic valve is tricuspid. Aortic valve regurgitation is not visualized. Aortic valve sclerosis is present, with no evidence of aortic valve stenosis. Aortic valve mean  gradient measures 5.0 mmHg.  5. The inferior vena cava is normal in size with greater than 50% respiratory variability, suggesting right atrial pressure of 3 mmHg. Comparison(s): Prior images unable to be directly viewed. FINDINGS  Left Ventricle: Left ventricular ejection fraction, by estimation, is 60 to 65%. The left ventricle has normal function. The left ventricle has no regional wall motion abnormalities. The left ventricular internal cavity size was normal in size. There is  mild eccentric left ventricular hypertrophy of the basal segment. Left ventricular diastolic parameters were normal. Right Ventricle: The right ventricular size is mildly enlarged. No increase in right ventricular wall thickness. Right ventricular systolic function is normal. There is normal pulmonary artery systolic pressure. The tricuspid regurgitant velocity is 2.21  m/s, and with an assumed right atrial pressure of 3 mmHg, the estimated right ventricular systolic pressure is 02.6 mmHg. Left Atrium: Left atrial size was normal in size. Right Atrium: Right atrial size was normal in size. Pericardium: There is no evidence of pericardial effusion. Mitral Valve: The mitral valve is grossly normal. Mild mitral valve regurgitation. Tricuspid Valve: The tricuspid valve is grossly normal. Tricuspid valve regurgitation is trivial. Aortic Valve: The aortic valve is tricuspid. Aortic valve regurgitation is not visualized. Aortic valve sclerosis is present, with no evidence of aortic valve stenosis. Aortic valve mean gradient measures 5.0 mmHg. Aortic valve peak gradient measures 9.9  mmHg. Aortic valve area, by VTI measures 2.84 cm. Pulmonic Valve: The pulmonic valve was grossly normal. Pulmonic valve regurgitation is trivial. Aorta: The aortic root is normal in size and structure. Venous: The inferior vena cava is normal in size with greater than 50% respiratory variability, suggesting right atrial pressure of 3 mmHg. IAS/Shunts: No atrial  level shunt detected by color flow Doppler.  LEFT VENTRICLE PLAX 2D LVIDd:         5.00 cm     Diastology LVIDs:         2.40 cm     LV e' medial:    7.62 cm/s LV PW:         0.70 cm     LV E/e' medial:  8.6 LV IVS:        1.10 cm     LV e' lateral:   10.40 cm/s LVOT diam:     2.10 cm     LV E/e' lateral: 6.3 LV SV:         101 LV SV Index:   48 LVOT Area:     3.46 cm  LV Volumes (MOD) LV vol d, MOD A2C: 58.5  ml LV vol d, MOD A4C: 81.1 ml LV vol s, MOD A2C: 25.4 ml LV vol s, MOD A4C: 25.0 ml LV SV MOD A2C:     33.1 ml LV SV MOD A4C:     81.1 ml LV SV MOD BP:      44.2 ml RIGHT VENTRICLE RV Basal diam:  5.10 cm RV Mid diam:    4.30 cm RV S prime:     13.30 cm/s TAPSE (M-mode): 3.2 cm LEFT ATRIUM             Index        RIGHT ATRIUM           Index LA diam:        2.70 cm 1.28 cm/m   RA Area:     20.30 cm LA Vol (A2C):   53.3 ml 25.22 ml/m  RA Volume:   61.80 ml  29.24 ml/m LA Vol (A4C):   40.9 ml 19.35 ml/m LA Biplane Vol: 47.3 ml 22.38 ml/m  AORTIC VALVE                     PULMONIC VALVE AV Area (Vmax):    2.74 cm      PV Vmax:          1.16 m/s AV Area (Vmean):   2.74 cm      PV Vmean:         76.600 cm/s AV Area (VTI):     2.84 cm      PV VTI:           0.245 m AV Vmax:           157.00 cm/s   PV Peak grad:     5.3 mmHg AV Vmean:          105.000 cm/s  PV Mean grad:     2.5 mmHg AV VTI:            0.356 m       PR End Diast Vel: 3.36 msec AV Peak Grad:      9.9 mmHg AV Mean Grad:      5.0 mmHg LVOT Vmax:         124.00 cm/s LVOT Vmean:        83.200 cm/s LVOT VTI:          0.292 m LVOT/AV VTI ratio: 0.82  AORTA Ao Root diam: 3.10 cm Ao Asc diam:  3.50 cm MITRAL VALVE               TRICUSPID VALVE MV Area (PHT): 2.99 cm    TR Peak grad:   19.5 mmHg MV Decel Time: 254 msec    TR Vmax:        221.00 cm/s MV E velocity: 65.50 cm/s MV A velocity: 68.40 cm/s  SHUNTS MV E/A ratio:  0.96        Systemic VTI:  0.29 m                            Systemic Diam: 2.10 cm Rozann Lesches MD Electronically signed by  Rozann Lesches MD Signature Date/Time: 07/02/2021/10:54:31 AM    Final (Updated)            Blood pressure 104/77, pulse (!) 55, temperature 97.8 F (36.6 C), temperature source Oral, resp. rate 16, height 5\' 10"  (1.778 m), weight 93.4 kg, SpO2 96 %.   Medical Problem  List and Plan: 1. 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  Status post IV TNKase             -patient may  shower             -ELOS/Goals: 10-12 days mod I to supervision 2.  Antithrombotics: -DVT/anticoagulation:  Mechanical:  Antiembolism stockings, knee (TED hose) Bilateral lower extremities             -antiplatelet therapy: Aspirin 81 mg daily and Plavix 75 mg daily 3. Pain Management: Tylenol as needed 4. Mood: Provide emotional support             -antipsychotic agents: N/A 5. Neuropsych: This patient is capable of making decisions on his own behalf. 6. Skin/Wound Care: Routine skin checks 7. Fluids/Electrolytes/Nutrition: Routine in and outs with follow-up chemistries 8.  Hypertension.  Avapro resumed at 300 mg daily.  Monitor with increased mobility 9.  BPH.  Flomax 0.4 mg daily.  Check PVR 10.  History of chronic thrombocytopenia.  Platelets 120,000-154000.  Follow-up CBC 11.  Hyperlipidemia.  Crestor 12.  Recent bilateral carpal tunnel surgeries.  Follow-up outpatient- can use gloves to protect wrists/hands on walker/assistive device     I have personally performed a face to face diagnostic evaluation of this patient and formulated the key components of the plan.  Additionally, I have personally reviewed laboratory data, imaging studies, as well as relevant notes and concur with the physician assistant's documentation above.   The patient's status has not changed from the original H&P.  Any changes in documentation from the acute care chart have been noted above.           Lavon Paganini Angiulli, PA-C 07/04/2021

## 2021-07-04 NOTE — Progress Notes (Signed)
Courtney Heys, MD  Physician Physical Medicine and Rehabilitation PMR Pre-admission    Signed Date of Service:  07/04/2021 11:09 AM  Related encounter: ED to Hosp-Admission (Discharged) from 07/01/2021 in [facility]   Signed      Show:Clear all [x] Written[x] Templated[x] Copied  Added by: [x] Cristina Gong, RN[x] Courtney Heys, MD  [] Hover for details                                                                                                                                                                                                                                                                                                                                                                                                                                  PMR Admission Coordinator Pre-Admission Assessment   Patient: Eddie Calhoun is an 71 y.o., male MRN: [id number] DOB: 01/27/1951 Height: 5\' 10"  (177.8 cm) Weight: 93.4 kg   Insurance Information HMO:     PPO:      PCP:      IPA:      80/20:      OTHER:  PRIMARY: Medicare a and b      Policy#: [id number]      Subscriber: pt Benefits:  Phone #: passport one source     Name: 2/28 Eff. Date: a 09/04/2016 and b 05/07/2017     Deduct: $1600      Out of Pocket Max: none  Life Max: none CIR: 100%      SNF: 20 full days Outpatient: 80%     Co-Pay: 20% Home Health: 100%      Co-Pay: none DME: 80%     Co-Pay: 20% Providers: pt choice  SECONDARY: AARP supplement      Policy#: [id number]       Financial Counselor:       Phone#:    The Data Collection Information Summary for patients in Inpatient Rehabilitation Facilities with attached Privacy Act [place] Records was provided and verbally reviewed with: Patient and Family   Emergency Contact  Information Contact Information       Name Relation Home Work Mobile    Carmen Spouse     [phone]         Current Medical History  Patient Admitting Diagnosis: CVA   History of Present Illness:  71 year old right-handed male with history of hypertension, recent bilateral carpal tunnel surgeries, BPH, quit smoking 33 years ago thrombocytopenia around 120,000.   Presented 07/01/2021 with acute onset of left-sided weakness and left facial droop.  Cranial CT scan negative for acute process and patient did receive IV TNKase.  MRA head and neck no emergent finding.  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  Echocardiogram with ejection fraction of 60 to 65% no wall motion abnormalities.  Admission chemistries unremarkable except creatinine 1.26, glucose 108, platelets 120,000, hemoglobin 12.7.  Neurology follow-up placed on aspirin 81 mg daily and Plavix 75 mg daily for CVA prophylaxis.  Permissive hypertension and patient on Benicar 40 mg daily prior to admission.  Tolerating a regular consistency diet.     Complete NIHSS TOTAL: 4   Patient[facility] has been reviewed by the rehabilitation admission coordinator and physician.   Past Medical History      Past Medical History:  Diagnosis Date   Allergy      allergic rhinitis   BPH (benign prostatic hyperplasia)     Elevated prostate specific antigen (PSA)     Hypertension     Plantar fasciitis of right foot     Pneumonia     Thrombocytopenia, unspecified ([place]) 07/26/2012    03/24/12  129,000!    Has the patient had major surgery during 100 days prior to admission? Yes   Family History   family history includes Cancer (age of onset: 78) in his father; Hypertension in his father and mother.   Current Medications   Current Facility-Administered Medications:    acetaminophen (TYLENOL) tablet 650 mg, 650 mg, Oral, Q4H PRN **OR**  acetaminophen (TYLENOL) 160 MG/5ML solution 650 mg, 650 mg, Per Tube, Q4H PRN **OR** acetaminophen (TYLENOL) suppository 650 mg, 650 mg, Rectal, Q4H PRN, Amie Portland, MD   aspirin EC tablet 81 mg, 81 mg, Oral, Daily, Amie Portland, MD, 81 mg at 07/04/21 1024   Chlorhexidine Gluconate Cloth 2 % PADS 6 each, 6 each, Topical, Daily, Amie Portland, MD, 6 each at 07/04/21 1025   labetalol (NORMODYNE) injection 20 mg, 20 mg, Intravenous, Once **AND** clevidipine (CLEVIPREX) infusion 0.5 mg/mL, 0-21 mg/hr, Intravenous, Continuous, Amie Portland, MD   clopidogrel (PLAVIX) tablet 75 mg, 75 mg, Oral, Daily, de La Torre, Manlius E, NP, 75 mg at 07/04/21 1024   irbesartan (AVAPRO) tablet 300 mg, 300 mg, Oral, Daily, Shafer, Devon, NP, 300 mg at 07/04/21 1024   pantoprazole (PROTONIX) EC tablet 40 mg, 40 mg, Oral, QHS, Palikh, Velna Ochs, MD  rosuvastatin (CRESTOR) tablet 20 mg, 20 mg, Oral, Daily, Shafer, Devon, NP, 20 mg at 07/04/21 1024   senna-docusate (Senokot-S) tablet 1 tablet, 1 tablet, Oral, QHS PRN, Amie Portland, MD   tamsulosin [facility]) capsule 0.4 mg, 0.4 mg, Oral, QPC supper, Charlean Merl, [place], NP, 0.4 mg at 07/03/21 1808   Patients Current Diet:  Diet Order                  Diet heart healthy/carb modified Room service appropriate? Yes; Fluid consistency: Thin  Diet effective now                       Precautions / Restrictions Precautions Precautions: Fall Restrictions Weight Bearing Restrictions: No    Has the patient had 2 or more falls or a fall with injury in the past year? No   Prior Activity Level Community (5-7x/wk): Independent; retired, active, driving   Prior Functional Level Self Care: Did the patient need help bathing, dressing, using the toilet or eating? Independent   Indoor Mobility: Did the patient need assistance with walking from room to room (with or without device)? Independent   Stairs: Did the patient need assistance with internal or external stairs (with  or without device)? Independent   Functional Cognition: Did the patient need help planning regular tasks such as shopping or remembering to take medications? Independent   Patient Information Are you of Hispanic, Latino/a,or Spanish origin?: A. No, not of Hispanic, Latino/a, or Spanish origin What is your race?: A. White Do you need or want an interpreter to communicate with a doctor or health care staff?: 0. No   Patient's Response To:  Health Literacy and Transportation Is the patient able to respond to health literacy and transportation needs?: Yes Health Literacy - How often do you need to have someone help you when you read instructions, pamphlets, or other written material from your doctor or pharmacy?: Never In the past 12 months, has lack of transportation kept you from medical appointments or from getting medications?: No In the past 12 months, has lack of transportation kept you from meetings, work, or from getting things needed for daily living?: No   Development worker, international aid / [place] Devices/Equipment: None Home Equipment: None   Prior Device Use: Indicate devices/aids used by the patient prior to current illness, exacerbation or injury? None of the above   Current Functional Level Cognition   Arousal/Alertness: Awake/alert Overall Cognitive Status: Within Functional Limits for tasks assessed Orientation Level: Oriented X4 Following Commands: Follows multi-step commands inconsistently, Follows multi-step commands with increased time General Comments: Pt was motivated and looking forward to physical therapy today. Attention: Focused, Sustained Focused Attention: Appears intact Sustained Attention: Appears intact Memory: Impaired Memory Impairment: Storage deficit (Immediate: 5/5; delayed: 2/5; with cues: 3/3) Awareness: Appears intact Problem Solving: Appears intact Executive Function: Reasoning, Sequencing, Organizing Reasoning: Appears  intact Sequencing: Appears intact (clock drawing: 4/4) Organizing: Appears intact (backward digit span: 2/2)    Extremity Assessment (includes Sensation/Coordination)   Upper Extremity Assessment: Defer to OT evaluation LUE Deficits / Details: Able to perform digit ROM (decreased opposition to fifth digit), wrist ROM, supination/prontation, and elbow ROM with increased time and effort. Limited shoulder ROM to 30* forward flexion and 50* abduction LUE Coordination: decreased gross motor, decreased fine motor  Lower Extremity Assessment: LLE deficits/detail LLE Deficits / Details: PF/DF 2-/5, knee extension 4-/5, hip flexion 3/5 LLE Sensation: decreased light touch     ADLs   Overall  ADL's : Needs assistance/impaired Eating/Feeding: Set up, Sitting Grooming: Minimal assistance, Sitting Upper Body Bathing: Moderate assistance, Sitting Lower Body Bathing: Maximal assistance, Sit to/from stand Upper Body Dressing : Moderate assistance, Sitting Lower Body Dressing: Maximal assistance, Sit to/from stand Toilet Transfer: Moderate assistance, Rolling walker (2 wheels), Ambulation Toilet Transfer Details (indicate cue type and reason): Mod A for maintaining balance with L left and sequencing LLE; blocking throughout for L knee Functional mobility during ADLs: Moderate assistance, Rolling walker (2 wheels) General ADL Comments: Pt presenting with left sided weakness, decreased coordiantion, and left lateral leaning     Mobility   Overal bed mobility: Needs Assistance Bed Mobility: Supine to Sit, Sit to Supine Supine to sit: Min guard, HOB elevated Sit to supine: Min assist General bed mobility comments: pt required min assist for sit to supine to manage his legs.     Transfers   Overall transfer level: Needs assistance Equipment used: Rolling walker (2 wheels) Transfers: Sit to/from Stand, Bed to chair/wheelchair/BSC Sit to Stand: Min assist Bed to/from chair/wheelchair/BSC transfer type::  Step pivot Step pivot transfers: Min assist General transfer comment: Pt require min assistance standing up from the recliner as well as cuing for posture.     Ambulation / Gait / Stairs / Wheelchair Mobility   Ambulation/Gait Ambulation/Gait assistance: Mod assist Gait Distance (Feet): 80 Feet Assistive device: Rolling walker (2 wheels) Gait Pattern/deviations: Step-to pattern, Knee hyperextension - left, Decreased dorsiflexion - left, Decreased stride length General Gait Details: Pt required mod assist with gait to help proximal stabilization of the hips in order to keep his pelvis over his foot. Ace wrap was used to maintain dorsiflexion during gait. Gait velocity: reduced Gait velocity interpretation: <1.8 ft/sec, indicate of risk for recurrent falls     Posture / Balance Balance Overall balance assessment: Modified Independent Sitting-balance support: No upper extremity supported, Feet supported Sitting balance-Leahy Scale: Good Standing balance support: Bilateral upper extremity supported, Reliant on assistive device for balance Standing balance-Leahy Scale: Poor Standing balance comment: Static balance with bil support required min guard. High Level Balance Comments: Pt was able to weight shift side to side and lift contralateral leg up with Bil UE support on walker and and min assist for balance.     Special needs/care consideration      Previous Home Environment  Living Arrangements: Spouse/significant other  Lives With: Spouse Available Help at Discharge: Family, Available 24 hours/day Type of Home: House Home Layout: Multi-level, Able to live on main level with bedroom/bathroom Home Access: Stairs to enter Entrance Stairs-Number of Steps: 3 Bathroom Shower/Tub: Multimedia programmer: Standard Bathroom Accessibility: Yes How Accessible: Accessible via walker [place]: No   Discharge Living Setting Plans for Discharge Living Setting: Patient's home,  Lives with (comment) (wife) Type of Home at Discharge: House Discharge Home Layout: Multi-level, Able to live on main level with bedroom/bathroom Discharge Home Access: Stairs to enter Entrance Stairs-Rails: None Entrance Stairs-Number of Steps: 3 Discharge Bathroom Shower/Tub: Walk-in shower Discharge Bathroom Toilet: Standard Discharge Bathroom Accessibility: Yes How Accessible: Accessible via walker Does the patient have any problems obtaining your medications?: No   Social/Family/Support Systems Patient Roles: Spouse Contact Information: wife, Nunzio Cory Anticipated Caregiver: wife Anticipated Ambulance person Information: see contacts Ability/Limitations of Caregiver: no limitations Caregiver Availability: 24/7 Discharge Plan Discussed with Primary Caregiver: Yes Is Caregiver In Agreement with Plan?: Yes Does Caregiver/Family have Issues with Lodging/Transportation while Pt is in Rehab?: No   Goals Patient/Family Goal for Rehab: Mod I  to supervision with PT and OT Expected length of stay: ELOS 10 to 12 days Pt/Family Agrees to Admission and willing to participate: Yes Program Orientation Provided & Reviewed with Pt/Caregiver Including Roles  & Responsibilities: Yes   Decrease burden of Care through IP rehab admission: n/a   Possible need for SNF placement upon discharge: not anticipated   Patient Condition: I have reviewed medical records from [facility], spoken with CM, and patient and spouse. I met with patient at the bedside for inpatient rehabilitation assessment.  Patient will benefit from ongoing PT and OT, can actively participate in 3 hours of therapy a day 5 days of the week, and can make measurable gains during the admission.  Patient will also benefit from the coordinated team approach during an Inpatient Acute Rehabilitation admission.  The patient will receive intensive therapy as well as Rehabilitation physician, nursing, social worker, and care  management interventions.  Due to bladder management, bowel management, safety, skin/wound care, disease management, medication administration, pain management, and patient education the patient requires 24 hour a day rehabilitation nursing.  The patient is currently mod assist overall with mobility and basic ADLs.  Discharge setting and therapy post discharge at home with home health is anticipated.  Patient has agreed to participate in the Acute Inpatient Rehabilitation Program and will admit today.   Preadmission Screen Completed By:  Cleatrice Burke, 07/04/2021 11:40 AM ______________________________________________________________________   Discussed status with Dr. Dagoberto Ligas on 07/04/2021 at 1142 and received approval for admission today.   Admission Coordinator:  Cleatrice Burke, RN, time  0100 Date  07/04/2021    Assessment/Plan: Diagnosis: Does the need for close, 24 hr/day Medical supervision in concert with the patient's rehab needs make it unreasonable for this patient to be served in a less intensive setting? 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 Yes Does the patient require coordinated care of a physician, rehab nurse, PT, OT, and SLP to address physical and functional deficits in the context of the above medical diagnosis(es)? Yes Addressing deficits in the following areas: balance, endurance, locomotion, strength, transferring, bowel/bladder control, bathing, dressing, feeding, grooming, and toileting Can the patient actively participate in an intensive therapy program of at least 3 hrs of therapy 5 days a week? Yes The potential for patient to make  measurable gains while on inpatient rehab is good Anticipated functional outcomes upon discharge from inpatient rehab: modified independent and supervision PT, modified independent and supervision OT, n/a SLP Estimated rehab length of stay to reach the above functional goals is: 10-12 days Anticipated discharge destination: Home 10. Overall Rehab/Functional Prognosis: good     MD Signature:           Revision History                     Note Details  Jan Fireman, MD File Time 07/04/2021 12:02 PM  Author Type Physician Status Signed  Last Editor Courtney Heys, MD Service Physical Medicine and [place] # [id number] Admit Date 07/04/2021

## 2021-07-04 NOTE — Progress Notes (Signed)
Pt arrived via w/c, pt alert and oriented, able to make needs known, oriented to rehab, all needs met with call bell in reach.

## 2021-07-04 NOTE — H&P (Signed)
Physical Medicine and Rehabilitation Admission H&P    Chief Complaint  Patient presents with   Code Stroke  : HPI: Eddie Calhoun is a 71 year old left handed male with history of hypertension, recent bilateral carpal tunnel surgeries, BPH, quit smoking 33 years ago thrombocytopenia around 120,000.  Per chart review lives with spouse.  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  Presented 07/01/2021 with acute onset of left-sided weakness and left facial droop.  Cranial CT scan negative for acute process and patient did receive IV TNKase.  MRA head and neck no emergent finding.  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  Echocardiogram with ejection fraction of 60 to 65% no wall motion abnormalities.  Admission chemistries unremarkable except creatinine 1.26, glucose 108, platelets 120,000, hemoglobin 12.7.  Neurology follow-up placed on aspirin 81 mg daily and Plavix 75 mg daily for CVA prophylaxis.  Permissive hypertension and patient on Benicar 40 mg daily prior to admission.  Tolerating a regular consistency diet.  Therapy evaluations completed due to patient's left-sided weakness was admitted for a comprehensive rehab program.  Pt reports LBM today; voiding OK- no pain, but has some tenderness around CTS surgical sites on wrists R>L.   Review of Systems  Constitutional:  Negative for chills and fever.  HENT:  Negative for hearing loss.   Eyes:  Negative for blurred vision and double vision.  Respiratory:  Negative for cough and shortness of breath.   Cardiovascular:  Negative for chest pain, palpitations and leg swelling.  Gastrointestinal:  Positive for constipation. Negative for heartburn, nausea and vomiting.  Genitourinary:  Positive for urgency. Negative for dysuria, flank pain and hematuria.  Musculoskeletal:  Positive for myalgias.  Skin:  Negative for rash.  Neurological:  Positive for weakness.  All  other systems reviewed and are negative. Past Medical History:  Diagnosis Date   Allergy    allergic rhinitis   BPH (benign prostatic hyperplasia)    Elevated prostate specific antigen (PSA)    Hypertension    Pneumonia    Thrombocytopenia, unspecified ([place]) 07/26/2012   03/24/12  129,000!   No past surgical history on file. Family History  Problem Relation Age of Onset   Hypertension Mother    Hypertension Father    Cancer Father 32       father died with hx of prostate cancer   Social History:  reports that he quit smoking about 33 years ago. His smoking use included cigarettes. He has a 40.00 pack-year smoking history. He has never used smokeless tobacco. He reports that he does not drink alcohol. No history on file for drug use. Allergies:  Allergies  Allergen Reactions   Contrast Media [Iodinated Contrast Media] Hives   Medications Prior to Admission  Medication Sig Dispense Refill   cholecalciferol (VITAMIN D3) 25 MCG (1000 UNIT) tablet Take 1,000 Units by mouth daily.     Multiple Vitamin (MULTIVITAMIN) tablet Take 1 tablet by mouth daily.       olmesartan (BENICAR) 40 MG tablet Take 1 tablet (40 mg total) by mouth daily. 90 tablet 3   rosuvastatin (CRESTOR) 10 MG tablet Take 10 mg by mouth daily.     tamsulosin (FLOMAX) 0.4 MG CAPS capsule Take 0.4 mg by mouth daily after supper.     vitamin B-12 (CYANOCOBALAMIN) 1000 MCG tablet Take 1,000 mcg by mouth daily.        Home: Home Living Family/patient expects to be discharged  to:: Private residence Living Arrangements: Spouse/significant other Available Help at Discharge: Family, Available 24 hours/day Type of Home: House Home Access: Stairs to enter [employer] of Steps: 3 Home Layout: Multi-level, Able to live on main level with bedroom/bathroom Bathroom Shower/Tub: Multimedia programmer: Standard Home Equipment: None   Functional History: Prior Function Prior Level of Function :  Independent/Modified Independent, Driving ADLs Comments: Retired Water engineer. Loves golf and bowling. Very active.  Functional Status:  Mobility: Bed Mobility Overal bed mobility: Needs Assistance Bed Mobility: Supine to Sit, Sit to Supine Supine to sit: Min guard, HOB elevated Sit to supine: Min assist General bed mobility comments: pt required min assist for sit to supine to manage his legs. Transfers Overall transfer level: Needs assistance Equipment used: Rolling walker (2 wheels) Transfers: Sit to/from Stand, Bed to chair/wheelchair/BSC Sit to Stand: Min assist Bed to/from chair/wheelchair/BSC transfer type:: Step pivot Step pivot transfers: Min assist General transfer comment: Pt require min assistance standing up from the recliner as well as cuing for posture. Ambulation/Gait Ambulation/Gait assistance: Mod assist Gait Distance (Feet): 80 Feet Assistive device: Rolling walker (2 wheels) Gait Pattern/deviations: Step-to pattern, Knee hyperextension - left, Decreased dorsiflexion - left, Decreased stride length General Gait Details: Pt required mod assist with gait to help proximal stabilization of the hips in order to keep his pelvis over his foot. Ace wrap was used to maintain dorsiflexion during gait. Gait velocity: reduced Gait velocity interpretation: <1.8 ft/sec, indicate of risk for recurrent falls    ADL: ADL Overall ADL's : Needs assistance/impaired Eating/Feeding: Set up, Sitting Grooming: Minimal assistance, Sitting Upper Body Bathing: Moderate assistance, Sitting Lower Body Bathing: Maximal assistance, Sit to/from stand Upper Body Dressing : Moderate assistance, Sitting Lower Body Dressing: Maximal assistance, Sit to/from stand Toilet Transfer: Moderate assistance, Rolling walker (2 wheels), Ambulation Toilet Transfer Details (indicate cue type and reason): Mod A for maintaining balance with L left and sequencing LLE; blocking throughout for L  knee Functional mobility during ADLs: Moderate assistance, Rolling walker (2 wheels) General ADL Comments: Pt presenting with left sided weakness, decreased coordiantion, and left lateral leaning  Cognition: Cognition Overall Cognitive Status: Within Functional Limits for tasks assessed Arousal/Alertness: Awake/alert Orientation Level: Oriented X4 Year: 2023 Month: February Day of Week: Correct Attention: Focused, Sustained Focused Attention: Appears intact Sustained Attention: Appears intact Memory: Impaired Memory Impairment: Storage deficit (Immediate: 5/5; delayed: 2/5; with cues: 3/3) Awareness: Appears intact Problem Solving: Appears intact Executive Function: Reasoning, Sequencing, Organizing Reasoning: Appears intact Sequencing: Appears intact (clock drawing: 4/4) Organizing: Appears intact (backward digit span: 2/2) Cognition Arousal/Alertness: Awake/alert Behavior During Therapy: WFL for tasks assessed/performed Overall Cognitive Status: Within Functional Limits for tasks assessed Area of Impairment: Problem solving, Following commands Following Commands: Follows multi-step commands inconsistently, Follows multi-step commands with increased time Problem Solving: Slow processing, Requires verbal cues, Difficulty sequencing General Comments: Pt was motivated and looking forward to physical therapy today.  Physical Exam: Blood pressure 104/77, pulse (!) 55, temperature 97.8 F (36.6 C), temperature source Oral, resp. rate 16, height 5\' 10"  (1.778 m), weight 93.4 kg, SpO2 96 %. Physical Exam Vitals and nursing note reviewed. Exam conducted with a chaperone present.  Constitutional:      Appearance: Normal appearance.     Comments: Pt is sitting up in bedside chair; wife at bedside; appropriate, NAD  HENT:     Head: Normocephalic and atraumatic.     Comments: Tongue deviated to left Smile equal    Right Ear: External ear normal.  Left Ear: External ear normal.      Nose: Nose normal. No congestion.     Mouth/Throat:     Mouth: Mucous membranes are dry.     Pharynx: Oropharynx is clear. No oropharyngeal exudate.  Eyes:     General:        Right eye: No discharge.        Left eye: No discharge.     Extraocular Movements: Extraocular movements intact.     Comments: No nystagmus  Cardiovascular:     Rate and Rhythm: Normal rate and regular rhythm.     Heart sounds: Normal heart sounds. No murmur heard.   No gallop.  Pulmonary:     Effort: Pulmonary effort is normal. No respiratory distress.     Breath sounds: Normal breath sounds. No wheezing, rhonchi or rales.  Abdominal:     General: Abdomen is flat. Bowel sounds are normal. There is no distension.     Palpations: Abdomen is soft.     Tenderness: There is no abdominal tenderness.  Musculoskeletal:     Cervical back: Neck supple. No tenderness.     Comments: RUE/RLE 5/5 LUE 4-/5 in biceps, triceps, WE, grip and FA LLE- HF 3-/5; KE/KF 4-/5; DF 0/5 and PF 2/5  Skin:    Comments: L AC fossa IV- looks OK L CTS surgery incision on L wrist healed- done 12/22 R CTS incision scabbed over done 1/23  Neurological:     Mental Status: He is oriented to person, place, and time.     Sensory: No sensory deficit.     Comments: Patient is alert.  Makes eye contact with examiner.  Oriented x3 and follows commands.  Fair awareness of deficits. Intact to light touch in all 4 extremities  Psychiatric:        Mood and Affect: Mood normal.        Behavior: Behavior normal.    Results for orders placed or performed during the hospital encounter of 07/01/21 (from the past 48 hour(s))  Basic metabolic panel     Status: Abnormal   Collection Time: 07/03/21  5:00 AM  Result Value Ref Range   Sodium 140 135 - 145 mmol/L   Potassium 3.9 3.5 - 5.1 mmol/L   Chloride 111 98 - 111 mmol/L   CO2 23 22 - 32 mmol/L   Glucose, Bld 104 (H) 70 - 99 mg/dL    Comment: Glucose reference range applies only to samples taken  after fasting for at least 8 hours.   BUN 19 8 - 23 mg/dL   Creatinine, Ser 1.14 0.61 - 1.24 mg/dL   Calcium 8.7 (L) 8.9 - 10.3 mg/dL   GFR, Estimated >60 >60 mL/min    Comment: (NOTE) Calculated using the CKD-EPI Creatinine Equation (2021)    Anion gap 6 5 - 15    Comment: Performed at [facility] [address] [place], [place] [address]  CBC     Status: Abnormal   Collection Time: 07/03/21  5:00 AM  Result Value Ref Range   WBC 4.4 4.0 - 10.5 K/uL   RBC 3.66 (L) 4.22 - 5.81 MIL/uL   Hemoglobin 12.3 (L) 13.0 - 17.0 g/dL   HCT 35.1 (L) 39.0 - 52.0 %   MCV 95.9 80.0 - 100.0 fL   MCH 33.6 26.0 - 34.0 pg   MCHC 35.0 30.0 - 36.0 g/dL   RDW 12.5 11.5 - 15.5 %   Platelets 101 (L) 150 - 400 K/uL  Comment: Immature Platelet Fraction may be clinically indicated, consider ordering this additional test [id number] REPEATED TO VERIFY PLATELET COUNT CONFIRMED BY SMEAR    nRBC 0.0 0.0 - 0.2 %    Comment: Performed at [facility], [place] [address], [place], [place] [address]  Basic metabolic panel     Status: Abnormal   Collection Time: 07/04/21  4:07 AM  Result Value Ref Range   Sodium 140 135 - 145 mmol/L   Potassium 3.7 3.5 - 5.1 mmol/L   Chloride 107 98 - 111 mmol/L   CO2 23 22 - 32 mmol/L   Glucose, Bld 103 (H) 70 - 99 mg/dL    Comment: Glucose reference range applies only to samples taken after fasting for at least 8 hours.   BUN 17 8 - 23 mg/dL   Creatinine, Ser 1.15 0.61 - 1.24 mg/dL   Calcium 8.8 (L) 8.9 - 10.3 mg/dL   GFR, Estimated >60 >60 mL/min    Comment: (NOTE) Calculated using the CKD-EPI Creatinine Equation (2021)    Anion gap 10 5 - 15    Comment: Performed at [facility] [address] [place], [name] [address]  CBC     Status: Abnormal   Collection Time: 07/04/21  4:07 AM  Result Value Ref Range   WBC 4.7 4.0 - 10.5 K/uL   RBC 3.69 (L) 4.22 - 5.81 MIL/uL   Hemoglobin 12.3 (L) 13.0 - 17.0 g/dL   HCT 35.3 (L) 39.0 - 52.0 %   MCV 95.7  80.0 - 100.0 fL   MCH 33.3 26.0 - 34.0 pg   MCHC 34.8 30.0 - 36.0 g/dL   RDW 12.5 11.5 - 15.5 %   Platelets 102 (L) 150 - 400 K/uL    Comment: Immature Platelet Fraction may be clinically indicated, consider ordering this additional test [id number] CONSISTENT WITH PREVIOUS RESULT REPEATED TO VERIFY    nRBC 0.0 0.0 - 0.2 %    Comment: Performed at [facility], [place] [address], [place], [place] [address]   CT HEAD WO CONTRAST (5MM)  Result Date: 07/02/2021 CLINICAL DATA:  Stroke follow-up. EXAM: CT HEAD WITHOUT CONTRAST TECHNIQUE: Contiguous axial images were obtained from the base of the skull through the vertex without intravenous contrast. RADIATION DOSE REDUCTION: This exam was performed according to the departmental dose-optimization program which includes automated exposure control, adjustment of the mA and/or kV according to patient size and/or use of iterative reconstruction technique. COMPARISON:  CT dated 07/01/2021. MRI dated 07/02/2021 FINDINGS: Brain: Faint area of hypodensity involving the posterior right basal ganglia (20/4) may correspond to the infarcts seen on the MRI. The ventricles and sulci are appropriate size for patient's age. The gray-white matter discrimination is otherwise preserved. There is no acute intracranial hemorrhage. No mass effect or midline shift. No extra-axial fluid collection. Vascular: No hyperdense vessel or unexpected calcification. Skull: Normal. Negative for fracture or focal lesion. Sinuses/Orbits: No acute finding. Other: None IMPRESSION: 1. No acute intracranial hemorrhage. 2. Faint area of hypodensity involving the posterior right basal ganglia may correspond to the infarcts seen on the MRI. Electronically Signed   By: Anner Crete M.D.   On: 07/02/2021 21:20   MR ANGIO HEAD WO CONTRAST  Result Date: 07/02/2021 CLINICAL DATA:  Stroke follow-up EXAM: MRI HEAD WITHOUT CONTRAST MRA HEAD WITHOUT CONTRAST MRA OF THE NECK WITHOUT AND WITH  CONTRAST TECHNIQUE: Multiplanar, multi-echo pulse sequences of the brain and surrounding structures were acquired without intravenous contrast. Angiographic images  of the Circle of Willis were acquired using MRA technique without intravenous contrast. Angiographic images of the neck were acquired using MRA technique without and with intravenous contrast. Carotid stenosis measurements (when applicable) are obtained utilizing NASCET criteria, using the distal internal carotid diameter as the denominator. CONTRAST:  38mL GADAVIST GADOBUTROL 1 MMOL/ML IV SOLN COMPARISON:  Head CT from yesterday FINDINGS: MR HEAD FINDINGS Brain: Band of restricted diffusion at the posterior right putamen, corona radiata, and posterior caudate nucleus. No pre-existing ischemic injury. No hemorrhage, hydrocephalus, or collection Vascular: Arterial findings below Skull and upper cervical spine: Normal marrow signal. Facet spurring asymmetric to the left at C4-5. Sinuses/Orbits: Negative MRA HEAD FINDINGS Anterior circulation: Aplastic left A1 segment with fetal type left PCA. No branch occlusion, beading, or aneurysm. Negative for significant stenosis. Posterior circulation: Left dominant. The vertebral and basilar arteries are smoothly contoured and widely patent. No PCA branch occlusion. Minor right P3/4 segment stenosis. MRA NECK FINDINGS Antegrade flow in the carotid and vertebral arteries by time-of-flight. Unremarkable neck mask. Normal appearance of the arch and great vessels. Proximal subclavian, vertebral, and carotid arteries are smoothly contoured and widely patent on both sides. 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 Intracranial MRA: No emergent finding. No branch occlusion or stenosis to correlate with the infarct. Neck MRA: Negative. Electronically Signed   By: Jorje Guild M.D.   On: 07/02/2021 07:13   MR ANGIO NECK W WO CONTRAST  Result Date: 07/02/2021 CLINICAL  DATA:  Stroke follow-up EXAM: MRI HEAD WITHOUT CONTRAST MRA HEAD WITHOUT CONTRAST MRA OF THE NECK WITHOUT AND WITH CONTRAST TECHNIQUE: Multiplanar, multi-echo pulse sequences of the brain and surrounding structures were acquired without intravenous contrast. Angiographic images of the Circle of Willis were acquired using MRA technique without intravenous contrast. Angiographic images of the neck were acquired using MRA technique without and with intravenous contrast. Carotid stenosis measurements (when applicable) are obtained utilizing NASCET criteria, using the distal internal carotid diameter as the denominator. CONTRAST:  41mL GADAVIST GADOBUTROL 1 MMOL/ML IV SOLN COMPARISON:  Head CT from yesterday FINDINGS: MR HEAD FINDINGS Brain: Band of restricted diffusion at the posterior right putamen, corona radiata, and posterior caudate nucleus. No pre-existing ischemic injury. No hemorrhage, hydrocephalus, or collection Vascular: Arterial findings below Skull and upper cervical spine: Normal marrow signal. Facet spurring asymmetric to the left at C4-5. Sinuses/Orbits: Negative MRA HEAD FINDINGS Anterior circulation: Aplastic left A1 segment with fetal type left PCA. No branch occlusion, beading, or aneurysm. Negative for significant stenosis. Posterior circulation: Left dominant. The vertebral and basilar arteries are smoothly contoured and widely patent. No PCA branch occlusion. Minor right P3/4 segment stenosis. MRA NECK FINDINGS Antegrade flow in the carotid and vertebral arteries by time-of-flight. Unremarkable neck mask. Normal appearance of the arch and great vessels. Proximal subclavian, vertebral, and carotid arteries are smoothly contoured and widely patent on both sides. 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 Intracranial MRA: No emergent finding. No branch occlusion or stenosis to correlate with the infarct. Neck MRA: Negative. Electronically Signed    By: Jorje Guild M.D.   On: 07/02/2021 07:13   MR BRAIN WO CONTRAST  Result Date: 07/02/2021 CLINICAL DATA:  Stroke follow-up EXAM: MRI HEAD WITHOUT CONTRAST MRA HEAD WITHOUT CONTRAST MRA OF THE NECK WITHOUT AND WITH CONTRAST TECHNIQUE: Multiplanar, multi-echo pulse sequences of the brain and surrounding structures were acquired without intravenous contrast. Angiographic images of the Circle of  Willis were acquired using MRA technique without intravenous contrast. Angiographic images of the neck were acquired using MRA technique without and with intravenous contrast. Carotid stenosis measurements (when applicable) are obtained utilizing NASCET criteria, using the distal internal carotid diameter as the denominator. CONTRAST:  33mL GADAVIST GADOBUTROL 1 MMOL/ML IV SOLN COMPARISON:  Head CT from yesterday FINDINGS: MR HEAD FINDINGS Brain: Band of restricted diffusion at the posterior right putamen, corona radiata, and posterior caudate nucleus. No pre-existing ischemic injury. No hemorrhage, hydrocephalus, or collection Vascular: Arterial findings below Skull and upper cervical spine: Normal marrow signal. Facet spurring asymmetric to the left at C4-5. Sinuses/Orbits: Negative MRA HEAD FINDINGS Anterior circulation: Aplastic left A1 segment with fetal type left PCA. No branch occlusion, beading, or aneurysm. Negative for significant stenosis. Posterior circulation: Left dominant. The vertebral and basilar arteries are smoothly contoured and widely patent. No PCA branch occlusion. Minor right P3/4 segment stenosis. MRA NECK FINDINGS Antegrade flow in the carotid and vertebral arteries by time-of-flight. Unremarkable neck mask. Normal appearance of the arch and great vessels. Proximal subclavian, vertebral, and carotid arteries are smoothly contoured and widely patent on both sides. 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 Intracranial MRA: No emergent finding.  No branch occlusion or stenosis to correlate with the infarct. Neck MRA: Negative. Electronically Signed   By: Jorje Guild M.D.   On: 07/02/2021 07:13   ECHOCARDIOGRAM COMPLETE  Result Date: 07/02/2021    ECHOCARDIOGRAM REPORT   Patient Name:   Eddie Calhoun Date of Exam: 07/02/2021 Medical Rec #:  [id number]       Height:       70.0 in Accession #:    [id number]      Weight:       205.9 lb Date of Birth:  Feb 14, 1951       BSA:          2.113 m Patient Age:    79 years        BP:           158/78 mmHg Patient Gender: M               HR:           52 bpm. Exam Location:  Inpatient Procedure: 2D Echo, 3D Echo, Cardiac Doppler, Color Doppler and Strain Analysis Indications:     CVA  History:         Patient has prior history of Echocardiogram examinations, most                  recent 03/11/2006. Risk Factors:Hypertension. 03/11/2006 stress                  echo.  Sonographer:     Luisa Hart RDCS Referring Phys:  [id number] ASHISH ARORA Diagnosing Phys: Rozann Lesches MD IMPRESSIONS  1. Left ventricular ejection fraction, by estimation, is 60 to 65%. The left ventricle has normal function. The left ventricle has no regional wall motion abnormalities. There is mild eccentric left ventricular hypertrophy of the basal segment. Left ventricular diastolic parameters were normal. Normal global longitudinal strain of -18.4%.  2. The right ventricle is mildly enlarged with prominent apical trabeculation. Right ventricular function is normal. Estimated RVSP normal at 23 mmHg.  3. The mitral valve is grossly normal. Mild mitral valve regurgitation.  4. The aortic valve is tricuspid. Aortic valve regurgitation is not visualized. Aortic valve sclerosis is present, with no evidence of aortic valve  stenosis. Aortic valve mean gradient measures 5.0 mmHg.  5. The inferior vena cava is normal in size with greater than 50% respiratory variability, suggesting right atrial pressure of 3 mmHg. Comparison(s): Prior images unable to be  directly viewed. FINDINGS  Left Ventricle: Left ventricular ejection fraction, by estimation, is 60 to 65%. The left ventricle has normal function. The left ventricle has no regional wall motion abnormalities. The left ventricular internal cavity size was normal in size. There is  mild eccentric left ventricular hypertrophy of the basal segment. Left ventricular diastolic parameters were normal. Right Ventricle: The right ventricular size is mildly enlarged. No increase in right ventricular wall thickness. Right ventricular systolic function is normal. There is normal pulmonary artery systolic pressure. The tricuspid regurgitant velocity is 2.21  m/s, and with an assumed right atrial pressure of 3 mmHg, the estimated right ventricular systolic pressure is 28.3 mmHg. Left Atrium: Left atrial size was normal in size. Right Atrium: Right atrial size was normal in size. Pericardium: There is no evidence of pericardial effusion. Mitral Valve: The mitral valve is grossly normal. Mild mitral valve regurgitation. Tricuspid Valve: The tricuspid valve is grossly normal. Tricuspid valve regurgitation is trivial. Aortic Valve: The aortic valve is tricuspid. Aortic valve regurgitation is not visualized. Aortic valve sclerosis is present, with no evidence of aortic valve stenosis. Aortic valve mean gradient measures 5.0 mmHg. Aortic valve peak gradient measures 9.9  mmHg. Aortic valve area, by VTI measures 2.84 cm. Pulmonic Valve: The pulmonic valve was grossly normal. Pulmonic valve regurgitation is trivial. Aorta: The aortic root is normal in size and structure. Venous: The inferior vena cava is normal in size with greater than 50% respiratory variability, suggesting right atrial pressure of 3 mmHg. IAS/Shunts: No atrial level shunt detected by color flow Doppler.  LEFT VENTRICLE PLAX 2D LVIDd:         5.00 cm     Diastology LVIDs:         2.40 cm     LV e' medial:    7.62 cm/s LV PW:         0.70 cm     LV E/e' medial:  8.6  LV IVS:        1.10 cm     LV e' lateral:   10.40 cm/s LVOT diam:     2.10 cm     LV E/e' lateral: 6.3 LV SV:         101 LV SV Index:   48 LVOT Area:     3.46 cm  LV Volumes (MOD) LV vol d, MOD A2C: 58.5 ml LV vol d, MOD A4C: 81.1 ml LV vol s, MOD A2C: 25.4 ml LV vol s, MOD A4C: 25.0 ml LV SV MOD A2C:     33.1 ml LV SV MOD A4C:     81.1 ml LV SV MOD BP:      44.2 ml RIGHT VENTRICLE RV Basal diam:  5.10 cm RV Mid diam:    4.30 cm RV S prime:     13.30 cm/s TAPSE (M-mode): 3.2 cm LEFT ATRIUM             Index        RIGHT ATRIUM           Index LA diam:        2.70 cm 1.28 cm/m   RA Area:     20.30 cm LA Vol (A2C):   53.3 ml 25.22 ml/m  RA Volume:  61.80 ml  29.24 ml/m LA Vol (A4C):   40.9 ml 19.35 ml/m LA Biplane Vol: 47.3 ml 22.38 ml/m  AORTIC VALVE                     PULMONIC VALVE AV Area (Vmax):    2.74 cm      PV Vmax:          1.16 m/s AV Area (Vmean):   2.74 cm      PV Vmean:         76.600 cm/s AV Area (VTI):     2.84 cm      PV VTI:           0.245 m AV Vmax:           157.00 cm/s   PV Peak grad:     5.3 mmHg AV Vmean:          105.000 cm/s  PV Mean grad:     2.5 mmHg AV VTI:            0.356 m       PR End Diast Vel: 3.36 msec AV Peak Grad:      9.9 mmHg AV Mean Grad:      5.0 mmHg LVOT Vmax:         124.00 cm/s LVOT Vmean:        83.200 cm/s LVOT VTI:          0.292 m LVOT/AV VTI ratio: 0.82  AORTA Ao Root diam: 3.10 cm Ao Asc diam:  3.50 cm MITRAL VALVE               TRICUSPID VALVE MV Area (PHT): 2.99 cm    TR Peak grad:   19.5 mmHg MV Decel Time: 254 msec    TR Vmax:        221.00 cm/s MV E velocity: 65.50 cm/s MV A velocity: 68.40 cm/s  SHUNTS MV E/A ratio:  0.96        Systemic VTI:  0.29 m                            Systemic Diam: 2.10 cm Rozann Lesches MD Electronically signed by Rozann Lesches MD Signature Date/Time: 07/02/2021/10:54:31 AM    Final (Updated)       Blood pressure 104/77, pulse (!) 55, temperature 97.8 F (36.6 C), temperature source Oral, resp. rate 16, height 5'  10" (1.778 m), weight 93.4 kg, SpO2 96 %.  Medical Problem List and Plan: 1. 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  Status post IV TNKase  -patient may  shower  -ELOS/Goals: 10-12 days mod I to supervision 2.  Antithrombotics: -DVT/anticoagulation:  Mechanical:  Antiembolism stockings, knee (TED hose) Bilateral lower extremities  -antiplatelet therapy: Aspirin 81 mg daily and Plavix 75 mg daily 3. Pain Management: Tylenol as needed 4. Mood: Provide emotional support  -antipsychotic agents: N/A 5. Neuropsych: This patient is capable of making decisions on his own behalf. 6. Skin/Wound Care: Routine skin checks 7. Fluids/Electrolytes/Nutrition: Routine in and outs with follow-up chemistries 8.  Hypertension.  Avapro resumed at 300 mg daily.  Monitor with increased mobility 9.  BPH.  Flomax 0.4 mg daily.  Check PVR 10.  History of chronic thrombocytopenia.  Platelets 120,000-154000.  Follow-up CBC 11.  Hyperlipidemia.  Crestor 12.  Recent bilateral carpal  tunnel surgeries.  Follow-up outpatient- can use gloves to protect wrists/hands on walker/assistive device   I have personally performed a face to face diagnostic evaluation of this patient and formulated the key components of the plan.  Additionally, I have personally reviewed laboratory data, imaging studies, as well as relevant notes and concur with the physician assistant's documentation above.   The patient's status has not changed from the original H&P.  Any changes in documentation from the acute care chart have been noted above.       Lavon Paganini Angiulli, PA-C 07/04/2021

## 2021-07-04 NOTE — Progress Notes (Signed)
Inpatient Rehabilitation Admissions Coordinator   CIR bed is available to admit him today. I met with patient and his wife at bedside and they are in agreement. Dr Reeves Forth, acute team and [facility] made aware. I will make the arrangements.  Danne Baxter, RN, MSN Rehab Admissions Coordinator [phone] 07/04/2021 11:38 AM

## 2021-07-05 DIAGNOSIS — I6381 Other cerebral infarction due to occlusion or stenosis of small artery: Secondary | ICD-10-CM | POA: Diagnosis not present

## 2021-07-05 LAB — COMPREHENSIVE METABOLIC PANEL
ALT: 19 U/L (ref 0–44)
AST: 21 U/L (ref 15–41)
Albumin: 3.3 g/dL — ABNORMAL LOW (ref 3.5–5.0)
Alkaline Phosphatase: 45 U/L (ref 38–126)
Anion gap: 9 (ref 5–15)
BUN: 17 mg/dL (ref 8–23)
CO2: 23 mmol/L (ref 22–32)
Calcium: 8.9 mg/dL (ref 8.9–10.3)
Chloride: 107 mmol/L (ref 98–111)
Creatinine, Ser: 1.21 mg/dL (ref 0.61–1.24)
GFR, Estimated: >60 mL/min (ref >60)
Glucose, Bld: 96 mg/dL (ref 70–99)
Potassium: 4 mmol/L (ref 3.5–5.1)
Sodium: 139 mmol/L (ref 135–145)
Total Bilirubin: 0.9 mg/dL (ref 0.3–1.2)
Total Protein: 6.3 g/dL — ABNORMAL LOW (ref 6.5–8.1)

## 2021-07-05 LAB — CBC WITH DIFFERENTIAL/PLATELET
Abs Immature Granulocytes: 0.01 10*3/uL (ref 0.00–0.07)
Basophils Absolute: 0 10*3/uL (ref 0.0–0.1)
Basophils Relative: 0 %
Eosinophils Absolute: 0.2 10*3/uL (ref 0.0–0.5)
Eosinophils Relative: 4 %
HCT: 36.8 % — ABNORMAL LOW (ref 39.0–52.0)
Hemoglobin: 12.5 g/dL — ABNORMAL LOW (ref 13.0–17.0)
Immature Granulocytes: 0 %
Lymphocytes Relative: 22 %
Lymphs Abs: 1 10*3/uL (ref 0.7–4.0)
MCH: 32.7 pg (ref 26.0–34.0)
MCHC: 34 g/dL (ref 30.0–36.0)
MCV: 96.3 fL (ref 80.0–100.0)
Monocytes Absolute: 0.4 10*3/uL (ref 0.1–1.0)
Monocytes Relative: 9 %
Neutro Abs: 2.9 10*3/uL (ref 1.7–7.7)
Neutrophils Relative %: 65 %
Platelets: 99 10*3/uL — ABNORMAL LOW (ref 150–400)
RBC: 3.82 MIL/uL — ABNORMAL LOW (ref 4.22–5.81)
RDW: 12.7 % (ref 11.5–15.5)
WBC: 4.4 10*3/uL (ref 4.0–10.5)
nRBC: 0 % (ref 0.0–0.2)

## 2021-07-05 NOTE — Plan of Care (Signed)
?  Problem: RH Balance ?Goal: LTG Patient will maintain dynamic standing with ADLs (OT) ?Description: LTG:  Patient will maintain dynamic standing balance with assist during activities of daily living (OT)  ?Flowsheets (Taken 07/05/2021 1255) ?LTG: Pt will maintain dynamic standing balance during ADLs with: Supervision/Verbal cueing ?  ?Problem: Sit to Stand ?Goal: LTG:  Patient will perform sit to stand in prep for activites of daily living with assistance level (OT) ?Description: LTG:  Patient will perform sit to stand in prep for activites of daily living with assistance level (OT) ?Flowsheets (Taken 07/05/2021 1255) ?LTG: PT will perform sit to stand in prep for activites of daily living with assistance level: Supervision/Verbal cueing ?  ?Problem: RH Eating ?Goal: LTG Patient will perform eating w/assist, cues/equip (OT) ?Description: LTG: Patient will perform eating with assist, with/without cues using equipment (OT) ?Flowsheets (Taken 07/05/2021 1255) ?LTG: Pt will perform eating with assistance level of: Independent with assistive device  ?  ?Problem: RH Grooming ?Goal: LTG Patient will perform grooming w/assist,cues/equip (OT) ?Description: LTG: Patient will perform grooming with assist, with/without cues using equipment (OT) ?Flowsheets (Taken 07/05/2021 1255) ?LTG: Pt will perform grooming with assistance level of: Set up assist  ?  ?Problem: RH Toileting ?Goal: LTG Patient will perform toileting task (3/3 steps) with assistance level (OT) ?Description: LTG: Patient will perform toileting task (3/3 steps) with assistance level (OT)  ?Flowsheets (Taken 07/05/2021 1255) ?LTG: Pt will perform toileting task (3/3 steps) with assistance level: Supervision/Verbal cueing ?  ?Problem: RH Functional Use of Upper Extremity ?Goal: LTG Patient will use RT/LT upper extremity as a (OT) ?Description: LTG: Patient will use right/left upper extremity as a stabilizer/gross assist/diminished/nondominant/dominant level with assist,  with/without cues during functional activity (OT) ?Flowsheets (Taken 07/05/2021 1255) ?LTG: Use of upper extremity in functional activities: LUE as nondominant level ?LTG: Pt will use upper extremity in functional activity with assistance level of: Independent ?  ?Problem: RH Toilet Transfers ?Goal: LTG Patient will perform toilet transfers w/assist (OT) ?Description: LTG: Patient will perform toilet transfers with assist, with/without cues using equipment (OT) ?Flowsheets (Taken 07/05/2021 1255) ?LTG: Pt will perform toilet transfers with assistance level of: Supervision/Verbal cueing ?  ?Problem: RH Tub/Shower Transfers ?Goal: LTG Patient will perform tub/shower transfers w/assist (OT) ?Description: LTG: Patient will perform tub/shower transfers with assist, with/without cues using equipment (OT) ?Flowsheets (Taken 07/05/2021 1255) ?LTG: Pt will perform tub/shower stall transfers with assistance level of: Supervision/Verbal cueing ?  ?

## 2021-07-05 NOTE — Progress Notes (Signed)
Inpatient Rehabilitation Care Coordinator ?Assessment and Plan ?Patient Details  ?Name: Eddie Calhoun ?MRN: [id number] ?Date of Birth: November 06, 1950 ? ?Today's Date: 07/05/2021 ? ?Hospital Problems: Principal Problem: ?  Lacunar infarction [facility]) ? ?Past Medical History:  ?Past Medical History:  ?Diagnosis Date  ? Allergy   ? allergic rhinitis  ? BPH (benign prostatic hyperplasia)   ? Elevated prostate specific antigen (PSA)   ? Hypertension   ? Plantar fasciitis of right foot   ? Pneumonia   ? Thrombocytopenia, unspecified ([place]) 07/26/2012  ? 03/24/12  129,000!  ? ?Past Surgical History: History reviewed. No pertinent surgical history. ?Social History:  reports that he quit smoking about 33 years ago. His smoking use included cigarettes. He has a 40.00 pack-year smoking history. He has never used smokeless tobacco. He reports that he does not drink alcohol and does not use drugs. ? ?Family / Support Systems ?Marital Status: Married ?Patient Roles: Spouse ?Spouse/Significant Other: Nunzio Cory [phone] ?Other Supports: Friends and church members ?Anticipated Caregiver: wife ?Ability/Limitations of Caregiver: no limitations ?Caregiver Availability: 24/7 ?Family Dynamics: Close knit with wife and friends and church members. He feels he has good supports and has no concerns regarding this ? ?Social History ?Preferred language: English ?Religion:  ?Cultural Background: No issues ?Education: [employer] educated ?Health Literacy - How often do you need to have someone help you when you read instructions, pamphlets, or other written material from your doctor or pharmacy?: Never ?Writes: Yes ?Employment Status: Retired ?Legal History/Current Legal Issues: no issues ?Guardian/Conservator: none According to MD pt is capable of making his own decisions while here  ? ?Abuse/Neglect ?Abuse/Neglect Assessment Can Be Completed: Yes ?Physical Abuse: Denies ?Verbal Abuse: Denies ?Sexual Abuse: Denies ?Exploitation of patient/patient's  resources: Denies ?Self-Neglect: Denies ? ?Patient response to: ?Social Isolation - How often do you feel lonely or isolated from those around you?: Never ? ?Emotional Status ?Pt's affect, behavior and adjustment status: Pt is motivated and glad to be here, he feels his stroke deficits were made much lessen with the TPA he received. He reports he got the the shot within 9 mintues of getting here ?Recent Psychosocial Issues: healthy recently had carpel tunnel surgery ?Psychiatric History: No history or issues ?Substance Abuse History: No issues ? ?Patient / Family Perceptions, Expectations & Goals ?Pt/Family understanding of illness & functional limitations: Pt is able to explain his stroke and deficits, he is getting better and making progress already so is very encouraged. He does talk with the MD and feels he has a good understanding of his treatment plan moving forward. ?Premorbid pt/family roles/activities: Husband, retiree, church member, friend, etc ?Anticipated changes in roles/activities/participation: resume ?Pt/family expectations/goals: Pt states: " I hope to get where I can go up and down steps to my second floor befire I leave here.' ? ?Community Resources ?Community Agencies: None ?Premorbid Home Care/DME Agencies: None ?Transportation available at discharge: Pt drove PTA, wife does drive ?Is the patient able to respond to transportation needs?: Yes ?In the past 12 months, has lack of transportation kept you from medical appointments or from getting medications?: No ?In the past 12 months, has lack of transportation kept you from meetings, work, or from getting things needed for daily living?: No ? ?Discharge Planning ?Living Arrangements: Spouse/significant other ?Support Systems: Spouse/significant other, Friends/neighbors, Social worker community ?Type of Residence: Private residence ?Insurance Resources: [employer], Multimedia programmer (specify) Web designer) ?Financial Screen Referred: Yes ?Living Expenses:  Own ?Money Management: Patient, Spouse ?Does the patient have any problems obtaining your medications?: No ?Home Management:  Both do this ?Patient/Family Preliminary Plans: Return home with wife who is in good health and able to assist if needed. Pt is hopeful he will do well and recover from his stroke. Aware of evaluations today and team conference on Tuesday ?Care Coordinator Anticipated Follow Up Needs: HH/OP ? ?Clinical Impression ?Pleasant gentleman who is motivated to do well here and make good progress. His wife is in good health and retired and can assist if needed. Will await evaluations and work on discharge needs. ? ?Elease Hashimoto ?07/05/2021, 10:36 AM ? ?  ?

## 2021-07-05 NOTE — Discharge Instructions (Addendum)
Inpatient Rehab Discharge Instructions ? ?Eddie Calhoun ?Discharge date and time: No discharge date for patient encounter.  ? ?Activities/Precautions/ Functional Status: ?Activity: activity as tolerated ?Diet: regular diet ?Wound Care: Routine skin checks ?Functional status:  ?___ No restrictions     ___ Walk up steps independently ?___ 24/7 supervision/assistance   ___ Walk up steps with assistance ?___ Intermittent supervision/assistance  ___ Bathe/dress independently ?___ Walk with walker     _x__ Bathe/dress with assistance ?___ Walk Independently    ___ Shower independently ?___ Walk with assistance    ___ Shower with assistance ?___ No alcohol     ___ Return to work/school ________ ? ?Special Instructions: ?No driving smoking or alcohol ? ? ? ? ? ? ?COMMUNITY REFERRALS UPON DISCHARGE:  ? ?Outpatient: PT & OT ?            Agency: [facility] [name] [address] Phone: [phone]  ?            Appointment Date/Time: MARCH 14 12:15-2:00 BOTH OT AND PT ? ?Medical Equipment/Items Ordered:BROUGHT ON OWN-HURRY CANE AND TUB SEAT ?                                                Agency/Supplier:NA ? ? ?STROKE/TIA DISCHARGE INSTRUCTIONS ?SMOKING Cigarette smoking nearly doubles your risk of having a stroke & is the single most alterable risk factor  ?If you smoke or have smoked in the last 12 months, you are advised to quit smoking for your health. Most of the excess cardiovascular risk related to smoking disappears within a year of stopping. ?Ask you doctor about anti-smoking medications ?[facility] Quit Line: 1-800-QUIT NOW ?Free Smoking Cessation Classes [phone]  ?CHOLESTEROL Know your levels; limit fat & cholesterol in your diet  ?Lipid Panel  ?   ?Component Value Date/Time  ? CHOL 136 07/02/2021 0134  ? TRIG 65 07/02/2021 0134  ? HDL 44 07/02/2021 0134  ? CHOLHDL 3.1 07/02/2021 0134  ? VLDL 13 07/02/2021 0134  ? [place] 79 07/02/2021 0134  ? ? ? Many patients benefit from  treatment even if their cholesterol is at goal. ?Goal: Total Cholesterol (CHOL) less than 160 ?Goal:  Triglycerides (TRIG) less than 150 ?Goal:  HDL greater than 40 ?Goal:  LDL (LDLCALC) less than 100 ?  ?BLOOD PRESSURE American Stroke Association blood pressure target is less that 120/80 mm/Hg  ?Your discharge blood pressure is:  BP: 120/82 Monitor your blood pressure ?Limit your salt and alcohol intake ?Many individuals will require more than one medication for high blood pressure  ?DIABETES (A1c is a blood sugar average for last 3 months) Goal HGBA1c is under 7% (HBGA1c is blood sugar average for last 3 months)  ?Diabetes: ?No known diagnosis of diabetes   ? ?Lab Results  ?Component Value Date  ? HGBA1C 5.5 07/02/2021  ? ? Your HGBA1c can be lowered with medications, healthy diet, and exercise. ?Check your blood sugar as directed by your physician ?Call your physician if you experience unexplained or low blood sugars.  ?PHYSICAL ACTIVITY/REHABILITATION Goal is 30 minutes at least 4 days per week  ?Activity: Increase activity slowly, ?Therapies: Physical Therapy: Home Health ?Return to work:  Activity decreases your risk of heart attack and stroke and makes your heart stronger.  It helps control your weight and blood pressure; helps you  relax and can improve your mood. ?Participate in a regular exercise program. ?Talk with your doctor about the best form of exercise for you (dancing, walking, swimming, cycling).  ?DIET/WEIGHT Goal is to maintain a healthy weight  ?Your discharge diet is:  ?Diet Order   ? ?       ?  Diet heart healthy/carb modified Room service appropriate? Yes; Fluid consistency: Thin  Diet effective now       ?  ? ?  ?  ? ?  ?  liquids ?Your height is:  Height: 5\' 10"  (177.8 cm) ?Your current weight is: Weight: 90.9 kg ?Your Body Mass Index (BMI) is:  BMI (Calculated): 28.75 Following the type of diet specifically designed for you will help prevent another stroke. ?Your goal weight range is:    ?Your goal Body Mass Index (BMI) is 19-24. ?Healthy food habits can help reduce 3 risk factors for stroke:  High cholesterol, hypertension, and excess weight.  ?RESOURCES Stroke/Support Group:  Call [phone] ?  ?STROKE EDUCATION PROVIDED/REVIEWED AND GIVEN TO PATIENT Stroke warning signs and symptoms ?How to activate emergency medical system (call 911). ?Medications prescribed at discharge. ?Need for follow-up after discharge. ?Personal risk factors for stroke. ?Pneumonia vaccine given: No ?Flu vaccine given: No ?My questions have been answered, the writing is legible, and I understand these instructions.  I will adhere to these goals & educational materials that have been provided to me after my discharge from the hospital.  ? ?  ? ? ?My questions have been answered and I understand these instructions. I will adhere to these goals and the provided educational materials after my discharge from the hospital. ? ?Patient/Caregiver Signature _______________________________ Date __________ ? ?Clinician Signature _______________________________________ Date __________ ? ?Please bring this form and your medication list with you to all your follow-up doctor's appointments.   ?

## 2021-07-05 NOTE — Progress Notes (Signed)
?                                                       PROGRESS NOTE ? ? ?Subjective/Complaints: ? ?Pt reports he got the wrong breakfast. But waiting for new one.  ? ?Also, slept OK.  ?Using SCDs, which feel good/better on leg- like "strengthening them" in some way.  ? ?Also notes, someone told him he was Diabetic- he's not.  ? ? ?ROS: ? ?Pt denies SOB, abd pain, CP, N/V/C/D, and vision changes ? ? ?Objective: ?  ?No results found. ?Recent Labs  ?  07/04/21 ?0407 07/05/21 ?0017  ?WBC 4.7 4.4  ?HGB 12.3* 12.5*  ?HCT 35.3* 36.8*  ?PLT 102* 99*  ? ?Recent Labs  ?  07/04/21 ?0407 07/05/21 ?4944  ?NA 140 139  ?K 3.7 4.0  ?CL 107 107  ?CO2 23 23  ?GLUCOSE 103* 96  ?BUN 17 17  ?CREATININE 1.15 1.21  ?CALCIUM 8.8* 8.9  ? ? ?Intake/Output Summary (Last 24 hours) at 07/05/2021 0920 ?Last data filed at 07/05/2021 0310 ?Gross per 24 hour  ?Intake 240 ml  ?Output 1400 ml  ?Net -1160 ml  ?  ? ?  ? ?Physical Exam: ?Vital Signs ?Blood pressure 120/82, pulse (!) 52, temperature 97.7 ?F (36.5 ?C), temperature source Oral, resp. rate 16, height 5\' 10"  (1.778 m), weight 90.9 kg, SpO2 95 %. ? ? ?General: awake, alert, appropriate, sitting up in bed; wrong breakfast tray at bedside; NAD ?HENT: conjugate gaze; oropharynx moist ?CV: regular rhythm; slightly bradycardic rate; no JVD ?Pulmonary: CTA B/L; no W/R/R- good air movement ?GI: soft, NT, ND, (+)BS ?Psychiatric: appropriate ?Neurological: Ox3- LUE elevated on pillow ?Musculoskeletal:  ?   Cervical back: Neck supple. No tenderness.  ?   Comments: RUE/RLE 5/5 ?LUE 4-/5 in biceps, triceps, WE, grip and FA ?LLE- HF 3-/5; KE/KF 4-/5; DF 0/5 and PF 2/5  ?Skin: ?   Comments: L AC fossa IV- looks OK ?L CTS surgery incision on L wrist healed- done 12/22 ?R CTS incision scabbed over done 1/23  ?Neurological:  ?   Mental Status: He is oriented to person, place, and time.  ?   Sensory: No sensory deficit.  ?   Comments: Patient is alert.  Makes eye contact with examiner.  Oriented x3 and follows  commands.  Fair awareness of deficits. ?Intact to light touch in all 4 extremities  ? ?Assessment/Plan: ?1. Functional deficits which require 3+ hours per day of interdisciplinary therapy in a comprehensive inpatient rehab setting. ?Physiatrist is providing close team supervision and 24 hour management of active medical problems listed below. ?Physiatrist and rehab team continue to assess barriers to discharge/monitor patient progress toward functional and medical goals ? ?Care Tool: ? ?Bathing ?   ?   ?   ?  ?  ?Bathing assist   ?  ?  ?Upper Body Dressing/Undressing ?Upper body dressing   ?  ?   ?Upper body assist   ?   ?Lower Body Dressing/Undressing ?Lower body dressing ? ? ?   ?  ? ?  ? ?Lower body assist   ?   ? ?Toileting ?Toileting    ?Toileting assist   ?  ?  ?Transfers ?Chair/bed transfer ? ?Transfers assist ?   ? ?  ?  ?  ?Locomotion ?Ambulation ? ? ?  Ambulation assist ? ?   ? ?  ?  ?   ? ?Walk 10 feet activity ? ? ?Assist ?   ? ?  ?   ? ?Walk 50 feet activity ? ? ?Assist   ? ?  ?   ? ? ?Walk 150 feet activity ? ? ?Assist   ? ?  ?  ?  ? ?Walk 10 feet on uneven surface  ?activity ? ? ?Assist   ? ? ?  ?   ? ?Wheelchair ? ? ? ? ?Assist   ?  ?  ? ?  ?   ? ? ?Wheelchair 50 feet with 2 turns activity ? ? ? ?Assist ? ?  ?  ? ? ?   ? ?Wheelchair 150 feet activity  ? ? ? ?Assist ?   ? ? ?   ? ?Blood pressure 120/82, pulse (!) 52, temperature 97.7 ?F (36.5 ?C), temperature source Oral, resp. rate 16, height 5\' 10"  (1.778 m), weight 90.9 kg, SpO2 95 %. ? ?Medical Problem List and Plan: ?1. 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  Status post IV TNKase ?            -patient may  shower ?            -ELOS/Goals: 10-12 days mod I to supervision ? First day of evaluations- Con't CIR- PT and OT- elevate LUE when needed- but has no swleling ?2.  Antithrombotics: ?-DVT/anticoagulation:  Mechanical:  Antiembolism stockings,  knee (TED hose) Bilateral lower extremities ?            -antiplatelet therapy: Aspirin 81 mg daily and Plavix 75 mg daily ?3. Pain Management: Tylenol as needed ?4. Mood: Provide emotional support ?            -antipsychotic agents: N/A ?5. Neuropsych: This patient is capable of making decisions on his own behalf. ?6. Skin/Wound Care: Routine skin checks ?7. Fluids/Electrolytes/Nutrition: Routine in and outs with follow-up chemistries ? 3/1- looking great- reviewed independently;- con't to monitor weekly.  ?8.  Hypertension.  Avapro resumed at 300 mg daily.  Monitor with increased mobility ? 3/1- BP controlled this AM and bradycardic due to fitness level- con't regimen ?9.  BPH.  Flomax 0.4 mg daily.  Check PVR ?10.  History of chronic thrombocytopenia.  Platelets 120,000-154000 at baseline.  Follow-up CBC ? 3/1- Plts 99k- was 102 yesterday- will monitor since on Lovenox ?11.  Hyperlipidemia.  Crestor ?12.  Recent bilateral carpal tunnel surgeries.  Follow-up outpatient- can use gloves to protect wrists/hands on walker/assistive device ?  ?  ? ?LOS: ?1 days ?A FACE TO FACE EVALUATION WAS PERFORMED ? ?Eddie Calhoun ?07/05/2021, 9:20 AM  ? ? ? ?

## 2021-07-05 NOTE — Progress Notes (Signed)
Inpatient Rehabilitation Center ?Individual Statement of Services ? ?Patient Name:  Eddie Calhoun  ?Date:  07/05/2021 ? ?Welcome to the [facility].  Our goal is to provide you with an individualized program based on your diagnosis and situation, designed to meet your specific needs.  With this comprehensive rehabilitation program, you will be expected to participate in at least 3 hours of rehabilitation therapies Monday-Friday, with modified therapy programming on the weekends. ? ?Your rehabilitation program will include the following services:  Physical Therapy (PT), Occupational Therapy (OT), 24 hour per day rehabilitation nursing, Therapeutic Recreaction (TR), Care Coordinator, Rehabilitation Medicine, Nutrition Services, and Pharmacy Services ? ?Weekly team conferences will be held on Tuesday to discuss your progress.  Your Inpatient Rehabilitation Care Coordinator will talk with you frequently to get your input and to update you on team discussions.  Team conferences with you and your family in attendance may also be held. ? ?Expected length of stay: 10-14 days  Overall anticipated outcome: supervision level ? ?Depending on your progress and recovery, your program may change. Your Inpatient Rehabilitation Care Coordinator will coordinate services and will keep you informed of any changes. Your Inpatient Rehabilitation Care Coordinator's name and contact numbers are listed  below. ? ?The following services may also be recommended but are not provided by the [facility]:  ?Driving Evaluations ?Home Health Rehabiltiation Services ?Outpatient Rehabilitation Services ? ?  ?Arrangements will be made to provide these services after discharge if needed.  Arrangements include referral to agencies that provide these services. ? ?Your insurance has been verified to be:  [place] ?Your primary doctor is:  Josetta Huddle ? ?Pertinent information will be shared with your doctor  and your insurance company. ? ?Inpatient Rehabilitation Care Coordinator:  Ovidio Kin, [place] or (C) [phone] ? ?Information discussed with and copy given to patient by: Elease Hashimoto, 07/05/2021, 10:38 AM    ?

## 2021-07-05 NOTE — Progress Notes (Signed)
Occupational Therapy Session Note ? ?Patient Details  ?Name: Eddie Calhoun ?MRN: [id number] ?Date of Birth: 09/23/1950 ? ?Today's Date: 07/05/2021 ?OT Individual Time: 1345-1450 ?OT Individual Time Calculation (min): 65 min  ? ? ?Short Term Goals: ?Week 1:  OT Short Term Goal 1 (Week 1): Pt will perform ambulatory toilet transfer with CGA and RW ?OT Short Term Goal 2 (Week 1): Pt will perform shower level bathing CGA with AE PRN ?OT Short Term Goal 3 (Week 1): Pt will improve LUE to 3+/5 grossly ?OT Short Term Goal 4 (Week 1): Pt will perform 3/3 toileting tasks CGA ? ?Skilled Therapeutic Interventions/Progress Updates:  ?Pt greeted at time of session sitting up in recliner agreeable to OT session, no pain reported. Pt performing ambulatory transfer recliner > wheelchair Min A with RW. Pt transport via wheelchair for time <> gym and measured grip strength with dynamometer with results: R hand 42# and L hand 15#. Returned to room and pt performing shaving tasks at sink per request seated, able to attend to both sides of face and performed thoroughly without missing any areas and doing so safety, using R hand to shave and L hand as an assist. Remainder of session focused on LUE NMR with pt performing 4/4 hand writing activities for names and shapes, legible but with deficits. Pt performing 1 round of [place] activity with cut foam blocks for [place] reaching, retrieving, pinching, and placing in a target. Briefly reviewed theraputty exercises as well that can be performed in the room for grip strength, in hand translation, weight bearing and proprioceptive input, pinch strengthening, etc. And will continue to review. Wife present at this time and reviewed all with her as well. Pt requesting linen change, up in wheelchair and nursing aware pt request before laying down.  ? ? ?Therapy Documentation ?Precautions:  ?Precautions ?Precautions: Fall ?Precaution Comments: L hemi, B STR with R>L sensitivity ?Restrictions ?Weight Bearing  Restrictions: No ? ? ? ?Therapy/Group: Individual Therapy ? ?Viona Gilmore ?07/05/2021, 2:33 PM ?

## 2021-07-05 NOTE — Evaluation (Signed)
Occupational Therapy Assessment and Plan  Patient Details  Name: Eddie Calhoun MRN: [id number] Date of Birth: 02-21-1951  OT Diagnosis: abnormal posture, ataxia, hemiplegia affecting dominant side, and muscle weakness (generalized) Rehab Potential:   ELOS: 10-14 days   Today's Date: 07/05/2021 OT Individual Time: 2263-3354 OT Individual Time Calculation (min): 88 min     Hospital Problem: Principal Problem:   Lacunar infarction [facility])   Past Medical History:  Past Medical History:  Diagnosis Date   Allergy    allergic rhinitis   BPH (benign prostatic hyperplasia)    Elevated prostate specific antigen (PSA)    Hypertension    Plantar fasciitis of right foot    Pneumonia    Thrombocytopenia, unspecified ([place]) 07/26/2012   03/24/12  129,000!   Past Surgical History: History reviewed. No pertinent surgical history.  Assessment & Plan Clinical Impression:  Eddie Calhoun is a 71 year old left handed male with history of hypertension, recent bilateral carpal tunnel surgeries, BPH, quit smoking 33 years ago thrombocytopenia around 120,000.  Per chart review lives with spouse.  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  Presented 07/01/2021 with acute onset of left-sided weakness and left facial droop.  Cranial CT scan negative for acute process and patient did receive IV TNKase.  MRA head and neck no emergent finding.  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  Echocardiogram with ejection fraction of 60 to 65% no wall motion abnormalities.  Admission chemistries unremarkable except creatinine 1.26, glucose 108, platelets 120,000, hemoglobin 12.7.  Neurology follow-up placed on aspirin 81 mg daily and Plavix 75 mg daily for CVA prophylaxis.  Permissive hypertension and patient on Benicar 40 mg daily prior to admission.  Tolerating a regular consistency diet.  Therapy evaluations completed due to patient's left-sided  weakness was admitted for a comprehensive rehab program.Patient transferred to CIR on 07/04/2021 .    Patient currently requires mod with basic self-care skills secondary to muscle weakness, decreased cardiorespiratoy endurance, impaired timing and sequencing, abnormal tone, unbalanced muscle activation, ataxia, decreased coordination, and decreased motor planning, decreased motor planning, decreased attention, decreased problem solving, decreased safety awareness, and decreased memory, and decreased sitting balance, decreased standing balance, decreased postural control, hemiplegia, and decreased balance strategies.  Prior to hospitalization, patient could complete BADL and IADL with independent .  Patient will benefit from skilled intervention to decrease level of assist with basic self-care skills, increase independence with basic self-care skills, and increase level of independence with iADL prior to discharge home with care partner.  Anticipate patient will require intermittent supervision and follow up outpatient.  OT - End of Session Activity Tolerance: Tolerates 30+ min activity with multiple rests Endurance Deficit: Yes Endurance Deficit Description: fatigues quickly OT Assessment OT Patient demonstrates impairments in the following area(s): Balance;Cognition;Edema;Endurance;Motor;Safety OT Basic ADL's Functional Problem(s): Eating;Grooming;Bathing;Dressing;Toileting OT Transfers Functional Problem(s): Toilet;Tub/Shower OT Additional Impairment(s): Fuctional Use of Upper Extremity OT Plan OT Intensity: Minimum of 1-2 x/day, 45 to 90 minutes OT Frequency: 5 out of 7 days OT Duration/Estimated Length of Stay: 10-14 days OT Treatment/Interventions: Balance/vestibular training;Discharge planning;Functional electrical stimulation;Pain management;Self Care/advanced ADL retraining;Therapeutic Activities;UE/LE Coordination activities;Visual/perceptual remediation/compensation;Therapeutic  Exercise;Skin care/wound managment;Patient/family education;Functional mobility training;Disease mangement/prevention;Cognitive remediation/compensation;Community reintegration;DME/adaptive equipment instruction;Neuromuscular re-education;Psychosocial support;Splinting/orthotics;UE/LE Strength taining/ROM;Wheelchair propulsion/positioning OT Self Feeding Anticipated Outcome(s): Mod I OT Basic Self-Care Anticipated Outcome(s): Supervision OT Toileting Anticipated Outcome(s): Supervision OT Bathroom Transfers Anticipated Outcome(s): Supervision OT Recommendation Recommendations for Other Services: Therapeutic Recreation consult Therapeutic Recreation Interventions: Pet therapy;Stress  management Patient destination: Home Follow Up Recommendations: Outpatient OT Equipment Recommended: To be determined   OT Evaluation Precautions/Restrictions  Precautions Precautions: Fall Precaution Comments: L hemi, B STR with R>L sensitivity Restrictions Weight Bearing Restrictions: No Pain Pain Assessment Pain Scale: 0-10 Pain Score: 0-No pain Home Living/Prior Functioning Home Living Family/patient expects to be discharged to:: Private residence Living Arrangements: Spouse/significant other Available Help at Discharge: Family, Available 24 hours/day Type of Home: House Home Access: Stairs to enter Technical brewer of Steps: 3 Entrance Stairs-Rails: None Home Layout: Multi-level, Able to live on main level with bedroom/bathroom Bathroom Shower/Tub: Multimedia programmer: Standard Bathroom Accessibility: Yes  Lives With: Spouse IADL History Homemaking Responsibilities: Yes Prior Function Level of Independence: Independent with gait, Independent with homemaking with ambulation  Able to Take Stairs?: Yes Driving: Yes 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 Vision Baseline Vision/History: 1 Wears glasses Ability to See in  Adequate Light: 0 Adequate Patient Visual Report: No change from baseline Vision Assessment?: No apparent visual deficits Perception  Perception: Within Functional Limits Praxis Praxis: Impaired Praxis Impairment Details: Motor planning Cognition Overall Cognitive Status: Within Functional Limits for tasks assessed Arousal/Alertness: Awake/alert Orientation Level: (P) Person;Place;Situation Person: (P) Oriented Place: (P) Oriented Situation: (P) Oriented Year: 2023 Month: March Day of Week: Correct Memory: Impaired Memory Impairment: (P) Storage deficit Immediate Memory Recall: Sock;Blue;Bed Memory Recall Sock: Not able to recall Memory Recall Blue: Without Cue Memory Recall Bed: Without Cue Attention: Focused;Sustained Focused Attention: Appears intact Sustained Attention: Appears intact Awareness: Appears intact Problem Solving: Appears intact Reasoning: Appears intact Sequencing: Appears intact Organizing: Appears intact Safety/Judgment: Appears intact Sensation Sensation Light Touch: Appears Intact Hot/Cold: Appears Intact Proprioception: Appears Intact Stereognosis: Not tested Coordination Gross Motor Movements are Fluid and Coordinated: No Fine Motor Movements are Fluid and Coordinated: No Coordination and Movement Description: L hemi Finger Nose Finger Test: dysmetria, decreased body awareness Motor  Motor Motor: Hemiplegia Motor - Skilled Clinical Observations: L hemi plegia  Trunk/Postural Assessment  Cervical Assessment Cervical Assessment: Within Functional Limits Thoracic Assessment Thoracic Assessment: Within Functional Limits Lumbar Assessment Lumbar Assessment: Within Functional Limits Postural Control Postural Control: Deficits on evaluation  Balance Balance Balance Assessed: Yes Static Sitting Balance Static Sitting - Balance Support: Feet supported Static Sitting - Level of Assistance: 5: Stand by assistance Dynamic Sitting  Balance Dynamic Sitting - Balance Support: Feet supported Dynamic Sitting - Level of Assistance: 5: Stand by assistance Dynamic Sitting - Balance Activities: Lateral lean/weight shifting;Forward lean/weight shifting;Reaching for objects Static Standing Balance Static Standing - Balance Support: During functional activity;Bilateral upper extremity supported Static Standing - Level of Assistance: 4: Min assist Dynamic Standing Balance Dynamic Standing - Balance Support: Bilateral upper extremity supported;During functional activity Dynamic Standing - Level of Assistance: 4: Min assist;3: Mod assist Dynamic Standing - Balance Activities: Lateral lean/weight shifting;Forward lean/weight shifting;Reaching for objects Extremity/Trunk Assessment RUE Assessment RUE Assessment: Exceptions to [facility] General Strength Comments: grossly 4 to 4+/5 but limited grip 2/2 recent CTR LUE Assessment LUE Assessment: Exceptions to [facility] Active Range of Motion (AROM) Comments: WFL General Strength Comments: roughly 3/5, decreased grip 2/2 hemiplegia and recent CTR  Care Tool Care Tool Self Care Eating   Eating Assist Level: Set up assist    Oral Care    Oral Care Assist Level: Set up assist    Bathing   Body parts bathed by patient: Right arm;Left arm;Chest;Abdomen;Front perineal area;Right upper leg;Left upper leg;Buttocks;Face Body parts bathed by  helper: Right lower leg;Left lower leg;Buttocks   Assist Level: Moderate Assistance - Patient 50 - 74%    Upper Body Dressing(including orthotics)   What is the patient wearing?: Pull over shirt   Assist Level: Contact Guard/Touching assist    Lower Body Dressing (excluding footwear)   What is the patient wearing?: Pants Assist for lower body dressing: Moderate Assistance - Patient 50 - 74%    Putting on/Taking off footwear    Mod A         Care Tool Toileting Toileting activity   Assist for toileting: Minimal Assistance - Patient > 75%     Care  Tool Bed Mobility Roll left and right activity   Roll left and right assist level: Supervision/Verbal cueing    Sit to lying activity   Sit to lying assist level: Supervision/Verbal cueing    Lying to sitting on side of bed activity   Lying to sitting on side of bed assist level: the ability to move from lying on the back to sitting on the side of the bed with no back support.: Supervision/Verbal cueing     Care Tool Transfers Sit to stand transfer   Sit to stand assist level: Minimal Assistance - Patient > 75%    Chair/bed transfer   Chair/bed transfer assist level: Minimal Assistance - Patient > 75%     Toilet transfer   Assist Level: Moderate Assistance - Patient 50 - 74%     Care Tool Cognition  Expression of Ideas and Wants Expression of Ideas and Wants: 4. Without difficulty (complex and basic) - expresses complex messages without difficulty and with speech that is clear and easy to understand  Understanding Verbal and Non-Verbal Content Understanding Verbal and Non-Verbal Content: 4. Understands (complex and basic) - clear comprehension without cues or repetitions   Memory/Recall Ability Memory/Recall Ability : Current season;Location of own room;That he or she is in a hospital/hospital unit   Refer to Care Plan for [place] 1 OT Short Term Goal 1 (Week 1): Pt will perform ambulatory toilet transfer with CGA and RW OT Short Term Goal 2 (Week 1): Pt will perform shower level bathing CGA with AE PRN OT Short Term Goal 3 (Week 1): Pt will improve LUE to 3+/5 grossly OT Short Term Goal 4 (Week 1): Pt will perform 3/3 toileting tasks CGA  Recommendations for other services: Therapeutic Recreation  Pet therapy and Stress management   Skilled Therapeutic Intervention ADL ADL Eating: Set up Grooming: Supervision/safety Where Assessed-Grooming: Sitting at sink Upper Body Bathing: Minimal assistance Where Assessed-Upper Body Bathing: Sitting at  sink Lower Body Bathing: Moderate assistance Where Assessed-Lower Body Bathing: Standing at sink;Sitting at sink Upper Body Dressing: Contact guard Where Assessed-Upper Body Dressing: Sitting at sink Lower Body Dressing: Moderate assistance Where Assessed-Lower Body Dressing: Standing at sink Toileting: Moderate assistance Where Assessed-Toileting: Glass blower/designer: Moderate assistance Toilet Transfer Method: Ambulating Mobility  Transfers Sit to Stand: Minimal Assistance - Patient > 75% Stand to Sit: Minimal Assistance - Patient > 75%   Skilled Intervention: Pt greeted at time of session bed level resting agreeable to OT session and eval. Discussion and education with pt regarding purpose and plan for OT sessions. Pt stating breakfast had not come yet and he was awaiting tray which is why he would like to defer shower to tomorrow and sponge bathe today. Tray arrived at this time and supine > sit CGA, sitting EOB approx 20 minutes to eat  breakfast. Pt encouraged to attempt opening containers and small items, did so with difficulty. Once finished, short ambulatory transfer to sink level with Min/Mod A and seated at sink performred UB/LB bathing with Mod A overall. UB dress CGA and LB dress pants only with Mod A as well. Pt needing to use bathroom at this time, ambulated sink > bathroom > recliner with RW with Min/Mod A as pt needed assist getting over edge of bathroom entry. Cues to widen BOS and attempt to decrease knee hyperextension. 3/3 toileting tasks with Min A before walking to recliner. Set up alarm on call bell in reach. Note pt very verbose and needing extended time for all tasks.    Discharge Criteria: Patient will be discharged from OT if patient refuses treatment 3 consecutive times without medical reason, if treatment goals not met, if there is a change in medical status, if patient makes no progress towards goals or if patient is discharged from hospital.  The above  assessment, treatment plan, treatment alternatives and goals were discussed and mutually agreed upon: by patient  Viona Gilmore 07/05/2021, 12:42 PM

## 2021-07-05 NOTE — Progress Notes (Signed)
Inpatient Rehabilitation  Patient information reviewed and entered into eRehab system by Gaius Ishaq M. Ayub Kirsh, M.A., CCC/SLP, PPS Coordinator.  Information including medical coding, functional ability and quality indicators will be reviewed and updated through discharge.    

## 2021-07-05 NOTE — Evaluation (Signed)
Physical Therapy Assessment and Plan  Patient Details  Name: Eddie Calhoun MRN: [id number] Date of Birth: Jun 24, 1950  PT Diagnosis: Abnormality of gait, Coordination disorder, Difficulty walking, Edema, and Muscle weakness Rehab Potential: Excellent ELOS: 10-14 days   Today's Date: 07/05/2021 PT Individual Time: 4332-9518 PT Individual Time Calculation (min): 75 min    Hospital Problem: Principal Problem:   Lacunar infarction [facility])   Past Medical History:  Past Medical History:  Diagnosis Date   Allergy    allergic rhinitis   BPH (benign prostatic hyperplasia)    Elevated prostate specific antigen (PSA)    Hypertension    Plantar fasciitis of right foot    Pneumonia    Thrombocytopenia, unspecified ([place]) 07/26/2012   03/24/12  129,000!   Past Surgical History: History reviewed. No pertinent surgical history.  Assessment & Plan Clinical Impression: Eddie Calhoun is a 71 year old left handed male with history of hypertension, recent bilateral carpal tunnel surgeries, BPH, quit smoking 33 years ago thrombocytopenia around 120,000.  Per chart review lives with spouse.  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  Presented 07/01/2021 with acute onset of left-sided weakness and left facial droop.  Cranial CT scan negative for acute process and patient did receive IV TNKase.  MRA head and neck no emergent finding.  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  Echocardiogram with ejection fraction of 60 to 65% no wall motion abnormalities.  Admission chemistries unremarkable except creatinine 1.26, glucose 108, platelets 120,000, hemoglobin 12.7.  Neurology follow-up placed on aspirin 81 mg daily and Plavix 75 mg daily for CVA prophylaxis.  Permissive hypertension and patient on Benicar 40 mg daily prior to admission.  Tolerating a regular consistency diet.  Therapy evaluations completed due to patient's left-sided  weakness was admitted for a comprehensive rehab program.  Patient transferred to CIR on 07/04/2021 .   Patient currently requires min with mobility secondary to muscle weakness, impaired timing and sequencing, abnormal tone, decreased coordination, and decreased motor planning, and decreased standing balance, hemiplegia, and decreased balance strategies.  Prior to hospitalization, patient was independent  with mobility and lived with Spouse in a House home.  Home access is 3Stairs to enter.  Patient will benefit from skilled PT intervention to maximize safe functional mobility, minimize fall risk, and decrease caregiver burden for planned discharge home with 24 hour supervision.  Anticipate patient will benefit from follow up OP at discharge.  PT - End of Session Activity Tolerance: Tolerates 30+ min activity with multiple rests Endurance Deficit: Yes Endurance Deficit Description: fatigues quickly PT Assessment Rehab Potential (ACUTE/IP ONLY): Excellent PT Barriers to Discharge: Inaccessible home environment;Home environment access/layout PT Patient demonstrates impairments in the following area(s): Balance;Safety;Edema;Motor;Endurance PT Transfers Functional Problem(s): Bed Mobility;Bed to Chair;Car;Furniture;Floor PT Locomotion Functional Problem(s): Ambulation;Stairs PT Plan PT Intensity: Minimum of 1-2 x/day ,45 to 90 minutes PT Frequency: 5 out of 7 days PT Duration Estimated Length of Stay: 10-14 days PT Treatment/Interventions: Ambulation/gait training;Cognitive remediation/compensation;Discharge planning;DME/adaptive equipment instruction;Functional mobility training;Pain management;Psychosocial support;Splinting/orthotics;Therapeutic Activities;UE/LE Strength taining/ROM;Visual/perceptual remediation/compensation;Wheelchair propulsion/positioning;UE/LE Coordination activities;Therapeutic Exercise;Stair training;Patient/family education;Neuromuscular re-education;Functional electrical  stimulation;Disease management/prevention;Community reintegration;Balance/vestibular training;Skin care/wound management PT Transfers Anticipated Outcome(s): supervision PT Locomotion Anticipated Outcome(s): supervision PT Recommendation Follow Up Recommendations: Outpatient PT Patient destination: Home Equipment Recommended: Rolling walker with 5" wheels   PT Evaluation Precautions/Restrictions Precautions Precautions: Fall Precaution Comments: L hemi, B STR with R>L sensitivity Restrictions Weight Bearing Restrictions: No General   Vital Signs Pain Pain Assessment  Pain Scale: 0-10 Pain Score: 0-No pain Pain Interference Pain Interference Pain Effect on Sleep: 1. Rarely or not at all Pain Interference with Therapy Activities: 3. Frequently Pain Interference with Day-to-Day Activities: 2. 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 Vision/Perception  Vision - History Ability to See in Adequate Light: 0 Adequate Perception Perception: Within Functional Limits Praxis Praxis: Impaired Praxis Impairment Details: Motor planning  Cognition Overall Cognitive Status: Within Functional Limits for tasks assessed Arousal/Alertness: Awake/alert Orientation Level: Oriented X4 Year: 2023 Month: March Day of Week: Correct Attention:  Focused;Sustained Focused Attention: Appears intact Sustained Attention: Appears intact Memory: Impaired Memory Impairment: (P) Storage deficit Immediate Memory Recall: Sock;Blue;Bed Memory Recall Sock: Not able to recall Memory Recall Blue: Without Cue Memory Recall Bed: Without Cue Awareness: Appears intact Problem Solving: Appears intact Reasoning: Appears intact Sequencing: Appears intact Organizing: Appears intact Safety/Judgment: Appears intact Sensation Sensation Light Touch: Appears Intact Hot/Cold: Appears Intact Proprioception: Appears Intact Stereognosis: Not tested Coordination Gross Motor Movements are Fluid and Coordinated: No Fine Motor Movements are Fluid and Coordinated: No Coordination and Movement Description: L hemi Finger Nose Finger Test: dysmetria, decreased body awareness Motor  Motor Motor: Hemiplegia Motor - Skilled Clinical Observations: L hemi plegia   Trunk/Postural Assessment  Cervical Assessment Cervical Assessment: Within Functional Limits Thoracic Assessment Thoracic Assessment: Within Functional Limits Lumbar Assessment Lumbar Assessment: Within Functional Limits Postural Control Postural Control: Deficits on evaluation  Balance Balance Balance Assessed: Yes Static Sitting Balance Static Sitting - Balance Support: Feet supported Static Sitting - Level of Assistance: 5: Stand by assistance Dynamic Sitting Balance Dynamic Sitting - Balance Support: Feet supported Dynamic Sitting - Level of Assistance: 5: Stand by assistance Dynamic Sitting - Balance Activities: Lateral lean/weight shifting;Forward lean/weight shifting;Reaching for objects Static Standing Balance Static Standing - Balance Support: During functional activity;Bilateral upper extremity supported Static Standing - Level of Assistance: 4: Min assist Dynamic Standing Balance Dynamic Standing - Balance Support: Bilateral upper extremity supported;During functional  activity Dynamic Standing - Level of Assistance: 4: Min assist;3: Mod assist Dynamic Standing - Balance Activities: Lateral lean/weight shifting;Forward lean/weight shifting;Reaching for objects Extremity Assessment  RUE Assessment RUE Assessment: Exceptions to [facility] General Strength Comments: grossly 4 to 4+/5 but limited grip 2/2 recent CTR LUE Assessment LUE Assessment: Exceptions to [facility] Active Range of Motion (AROM) Comments: WFL General Strength Comments: roughly 3/5, decreased grip 2/2 hemiplegia and recent CTR RLE Assessment RLE Assessment: Within Functional Limits RLE Strength Right Hip Flexion: 5/5 Right Knee Flexion: 5/5 Right Knee Extension: 5/5 Right Ankle Dorsiflexion: 5/5 Right Ankle Plantar Flexion: 5/5 LLE Assessment LLE Assessment: Exceptions to [facility] LLE Strength Left Hip Flexion: 3+/5 Left Knee Flexion: 4-/5 Left Knee Extension: 3+/5 Left Ankle Dorsiflexion: 2/5 (inconsistent) Left Ankle Plantar Flexion: 1/5  Care Tool Care Tool Bed Mobility Roll left and right activity   Roll left and right assist level: Supervision/Verbal cueing    Sit to lying activity   Sit to lying assist level: Supervision/Verbal cueing    Lying to  sitting on side of bed activity   Lying to sitting on side of bed assist level: the ability to move from lying on the back to sitting on the side of the bed with no back support.: Supervision/Verbal cueing     Care Tool Transfers Sit to stand transfer   Sit to stand assist level: Minimal Assistance - Patient > 75%    Chair/bed transfer   Chair/bed transfer assist level: Minimal Assistance - Patient > 75%     Toilet transfer   Assist Level: Moderate Assistance - Patient 50 - 74%    Car transfer   Car transfer assist level: Minimal Assistance - Patient > 75%      Care Tool Locomotion Ambulation   Assist level: Minimal Assistance - Patient > 75% Assistive device: Walker-rolling Max distance: 300 ft  Walk 10 feet activity    Assist level: Minimal Assistance - Patient > 75% Assistive device: Walker-rolling   Walk 50 feet with 2 turns activity   Assist level: Minimal Assistance - Patient > 75% Assistive device: Walker-rolling  Walk 150 feet activity   Assist level: Minimal Assistance - Patient > 75% Assistive device: Walker-rolling  Walk 10 feet on uneven surfaces activity Walk 10 feet on uneven surfaces activity did not occur: Safety/medical concerns      Stairs   Assist level: Minimal Assistance - Patient > 75% Stairs assistive device: 2 hand rails Max number of stairs: 4  Walk up/down 1 step activity   Walk up/down 1 step (curb) assist level: Minimal Assistance - Patient > 75% Walk up/down 1 step or curb assistive device: 2 hand rails  Walk up/down 4 steps activity   Walk up/down 4 steps assist level: Minimal Assistance - Patient > 75% Walk up/down 4 steps assistive device: 2 hand rails  Walk up/down 12 steps activity Walk up/down 12 steps activity did not occur: Safety/medical concerns      Pick up small objects from floor Pick up small object from the floor (from standing position) activity did not occur: Safety/medical concerns      Wheelchair Is the patient using a wheelchair?: Yes Type of Wheelchair: Manual   Wheelchair assist level: Dependent - Patient 0%    Wheel 50 feet with 2 turns activity   Assist Level: Dependent - Patient 0%  Wheel 150 feet activity   Assist Level: Dependent - Patient 0%    Refer to Care Plan for Long Term Goals  SHORT TERM GOAL WEEK 1 PT Short Term Goal 1 (Week 1): Pt will navigate stairs with 1 hand rail PT Short Term Goal 2 (Week 1): Pt will ambulate with RW and CGA PT Short Term Goal 3 (Week 1): Pt will perform STS with supevision consistently  Recommendations for other services: None   Skilled Therapeutic Intervention Pt received in recliner and agreeable to therapy.  No complaint of pain. Evaluation completed (see details above and below) with  education on PT POC and goals and individual treatment initiated with focus on gait and transfers. After subjective exam, therapist retrieved w/c for energy conservation. MMT and sensory testing performed in sitting as documented above. ambulatory transfer to w/c with mod A and RW. Donned DF assist wrap tot A. Pt transported throughout unit for energy conservation. Car transfer with min A for balance while turning. Stair navigation 4 x 6" stairs with min A and both handrails, VC for technique for safety. Pt then ambulated ~300 ft with RW and min A. Demoes variable cadence, knee hyperextension, drifting  to R side. Pt returned to room and to recliner, was left with all needs in reach and alarm active.   Mobility Transfers Transfers: Sit to Stand;Stand Pivot Transfers;Stand to Sit Sit to Stand: Minimal Assistance - Patient > 75% Stand to Sit: Minimal Assistance - Patient > 75% Stand Pivot Transfers: Minimal Assistance - Patient > 75% Stand Pivot Transfer Details: Verbal cues for safe use of DME/AE;Verbal cues for precautions/safety;Visual cues for safe use of DME/AE;Verbal cues for technique Transfer (Assistive device): Rolling walker Locomotion  Gait Ambulation: Yes Gait Assistance: Minimal Assistance - Patient > 75% Gait Distance (Feet): 300 Feet Assistive device: Rolling walker Gait Assistance Details: Verbal cues for precautions/safety;Verbal cues for safe use of DME/AE;Verbal cues for gait pattern Gait Gait: Yes Gait Pattern: Impaired Gait Pattern: Left genu recurvatum;Lateral hip instability;Step-through pattern Gait velocity: reduced Stairs / Additional Locomotion Stairs: Yes Stairs Assistance: Minimal Assistance - Patient > 75% Stair Management Technique: Two rails Number of Stairs: 4 Height of Stairs: 6 Wheelchair Mobility Wheelchair Mobility: No   Discharge Criteria: Patient will be discharged from PT if patient refuses treatment 3 consecutive times without medical reason, if  treatment goals not met, if there is a change in medical status, if patient makes no progress towards goals or if patient is discharged from hospital.  The above assessment, treatment plan, treatment alternatives and goals were discussed and mutually agreed upon: by patient  Mickel Fuchs 07/05/2021, 12:42 PM

## 2021-07-05 NOTE — Progress Notes (Signed)
Patient and wife inquired as to why his olmesartan had not been resumed. I do not see ARB (Avapro) on his medication list. His BP has been normal last 24 hours. Explained rather than start his medicine tonight, we would follow-up in the morning. Wife would like patient to take his medication from home. ?

## 2021-07-06 MED ORDER — IRBESARTAN 300 MG PO TABS: 300.0000 mg | ORAL_TABLET | Freq: Every day | ORAL | Status: DC

## 2021-07-06 MED ORDER — IRBESARTAN 75 MG PO TABS
75.0000 mg | ORAL_TABLET | Freq: Every day | ORAL | Status: DC
Start: 2021-07-07 — End: 2021-07-13
  Administered 2021-07-07 – 2021-07-13 (×7): 75 mg via ORAL
  Filled 2021-07-06 (×7): qty 1

## 2021-07-06 NOTE — Progress Notes (Signed)
Physical Therapy Session Note ? ?Patient Details  ?Name: Eddie Calhoun ?MRN: [id number] ?Date of Birth: [birth year] ? ?Today's Date: 07/06/2021 ?PT Individual Time: 1000-1100 ?PT Individual Time Calculation (min): 60 min  ? ?Short Term Goals: ?Week 1:  PT Short Term Goal 1 (Week 1): Pt will navigate stairs with 1 hand rail ?PT Short Term Goal 2 (Week 1): Pt will ambulate with RW and CGA ?PT Short Term Goal 3 (Week 1): Pt will perform STS with supevision consistently ? ?Skilled Therapeutic Interventions/Progress Updates:  ?  Pt received in recliner and agreeable to therapy.  ambulatory transfer to w/c with out DF assist wrap, noted inversion and poor ankle control, with concern for incr fall risk. Donned DF assist wrap tot A. Pt transported to therapy gym for time management and energy conservation. Pt then performed BERG balance test and TUG as noted below: ? ?Patient demonstrates increased fall risk as noted by score of   26/56 on Berg Balance Scale.  (<36= high risk for falls, close to 100%; 37-45 significant >80%; 46-51 moderate >50%; 52-55 lower >25%)  ? ?TUG:  ?30 sec, 22 sec, 16 sec (avg 22 sec) ? ?Continued pt ed on POC and balance test items as proprioceptive training for gait. Pt then ambulated ~100 ft back to room with min A and RW, VC for step clearance and knee control with good improvement. Pt returned to recliner and was left with all needs in reach and alarm active.  ? ?Therapy Documentation ?Precautions:  ?Precautions ?Precautions: Fall ?Precaution Comments: L hemi, B STR with R>L sensitivity ?Restrictions ?Weight Bearing Restrictions: No ?General: ?  ?Balance: ?Balance ?Balance Assessed: Yes ?Standardized Balance Assessment ?Standardized Balance Assessment: Merrilee Jansky Balance Test ?Merrilee Jansky Balance Test ?Sit to Stand: Able to stand  independently using hands ?Standing Unsupported: Able to stand 2 minutes with supervision ?Sitting with Back Unsupported but Feet Supported on Floor or Stool: Able to sit safely and  securely 2 minutes ?Stand to Sit: Controls descent by using hands ?Transfers: Needs one person to assist ?Standing Unsupported with Eyes Closed: Able to stand 10 seconds with supervision ?Standing Ubsupported with Feet Together: Able to place feet together independently and stand for 1 minute with supervision ?From Standing, Reach Forward with Outstretched Arm: Reaches forward but needs supervision ?From Standing Position, Pick up Object from Floor: Able to pick up shoe, needs supervision ?From Standing Position, Turn to Look Behind Over each Shoulder: Needs supervision when turning ?Turn 360 Degrees: Needs assistance while turning ?Standing Unsupported, Alternately Place Feet on Step/Stool: Needs assistance to keep from falling or unable to try ?Standing Unsupported, One Foot in Front: Needs help to step but can hold 15 seconds ?Standing on One Leg: Able to lift leg independently and hold equal to or more than 3 seconds ?Total Score: 28 ?Timed Up and Go Test ?TUG: Normal TUG ?Normal TUG (seconds): 22 (16 sec at fastest) ? ? ? ? ?Therapy/Group: Individual Therapy ? ?[place] ?07/06/2021, 1:33 PM  ?

## 2021-07-06 NOTE — Progress Notes (Signed)
Occupational Therapy Session Note ? ?Patient Details  ?Name: Eddie Calhoun ?MRN: [id number] ?Date of Birth: [birth year] ? ?Today's Date: 07/07/2021 ?OT Individual Time: 4627-0350 ?OT Individual Time Calculation (min): 75 min  ? ?Short Term Goals: ?Week 1:  OT Short Term Goal 1 (Week 1): Pt will perform ambulatory toilet transfer with CGA and RW ?OT Short Term Goal 2 (Week 1): Pt will perform shower level bathing CGA with AE PRN ?OT Short Term Goal 3 (Week 1): Pt will improve LUE to 3+/5 grossly ?OT Short Term Goal 4 (Week 1): Pt will perform 3/3 toileting tasks CGA ? ?Skilled Therapeutic Interventions/Progress Updates:  ?  Pt greeted in the recliner with no c/o pain. Declining shower, motivated to work on his affected Lt side during session. Pt had a deck of cards in his hands and was working on shuffling them to work on fine motor control. Min A for short distance ambulatory transfer to the w/c using RW for standing support, vcs for widening base of support. Pt was escorted to the dayroom. Worked on [employer] and Lt fine motor coordination by participating in a guided activity using pennies. Pt worked on [employer], flipping pennies, and transferring pennies into bin. Focus placed on strengthening of hand intrinsics during in hand manipulations and when utilizing pincer grasp. Tactile and verbal cues to address shoulder hike and compensatory strategies of trunk. Gentle stretching performed of Lt hand/forearm supinators with small hot packs on joints and muscle bellies due to fingers feeling "rigid." Transitioned to seated and standing level bowling as pt is on 2 bowling leagues. Min balance assist in standing while pt threw bowling ball using a 2 hand method (which he uses PTA). Pts affect visibly brightened during participation, CGA for sit<stand without UE and with 1 UE support during power up, no AD utilized in standing. He then returned to the room and completed a short distance ambulatory transfer  back to his recliner using RW with the same assistance and cuing. He remained sitting up, left with all needs within reach. Issued him a handout pertaining to therapeutic activities he could participate in at home to further work on [employer]. Pt appreciative. Tx focus placed on improving functional use of Lt hand as well as dynamic balance and pt education.  ? ?Therapy Documentation ?Precautions:  ?Precautions ?Precautions: Fall ?Precaution Comments: L hemi, B STR with R>L sensitivity ?Restrictions ?Weight Bearing Restrictions: No ?Vital Signs: ?Therapy Vitals ?Temp: 97.8 ?F (36.6 ?C) ?Temp Source: Oral ?Pulse Rate: 67 ?Resp: 18 ?BP: 128/77 ?Patient Position (if appropriate): Sitting ?Oxygen Therapy ?SpO2: 96 % ?O2 Device: Room Air ?ADL: ?ADL ?Eating: Set up ?Grooming: Supervision/safety ?Where Assessed-Grooming: Sitting at sink ?Upper Body Bathing: Minimal assistance ?Where Assessed-Upper Body Bathing: Sitting at sink ?Lower Body Bathing: Moderate assistance ?Where Assessed-Lower Body Bathing: Standing at sink, Sitting at sink ?Upper Body Dressing: Contact guard ?Where Assessed-Upper Body Dressing: Sitting at sink ?Lower Body Dressing: Moderate assistance ?Where Assessed-Lower Body Dressing: Standing at sink ?Toileting: Moderate assistance ?Where Assessed-Toileting: Toilet ?Toilet Transfer: Moderate assistance ?Toilet Transfer Method: Ambulating ? ?Therapy/Group: Individual Therapy ? ?Dimples Probus A Charlayne V[place] ?07/07/2021, 3:27 PM ?

## 2021-07-06 NOTE — Progress Notes (Signed)
?                                                       PROGRESS NOTE ? ? ?Subjective/Complaints: ? ? ?BP has been controlled ?Peeing frequently with urgency. Asking if needs Flomax.  ?LBM yesterday.  ? ?Was on Flomax prior for urgency. 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  ? ? ? ?ROS: ? ?Pt denies SOB, abd pain, CP, N/V/C/D, and vision changes ? ? ? ? ?Objective: ?  ?No results found. ?Recent Labs  ?  07/04/21 ?0407 07/05/21 ?3382  ?WBC 4.7 4.4  ?HGB 12.3* 12.5*  ?HCT 35.3* 36.8*  ?PLT 102* 99*  ? ?Recent Labs  ?  07/04/21 ?0407 07/05/21 ?5053  ?NA 140 139  ?K 3.7 4.0  ?CL 107 107  ?CO2 23 23  ?GLUCOSE 103* 96  ?BUN 17 17  ?CREATININE 1.15 1.21  ?CALCIUM 8.8* 8.9  ? ? ?Intake/Output Summary (Last 24 hours) at 07/06/2021 0818 ?Last data filed at 07/06/2021 0759 ?Gross per 24 hour  ?Intake 600 ml  ?Output 2100 ml  ?Net -1500 ml  ?  ? ?  ? ?Physical Exam: ?Vital Signs ?Blood pressure 122/74, pulse (!) 51, temperature 97.9 ?F (36.6 ?C), temperature source Oral, resp. rate 18, height 5\' 10"  (1.778 m), weight 90.9 kg, SpO2 95 %. ? ? ? ?General: awake, alert, appropriate, sitting up in bed; LUE elevated on pillow- wife on phone; NAD ?HENT: conjugate gaze; oropharynx moist ?CV: regular rhythm, bradycardic rate; no JVD ?Pulmonary: CTA B/L; no W/R/R- good air movement ?GI: soft, NT, ND, (+)BS ?Psychiatric: appropriate ?Neurological: Ox3 ? ?Musculoskeletal:  ?   Cervical back: Neck supple. No tenderness.  ?   Comments: RUE/RLE 5/5 ?LUE 4-/5 in biceps, triceps, WE, grip and FA ?LLE- HF 3-/5; KE/KF 4-/5; DF 0/5 and PF 2/5  ?Skin: ?   Comments: IV's out ?L CTS surgery incision on L wrist healed- done 12/22 ?R CTS incision scabbed over done 1/23  ?Neurological:  ?   Mental Status: He is oriented to person, place, and time.  ?   Sensory: No sensory deficit.  ?   Comments: Patient is alert.  Makes eye contact with examiner.  Oriented x3 and follows  commands.  Fair awareness of deficits. ?Intact to light touch in all 4 extremities  ? ?Assessment/Plan: ?1. Functional deficits which require 3+ hours per day of interdisciplinary therapy in a comprehensive inpatient rehab setting. ?Physiatrist is providing close team supervision and 24 hour management of active medical problems listed below. ?Physiatrist and rehab team continue to assess barriers to discharge/monitor patient progress toward functional and medical goals ? ?Care Tool: ? ?Bathing ?   ?Body parts bathed by patient: Right arm, Left arm, Chest, Abdomen, Front perineal area, Right upper leg, Left upper leg, Buttocks, Face  ? Body parts bathed by helper: Right lower leg, Left lower leg, Buttocks ?  ?  ?Bathing assist Assist Level: Moderate Assistance - Patient 50 - 74% ?  ?  ?Upper Body Dressing/Undressing ?Upper body dressing   ?What is the patient wearing?: Pull over shirt ?   ?Upper body assist Assist Level: Contact Guard/Touching assist ?   ?Lower Body Dressing/Undressing ?Lower body dressing ? ? ?   ?What is the  patient wearing?: Pants ? ?  ? ?Lower body assist Assist for lower body dressing: Moderate Assistance - Patient 50 - 74% ?   ? ?Toileting ?Toileting    ?Toileting assist Assist for toileting: Minimal Assistance - Patient > 75% ?  ?  ?Transfers ?Chair/bed transfer ? ?Transfers assist ?   ? ?Chair/bed transfer assist level: Minimal Assistance - Patient > 75% ?  ?  ?Locomotion ?Ambulation ? ? ?Ambulation assist ? ?   ? ?Assist level: Minimal Assistance - Patient > 75% ?Assistive device: Walker-rolling ?Max distance: 300 ft  ? ?Walk 10 feet activity ? ? ?Assist ?   ? ?Assist level: Minimal Assistance - Patient > 75% ?Assistive device: Walker-rolling  ? ?Walk 50 feet activity ? ? ?Assist   ? ?Assist level: Minimal Assistance - Patient > 75% ?Assistive device: Walker-rolling  ? ? ?Walk 150 feet activity ? ? ?Assist   ? ?Assist level: Minimal Assistance - Patient > 75% ?Assistive device:  Walker-rolling ?  ? ?Walk 10 feet on uneven surface  ?activity ? ? ?Assist Walk 10 feet on uneven surfaces activity did not occur: Safety/medical concerns ? ? ?  ?   ? ?Wheelchair ? ? ? ? ?Assist Is the patient using a wheelchair?: Yes ?Type of Wheelchair: Manual ?  ? ?Wheelchair assist level: Dependent - Patient 0% ?   ? ? ?Wheelchair 50 feet with 2 turns activity ? ? ? ?Assist ? ?  ?  ? ? ?Assist Level: Dependent - Patient 0%  ? ?Wheelchair 150 feet activity  ? ? ? ?Assist ?   ? ? ?Assist Level: Dependent - Patient 0%  ? ?Blood pressure 122/74, pulse (!) 51, temperature 97.9 ?F (36.6 ?C), temperature source Oral, resp. rate 18, height 5\' 10"  (1.778 m), weight 90.9 kg, SpO2 95 %. ? ?Medical Problem List and Plan: ?1. 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  Status post IV TNKase ?            -patient may  shower ?            -ELOS/Goals: 10-12 days mod I to supervision ? Con't CIR- PT and OT ?2.  Antithrombotics: ?-DVT/anticoagulation:  Mechanical:  Antiembolism stockings, knee (TED hose) Bilateral lower extremities ?            -antiplatelet therapy: Aspirin 81 mg daily and Plavix 75 mg daily ?3. Pain Management: Tylenol as needed ? 3/2- denies pain- con't regimen ?4. Mood: Provide emotional support ?            -antipsychotic agents: N/A ?5. Neuropsych: This patient is capable of making decisions on his own behalf. ?6. Skin/Wound Care: Routine skin checks ?7. Fluids/Electrolytes/Nutrition: Routine in and outs with follow-up chemistries ? 3/1- looking great- reviewed independently;- con't to monitor weekly.  ?8.  Hypertension.  Avapro resumed at 300 mg daily.  Monitor with increased mobility ? 3/1- BP controlled this AM and bradycardic due to fitness level- con't regimen ? 3/2- Avapro isn't on order list- hasn't received and BP is actually perfectly controlled- don't want to restart at 300 mg daily- will do 75 mg  daily- since could cause his BP to drop too low- and that could put him at risk for another stroke. Educated pt /wife on this.  ?9.  BPH.  Flomax 0.4 mg daily.  Check PVR ? 3/2- having urgency- however don't want to stop since has significant BPH per  wife; also don't want to change to another BPH med- could drop BP too much.  ?10.  History of chronic thrombocytopenia.  Platelets 120,000-154000 at baseline.  Follow-up CBC ? 3/1- Plts 99k- was 102 yesterday- will monitor since on Lovenox ?11.  Hyperlipidemia.  Crestor ?12.  Recent bilateral carpal tunnel surgeries.  Follow-up outpatient- can use gloves to protect wrists/hands on walker/assistive device ?  ? I spent a total of 51   minutes on total care today- >50% coordination of care- due to d/w nursing and PA- as well prolonged discussion with pt/wife about BPH and BP ? ? ?LOS: ?2 days ?A FACE TO FACE EVALUATION WAS PERFORMED ? ?Katanya Schlie ?07/06/2021, 8:18 AM  ? ? ? ?

## 2021-07-06 NOTE — Progress Notes (Addendum)
Occupational Therapy Session Note ? ?Patient Details  ?Name: Eddie Calhoun ?MRN: [id number] ?Date of Birth: Nov 13, 1950 ? ?Today's Date: 07/06/2021 ?OT Individual Time: 3546-5681 and 2751-7001 ?OT Individual Time Calculation (min): 70 min and 64 min ? ? ?Short Term Goals: ?Week 1:  OT Short Term Goal 1 (Week 1): Pt will perform ambulatory toilet transfer with CGA and RW ?OT Short Term Goal 2 (Week 1): Pt will perform shower level bathing CGA with AE PRN ?OT Short Term Goal 3 (Week 1): Pt will improve LUE to 3+/5 grossly ?OT Short Term Goal 4 (Week 1): Pt will perform 3/3 toileting tasks CGA ? ? ?Skilled Therapeutic Interventions/Progress Updates:  ?  Pt greeted at time of session on commode with NT present, hand off to OT. No c/o pain. Pt completed toileting tasks and doffed LB clothing on commode before short distance ambulation to shower bench CGA with RW, performed all transfers in this manner throughout session. Doffed remainder of clothing and OT provided set up for items needed, performed shower level bathing with CGA for standing portion, mostly seated on bench. Discussion regarding home set up and DME needs as well. Dried off seated before walking to bed level for dressing tasks, cues to don underwear and patns together for energy conservation. Pt able to don his own tennis shoes with Supervision including tying laces. Seated at sink for remainder of grooming and oral hygiene tasks with Set up. Ambulated from sink level > recliner CGA with RW, call bell in reach all needs met.  ? ?Session 2: Pt greeted at time of session sitting up in recliner agreeable to OT session with wife and friend present, wife remained for session. Pt already with ACE wrap applied to LLE from PT session, pt ambulating room > day room > ADL apartment all with CGA and RW, increased hip external rotation noted with fatigue. From edge of mat, pt focused on dynamic standing while playing bean bag toss, NMR for using LUE to reach for bean bag  and coordinate tossing at a target. Seated, pt catching bags with 2 hands for bimanual task and placing on L side. In ADL apartment, problem solved home shower set up and practiced posterior entry with various plcaement options for shower seat. Pt and wife aware of what to look for and purchase. Wife also assisting pt with short distance mobility briefly with CGA. Back in room, stand pivot to bed CGA and alarm on call bell in reach all needs met.  ? ?Therapy Documentation ?Precautions:  ?Precautions ?Precautions: Fall ?Precaution Comments: L hemi, B STR with R>L sensitivity ?Restrictions ?Weight Bearing Restrictions: No ? ? ? ? ?Therapy/Group: Individual Therapy ? ?Viona Gilmore ?07/06/2021, 7:18 AM ?

## 2021-07-07 NOTE — Progress Notes (Signed)
Physical Therapy Session Note ? ?Patient Details  ?Name: Eddie Calhoun ?MRN: [id number] ?Date of Birth: 1950/10/20 ? ?Today's Date: 07/07/2021 ?PT Individual Time: 1100-1156 and 3009-2330 ?PT Individual Time Calculation (min): 56 min and 55 min ? ?Short Term Goals: ?Week 1:  PT Short Term Goal 1 (Week 1): Pt will navigate stairs with 1 hand rail ?PT Short Term Goal 2 (Week 1): Pt will ambulate with RW and CGA ?PT Short Term Goal 3 (Week 1): Pt will perform STS with supevision consistently ? ?Skilled Therapeutic Interventions/Progress Updates:  ?Treatment Session 1 ?Received pt sitting in recliner, pt agreeable to PT treatment, and denied any pain during session. Session with emphasis on functional mobility/transfers, generalized strengthening and endurance, NMR, dynamic standing balance/coordination, and gait training. Sit<>stand with RW and CGA and ambulated 64ft with RW and CGA to WC. Pt transported to/from room in [facility] dependently for time management purposes. Sit<>stand with RW and CGA and ambulated 143ft with RW and CGA/min A without DF ace wrap - pt ambulates with narrow BOS/mild scissoring, decreased L toe clearance, slight ankle inversion, and with varying step lengths on LLE - cues for heel strike, appropriate step length, and for upright posture. Pt performed BUE/LE strengthening on Nustep at workload 8 increasing to workload 10 for 8 minutes for a total of 342 steps with emphasis on cardiovascular endurance and reciprocal movement training. Stand<>pivot Nustep<>WC with RW and CGA and worked on squats 4x10 without UE support and min A for balance using mirror for visual feedback - cues to weight shift into heels and tp prevent L knee valgus. Transitioned to LLE toe taps to 6in step with min HHA 2x10 using mirror for visual feedback with emphasis on control, coordination, and weight shifting. Pt ambulated 26ft with RW and CGA. Concluded session with pt sitting in recliner, needs within reach, and seatbelt alarm  on.  ? ?Treatment Session 2 ?Received pt sitting in recliner with RN present at bedside and NT present to check vitals. Pt agreeable to PT treatment and denied any pain during session. Session with emphasis on functional mobility/transfers, generalized strengthening and endurance, NMR, and dynamic standing balance/coordination. Pt performed all transfers with RW and CGA and ambulated 106ft with RW and CGA to WC. Pt washed hands at sink mod I and transported to/from room in [facility] dependently for time management purposes. Sit<>stand<>tall kneeling with min A and performed 2x15 reverse nordic hamstring curls with BUE support and emphasis on glute strength - cues to squeeze buttocks at top - stopped due to "cramp" in R hamstring. Tall kneeling<>stand<>sitting in WC and performed seated R hamstring stretch 3x15 second hold and pt reported relief. Sit<>stand<>tall kneeling again with min A and performed lateral "walking" on knees x 5 laps along width of bench with emphasis on hip abd/add strength with pt demonstrating increased weakness in adductors>abductors. Transitioned to quadruped alternating LE lifts x12 reps with min/mod for balance - stopped due to increased discomfort along palms of hands from carpal tunnel surgery. Worked on blocked practice sit<>stands on Airex without UE support and CGA x10 reps increasing to additional x10 reps with 4.4lb medicine ball and CGA. Then worked on reaction time tossing ball on rebounder while standing on Airex with CGA for balance for 1 minute x 2 trials. Stand<>pivot mat<>WC with RW and CGA and ambulated 6ft with RW and CGA back to recliner. Concluded session with pt sitting in recliner with all needs within reach.  ? ?Therapy Documentation ?Precautions:  ?Precautions ?Precautions: Fall ?Precaution Comments: L  hemi, B STR with R>L sensitivity ?Restrictions ?Weight Bearing Restrictions: No ? ?Therapy/Group: Individual Therapy ?Blenda Nicely ?Becky Sax PT, DPT  ? ?07/07/2021, 7:35  AM  ?

## 2021-07-07 NOTE — Plan of Care (Signed)
Nutrition Education Note ? ?RD consulted for nutrition diet education. ? ?Lipid Panel  ?   ?Component Value Date/Time  ? CHOL 136 07/02/2021 0134  ? TRIG 65 07/02/2021 0134  ? HDL 44 07/02/2021 0134  ? CHOLHDL 3.1 07/02/2021 0134  ? VLDL 13 07/02/2021 0134  ? [place] 79 07/02/2021 0134  ? ? ?RD provided "Heart Healthy Nutrition Therapy" handout from the Academy of [employer]. Reviewed patient's dietary recall. Provided examples on ways to decrease sodium and fat intake in diet. Discouraged intake of processed foods and use of salt shaker. Encouraged fresh fruits and vegetables as well as whole grain sources of carbohydrates to maximize fiber intake. Teach back method used. ? ?Expect good compliance. ? ?Current diet order is heart healthy/carbohydrate modified, patient is consuming approximately 100% of meals at this time. Labs and medications reviewed. No further nutrition interventions warranted at this time. RD contact information provided. If additional nutrition issues arise, please re-consult RD. ? ?Eddie Parker, MS, RD, LDN ?RD pager number/after hours weekend pager number on Amion. ? ? ?

## 2021-07-07 NOTE — Progress Notes (Signed)
?                                                       PROGRESS NOTE ? ? ?Subjective/Complaints: ? ? ?BP controlled still- explained to pt restarted Avapro but at low dose for cardiac protection/renal protection.  ? ?Pt agreeable, but will monitor BP.  ?Feels like L side is getting somewhat stronger- coordination still off- tried ot feed self with L side- is L handed. Went ok, but not great.  ? ?Asking if dietician can go over stroke diet with him.  ? ?ROS: ? ?Pt denies SOB, abd pain, CP, N/V/C/D, and vision changes ? ? ? ? ? ? ?Objective: ?  ?No results found. ?Recent Labs  ?  07/05/21 ?4975  ?WBC 4.4  ?HGB 12.5*  ?HCT 36.8*  ?PLT 99*  ? ?Recent Labs  ?  07/05/21 ?3005  ?NA 139  ?K 4.0  ?CL 107  ?CO2 23  ?GLUCOSE 96  ?BUN 17  ?CREATININE 1.21  ?CALCIUM 8.9  ? ? ?Intake/Output Summary (Last 24 hours) at 07/07/2021 0800 ?Last data filed at 07/07/2021 1102 ?Gross per 24 hour  ?Intake 240 ml  ?Output 1475 ml  ?Net -1235 ml  ?  ? ?  ? ?Physical Exam: ?Vital Signs ?Blood pressure 125/81, pulse (!) 57, temperature 97.9 ?F (36.6 ?C), temperature source Oral, resp. rate 15, height 5\' 10"  (1.778 m), weight 90.9 kg, SpO2 95 %. ? ? ? ? ?General: awake, alert, appropriate, sitting up in bed; NAD ?HENT: conjugate gaze; oropharynx moist ?CV: regular rate and rhythm; no JVD ?Pulmonary: CTA B/L; no W/R/R- good air movement ?GI: soft, NT, ND, (+)BS ?Psychiatric: appropriate ?Neurological: Ox3 ? ?Musculoskeletal:  ?   Cervical back: Neck supple. No tenderness.  ?   Comments: RUE/RLE 5/5 ?LUE- 4+/5 in all muscles ?LLE- HF/KE 4+/5; DF 2-/5; and PF 3+/5- has improved ?Skin: ?   Comments: IV's out ?L CTS surgery incision on L wrist healed- done 12/22 ?R CTS incision scabbed over done 1/23  ?Neurological:  ?   Mental Status: He is oriented to person, place, and time.  ?   Sensory: No sensory deficit.  ?   Comments: Patient is alert.  Makes eye contact with examiner.  Oriented x3 and follows commands.  Fair awareness of deficits. ?Intact to  light touch in all 4 extremities  ? ?Assessment/Plan: ?1. Functional deficits which require 3+ hours per day of interdisciplinary therapy in a comprehensive inpatient rehab setting. ?Physiatrist is providing close team supervision and 24 hour management of active medical problems listed below. ?Physiatrist and rehab team continue to assess barriers to discharge/monitor patient progress toward functional and medical goals ? ?Care Tool: ? ?Bathing ?   ?Body parts bathed by patient: Right arm, Left arm, Chest, Abdomen, Front perineal area, Right upper leg, Left upper leg, Buttocks, Face  ? Body parts bathed by helper: Right lower leg, Left lower leg, Buttocks ?  ?  ?Bathing assist Assist Level: Contact Guard/Touching assist ?  ?  ?Upper Body Dressing/Undressing ?Upper body dressing   ?What is the patient wearing?: Pull over shirt ?   ?Upper body assist Assist Level: Supervision/Verbal cueing ?   ?Lower Body Dressing/Undressing ?Lower body dressing ? ? ?   ?What is the patient wearing?: Pants, Underwear/pull up ? ?  ? ?Lower body  assist Assist for lower body dressing: Minimal Assistance - Patient > 75% ?   ? ?Toileting ?Toileting    ?Toileting assist Assist for toileting: Contact Guard/Touching assist ?  ?  ?Transfers ?Chair/bed transfer ? ?Transfers assist ?   ? ?Chair/bed transfer assist level: Contact Guard/Touching assist ?  ?  ?Locomotion ?Ambulation ? ? ?Ambulation assist ? ?   ? ?Assist level: Minimal Assistance - Patient > 75% ?Assistive device: Walker-rolling ?Max distance: 300 ft  ? ?Walk 10 feet activity ? ? ?Assist ?   ? ?Assist level: Minimal Assistance - Patient > 75% ?Assistive device: Walker-rolling  ? ?Walk 50 feet activity ? ? ?Assist   ? ?Assist level: Minimal Assistance - Patient > 75% ?Assistive device: Walker-rolling  ? ? ?Walk 150 feet activity ? ? ?Assist   ? ?Assist level: Minimal Assistance - Patient > 75% ?Assistive device: Walker-rolling ?  ? ?Walk 10 feet on uneven surface   ?activity ? ? ?Assist Walk 10 feet on uneven surfaces activity did not occur: Safety/medical concerns ? ? ?  ?   ? ?Wheelchair ? ? ? ? ?Assist Is the patient using a wheelchair?: Yes ?Type of Wheelchair: Manual ?  ? ?Wheelchair assist level: Dependent - Patient 0% ?   ? ? ?Wheelchair 50 feet with 2 turns activity ? ? ? ?Assist ? ?  ?  ? ? ?Assist Level: Dependent - Patient 0%  ? ?Wheelchair 150 feet activity  ? ? ? ?Assist ?   ? ? ?Assist Level: Dependent - Patient 0%  ? ?Blood pressure 125/81, pulse (!) 57, temperature 97.9 ?F (36.6 ?C), temperature source Oral, resp. rate 15, height 5\' 10"  (1.778 m), weight 90.9 kg, SpO2 95 %. ? ?Medical Problem List and Plan: ?1. 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  Status post IV TNKase ?            -patient may  shower ?            -ELOS/Goals: 10-12 days mod I to supervision ? Con't CIR- PT and OT ?2.  Antithrombotics: ?-DVT/anticoagulation:  Mechanical:  Antiembolism stockings, knee (TED hose) Bilateral lower extremities ?            -antiplatelet therapy: Aspirin 81 mg daily and Plavix 75 mg daily ?3. Pain Management: Tylenol as needed ? 3/2- denies pain- con't regimen ?4. Mood: Provide emotional support ?            -antipsychotic agents: N/A ?5. Neuropsych: This patient is capable of making decisions on his own behalf. ?6. Skin/Wound Care: Routine skin checks ?7. Fluids/Electrolytes/Nutrition: Routine in and outs with follow-up chemistries ? 3/1- looking great- reviewed independently;- con't to monitor weekly.  ?8.  Hypertension.  Avapro resumed at 300 mg daily.  Monitor with increased mobility ? 3/1- BP controlled this AM and bradycardic due to fitness level- con't regimen ? 3/2- Avapro isn't on order list- hasn't received and BP is actually perfectly controlled- don't want to restart at 300 mg daily- will do 75 mg daily- since could cause his BP to drop too low- and  that could put him at risk for another stroke. Educated pt /wife on this.  ? 3/3- BP running 423N-361W systolic- will start Avapro 75 mg daily this AM- will monitor closely.  ?9.  BPH.  Flomax 0.4 mg daily.  Check PVR ? 3/2- having urgency- however don't want to stop since has significant  BPH per wife; also don't want to change to another BPH med- could drop BP too much.  ?10.  History of chronic thrombocytopenia.  Platelets 120,000-154000 at baseline.  Follow-up CBC ? 3/1- Plts 99k- was 102 yesterday- will monitor since on Lovenox ? 3/3- labs MOnday ?11.  Hyperlipidemia.  Crestor ?12.  Recent bilateral carpal tunnel surgeries.  Follow-up outpatient- can use gloves to protect wrists/hands on walker/assistive device ?13. Dispo ? 3/3- pt asking for dietician for d/w pt/wife about dietary changes.  ? ?I spent a total of 38   minutes on total care today- >50% coordination of care- due to [facility] and d/w pt about car-e prolonged.  ? ? ? ? ?LOS: ?3 days ?A FACE TO FACE EVALUATION WAS PERFORMED ? ?Jaimie Redditt ?07/07/2021, 8:00 AM  ? ? ? ?

## 2021-07-07 NOTE — IPOC Note (Signed)
Overall Plan of Care (IPOC) ?Patient Details ?Name: Eddie Calhoun ?MRN: [id number] ?DOB: 12-20-1950 ? ?Admitting Diagnosis: Lacunar infarction [facility]) ? ?Hospital Problems: Principal Problem: ?  Lacunar infarction [facility]) ? ? ? ? Functional Problem List: ?Nursing Bowel, Edema, Endurance, Motor, Safety  ?PT Balance, Safety, Edema, Motor, Endurance  ?OT Balance, Cognition, Edema, Endurance, Motor, Safety  ?SLP    ?TR    ?    ? Basic ADL?s: ?OT Eating, Grooming, Bathing, Dressing, Toileting  ? ?  Advanced  ADL?s: ?OT    ?   ?Transfers: ?PT Bed Mobility, Bed to Chair, Car, Furniture, Floor  ?OT Toilet, Tub/Shower  ? ?  Locomotion: ?PT Ambulation, Stairs  ? ?  Additional Impairments: ?OT Fuctional Use of Upper Extremity  ?SLP   ?  ?   ?TR    ? ? ?Anticipated Outcomes ?Item Anticipated Outcome  ?Self Feeding Mod I  ?Swallowing ?   ?  ?Basic self-care ? Supervision  ?Toileting ? Supervision ?  ?Bathroom Transfers Supervision  ?Bowel/Bladder ? supervision  ?Transfers ? supervision  ?Locomotion ? supervision  ?Communication ?    ?Cognition ?    ?Pain ? n/a  ?Safety/Judgment ? supervision and no falls  ? ?Therapy Plan: ?PT Intensity: Minimum of 1-2 x/day ,45 to 90 minutes ?PT Frequency: 5 out of 7 days ?PT Duration Estimated Length of Stay: 10-14 days ?OT Intensity: Minimum of 1-2 x/day, 45 to 90 minutes ?OT Frequency: 5 out of 7 days ?OT Duration/Estimated Length of Stay: 10-14 days ?   ? ?Due to the current state of emergency, patients may not be receiving their 3-hours of Medicare-mandated therapy. ? ? Team Interventions: ?Nursing Interventions Patient/Family Education, Bowel Management, Disease Management/Prevention, Discharge Planning  ?PT interventions Ambulation/gait training, Cognitive remediation/compensation, Discharge planning, DME/adaptive equipment instruction, Functional mobility training, Pain management, Psychosocial support, Splinting/orthotics, Therapeutic Activities, UE/LE Strength taining/ROM,  Visual/perceptual remediation/compensation, Wheelchair propulsion/positioning, UE/LE Coordination activities, Therapeutic Exercise, Stair training, Patient/family education, Neuromuscular re-education, Functional electrical stimulation, Disease management/prevention, Community reintegration, Training and development officer, Skin care/wound management  ?OT Interventions Balance/vestibular training, Discharge planning, Functional electrical stimulation, Pain management, Self Care/advanced ADL retraining, Therapeutic Activities, UE/LE Coordination activities, Visual/perceptual remediation/compensation, Therapeutic Exercise, Skin care/wound managment, Patient/family education, Functional mobility training, Disease mangement/prevention, Cognitive remediation/compensation, Community reintegration, DME/adaptive equipment instruction, Neuromuscular re-education, Psychosocial support, Splinting/orthotics, UE/LE Strength taining/ROM, Wheelchair propulsion/positioning  ?SLP Interventions    ?TR Interventions    ?SW/CM Interventions Discharge Planning, Psychosocial Support, Patient/Family Education  ? ?Barriers to Discharge ?MD  Medical stability, Home enviroment access/loayout, Lack of/limited family support, and Weight bearing restrictions  ?Nursing Decreased caregiver support, Home environment access/layout, Incontinence, Lack of/limited family support, Weight ?Multi-level, 3 steps, no rails. Main level bedroom/bathroom. Spouse can provide 24/7 care.  ?PT Inaccessible home environment, Home environment access/layout ?   ?OT   ?   ?SLP   ?   ?SW   ?   ? ?Team Discharge Planning: ?Destination: PT-Home ,OT- Home , SLP-  ?Projected Follow-up: PT-Outpatient PT, OT-  Outpatient OT, SLP-  ?Projected Equipment Needs: PT-Rolling walker with 5" wheels, OT- To be determined, SLP-  ?Equipment Details: PT- , OT-  ?Patient/family involved in discharge planning: PT- Patient,  OT-Patient, SLP-  ? ?MD ELOS: 10-14 days ?Medical Rehab Prognosis:   Good ?Assessment:  ?The patient has been admitted for CIR therapies with the diagnosis of R BG stroke/lacunar infarct. The team will be addressing functional mobility, strength, stamina, balance, safety, adaptive techniques and equipment, self-care, bowel and bladder mgt, patient and caregiver education,  Also needs BP control and will have dietician see for dietary education.- was walking 3 miles/day prior to his stroke.  . Goals have been set at Supervision. Anticipated discharge destination is home. ? ?Due to the current state of emergency, patients may not be receiving their 3 hours per day of Medicare-mandated therapy.  ? ? ? ? ? ?Goals supervision  ? ? ?See Team Conference Notes for weekly updates to the plan of care ? ?

## 2021-07-08 DIAGNOSIS — R3911 Hesitancy of micturition: Secondary | ICD-10-CM

## 2021-07-08 DIAGNOSIS — N401 Enlarged prostate with lower urinary tract symptoms: Secondary | ICD-10-CM

## 2021-07-08 DIAGNOSIS — I10 Essential (primary) hypertension: Secondary | ICD-10-CM

## 2021-07-08 NOTE — Progress Notes (Signed)
Occupational Therapy Session Note ? ?Patient Details  ?Name: Eddie Calhoun ?MRN: [id number] ?Date of Birth: Feb 28, 1951 ? ?Today's Date: 07/08/2021 ?OT Individual Time: 3888-2800 ?OT Individual Time Calculation (min): 45 min  ? ? ?Short Term Goals: ?Week 1:  OT Short Term Goal 1 (Week 1): Pt will perform ambulatory toilet transfer with CGA and RW ?OT Short Term Goal 2 (Week 1): Pt will perform shower level bathing CGA with AE PRN ?OT Short Term Goal 3 (Week 1): Pt will improve LUE to 3+/5 grossly ?OT Short Term Goal 4 (Week 1): Pt will perform 3/3 toileting tasks CGA ? ?Skilled Therapeutic Interventions/Progress Updates:  ?   ?Pt received in recliner agreeable to tx no pain ?ADL: ?Pt completes ADL at overall supervision-CGA Level. Skilled interventions include: VC fo rWBQC management throughout ADL, gathering and forced use of LUE to reach for items in dresser, wider BOS when squatting to low drawer to gather items. Pt bathes at shower level seated, dons clothing it to stand from [facility] in bathroom with S-CGA and grooms in standing with tactile cuing for even weight distribution over BLE. ? ?Therapeutic activity ?Pt assumes low lunge position 3x with MIN A and MAT infront of patient and foam pad under L knee to simulate scooping cat litter boxes. Pt with instabiltiy at ankle requiring physical facilitation to maintain foot in lunge position. Discussed with pt ankle neeeds more stability before doing this task at home ? ?Pt left at end of session in recliner with pt declining alarm belt stating, "I havent used that and I wont get up by myself" , call light in reach and all needs met ? ? ?Therapy Documentation ?Precautions:  ?Precautions ?Precautions: Fall ?Precaution Comments: L hemi, B STR with R>L sensitivity ?Restrictions ?Weight Bearing Restrictions: No ?General: ?  ?Other Treatments:   ? ? ?Therapy/Group: Individual Therapy ? ?Lowella Dell Arcenia Scarbro ?07/08/2021, 6:45 AM ?

## 2021-07-08 NOTE — Progress Notes (Signed)
Returned pt meds to pt wife.  ?

## 2021-07-08 NOTE — Progress Notes (Signed)
?                                                       PROGRESS NOTE ? ? ?Subjective/Complaints: ? ? ?Pt up in gym with PT. Progressing nicely. They're walking with rollator today ? ?ROS: Patient denies fever, rash, sore throat, blurred vision, dizziness, nausea, vomiting, diarrhea, cough, shortness of breath or chest pain, joint or back/neck pain, headache, or mood change.  ? ?Objective: ?  ?No results found. ?No results for input(s): WBC, HGB, HCT, PLT in the last 72 hours. ? ?No results for input(s): NA, K, CL, CO2, GLUCOSE, BUN, CREATININE, CALCIUM in the last 72 hours. ? ? ?Intake/Output Summary (Last 24 hours) at 07/08/2021 1143 ?Last data filed at 07/08/2021 0741 ?Gross per 24 hour  ?Intake 717 ml  ?Output 1800 ml  ?Net -1083 ml  ?  ? ?  ? ?Physical Exam: ?Vital Signs ?Blood pressure 121/80, pulse (!) 58, temperature 98 ?F (36.7 ?C), temperature source Oral, resp. rate 18, height '5\' 10"'$  (1.778 m), weight 90.9 kg, SpO2 97 %. ? ?Constitutional: No distress . Vital signs reviewed. ?HEENT: NCAT, EOMI, oral membranes moist ?Neck: supple ?Cardiovascular: RRR without murmur. No JVD    ?Respiratory/Chest: CTA Bilaterally without wheezes or rales. Normal effort    ?GI/Abdomen: BS +, non-tender, non-distended ?Ext: no clubbing, cyanosis, or edema ?Psych: pleasant and cooperative  ?Musculoskeletal:  ?   Cervical back: Neck supple. No tenderness.  ?     ?Skin: ?   Comments: IV's out ?L CTS surgery incision on L wrist healed- done 12/22 ?R CTS incision scabbed over done 1/23  ?Neurological:  ? RUE 5/5, LUE 4/5. LLE 4/5 prox to 3-/5 distally.  ?   Mental Status: He is oriented to person, place, and time.  ?   Sensory: No sensory deficit.  ?   Comments: Patient is alert.  Makes eye contact with examiner.  Oriented x3 and follows commands.  ambulates and struggles with clearance of left foot at times. With cueing he corrected.   ? ?Assessment/Plan: ?1. Functional deficits which require 3+ hours per day of interdisciplinary  therapy in a comprehensive inpatient rehab setting. ?Physiatrist is providing close team supervision and 24 hour management of active medical problems listed below. ?Physiatrist and rehab team continue to assess barriers to discharge/monitor patient progress toward functional and medical goals ? ?Care Tool: ? ?Bathing ?   ?Body parts bathed by patient: Right arm, Left arm, Chest, Abdomen, Front perineal area, Right upper leg, Left upper leg, Buttocks, Face  ? Body parts bathed by helper: Right lower leg, Left lower leg, Buttocks ?  ?  ?Bathing assist Assist Level: Contact Guard/Touching assist ?  ?  ?Upper Body Dressing/Undressing ?Upper body dressing   ?What is the patient wearing?: Pull over shirt ?   ?Upper body assist Assist Level: Supervision/Verbal cueing ?   ?Lower Body Dressing/Undressing ?Lower body dressing ? ? ?   ?What is the patient wearing?: Pants, Underwear/pull up ? ?  ? ?Lower body assist Assist for lower body dressing: Minimal Assistance - Patient > 75% ?   ? ?Toileting ?Toileting    ?Toileting assist Assist for toileting: Contact Guard/Touching assist ?  ?  ?Transfers ?Chair/bed transfer ? ?Transfers assist ?   ? ?Chair/bed transfer assist level: Contact Guard/Touching assist ?  ?  ?  Locomotion ?Ambulation ? ? ?Ambulation assist ? ?   ? ?Assist level: Minimal Assistance - Patient > 75% ?Assistive device: Walker-rolling ?Max distance: 154f  ? ?Walk 10 feet activity ? ? ?Assist ?   ? ?Assist level: Minimal Assistance - Patient > 75% ?Assistive device: Walker-rolling  ? ?Walk 50 feet activity ? ? ?Assist   ? ?Assist level: Minimal Assistance - Patient > 75% ?Assistive device: Walker-rolling  ? ? ?Walk 150 feet activity ? ? ?Assist   ? ?Assist level: Minimal Assistance - Patient > 75% ?Assistive device: Walker-rolling ?  ? ?Walk 10 feet on uneven surface  ?activity ? ? ?Assist Walk 10 feet on uneven surfaces activity did not occur: Safety/medical concerns ? ? ?  ?   ? ?Wheelchair ? ? ? ? ?Assist Is  the patient using a wheelchair?: Yes ?Type of Wheelchair: Manual ?  ? ?Wheelchair assist level: Dependent - Patient 0% ?   ? ? ?Wheelchair 50 feet with 2 turns activity ? ? ? ?Assist ? ?  ?  ? ? ?Assist Level: Dependent - Patient 0%  ? ?Wheelchair 150 feet activity  ? ? ? ?Assist ?   ? ? ?Assist Level: Dependent - Patient 0%  ? ?Blood pressure 121/80, pulse (!) 58, temperature 98 ?F (36.7 ?C), temperature source Oral, resp. rate 18, height '5\' 10"'$  (1.778 m), weight 90.9 kg, SpO2 97 %. ? ?Medical Problem List and Plan: ?1. 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  Status post IV TNKase ?            -patient may  shower ?            -ELOS/Goals: 10-12 days mod I to supervision ? -Continue CIR therapies including PT, OT  ?2.  Antithrombotics: ?-DVT/anticoagulation:  Mechanical:  Antiembolism stockings, knee (TED hose) Bilateral lower extremities ?            -antiplatelet therapy: Aspirin 81 mg daily and Plavix 75 mg daily ?3. Pain Management: Tylenol as needed ? 3/2- denies pain- con't regimen ?4. Mood: Provide emotional support ?            -antipsychotic agents: N/A ?5. Neuropsych: This patient is capable of making decisions on his own behalf. ?6. Skin/Wound Care: Routine skin checks ?7. Fluids/Electrolytes/Nutrition: Routine in and outs with follow-up chemistries ? 3/1- looking great- reviewed independently;- con't to monitor weekly.  ?8.  Hypertension.  Avapro resumed at 300 mg daily.  Monitor with increased mobility ? 3/1- BP controlled this AM and bradycardic due to fitness level- con't regimen ? 3/2- Avapro isn't on order list- hasn't received and BP is actually perfectly controlled- don't want to restart at 300 mg daily- will do 75 mg daily- since could cause his BP to drop too low- and that could put him at risk for another stroke. Educated pt /wife on this.  ? 3/3- BP running [id number]- will start  Avapro 75 mg daily this AM- will monitor closely.  ? 3/4 bp controlled ?9.  BPH.  Flomax 0.4 mg daily.  Check PVR ? 3/2- having urgency- however don't want to stop since has significant BPH per wife; also don't want to change to another BPH med- could drop BP too much.  ?10.  History of chronic thrombocytopenia.  Platelets 120,000-154000 at baseline.  Follow-up CBC ? 3/1- Plts 99k- was 102 yesterday- will monitor since on Lovenox ? 3/3- labs MOnday ?11.  Hyperlipidemia.  Crestor ?12.  Recent bilateral carpal tunnel surgeries.  Follow-up outpatient- can use gloves to protect wrists/hands on walker/assistive device ?13. Dispo ? 3/3- pt asking for dietician for d/w pt/wife about dietary changes.  ? ?  ? ? ? ? ?LOS: ?4 days ?A FACE TO FACE EVALUATION WAS PERFORMED ? ?Meredith Staggers ?07/08/2021, 11:43 AM  ? ? ? ?

## 2021-07-08 NOTE — Progress Notes (Signed)
Physical Therapy Session Note ? ?Patient Details  ?Name: Eddie Calhoun ?MRN: [id number] ?Date of Birth: 03/18/1951 ? ?Today's Date: 07/08/2021 ?PT Individual Time: 5643-3295 ?PT Individual Time Calculation (min): 70 min  ? ?Short Term Goals: ?Week 1:  PT Short Term Goal 1 (Week 1): Pt will navigate stairs with 1 hand rail ?PT Short Term Goal 2 (Week 1): Pt will ambulate with RW and CGA ?PT Short Term Goal 3 (Week 1): Pt will perform STS with supevision consistently ?   ? ?Skilled Therapeutic Interventions/Progress Updates:  ? ?Pt seated in recliner; he denied pain, and was eager to start tx.  Pt reported that he needed to urinate.  Sit> stand with close supervision. Gait in room with QC x 20' with min assist.  Toilet transfer with CGA and use of railing.  Pt stood to urinate.  He managed clothing with CGA. Hand washing at sink with supervision. ? ?neuromuscular re-education via demo, visual feedback, forced use and multimodal cues for standing: mini squats with adductor squeezes, bil scapular retraction, R heel/toe raises with bil UE support.  Use of Kinetron in sitting at resistance 40 cm/sec x 30 cycles focusing on neutral hip rotation as pt has significant wobble due to hip weakness; in standing with bil UE> RUE only support, focusing on full L wt shifting and L knee stance control using mirror.Wc propulsion using bil UEs x 100' with supervision, with self correction for veering L due to LUE weakness; with LEs only (+ACE L foot) x 25' with mod assist due to L hamstring weakness. ? ?Self stretching L heel cords and hamstrings in sitting using footstool and strap, x 30 seconds x 3.  ? ?Gait training with QC on level tile x 150 feet with multiple L turns, ACE on LLE for foot drop, min assist.  Pt's gait velocity is quick, per habit; he required mod cues to slow down.  With fatigue, he had poor clearance of L foot, and increased hip instability. ? ?At end of session, pt handed off to [place], PT for making up  time. ? ? ?   ? ?Therapy Documentation ?Precautions:  ?Precautions ?Precautions: Fall ?Precaution Comments: L hemi, B STR with R>L sensitivity ?Restrictions ?Weight Bearing Restrictions: No ?  ? ? ? ?Therapy/Group: Individual Therapy ? ?Sabryn Preslar ?07/08/2021, 5:06 PM  ?

## 2021-07-08 NOTE — Progress Notes (Signed)
Physical Therapy Session Note ? ?Patient Details  ?Name: Eddie Calhoun ?MRN: [id number] ?Date of Birth: [birth year] ? ?Today's Date: 07/08/2021 ?PT Individual Time: 2876-8115 ?PT Individual Time Calculation (min): 57 min  ? ?Short Term Goals: ?Week 1:  PT Short Term Goal 1 (Week 1): Pt will navigate stairs with 1 hand rail ?PT Short Term Goal 2 (Week 1): Pt will ambulate with RW and CGA ?PT Short Term Goal 3 (Week 1): Pt will perform STS with supevision consistently ? ?Skilled Therapeutic Interventions/Progress Updates:  ?  Pt seated in w/c and extremely excited about therapy, No complaint of pain. Pt transported to therapy gym for time management and energy conservation. Stand pivot transfer to mat table without AD, min A for balance. Pt performed squats to tap table without UE support 4 x 6. Added visual feed back and RTB pulling hips toward R side to improve proprioception for even weightbearing and knee control on LLE. ? ?Pt requested to trial rollator as he is considering buying one. Gait trials up to 120 ft with knee hyperextension at times, narrow BOS, foot drop, and steppage gait pattern to compensate. Pt able to control knee extension with focus. Trialled RW, rollator, and quad cane. Pt and therapist agree to work toward quad cane for least restrictive option, but might need RW at this time, especially for long distances. Pt returned to room and ended session in recliner, was left with all needs in reach and alarm active.  ? ?Therapy Documentation ?Precautions:  ?Precautions ?Precautions: Fall ?Precaution Comments: L hemi, B STR with R>L sensitivity ?Restrictions ?Weight Bearing Restrictions: No ?General: ?  ? ? ? ?Therapy/Group: Individual Therapy ? ?[place] ?07/08/2021, 9:58 AM  ?

## 2021-07-08 NOTE — Progress Notes (Signed)
Physical Therapy Session Note ? ?Patient Details  ?Name: Eddie Calhoun ?MRN: [id number] ?Date of Birth: 1951-01-12 ? ?Today's Date: 07/08/2021 ?PT Individual Time: 0092-3300 ?PT Individual Time Calculation (min): 33 min  ? ?Short Term Goals: ?Week 1:  PT Short Term Goal 1 (Week 1): Pt will navigate stairs with 1 hand rail ?PT Short Term Goal 2 (Week 1): Pt will ambulate with RW and CGA ?PT Short Term Goal 3 (Week 1): Pt will perform STS with supevision consistently ? ?Skilled Therapeutic Interventions/Progress Updates:  ?  Pt recd in w/c at close of previous PT session for unscheduled time, but agreeable to extra time. Pt ambulated to/from day room >100 ft with quad cane and no DF assist wrap, min A. Pt with lateral hip instability and continued hyperextension, but improved with incr attention/cueing. Pt then performed standing activity with toes on wedge for ankle strategy challenge and then tapping CL foot onto top of wedge. Pt demoes improving awareness of hyperextension but still with difficulty controlling knee motion in stance, improved with repetition. Pt returned to room and to recliner. Pt's wife present, who asked about progress and expressed feeling overwhelmed by the emotional toll of having a loved one ill. Provided emotional support as able. Pt remained seated in recliner with his wife present and was left with all needs in reach and alarm active.  ? ?Therapy Documentation ?Precautions:  ?Precautions ?Precautions: Fall ?Precaution Comments: L hemi, B STR with R>L sensitivity ?Restrictions ?Weight Bearing Restrictions: No ?General: ?  ? ? ? ?Therapy/Group: Individual Therapy ? ?[place] ?07/08/2021, 3:45 PM  ?

## 2021-07-09 NOTE — Progress Notes (Signed)
Physical Therapy Session Note ? ?Patient Details  ?Name: Eddie Calhoun ?MRN: [id number] ?Date of Birth: 09-24-1950 ? ?Today's Date: 07/09/2021 ?PT Individual Time: 3818-2993 ?PT Individual Time Calculation (min): 70 min  ? ?Short Term Goals: ?Week 1:  PT Short Term Goal 1 (Week 1): Pt will navigate stairs with 1 hand rail ?PT Short Term Goal 2 (Week 1): Pt will ambulate with RW and CGA ?PT Short Term Goal 3 (Week 1): Pt will perform STS with supevision consistently ? ?Skilled Therapeutic Interventions/Progress Updates:  ?  Pt seen for makeup therapy session. Pt received seated in recliner in room, agreeable to PT session. Pt reports a bruise on his L lower leg that nursing is aware of, no other complaints of pain. Sit to stand with CGA with and without QC during session. Ambulation up to 150 ft with use of quad cane with CGA for balance. Use of DF assist ACE wrap for improved LE clearance during gait. No instances of L knee hyperextension noted during gait although limb is occasionally ataxic. Sidesteps L/R in // bars with Supervision for balance, focus on keeping toes facing forward and head up. Squats 3 x 15 reps with no UE support and Supervision with focus on keeping weight over heels and equal WB through BLE to maintain midline. Ambulation through agility ladder with forward step to with QC and CGA for balance with focus on increasing LLE step length during gait. Quadruped on mat table with focus on alt UE or LE lifts with CGA for balance, x 5 reps each before onset of fatigue and rest break required. Pt returned to room and left seated in recliner with needs in reach at end of session. ? ?Therapy Documentation ?Precautions:  ?Precautions ?Precautions: Fall ?Precaution Comments: L hemi, B STR with R>L sensitivity ?Restrictions ?Weight Bearing Restrictions: No ? ? ? ? ? ?Therapy/Group: Individual Therapy ? ? ?Excell Seltzer, PT, DPT, CSRS ? ?07/09/2021, 4:41 PM  ?

## 2021-07-10 LAB — COMPREHENSIVE METABOLIC PANEL
ALT: 19 U/L (ref 0–44)
AST: 19 U/L (ref 15–41)
Albumin: 3.4 g/dL — ABNORMAL LOW (ref 3.5–5.0)
Alkaline Phosphatase: 53 U/L (ref 38–126)
Anion gap: 9 (ref 5–15)
BUN: 21 mg/dL (ref 8–23)
CO2: 23 mmol/L (ref 22–32)
Calcium: 8.8 mg/dL — ABNORMAL LOW (ref 8.9–10.3)
Chloride: 105 mmol/L (ref 98–111)
Creatinine, Ser: 1.28 mg/dL — ABNORMAL HIGH (ref 0.61–1.24)
GFR, Estimated: >60 mL/min (ref >60)
Glucose, Bld: 95 mg/dL (ref 70–99)
Potassium: 4.1 mmol/L (ref 3.5–5.1)
Sodium: 137 mmol/L (ref 135–145)
Total Bilirubin: 0.7 mg/dL (ref 0.3–1.2)
Total Protein: 6.3 g/dL — ABNORMAL LOW (ref 6.5–8.1)

## 2021-07-10 LAB — CBC WITH DIFFERENTIAL/PLATELET
Abs Immature Granulocytes: 0 10*3/uL (ref 0.00–0.07)
Basophils Absolute: 0 10*3/uL (ref 0.0–0.1)
Basophils Relative: 0 %
Eosinophils Absolute: 0.2 10*3/uL (ref 0.0–0.5)
Eosinophils Relative: 4 %
HCT: 35.7 % — ABNORMAL LOW (ref 39.0–52.0)
Hemoglobin: 12.8 g/dL — ABNORMAL LOW (ref 13.0–17.0)
Immature Granulocytes: 0 %
Lymphocytes Relative: 20 %
Lymphs Abs: 0.9 10*3/uL (ref 0.7–4.0)
MCH: 34.1 pg — ABNORMAL HIGH (ref 26.0–34.0)
MCHC: 35.9 g/dL (ref 30.0–36.0)
MCV: 95.2 fL (ref 80.0–100.0)
Monocytes Absolute: 0.4 10*3/uL (ref 0.1–1.0)
Monocytes Relative: 10 %
Neutro Abs: 2.9 10*3/uL (ref 1.7–7.7)
Neutrophils Relative %: 66 %
Platelets: 104 10*3/uL — ABNORMAL LOW (ref 150–400)
RBC: 3.75 MIL/uL — ABNORMAL LOW (ref 4.22–5.81)
RDW: 12.6 % (ref 11.5–15.5)
WBC: 4.4 10*3/uL (ref 4.0–10.5)
nRBC: 0 % (ref 0.0–0.2)

## 2021-07-10 NOTE — Progress Notes (Signed)
Physical Therapy Session Note ? ?Patient Details  ?Name: Eddie Calhoun ?MRN: [id number] ?Date of Birth: 1950/09/21 ? ?Today's Date: 07/10/2021 ?PT Individual Time: 1031-1101 ?PT Individual Time Calculation (min): 30 min  ? ?Short Term Goals: ?Week 1:  PT Short Term Goal 1 (Week 1): Pt will navigate stairs with 1 hand rail ?PT Short Term Goal 2 (Week 1): Pt will ambulate with RW and CGA ?PT Short Term Goal 3 (Week 1): Pt will perform STS with supevision consistently ? ?Skilled Therapeutic Interventions/Progress Updates:  ?Patient seated upright in recliner on entrance to room. Patient alert and agreeable to PT session. Requests to toilet prior to leaving room. ? ?Patient with no pain complaint throughout session. ? ?Therapeutic Activity: ?Transfers: Patient performed sit<>stand and stand pivot transfers throughout session with supervision and close guard to L knee for uncontrolled hyperextension. Provided verbal cues for"soft but strong" L knee during stance. ? ?Gait Training:  ?Patient ambulated 150 ft using LBQC with CGA for L knee buckling. Pt is able to produce slight flexion throughout with minimal examples of uncontrolled hyperextension. Demonstrated improving quality of gait. Provided vc/ tc for L knee stability and level gaze with upright posture. ? ?Neuromuscular Re-ed: ?NMR facilitated during session with focus on standing balance, coordination, and muscle activation. Pt guided in forward ambulation using LBQC and lifting LLE for toe touch to 10 successive cones placed to left of path of travel. When cued for taking time to think re: weight shift to R, followed by conscious visualization of planned movement, pt's time to "zero-in" on target improves with smother movement pattern and smaller oscillations noted. Pt then guided in lateral stepping  to R using LBQC with toe taps to cone using L foot. Then return lateral stepping to L requiring multiple steps to position and then toe touches with R foot. Pt cued  for "soft knee" while standing on LLE with pt able to hold 7/10 in slight flexion with no uncontrolled hyperextension of knee. NMR performed for improvements in motor control and coordination, balance, sequencing, judgement, and self confidence/ efficacy in performing all aspects of mobility at highest level of independence.  ? ?Patient seated upright in recliner at end of session with brakes locked, no alarm set, and all needs within reach. Relates understanding of waiting for staff prior to ambulation to bathroom.  ?  ? ?Therapy Documentation ?Precautions:  ?Precautions ?Precautions: Fall ?Precaution Comments: L hemi, B STR with R>L sensitivity ?Restrictions ?Weight Bearing Restrictions: No ?General: ?  ?Vital Signs: ? ?Pain: ? Pt with no pain complaint throughout session.  ? ?Therapy/Group: Individual Therapy ? ?Alger Simons PT, DPT ? ?07/10/2021, 10:32 AM  ?

## 2021-07-10 NOTE — Progress Notes (Signed)
Occupational Therapy Session Note ? ?Patient Details  ?Name: Eddie Calhoun ?MRN: [id number] ?Date of Birth: [birth year] ? ?Today's Date: 07/10/2021 ?OT Individual Time: 1300-1400 ?OT Individual Time Calculation (min): 60 min  ? ? ?Short Term Goals: ?Week 1:  OT Short Term Goal 1 (Week 1): Pt will perform ambulatory toilet transfer with CGA and RW ?OT Short Term Goal 2 (Week 1): Pt will perform shower level bathing CGA with AE PRN ?OT Short Term Goal 3 (Week 1): Pt will improve LUE to 3+/5 grossly ?OT Short Term Goal 4 (Week 1): Pt will perform 3/3 toileting tasks CGA ? ? ?Skilled Therapeutic Interventions/Progress Updates:  ?  Pt greeted at time of session up in recliner with wife [name] present who did not remain for session. Discussion with pt regarding performing shower and ADL routine tomorrow, deferred today. Pt ambulating with quad cane from room <> main gym with CGA/supervision and ACE wrap already provided for DF from PT in early session. Grip strength reassessed with R hand at 65# and L hand at 43# which is an improvement. Pt performing 1x20 of the following at rebounder with 2kg weighted ball: toss, toss + overhead, toss + chest press, toss while standing on airex pad. 1x10 squats on airex pad as well for additional standing balance activity. Pt performing 1x15 wood choppers in PNF patter in standing crossing midline in standing, no LOB. Pt expressing desire to be able to play pickle ball and throw a ball, provided 2lb weighted small ball with muliple overhead and underhanded throws to improve accuracy and proprioception. 1x10 sit ups seated > reclined wedge > stands with overhead press all to improve core, balance, and body awareness. Ambulated back to room same as above, alarm on call bell in reach.  ? ?Therapy Documentation ?Precautions:  ?Precautions ?Precautions: Fall ?Precaution Comments: L hemi, B STR with R>L sensitivity ?Restrictions ?Weight Bearing Restrictions: No ? ? ? ? ?Therapy/Group: Individual  Therapy ? ?Viona Gilmore ?07/10/2021, 12:53 PM ?

## 2021-07-10 NOTE — Progress Notes (Signed)
?                                                       PROGRESS NOTE ? ? ?Subjective/Complaints: ? ?Went outside yesterday- was really excited due to good weather.  ?Has therapeutic pass.  ?Has been limited on carbs, for some reason.  ? ?Irritation L lateral calf- doesn't know why.  ? ?Has stiffness in L hand-  ?ROS:  ?Pt denies SOB, abd pain, CP, N/V/C/D, and vision changes  ? ?Objective: ?  ?No results found. ?Recent Labs  ?  07/10/21 ?7017  ?WBC 4.4  ?HGB 12.8*  ?HCT 35.7*  ?PLT 104*  ? ? ?Recent Labs  ?  07/10/21 ?7939  ?NA 137  ?K 4.1  ?CL 105  ?CO2 23  ?GLUCOSE 95  ?BUN 21  ?CREATININE 1.28*  ?CALCIUM 8.8*  ? ? ? ?Intake/Output Summary (Last 24 hours) at 07/10/2021 0915 ?Last data filed at 07/10/2021 0300 ?Gross per 24 hour  ?Intake 1200 ml  ?Output 1175 ml  ?Net 25 ml  ?  ? ?  ? ?Physical Exam: ?Vital Signs ?Blood pressure 120/77, pulse (!) 52, temperature 98.1 ?F (36.7 ?C), resp. rate 18, height '5\' 10"'$  (1.778 m), weight 90.9 kg, SpO2 98 %. ? ? ? ?General: awake, alert, appropriate, sitting up in w/c; nurse in room; hand under hot water; due to stiffness;  NAD ?HENT: conjugate gaze; oropharynx moist ?CV: regular rhythm. Bradycardic  rate; no JVD ?Pulmonary: CTA B/L; no W/R/R- good air movement ?GI: soft, NT, ND, (+)BS ?Psychiatric: appropriate ?Neurological: Ox3-talkative;  ? ?Ext: no clubbing, cyanosis, or edema ?Psych: pleasant and cooperative  ?Musculoskeletal:  ?   Cervical back: Neck supple. No tenderness.  ?     ?Skin: ?   Comments: IV's out ?L CTS surgery incision on L wrist healed- done 12/22 ?R CTS incision scabbed over done 1/23  ?Neurological:  ? RUE 5/5, LUE 4/5. LLE 4/5 prox to 3-/5 distally.  ?   Mental Status: He is oriented to person, place, and time.  ?   Sensory: No sensory deficit.  ?   Comments: Patient is alert.  Makes eye contact with examiner.  Oriented x3 and follows commands.  ambulates and struggles with clearance of left foot at times. With cueing he corrected.    ? ?Assessment/Plan: ?1. Functional deficits which require 3+ hours per day of interdisciplinary therapy in a comprehensive inpatient rehab setting. ?Physiatrist is providing close team supervision and 24 hour management of active medical problems listed below. ?Physiatrist and rehab team continue to assess barriers to discharge/monitor patient progress toward functional and medical goals ? ?Care Tool: ? ?Bathing ?   ?Body parts bathed by patient: Right arm, Left arm, Chest, Abdomen, Front perineal area, Right upper leg, Left upper leg, Buttocks, Face  ? Body parts bathed by helper: Right lower leg, Left lower leg, Buttocks ?  ?  ?Bathing assist Assist Level: Contact Guard/Touching assist ?  ?  ?Upper Body Dressing/Undressing ?Upper body dressing   ?What is the patient wearing?: Pull over shirt ?   ?Upper body assist Assist Level: Supervision/Verbal cueing ?   ?Lower Body Dressing/Undressing ?Lower body dressing ? ? ?   ?What is the patient wearing?: Pants, Underwear/pull up ? ?  ? ?Lower body assist Assist for lower body dressing: Minimal Assistance -  Patient > 75% ?   ? ?Toileting ?Toileting    ?Toileting assist Assist for toileting: Contact Guard/Touching assist ?  ?  ?Transfers ?Chair/bed transfer ? ?Transfers assist ?   ? ?Chair/bed transfer assist level: Contact Guard/Touching assist ?  ?  ?Locomotion ?Ambulation ? ? ?Ambulation assist ? ?   ? ?Assist level: Minimal Assistance - Patient > 75% ?Assistive device: Cane-quad ?Max distance: 150  ? ?Walk 10 feet activity ? ? ?Assist ?   ? ?Assist level: Minimal Assistance - Patient > 75% ?Assistive device: Cane-quad  ? ?Walk 50 feet activity ? ? ?Assist   ? ?Assist level: Minimal Assistance - Patient > 75% ?Assistive device: Cane-quad  ? ? ?Walk 150 feet activity ? ? ?Assist   ? ?Assist level: Minimal Assistance - Patient > 75% ?Assistive device: Cane-quad ?  ? ?Walk 10 feet on uneven surface  ?activity ? ? ?Assist Walk 10 feet on uneven surfaces activity did not  occur: Safety/medical concerns ? ? ?  ?   ? ?Wheelchair ? ? ? ? ?Assist Is the patient using a wheelchair?: Yes ?Type of Wheelchair: Manual ?  ? ?Wheelchair assist level: Supervision/Verbal cueing ?Max wheelchair distance: 100  ? ? ?Wheelchair 50 feet with 2 turns activity ? ? ? ?Assist ? ?  ?  ? ? ?Assist Level: Supervision/Verbal cueing  ? ?Wheelchair 150 feet activity  ? ? ? ?Assist ?   ? ? ?Assist Level: Dependent - Patient 0%  ? ?Blood pressure 120/77, pulse (!) 52, temperature 98.1 ?F (36.7 ?C), resp. rate 18, height '5\' 10"'$  (1.778 m), weight 90.9 kg, SpO2 98 %. ? ?Medical Problem List and Plan: ?1. 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  Status post IV TNKase ?            -patient may  shower ?            -ELOS/Goals: 10-12 days mod I to supervision ? -con't CIR- PT and OT ?2.  Antithrombotics: ?-DVT/anticoagulation:  Mechanical:  Antiembolism stockings, knee (TED hose) Bilateral lower extremities ?3/6- will try and start Lovenox- will check with team ?            -antiplatelet therapy: Aspirin 81 mg daily and Plavix 75 mg daily ?3. Pain Management: Tylenol as needed ? 3/2- denies pain- con't regimen ?4. Mood: Provide emotional support ?            -antipsychotic agents: N/A ?5. Neuropsych: This patient is capable of making decisions on his own behalf. ?6. Skin/Wound Care: Routine skin checks ?7. Fluids/Electrolytes/Nutrition: Routine in and outs with follow-up chemistries ? 3/1- looking great- reviewed independently;- con't to monitor weekly.  ? 3/6- electrolytes look good.  ?8.  Hypertension.  Avapro resumed at 300 mg daily.  Monitor with increased mobility ? 3/1- BP controlled this AM and bradycardic due to fitness level- con't regimen ? 3/2- Avapro isn't on order list- hasn't received and BP is actually perfectly controlled- don't want to restart at 300 mg daily- will do 75 mg daily- since could cause his  BP to drop too low- and that could put him at risk for another stroke. Educated pt /wife on this.  ? 3/3- BP running 371I-[id number] systolic- will start Avapro 75 mg daily this AM- will monitor closely.  ? 3/6- BP well controlled- con't regien ?9.  BPH.  Flomax 0.4 mg daily.  Check PVR ? 3/2- having urgency-  however don't want to stop since has significant BPH per wife; also don't want to change to another BPH med- could drop BP too much.  ?10.  History of chronic thrombocytopenia.  Platelets 120,000-154000 at baseline.  Follow-up CBC ? 3/1- Plts 99k- was 102 yesterday- will monitor since on Lovenox ? 3/3- labs Monday ? 3/6- Plts 104k- stable -con't regimen ?11.  Hyperlipidemia.  Crestor ?12.  Recent bilateral carpal tunnel surgeries.  Follow-up outpatient- can use gloves to protect wrists/hands on walker/assistive device ?13. Dispo ? 3/3- pt asking for dietician for d/w pt/wife about dietary changes.  ? 3/6- change diet to heart healthy and get rid of carb modified - pt also can have pass to go outside ot see family.  ?14. AKI ? 3/6- Cr 1.28- up from 1.11- will recheck Thursday.  ? ? ?I spent a total of 37   minutes on total care today- >50% coordination of care- due to  d/w nursing about care and prolonged pt time.  ? ? ?  ? ? ? ? ?LOS: ?6 days ?A FACE TO FACE EVALUATION WAS PERFORMED ? ?Eddie Calhoun ?07/10/2021, 9:15 AM  ? ? ? ?

## 2021-07-10 NOTE — Progress Notes (Signed)
Physical Therapy Session Note ? ?Patient Details  ?Name: Eddie Calhoun ?MRN: [id number] ?Date of Birth: 10-01-1950 ? ?Today's Date: 07/10/2021 ?PT Individual Time: 7673-4193, 7902-4097 ?PT Individual Time Calculation (min): 75 min, 29 min  ? ?Short Term Goals: ?Week 1:  PT Short Term Goal 1 (Week 1): Pt will navigate stairs with 1 hand rail ?PT Short Term Goal 2 (Week 1): Pt will ambulate with RW and CGA ?PT Short Term Goal 3 (Week 1): Pt will perform STS with supevision consistently ? ?Skilled Therapeutic Interventions/Progress Updates:  ?  Session 1: ?Pt recd in w/c and No complaint of pain. Session focused on gait training with quad cane and DF assist wrap. Sit to stand with supervision throughout, donned ace wrap with tot A. Pt ambulated with quad cane with min A for balance. Demoed improved hyperextension and lateral hip stability at this time. Rest of session spent using lite gait on treadmill for improved stability during gait training. Pt walked a total of 2,002 ft, 21 min, .29 mi. Individual bouts as follows: ?297 ft, .5-1.84mh  ?211 ft, 0.769m with YTB pulling posterior at ankle to increase quad activation ?489 ft, 1.2 mph, with YTB as above ?1,005 ft, 1.4 mph, with cues for symmetry in step length and heel strike.  ?Seated rest breaks between bouts. Pt continues to demo ataxic L side with worsens with fatigue. Also minimal knee hyperextension until very fatigued during last bout. Pt with most difficulty with timing/sequencing of hamstring for adequate knee flexion and quad activation for heel strike. Pt ambulated back to room, min A d/t fatigue. Pt requested to use bathroom, did so in standing with supervision, handed off to NT who was present in room. ? ? ?Session 2: ?Pt received in recliner and agreeable to therapy.  No complaint of pain. Pt ambulated to/from gym with CGA and quad cane, DF assist wrap in place. No knee hyperextension  noted but LLE still ataxic at times. Session focused on pt stated goal  of being able to get into half kneeling to help care for his cats and floor transfers. Pt performed stand<>half kneel x 4 with min A for balance, using mat table and quad cane for balance. During one rep, practiced maintaining position, reaching, and shifting anterior/posterior for L hip NMR in weightbearing position. Transitioned to floor transfer. Provided education on assessing for injury, calling for help, activating EMS if needed. Pt then performed floor transfer with CGA. Pt returned to room as written above and was left with all needs in reach and alarm active.  ? ?Therapy Documentation ?Precautions:  ?Precautions ?Precautions: Fall ?Precaution Comments: L hemi, B STR with R>L sensitivity ?Restrictions ?Weight Bearing Restrictions: No ?General: ?  ? ? ?Therapy/Group: Individual Therapy ? ?OlShoals3/10/2021, 10:13 AM  ?

## 2021-07-11 NOTE — Progress Notes (Signed)
?                                                       PROGRESS NOTE ? ? ?Subjective/Complaints: ? ?Pt reports still having urinary urgency- but no other Sx's of UTI.  ?Going every 1-2 hours while awake.  ?LBM yesterday- goes daily.  ? ?Discussed risks/benefits of lovenox- pt feels he's walking far enough to avoid taking lovenox.  ?  ?ROS:  ? ?Pt denies SOB, abd pain, CP, N/V/C/D, and vision changes ? ? ?Objective: ?  ?No results found. ?Recent Labs  ?  07/10/21 ?9379  ?WBC 4.4  ?HGB 12.8*  ?HCT 35.7*  ?PLT 104*  ? ? ?Recent Labs  ?  07/10/21 ?0240  ?NA 137  ?K 4.1  ?CL 105  ?CO2 23  ?GLUCOSE 95  ?BUN 21  ?CREATININE 1.28*  ?CALCIUM 8.8*  ? ? ? ?Intake/Output Summary (Last 24 hours) at 07/11/2021 0813 ?Last data filed at 07/11/2021 0700 ?Gross per 24 hour  ?Intake 716 ml  ?Output 900 ml  ?Net -184 ml  ?  ? ?  ? ?Physical Exam: ?Vital Signs ?Blood pressure 115/73, pulse (!) 55, temperature 98.5 ?F (36.9 ?C), resp. rate 17, height '5\' 10"'$  (1.778 m), weight 90.9 kg, SpO2 97 %. ? ? ? ? ?General: awake, alert, appropriate, sitting up in w/c at bedside;  NAD ?HENT: conjugate gaze; oropharynx moist ?CV: regular rhythm; bradycardic rate; no JVD ?Pulmonary: CTA B/L; no W/R/R- good air movement ?GI: soft, NT, ND, (+)BS ?Psychiatric: appropriate- interactive ?Neurological: Ox3 ? ?Ext: no clubbing, cyanosis, or edema ?Psych: pleasant and cooperative  ?Musculoskeletal:  ?   Cervical back: Neck supple. No tenderness.  ?     ?Skin: ?   Comments: IV's out ?L CTS surgery incision on L wrist healed- done 12/22 ?R CTS incision scabbed over done 1/23  ?Neurological:  ? RUE 5/5, LUE 4/5. LLE 4/5 prox to 3-/5 distally.  ?   Mental Status: He is oriented to person, place, and time.  ?   Sensory: No sensory deficit.  ?   Comments: Patient is alert.  Makes eye contact with examiner.  Oriented x3 and follows commands.  ambulates and struggles with clearance of left foot at times. With cueing he corrected.   ? ?Assessment/Plan: ?1. Functional  deficits which require 3+ hours per day of interdisciplinary therapy in a comprehensive inpatient rehab setting. ?Physiatrist is providing close team supervision and 24 hour management of active medical problems listed below. ?Physiatrist and rehab team continue to assess barriers to discharge/monitor patient progress toward functional and medical goals ? ?Care Tool: ? ?Bathing ?   ?Body parts bathed by patient: Right arm, Left arm, Chest, Abdomen, Front perineal area, Right upper leg, Left upper leg, Buttocks, Face  ? Body parts bathed by helper: Right lower leg, Left lower leg, Buttocks ?  ?  ?Bathing assist Assist Level: Contact Guard/Touching assist ?  ?  ?Upper Body Dressing/Undressing ?Upper body dressing   ?What is the patient wearing?: Pull over shirt ?   ?Upper body assist Assist Level: Supervision/Verbal cueing ?   ?Lower Body Dressing/Undressing ?Lower body dressing ? ? ?   ?What is the patient wearing?: Pants, Underwear/pull up ? ?  ? ?Lower body assist Assist for lower body dressing: Minimal Assistance - Patient > 75% ?   ? ?  Toileting ?Toileting    ?Toileting assist Assist for toileting: Contact Guard/Touching assist ?  ?  ?Transfers ?Chair/bed transfer ? ?Transfers assist ?   ? ?Chair/bed transfer assist level: Contact Guard/Touching assist ?  ?  ?Locomotion ?Ambulation ? ? ?Ambulation assist ? ?   ? ?Assist level: Minimal Assistance - Patient > 75% ?Assistive device: Cane-quad ?Max distance: 150  ? ?Walk 10 feet activity ? ? ?Assist ?   ? ?Assist level: Minimal Assistance - Patient > 75% ?Assistive device: Cane-quad  ? ?Walk 50 feet activity ? ? ?Assist   ? ?Assist level: Minimal Assistance - Patient > 75% ?Assistive device: Cane-quad  ? ? ?Walk 150 feet activity ? ? ?Assist   ? ?Assist level: Minimal Assistance - Patient > 75% ?Assistive device: Cane-quad ?  ? ?Walk 10 feet on uneven surface  ?activity ? ? ?Assist Walk 10 feet on uneven surfaces activity did not occur: Safety/medical concerns ? ? ?   ?   ? ?Wheelchair ? ? ? ? ?Assist Is the patient using a wheelchair?: Yes ?Type of Wheelchair: Manual ?  ? ?Wheelchair assist level: Supervision/Verbal cueing ?Max wheelchair distance: 100  ? ? ?Wheelchair 50 feet with 2 turns activity ? ? ? ?Assist ? ?  ?  ? ? ?Assist Level: Supervision/Verbal cueing  ? ?Wheelchair 150 feet activity  ? ? ? ?Assist ?   ? ? ?Assist Level: Dependent - Patient 0%  ? ?Blood pressure 115/73, pulse (!) 55, temperature 98.5 ?F (36.9 ?C), resp. rate 17, height '5\' 10"'$  (1.778 m), weight 90.9 kg, SpO2 97 %. ? ?Medical Problem List and Plan: ?1. 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  Status post IV TNKase ?            -patient may  shower ?            -ELOS/Goals: 10-12 days mod I to supervision ? Team conference today to determine length of stay ? Con't CIR- PT and OT- discussed possible Neurorehab vs closer to his home. Outpt therapies.  ?2.  Antithrombotics: ?-DVT/anticoagulation:  Mechanical:  Antiembolism stockings, knee (TED hose) Bilateral lower extremities ?3/7- went over risks/benefits of lovenox- pt doesn't want to start0 feels he's walking far enough- walked 0.37 miles on treadmill yesterday.  ?            -antiplatelet therapy: Aspirin 81 mg daily and Plavix 75 mg daily ?3. Pain Management: Tylenol as needed ? 3/7- denies pain- con't regimen ?4. Mood: Provide emotional support ?            -antipsychotic agents: N/A ?5. Neuropsych: This patient is capable of making decisions on his own behalf. ?6. Skin/Wound Care: Routine skin checks ?7. Fluids/Electrolytes/Nutrition: Routine in and outs with follow-up chemistries ? 3/1- looking great- reviewed independently;- con't to monitor weekly.  ? 3/6- electrolytes look good.  ?8.  Hypertension.  Avapro resumed at 300 mg daily.  Monitor with increased mobility ? 3/1- BP controlled this AM and bradycardic due to fitness level- con't  regimen ? 3/2- Avapro isn't on order list- hasn't received and BP is actually perfectly controlled- don't want to restart at 300 mg daily- will do 75 mg daily- since could cause his BP to drop too low- and that could put him at risk for another stroke. Educated pt /wife on this.  ? 3/3- BP running 185U-314H systolic- will start Avapro 75 mg daily this AM-  will monitor closely.  ? 3/7- BP controlled- con't regimen ?9.  BPH.  Flomax 0.4 mg daily.  Check PVR ? 3/2- having urgency- however don't want to stop since has significant BPH per wife; also don't want to change to another BPH med- could drop BP too much.  ? 3/7- con't flomax- although has some urgency, also has BPH and pt agrees doesn't want retention.  ?10.  History of chronic thrombocytopenia.  Platelets 120,000-154000 at baseline.  Follow-up CBC ? 3/1- Plts 99k- was 102 yesterday- will monitor since on Lovenox ? 3/3- labs Monday ? 3/6- Plts 104k- stable -con't regimen ?11.  Hyperlipidemia.  Crestor ?12.  Recent bilateral carpal tunnel surgeries.  Follow-up outpatient- can use gloves to protect wrists/hands on walker/assistive device ?13. Dispo ? 3/3- pt asking for dietician for d/w pt/wife about dietary changes.  ? 3/6- change diet to heart healthy and get rid of carb modified - pt also can have pass to go outside ot see family.  ?14. AKI ? 3/6- Cr 1.28- up from 1.11- will recheck Thursday.  ? ? ?I spent a total of  44  minutes on total care today- >50% coordination of care- due to education on Lovenox and Team conference to determine length of stay and discussing outpt therapy.  ? ? ?  ? ? ? ? ?LOS: ?7 days ?A FACE TO FACE EVALUATION WAS PERFORMED ? ?Launi Asencio ?07/11/2021, 8:13 AM  ? ? ? ?

## 2021-07-11 NOTE — Progress Notes (Signed)
Occupational Therapy Session Note ? ?Patient Details  ?Name: RUBERT FREDIANI ?MRN: [id number] ?Date of Birth: 05-26-1950 ? ?Today's Date: 07/11/2021 ?OT Individual Time: 1030-1130 and [phone] ?OT Individual Time Calculation (min): 60 min and 68 min ? ? ?Short Term Goals: ?Week 1:  OT Short Term Goal 1 (Week 1): Pt will perform ambulatory toilet transfer with CGA and RW ?OT Short Term Goal 2 (Week 1): Pt will perform shower level bathing CGA with AE PRN ?OT Short Term Goal 3 (Week 1): Pt will improve LUE to 3+/5 grossly ?OT Short Term Goal 4 (Week 1): Pt will perform 3/3 toileting tasks CGA ? ? ?Skilled Therapeutic Interventions/Progress Updates:  ?  Session 1: Pt greeted at time of session sitting up in recliner agreeable to OT session, no pain and eager to participate. Pt ambulating chair > bathroom with quad cane and CGA  transferring to shower bench same manner. Pt able to gather clothes from dresser and place in shower, also able to doff clothing and gather from floor height all with CGA/Supervision. Pt bathing mostly seated with Supervision/Set up assist and standing to wash buttocks with good standing balance. Note pt gathered items for shower prior to bathing and putting within reach in bathroom. Pt ambulating shower > BSC in bathroom to finish drying off and dress. Pt ambulating throughout room and bathroom with quad cane to gather deodorant, grooming items, and remainder of items for dressing. Pt wanting to work on kneeling tasks 2/2 wanting to be able to crouch down to the ground for cleaning tasks for cat litter box. Simulating home set up with doorway entry, kneeling down on L knee x4 trials with Min-CGA throughout and cues for form. Set up in reclinder call bell in reach all needs met. ? ?Session 2: Pt greeted at time of session sitting up in recliner agreeable to OT session and no pain, ambulating room <> ADL apartment <> gym with Supervision with quad cane and ACE wrap on LLE to improve DF. In  apartment, focused on IADL retraining for setting the table, retrieving items from high and low cabinets, simulated cooking task for preferred meals, and retrieving multiple items from fridge at various heights all with Supervision and no LOB. Pt receptive to education and performed task well. Ambulated to main gym and performed sit > supine > prone > quadruped with Supervision and with wrists in neutral, performed the following: donkey kicks with each LE 2x5 each, 1x5 incorporating contralateral LE + UE for added challenge. Pt also performing unilateral tasks in quadruped using LUE to trace, draw, and perform [facility] activities at mirror. 1x10 quats + overhead press with 8# dumbbell as well with mirror for form. Back in room set up call bell in reach and wife present.  ? ?Therapy Documentation ?Precautions:  ?Precautions ?Precautions: Fall ?Precaution Comments: L hemi, B STR with R>L sensitivity ?Restrictions ?Weight Bearing Restrictions: No ? ? ? ?Therapy/Group: Individual Therapy ? ?Viona Gilmore ?07/11/2021, 7:27 AM ?

## 2021-07-11 NOTE — Progress Notes (Signed)
Orthopedic Tech Progress Note ?Patient Details:  ?Eddie Calhoun ?01-13-1951 ?[id number] ? ?Called in order to [facility] for an AFO CONSULT ? ?Patient ID: Eddie Calhoun, male   DOB: April 25, 1951, 71 y.o.   MRN: [id number] ? ?Janit Pagan ?07/11/2021, 2:58 PM ? ?

## 2021-07-11 NOTE — Progress Notes (Signed)
Physical Therapy Session Note ? ?Patient Details  ?Name: Eddie Calhoun ?MRN: [id number] ?Date of Birth: 07/02/1950 ? ?Today's Date: 07/11/2021 ?PT Individual Time: 1300-1400 ?PT Individual Time Calculation (min): 60 min  ? ?Short Term Goals: ?Week 1:  PT Short Term Goal 1 (Week 1): Pt will navigate stairs with 1 hand rail ?PT Short Term Goal 2 (Week 1): Pt will ambulate with RW and CGA ?PT Short Term Goal 3 (Week 1): Pt will perform STS with supevision consistently ? ?Skilled Therapeutic Interventions/Progress Updates:  ?  Pt received in recliner and agreeable to therapy.  No complaint of pain. Pt with several questions about different surfaces, so focused on gait with varying surfaces. Pt ambulated with DF assist wrap and quad cane to [place] entrance with min A-CGA, once instance of LOB with min A to correct when distracted by new environment. Pt ambulated up/down long incline, navigated stairs with rail and cane, navigated curb with cane, ambulated over grass throughout session. Min A for stairs and curb, with instruction on using cane to navigate. Seated rest breaks spent discussing home set up, prognosis, and d/c plan. Pt returned to room and to recliner, was left with all needs in reach and alarm active.  ? ?Therapy Documentation ?Precautions:  ?Precautions ?Precautions: Fall ?Precaution Comments: L hemi, B STR with R>L sensitivity ?Restrictions ?Weight Bearing Restrictions: No ?General: ?  ? ? ? ?Therapy/Group: Individual Therapy ? ?[place] ?07/11/2021, 4:21 PM  ?

## 2021-07-11 NOTE — Progress Notes (Signed)
Patient ID: Eddie Calhoun, male   DOB: Feb 05, 1951, 71 y.o.   MRN: [id number]  Met with pt and spoke with wife via telephone to discuss team conference goals of mod/I level and discharge date of 3/13. Pt is very motivated to do well and recover from this stroke. Have scheduled wife to come in Thursday 1:00-3:00 for education. Her concern is who will he be following up with as an OP for neurology and will the appointment be made prior to his discharge on Monday. Will continue to work toward discharge Monday ?

## 2021-07-11 NOTE — Patient Care Conference (Signed)
Inpatient RehabilitationTeam Conference and Plan of Care Update ?Date: 07/11/2021   Time: 11:40 AM  ? ? ?Patient Name: Eddie Calhoun      ?Medical Record Number: [id number]  ?Date of Birth: 1950/12/10 ?Sex: Male         ?Room/Bed: 5Q00Q/6P61P-50 ?Payor Info: Payor: MEDICARE / Plan: MEDICARE PART A AND B / Product Type: *No Product type* /   ? ?Admit Date/Time:  07/04/2021  2:41 PM ? ?Primary Diagnosis:  Lacunar infarction ([place]) ? ?Hospital Problems: Principal Problem: ?  Lacunar infarction [facility]) ? ? ? ?Expected Discharge Date: Expected Discharge Date: 07/17/21 ? ?Team Members Present: ?Physician leading conference: Dr. Courtney Heys ?Social Worker Present: Ovidio Kin, LCSW ?Nurse Present: Dorthula Nettles, RN ?PT Present: Ailene Rud, PT ?OT Present: Lillia Corporal, OT ?PPS Coordinator present : Gunnar Fusi, SLP ? ?   Current Status/Progress Goal Weekly Team Focus  ?Bowel/Bladder ? ? Patient is continent of bladder/bowel LBM 06/12/21  Maintain contience  Assess tolieting needs QS/PRN   ?Swallow/Nutrition/ Hydration ? ?           ?ADL's ? ? CGA/Supervision overall for: shower level bathing, dressing, ambulation, transfers, etc. grip strength improving, hand sensitivity, very motivated  Supervision - may upgrade to Mod I  endurance, dynamic standing, higher level balance, IADL retraining, family ed?   ?Mobility ? ? gait inconsist min A-CGA, transfers CGA  mod I-supervision  gait training   ?Communication ? ?           ?Safety/Cognition/ Behavioral Observations ?           ?Pain ? ? States no pain  <+ 2/10,has ordered for Tylenol prn  assess pain q 4hr and prn   ?Skin ? ? Skin intact  Maintain skin integrity  Assess QS/PRN address new onset and notify medical team   ? ? ?Discharge Planning:  ?Home with wife who is able to assist, pt would really like to get to mod/i level before going home if possible   ?Team Discussion: ?Urinary urgency, having to go every 1-2 hours. Doesn't want Lovenox injections. Decreased Avapro.  Cr elevated. Will need outpatient therapy. Continent B/B, no pain reported. Wife willing to assist at discharge.  ? ?Patient on target to meet rehab goals: ?yes, supervision/mod I goals. Grip strength improved. Currently at supervision, very motivated. Consistently CGA/supervision, sometimes min assist. May need AFO. Has ataxia. May upgrade goals to Mod I overall. ? ?*See Care Plan and progress notes for long and short-term goals.  ? ?Revisions to Treatment Plan:  ?Adjusting medications ?  ?Teaching Needs: ?Family education, medication management, transfer/gait training, etc. ?  ?Current Barriers to Discharge: ?No current barriers noted ? ?Possible Resolutions to Barriers: ?All barriers addressed ?  ? ? Medical Summary ?Current Status: continent, no painp skin is good ? Barriers to Discharge: Decreased family/caregiver support;Home enviroment access/layout;Weight bearing restrictions;Medical stability ? Barriers to Discharge Comments: Cr is elevated- recheck Thursday- ?Possible Resolutions to [employer]: is at [place] dominant = upgrade goals-CGA- supervision; - needs AFO- d/c 3/13 ? ? ?Continued Need for Acute Rehabilitation Level of Care: The patient requires daily medical management by a physician with specialized training in physical medicine and rehabilitation for the following reasons: ?Direction of a multidisciplinary physical rehabilitation program to maximize functional independence : Yes ?Medical management of patient stability for increased activity during participation in an intensive rehabilitation regime.: Yes ?Analysis of laboratory values and/or radiology reports with any subsequent need for medication adjustment and/or medical intervention. : Yes ? ? ?  I attest that I was present, lead the team conference, and concur with the assessment and plan of the team. ? ? ?Dorthula Nettles G ?07/11/2021, 5:02 PM  ? ? ? ? ? ? ?

## 2021-07-12 NOTE — Progress Notes (Signed)
?                                                       PROGRESS NOTE ? ? ?Subjective/Complaints: ? ?Pt reports up since 5am, but that's normal for him.  ?LBM yesterday and usually goes after breakfast and coffee.  ?Sleeping well and things going great- happy with d/c date.  ?  ?  ?ROS:  ? ?Pt denies SOB, abd pain, CP, N/V/C/D, and vision changes ? ? ? ? ?Objective: ?  ?No results found. ?Recent Labs  ?  07/10/21 ?8144  ?WBC 4.4  ?HGB 12.8*  ?HCT 35.7*  ?PLT 104*  ? ? ?Recent Labs  ?  07/10/21 ?8185  ?NA 137  ?K 4.1  ?CL 105  ?CO2 23  ?GLUCOSE 95  ?BUN 21  ?CREATININE 1.28*  ?CALCIUM 8.8*  ? ? ? ?Intake/Output Summary (Last 24 hours) at 07/12/2021 6314 ?Last data filed at 07/12/2021 0700 ?Gross per 24 hour  ?Intake 696 ml  ?Output 850 ml  ?Net -154 ml  ?  ? ?  ? ?Physical Exam: ?Vital Signs ?Blood pressure 134/79, pulse (!) 59, temperature 97.9 ?F (36.6 ?C), temperature source Oral, resp. rate 16, height '5\' 10"'$  (1.778 m), weight 90.9 kg, SpO2 97 %. ? ? ? ? ? ?General: awake, alert, appropriate, sitting up in bedside chair in room; NAD ?HENT: conjugate gaze; oropharynx moist ?CV: regular rhythm and borderline bradycardic rate; no JVD ?Pulmonary: CTA B/L; no W/R/R- good air movement ?GI: soft, NT, ND, (+)BS ?Psychiatric: appropriate, cheerful ?Neurological: Ox3 ? ?Ext: no clubbing, cyanosis, or edema ?Psych: pleasant and cooperative  ?Musculoskeletal:  ?   Cervical back: Neck supple. No tenderness.  ?     ?Skin: ?   Comments: IV's out ?L CTS surgery incision on L wrist healed- done 12/22 ?R CTS incision scabbed over done 1/23  ?Neurological:  ?LUE is 5-/5 throughout ?LLE- HF/KE/KF 4+/5; DF and PF 3-/5- improving ?   Mental Status: He is oriented to person, place, and time.  ?   Sensory: No sensory deficit.  ?   Comments: Patient is alert.  Makes eye contact with examiner.  Oriented x3 and follows commands.  ambulates and struggles with clearance of left foot at times. With cueing he corrected.   ? ?Assessment/Plan: ?1.  Functional deficits which require 3+ hours per day of interdisciplinary therapy in a comprehensive inpatient rehab setting. ?Physiatrist is providing close team supervision and 24 hour management of active medical problems listed below. ?Physiatrist and rehab team continue to assess barriers to discharge/monitor patient progress toward functional and medical goals ? ?Care Tool: ? ?Bathing ?   ?Body parts bathed by patient: Right arm, Left arm, Chest, Abdomen, Front perineal area, Right upper leg, Left upper leg, Buttocks, Face, Right lower leg, Left lower leg  ? Body parts bathed by helper: Right lower leg, Left lower leg, Buttocks ?  ?  ?Bathing assist Assist Level: Supervision/Verbal cueing ?  ?  ?Upper Body Dressing/Undressing ?Upper body dressing   ?What is the patient wearing?: Pull over shirt ?   ?Upper body assist Assist Level: Set up assist ?   ?Lower Body Dressing/Undressing ?Lower body dressing ? ? ?   ?What is the patient wearing?: Pants, Underwear/pull up ? ?  ? ?Lower body assist Assist for lower body dressing:  Supervision/Verbal cueing ?   ? ?Toileting ?Toileting    ?Toileting assist Assist for toileting: Supervision/Verbal cueing ?  ?  ?Transfers ?Chair/bed transfer ? ?Transfers assist ?   ? ?Chair/bed transfer assist level: Supervision/Verbal cueing ?  ?  ?Locomotion ?Ambulation ? ? ?Ambulation assist ? ?   ? ?Assist level: Minimal Assistance - Patient > 75% ?Assistive device: Cane-quad ?Max distance: 150  ? ?Walk 10 feet activity ? ? ?Assist ?   ? ?Assist level: Minimal Assistance - Patient > 75% ?Assistive device: Cane-quad  ? ?Walk 50 feet activity ? ? ?Assist   ? ?Assist level: Minimal Assistance - Patient > 75% ?Assistive device: Cane-quad  ? ? ?Walk 150 feet activity ? ? ?Assist   ? ?Assist level: Minimal Assistance - Patient > 75% ?Assistive device: Cane-quad ?  ? ?Walk 10 feet on uneven surface  ?activity ? ? ?Assist Walk 10 feet on uneven surfaces activity did not occur: Safety/medical  concerns ? ? ?  ?   ? ?Wheelchair ? ? ? ? ?Assist Is the patient using a wheelchair?: Yes ?Type of Wheelchair: Manual ?  ? ?Wheelchair assist level: Supervision/Verbal cueing ?Max wheelchair distance: 100  ? ? ?Wheelchair 50 feet with 2 turns activity ? ? ? ?Assist ? ?  ?  ? ? ?Assist Level: Supervision/Verbal cueing  ? ?Wheelchair 150 feet activity  ? ? ? ?Assist ?   ? ? ?Assist Level: Dependent - Patient 0%  ? ?Blood pressure 134/79, pulse (!) 59, temperature 97.9 ?F (36.6 ?C), temperature source Oral, resp. rate 16, height '5\' 10"'$  (1.778 m), weight 90.9 kg, SpO2 97 %. ? ?Medical Problem List and Plan: ?1. 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  Status post IV TNKase ?            -patient may  shower ?            -ELOS/Goals: 10-12 days mod I to supervision ? D/c date 07/17/21 ?Con't CIR- PT and OT- making great gains  ?2.  Antithrombotics: ?-DVT/anticoagulation:  Mechanical:  Antiembolism stockings, knee (TED hose) Bilateral lower extremities ?3/7- went over risks/benefits of lovenox- pt doesn't want to start- feels he's walking far enough- walked 0.37 miles on treadmill yesterday.  ? 3/8- will not prescribe lovenox per above discussion ?            -antiplatelet therapy: Aspirin 81 mg daily and Plavix 75 mg daily ?3. Pain Management: Tylenol as needed ? 3/7- denies pain- con't regimen ?4. Mood: Provide emotional support ?            -antipsychotic agents: N/A ?5. Neuropsych: This patient is capable of making decisions on his own behalf. ?6. Skin/Wound Care: Routine skin checks ?7. Fluids/Electrolytes/Nutrition: Routine in and outs with follow-up chemistries ? 3/1- looking great- reviewed independently;- con't to monitor weekly.  ? 3/6- electrolytes look good.  ?8.  Hypertension.  Avapro resumed at 300 mg daily.  Monitor with increased mobility ? 3/1- BP controlled this AM and bradycardic due to fitness level- con't  regimen ? 3/2- Avapro isn't on order list- hasn't received and BP is actually perfectly controlled- don't want to restart at 300 mg daily- will do 75 mg daily- since could cause his BP to drop too low- and that could put him at risk for another stroke. Educated pt /wife on this.  ? 3/8- BP controlled on Avapro 75 mg daily.  ?9.  BPH.  Flomax 0.4 mg daily.  Check PVR ? 3/2- having urgency- however don't want to stop since has significant BPH per wife; also don't want to change to another BPH med- could drop BP too much.  ? 3/7- con't flomax- although has some urgency, also has BPH and pt agrees doesn't want retention.  ?10.  History of chronic thrombocytopenia.  Platelets 120,000-154000 at baseline.  Follow-up CBC ? 3/1- Plts 99k- was 102 yesterday- will monitor since on Lovenox ? 3/3- labs Monday ? 3/6- Plts 104k- stable -con't regimen ?11.  Hyperlipidemia.  Crestor ?12.  Recent bilateral carpal tunnel surgeries.  Follow-up outpatient- can use gloves to protect wrists/hands on walker/assistive device ?13. Dispo ? 3/3- pt asking for dietician for d/w pt/wife about dietary changes.  ? 3/6- change diet to heart healthy and get rid of carb modified - pt also can have pass to go outside ot see family.  ? 3/8- doing better on no carb modifications ?14. AKI ? 3/6- Cr 1.28- up from 1.11- will recheck Thursday.  ?15. Dispo ? 3/8- family asking for [place] to make Neurology appt now- they want to have that on the books ASAP- insistent they know the name of Neurologist.  ? ? ? ? ? I spent a total of  36  minutes on total care today- >50% coordination of care- due to prolonged exam and d/w SW ? ? ? ? ? ?LOS: ?8 days ?A FACE TO FACE EVALUATION WAS PERFORMED ? ?Nikia Mangino ?07/12/2021, 8:21 AM  ? ? ? ?

## 2021-07-12 NOTE — Progress Notes (Signed)
Physical Therapy Weekly Progress Note ? ?Patient Details  ?Name: Eddie Calhoun ?MRN: [id number] ?Date of Birth: Aug 09, 1950 ? ?Beginning of progress report period: July 05, 2021 ?End of progress report period: July 12, 2021 ? ?Today's Date: 07/12/2021 ?PT Individual Time: 1300-1420 ?PT Individual Time Calculation (min): 80 min  ? ?Patient has met 3 of 3 short term goals.  Pt ambulating with largely supervision, only requiring CGA when walking >500 ft d/t fatigue. Stairs with cane and hand rail with CGA.  ? ?Patient continues to demonstrate the following deficits muscle weakness, impaired timing and sequencing, ataxia, and decreased coordination, and decreased standing balance and hemiplegia and therefore will continue to benefit from skilled PT intervention to increase functional independence with mobility. ? ?Patient progressing toward long term goals..  Plan of care revisions: Upgraded d/t progress. ? ?PT Short Term Goals ?Week 1:  PT Short Term Goal 1 (Week 1): Pt will navigate stairs with 1 hand rail ?PT Short Term Goal 2 (Week 1): Pt will ambulate with RW and CGA ?PT Short Term Goal 2 - Progress (Week 1): Met ?PT Short Term Goal 3 (Week 1): Pt will perform STS with supevision consistently ?PT Short Term Goal 3 - Progress (Week 1): Met ?Week 2:  PT Short Term Goal 1 (Week 2): =LTGs d/t ELOS ? ?Skilled Therapeutic Interventions/Progress Updates:  ?Pt seated in recliner on arrival with his wife present for family education. Donned DF assist wrap tot A. Pt ambulated with hurrycane and DF assist throughout session with supervision. Pt ambulated long distances, held hands with his wife. Provided extensive pt and family education on different levels of assist Rohan might need, but that goal is to go home largely mod I.  Discussed HHA on even surfaces, etc if needed. Trialled PLS AFO in place of assist wrap. Will further assess, as may be more support than appropriate, but noted improved ankle stability with stiffer brace.  Pt performed floor transfer x 3 with both legs. Required assist getting LLE under hip when transitioning from half kneel to tall kneel. Able to perform with R half kneel with supervision with BUE support on mat and cane. Pt also navigated stairs x 8 with L hand rail and cane. Pt's wife with several questions about expected prognosis and f/u PT care. Therapist answered as able. Also discussed handicap parking placard and referred to CSW for paperwork. Pt returned to room and had continent urine void in standing with distant supervision. Checked off Pt's wife for assisting with toileting and walking on unit on days with no therapy. Pt remained in recliner after session and was left with all needs in reach and alarm active.  ? ?Therapy Documentation ?Precautions:  ?Precautions ?Precautions: Fall ?Precaution Comments: L hemi, B STR with R>L sensitivity ?Restrictions ?Weight Bearing Restrictions: No ?General: ?  ? ? ? ?Therapy/Group: Individual Therapy ? ?[place] ?07/12/2021, 2:35 PM  ?

## 2021-07-12 NOTE — Progress Notes (Signed)
Physical Therapy Session Note ? ?Patient Details  ?Name: Eddie Calhoun ?MRN: [id number] ?Date of Birth: 14-Jun-1950 ? ?Today's Date: 07/12/2021 ?PT Individual Time: 0800-0900 ?PT Individual Time Calculation (min): 60 min  ? ?Short Term Goals: ?Week 1:  PT Short Term Goal 1 (Week 1): Pt will navigate stairs with 1 hand rail ?PT Short Term Goal 2 (Week 1): Pt will ambulate with RW and CGA ?PT Short Term Goal 3 (Week 1): Pt will perform STS with supevision consistently ? ?Skilled Therapeutic Interventions/Progress Updates:  ?  Pt received seated in recliner in room, agreeable to PT session. No complaints of pain this AM. Sit to stand with CGA to close Supervision during session with use of [facility] and/or "hurricane". Trial gait with use of "hurricane" and L DF assist ACE wrap x 300 ft with CGA for balance. Pt benefits from use of "hurricane" vs LBQC due to decreased footprint of device and decreased tripping hazard. Pt has already ordered a "hurricane" for use at home. Standing LLE 6" step-ups with 2 handrails and laterally with one handrail with CGA for balance for LLE strengthening. Backwards ambulation 4 x 30 ft with handrail and CGA to min A for balance, cues for decreased step length. Pt exhibits some R knee instability especially with onset of fatigue. Sidesteps L/R 4 x 30 ft with use of cane and CGA for balance with focus on balance and hip strengthening. Pt left seated in recliner in room with needs in reach at end of session. ? ?Therapy Documentation ?Precautions:  ?Precautions ?Precautions: Fall ?Precaution Comments: L hemi, B STR with R>L sensitivity ?Restrictions ?Weight Bearing Restrictions: No ? ? ? ? ? ?Therapy/Group: Individual Therapy ? ? ?Excell Seltzer, PT, DPT, CSRS ? ?07/12/2021, 12:41 PM  ?

## 2021-07-12 NOTE — Progress Notes (Signed)
Occupational Therapy Weekly Progress Note ? ?Patient Details  ?Name: Eddie Calhoun ?MRN: [id number] ?Date of Birth: 1950/12/02 ? ?Beginning of progress report period: July 05, 2021 ?End of progress report period: July 12, 2021 ? ?Today's Date: 07/12/2021 ?OT Individual Time: 9093-1121 ?OT Individual Time Calculation (min): 58 min  ? ? ?Patient has met 4 of 4 short term goals.  Pt has made excellent progress toward OT goals performing ADLs with Supervision for shower level bathing, dressing, and toileting tasks. Pt is performing self care tasks with LUE with improved grip strength and FMC. Family ed completed with wife on 5/9 and upcoming DC with Mod I goals.  ? ?Patient continues to demonstrate the following deficits: muscle weakness, decreased cardiorespiratoy endurance, abnormal tone, ataxia, decreased coordination, and decreased motor planning, decreased motor planning, and decreased standing balance, decreased postural control, hemiplegia, and decreased balance strategies and therefore will continue to benefit from skilled OT intervention to enhance overall performance with BADL and iADL. ? ?Patient progressing toward long term goals..  Continue plan of care. At Mod I. ? ?OT Short Term Goals ?Week 1:  OT Short Term Goal 1 (Week 1): Pt will perform ambulatory toilet transfer with CGA and RW ?OT Short Term Goal 1 - Progress (Week 1): Met ?OT Short Term Goal 2 (Week 1): Pt will perform shower level bathing CGA with AE PRN ?OT Short Term Goal 2 - Progress (Week 1): Met ?OT Short Term Goal 3 (Week 1): Pt will improve LUE to 3+/5 grossly ?OT Short Term Goal 3 - Progress (Week 1): Met ?OT Short Term Goal 4 (Week 1): Pt will perform 3/3 toileting tasks CGA ?OT Short Term Goal 4 - Progress (Week 1): Met ?Week 2:  OT Short Term Goal 1 (Week 2): STG = LTG at Mod I ? ?Skilled Therapeutic Interventions/Progress Updates:  ?  Pt greeted at time of session sitting up in recliner agreeable to OT session with no pain, ambulating  recliner > bathroom Supervision and simulating front entry method with new cane as previous practiced with RW but now no longer needs. Pt ambulating room <> gym with Supervision and hurrycane. In standing at high/low table, focused on LUE FMC/GMC with multiple Prosperity activities including bocks and blocks test (39 with L hand), in hand manipulation and translation of very small items, and 2 rounds of black/blue high resistance clips to improve pinch strength. From EOM, pt performing the following with 6# dumbbells in standing with mirror for feedback and symmetry: bicep curl, front raise, overhead press and with 3# dumbbells performed lateral raise, needing MIN A to fully obtain ROM for LUE 2/2 fatigue for last few reps. Back in room set up call bell in reach all needs met.  ? ?Therapy Documentation ?Precautions:  ?Precautions ?Precautions: Fall ?Precaution Comments: L hemi, B STR with R>L sensitivity ?Restrictions ?Weight Bearing Restrictions: No ? ? ? ? ?Therapy/Group: Individual Therapy ? ?Eddie Calhoun ?07/12/2021, 7:25 AM  ?

## 2021-07-13 LAB — BASIC METABOLIC PANEL
Anion gap: 7 (ref 5–15)
BUN: 20 mg/dL (ref 8–23)
CO2: 24 mmol/L (ref 22–32)
Calcium: 8.9 mg/dL (ref 8.9–10.3)
Chloride: 107 mmol/L (ref 98–111)
Creatinine, Ser: 1.32 mg/dL — ABNORMAL HIGH (ref 0.61–1.24)
GFR, Estimated: 58 mL/min — ABNORMAL LOW (ref >60)
Glucose, Bld: 97 mg/dL (ref 70–99)
Potassium: 4.1 mmol/L (ref 3.5–5.1)
Sodium: 138 mmol/L (ref 135–145)

## 2021-07-13 NOTE — Progress Notes (Addendum)
Occupational Therapy Session Note ? ?Patient Details  ?Name: Eddie Calhoun ?MRN: [id number] ?Date of Birth: 02/02/1951 ? ?Today's Date: 07/13/2021 ?OT Individual Time: 4098-1191 ?OT Individual Time Calculation (min): 68 min  ? ? ?Short Term Goals: ?Week 1:  OT Short Term Goal 1 (Week 1): Pt will perform ambulatory toilet transfer with CGA and RW ?OT Short Term Goal 2 (Week 1): Pt will perform shower level bathing CGA with AE PRN ?OT Short Term Goal 3 (Week 1): Pt will improve LUE to 3+/5 grossly ?OT Short Term Goal 4 (Week 1): Pt will perform 3/3 toileting tasks CGA ? ? ?Skilled Therapeutic Interventions/Progress Updates:  ?  Pt greeted at time of session sitting up in recliner with wife [name] present for family ed, who remained throughout session. Pt and wife education on hemitechniques, safety tips for maneuvering in small spaces, ADL techniques, DME needs, etc. Pt performing entire ADL routine for UB/LB bathing at shower level primarily using LUE for tasks, ADL transfers, and UB/LB dressing with occasional Supervision - Set up with AFO on during transfers and use of new hurrycane from home. Note extended time for questions but pt and family very motivated and open to all techniques and tips. Pt up in chair with call bell in reach awaiting nursing education. No pain throughout session.  ? ?Therapy Documentation ?Precautions:  ?Precautions ?Precautions: Fall ?Precaution Comments: L hemi, B STR with R>L sensitivity ?Restrictions ?Weight Bearing Restrictions: No ? ? ? ? ?Therapy/Group: Individual Therapy ? ?Viona Gilmore ?07/13/2021, 7:25 AM ?

## 2021-07-13 NOTE — Evaluation (Signed)
Recreational Therapy Assessment and Plan ? ?Patient Details  ?Name: Eddie Calhoun ?MRN: [id number] ?Date of Birth: Oct 02, 1950 ?Today's Date: 07/13/2021 ? ?Rehab Potential:  Good ?ELOS:   d/c 3/13 ? ?Assessment ?Hospital Problem: Principal Problem: ?  Lacunar infarction [facility]) ?  ?  ?Past Medical History:  ?    ?Past Medical History:  ?Diagnosis Date  ? Allergy    ?  allergic rhinitis  ? BPH (benign prostatic hyperplasia)    ? Elevated prostate specific antigen (PSA)    ? Hypertension    ? Plantar fasciitis of right foot    ? Pneumonia    ? Thrombocytopenia, unspecified ([place]) 07/26/2012  ?  03/24/12  129,000!  ?  ?Past Surgical History: History reviewed. No pertinent surgical history. ?  ?Assessment & Plan ?Clinical Impression: Eddie Calhoun is a 71 year old left handed male with history of hypertension, recent bilateral carpal tunnel surgeries, BPH, quit smoking 33 years ago thrombocytopenia around 120,000.  Per chart review lives with spouse.  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  Presented 07/01/2021 with acute onset of left-sided weakness and left facial droop.  Cranial CT scan negative for acute process and patient did receive IV TNKase.  MRA head and neck no emergent finding.  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  Echocardiogram with ejection fraction of 60 to 65% no wall motion abnormalities.  Admission chemistries unremarkable except creatinine 1.26, glucose 108, platelets 120,000, hemoglobin 12.7.  Neurology follow-up placed on aspirin 81 mg daily and Plavix 75 mg daily for CVA prophylaxis.  Permissive hypertension and patient on Benicar 40 mg daily prior to admission.  Tolerating a regular consistency diet.  Therapy evaluations completed due to patient's left-sided weakness was admitted for a comprehensive rehab program.  Patient transferred to CIR on 07/04/2021.  ?  ?Met with pt today to discuss TR services including leisure  education, activity analysis/modifications and stress management.  Also discussed the importance of social, emotional, spiritual health in addition to physical health and their effects on overall health and wellness.  Pt stated understanding.  Discussed community reintegration/outing, pt agreeable to participate in tomorrow. ? ?Pt presents with decreased activity tolerance, decreased functional mobility, decreased balance, decreased coordination, feelings of stress Limiting pt's independence with leisure/community pursuits.   ? ?Plan ? Min 1 TR session >20 minutes during LOS ? ?Recommendations for other services: None  ? ?Discharge Criteria: Patient will be discharged from TR if patient refuses treatment 3 consecutive times without medical reason.  If treatment goals not met, if there is a change in medical status, if patient makes no progress towards goals or if patient is discharged from hospital. ? ?The above assessment, treatment plan, treatment alternatives and goals were discussed and mutually agreed upon: by patient ? ?Session note: ? ?No c/o pain. ?Pt participated in stress managment/coping group today.  Pt education/discussion focused on stress exploration including factors that contribute to stress, factors that protect against stress and potential coping strategies.  Coping strategies included deep breathing, progressive muscle relaxation, imagery & challenging irrational thoughts.  Handouts provided.  Pt appreciative. ? ? ? ? ?Eddie Calhoun ?07/13/2021, 4:04 PM  ?

## 2021-07-13 NOTE — Progress Notes (Signed)
Occupational Therapy Session Note ? ?Patient Details  ?Name: Eddie Calhoun ?MRN: [id number] ?Date of Birth: 02-29-1951 ? ?Today's Date: 07/13/2021 ?OT Group Time: 1100-1200 ?OT Group Time Calculation (min): 60 min ? ? ?Short Term Goals: ?Week 1:  OT Short Term Goal 1 (Week 1): Pt will perform ambulatory toilet transfer with CGA and RW ?OT Short Term Goal 2 (Week 1): Pt will perform shower level bathing CGA with AE PRN ?OT Short Term Goal 3 (Week 1): Pt will improve LUE to 3+/5 grossly ?OT Short Term Goal 4 (Week 1): Pt will perform 3/3 toileting tasks CGA ? ?Skilled Therapeutic Interventions/Progress Updates:  ?Pt participated in group session with a focus on stress mgmt, education on healthy coping strategies, and social interaction. Focus of session on providing coping strategies to manage new current level of function as a result of new diagnosis.  Session focus on breaking down stressors into ?daily hassles,? ?major life stressors? and ?life circumstances? in an effort to allow pts to chunk their stressors into groups. Pt actively sharing stressors and contributing to group conversation. Provided active listening, emotional support and therapeutic use of self. Offered education on factors that protect [place] against stress such as ?daily uplifts,? ?healthy coping strategies? and ?protective factors.? Encouraged all group members to make an effort to actively recall one event from their day that was a daily uplift in an effort to protect their mindset from stressors as well as sharing this information with their caregivers to facilitate improved caregiver communication and decrease overall burden of care.  Issued pt handouts on healthy coping strategies to implement into routine. Pt transported back to room by RT. ? ?Therapy Documentation ?Precautions:  ?Precautions ?Precautions: Fall ?Precaution Comments: L hemi, B STR with R>L sensitivity ?Restrictions ?Weight Bearing Restrictions: No ? ? ?Pain: no pain reported  during session  ? ? ? ?Therapy/Group: Group Therapy ? ?Precious Haws ?07/13/2021, 12:17 PM ?

## 2021-07-13 NOTE — Progress Notes (Signed)
?                                                       PROGRESS NOTE ? ? ?Subjective/Complaints: ? ? ?Pt reports is now walking with single point cane.  ?Bowels working daily.  ?Asking about DVTs vs clots to brain- asking about cause of stroke.  ?  ?ROS:  ? ?Pt denies SOB, abd pain, CP, N/V/C/D, and vision changes ? ? ? ? ?Objective: ?  ?No results found. ?No results for input(s): WBC, HGB, HCT, PLT in the last 72 hours. ? ? ?Recent Labs  ?  07/13/21 ?7616  ?NA 138  ?K 4.1  ?CL 107  ?CO2 24  ?GLUCOSE 97  ?BUN 20  ?CREATININE 1.32*  ?CALCIUM 8.9  ? ? ? ?Intake/Output Summary (Last 24 hours) at 07/13/2021 0842 ?Last data filed at 07/13/2021 0737 ?Gross per 24 hour  ?Intake 1138 ml  ?Output 600 ml  ?Net 538 ml  ?  ? ?  ? ?Physical Exam: ?Vital Signs ?Blood pressure 118/68, pulse (!) 51, temperature 98.3 ?F (36.8 ?C), temperature source Oral, resp. rate 18, height '5\' 10"'$  (1.778 m), weight 90.9 kg, SpO2 95 %. ? ? ? ? ? ? ?General: awake, alert, appropriate, sitting at sink; getting dressed/washed up; NAD ?HENT: conjugate gaze; oropharynx moist ?CV: regular rhythm bradycardic  rate; no JVD ?Pulmonary: CTA B/L; no W/R/R- good air movement ?GI: soft, NT, ND, (+)BS ?Psychiatric: appropriate ?Neurological: Ox3 ? ?Ext: no clubbing, cyanosis, or edema ?Psych: pleasant and cooperative  ?Musculoskeletal:  ?   Cervical back: Neck supple. No tenderness.  ?     ?Skin: ?   Comments: IV's out ?L CTS surgery incision on L wrist healed- done 12/22 ?R CTS incision scabbed over done 1/23  ?Neurological:  ?LUE is 5-/5 throughout ?LLE- HF/KE/KF 4+/5; DF and PF 3-/5- improving ?   Mental Status: He is oriented to person, place, and time.  ?   Sensory: No sensory deficit.  ?   Comments: Patient is alert.  Makes eye contact with examiner.  Oriented x3 and follows commands.  ambulates and struggles with clearance of left foot at times. With cueing he corrected.   ? ?Assessment/Plan: ?1. Functional deficits which require 3+ hours per day of  interdisciplinary therapy in a comprehensive inpatient rehab setting. ?Physiatrist is providing close team supervision and 24 hour management of active medical problems listed below. ?Physiatrist and rehab team continue to assess barriers to discharge/monitor patient progress toward functional and medical goals ? ?Care Tool: ? ?Bathing ?   ?Body parts bathed by patient: Right arm, Left arm, Chest, Abdomen, Front perineal area, Right upper leg, Left upper leg, Buttocks, Face, Right lower leg, Left lower leg  ? Body parts bathed by helper: Right lower leg, Left lower leg, Buttocks ?  ?  ?Bathing assist Assist Level: Supervision/Verbal cueing ?  ?  ?Upper Body Dressing/Undressing ?Upper body dressing   ?What is the patient wearing?: Pull over shirt ?   ?Upper body assist Assist Level: Set up assist ?   ?Lower Body Dressing/Undressing ?Lower body dressing ? ? ?   ?What is the patient wearing?: Pants, Underwear/pull up ? ?  ? ?Lower body assist Assist for lower body dressing: Supervision/Verbal cueing ?   ? ?Toileting ?Toileting    ?Toileting assist Assist for  toileting: Supervision/Verbal cueing ?  ?  ?Transfers ?Chair/bed transfer ? ?Transfers assist ?   ? ?Chair/bed transfer assist level: Supervision/Verbal cueing ?  ?  ?Locomotion ?Ambulation ? ? ?Ambulation assist ? ?   ? ?Assist level: Supervision/Verbal cueing ?Assistive device: Cane-quad ?Max distance: 300  ? ?Walk 10 feet activity ? ? ?Assist ?   ? ?Assist level: Supervision/Verbal cueing ?Assistive device: Cane-quad  ? ?Walk 50 feet activity ? ? ?Assist   ? ?Assist level: Supervision/Verbal cueing ?Assistive device: Cane-quad  ? ? ?Walk 150 feet activity ? ? ?Assist   ? ?Assist level: Supervision/Verbal cueing ?Assistive device: Cane-quad ?  ? ?Walk 10 feet on uneven surface  ?activity ? ? ?Assist Walk 10 feet on uneven surfaces activity did not occur: Safety/medical concerns ? ? ?  ?   ? ?Wheelchair ? ? ? ? ?Assist Is the patient using a wheelchair?: Yes ?Type  of Wheelchair: Manual ?  ? ?Wheelchair assist level: Supervision/Verbal cueing ?Max wheelchair distance: 100  ? ? ?Wheelchair 50 feet with 2 turns activity ? ? ? ?Assist ? ?  ?  ? ? ?Assist Level: Supervision/Verbal cueing  ? ?Wheelchair 150 feet activity  ? ? ? ?Assist ?   ? ? ?Assist Level: Dependent - Patient 0%  ? ?Blood pressure 118/68, pulse (!) 51, temperature 98.3 ?F (36.8 ?C), temperature source Oral, resp. rate 18, height '5\' 10"'$  (1.778 m), weight 90.9 kg, SpO2 95 %. ? ?Medical Problem List and Plan: ?1. 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  Status post IV TNKase ?            -patient may  shower ?            -ELOS/Goals: 10-12 days mod I to supervision ? D/c date 07/17/21 ?Con't CIR- Con't PT and OT- educated pt about DVTs not usually causing stroke unless has hole in heart- which is very unlikely.   ?2.  Antithrombotics: ?-DVT/anticoagulation:  Mechanical:  Antiembolism stockings, knee (TED hose) Bilateral lower extremities ?3/7- went over risks/benefits of lovenox- pt doesn't want to start- feels he's walking far enough- walked 0.37 miles on treadmill yesterday.  ? 3/8- will not prescribe lovenox per above discussion ?            -antiplatelet therapy: Aspirin 81 mg daily and Plavix 75 mg daily ?3. Pain Management: Tylenol as needed ? 3/7- denies pain- con't regimen ?4. Mood: Provide emotional support ?            -antipsychotic agents: N/A ?5. Neuropsych: This patient is capable of making decisions on his own behalf. ?6. Skin/Wound Care: Routine skin checks ?7. Fluids/Electrolytes/Nutrition: Routine in and outs with follow-up chemistries ? 3/1- looking great- reviewed independently;- con't to monitor weekly.  ? 3/6- electrolytes look good.  ?8.  Hypertension.  Avapro resumed at 300 mg daily.  Monitor with increased mobility ? 3/1- BP controlled this AM and bradycardic due to fitness level- con't  regimen ? 3/9- will stop Avapro- since Cr is slowly rising and BUN OK- will push fluids- but usually 1.1-1.2- up slightly to 1.32, however BP was OK off Avapro- so will try without and recheck labs Sunday ?9.  BPH.  Flomax 0.4 mg daily.  Check PVR ? 3/2- having urgency- however don't want to stop since has significant BPH per wife; also don't want to change to another BPH med- could drop BP too much.  ?  3/7- con't flomax- although has some urgency, also has BPH and pt agrees doesn't want retention.  ?10.  History of chronic thrombocytopenia.  Platelets 120,000-154000 at baseline.  Follow-up CBC ? 3/1- Plts 99k- was 102 yesterday- will monitor since on Lovenox ? 3/3- labs Monday ? 3/6- Plts 104k- stable -con't regimen ?11.  Hyperlipidemia.  Crestor ?12.  Recent bilateral carpal tunnel surgeries.  Follow-up outpatient- can use gloves to protect wrists/hands on walker/assistive device ?13. Dispo ? 3/3- pt asking for dietician for d/w pt/wife about dietary changes.  ? 3/6- change diet to heart healthy and get rid of carb modified - pt also can have pass to go outside ot see family.  ? 3/8- doing better on no carb modifications ?14. AKI ? 3/6- Cr 1.28- up from 1.11- will recheck  ?3/9- Cr up slightly more to 1.32- will stop Avapro and let pt know thinking recheck labs Sunday ?15. Dispo ? 3/8- family asking for [place] to make Neurology appt now- they want to have that on the books ASAP- insistent they know the name of Neurologist.  ? ? ?I spent a total of  52  minutes on total care today- >50% coordination of care- due to prolonged d/w with PA as well as pt x2 ? ? ? ? ? ?LOS: ?9 days ?A FACE TO FACE EVALUATION WAS PERFORMED ? ?Reve Crocket ?07/13/2021, 8:42 AM  ? ? ? ?

## 2021-07-13 NOTE — Progress Notes (Signed)
Physical Therapy Session Note ? ?Patient Details  ?Name: Eddie Calhoun ?MRN: [id number] ?Date of Birth: 1951/04/06 ? ?Today's Date: 07/13/2021 ?PT Individual Time: 6184-8592 ?PT Individual Time Calculation (min): 57 min  ? ?Short Term Goals: ?Week 1:  PT Short Term Goal 1 (Week 1): Pt will navigate stairs with 1 hand rail ?PT Short Term Goal 2 (Week 1): Pt will ambulate with RW and CGA ?PT Short Term Goal 2 - Progress (Week 1): Met ?PT Short Term Goal 3 (Week 1): Pt will perform STS with supevision consistently ?PT Short Term Goal 3 - Progress (Week 1): Met ? ?Skilled Therapeutic Interventions/Progress Updates:  ?  Pt received in recliner and agreeable to therapy.  No complaint of pain. Pt's wife present throughout session for family education. Discussed home environment, avoiding dual tasking, watching speed, gait deviations to be aware of, and other topics for safe d/c home. Pt ambulated throughout session with supervision (occ CGA) and hurrycane. Incr ataxia noted with dual tasking, but easily corrected with cues to focus on gait. Pt navigated flight of stairs x 3 with both therapist and wife, [name], with CGA. Kathie expressed being concerned about reaching, so transitioned to ADL apartment, pt able to get cups and plates down from cabinets and carry across room. Discussed adaptations and limitations (like having less liquid in a cup, not carrying full plates for the time being). Pt able to carry a cup of water balanced on a plate with no spillage, LOB, or stumble. Pt also carried a measuring cup full of water with minimal spillage. Discussed limiting distracting tasks for time being to reduce risk of falls at home. Pt returned to room and to recliner. was left with all needs in reach and alarm active.  ? ?Therapy Documentation ?Precautions:  ?Precautions ?Precautions: Fall ?Precaution Comments: L hemi, B STR with R>L sensitivity ?Restrictions ?Weight Bearing Restrictions: No ?General: ?  ? ? ? ? ?Therapy/Group:  Individual Therapy ? ?[place] ?07/13/2021, 4:00 PM  ?

## 2021-07-14 NOTE — Progress Notes (Signed)
Recreational Therapy Discharge Summary ?Patient Details  ?Name: KHALIQ TURAY ?MRN: [id number] ?Date of Birth: 1950-10-25 ?Today's Date: 07/14/2021 ? ?Long term goals set:  ? ?Long term goals met: 1 ? ?Comments on progress toward goals:  Pt has made excellent progress during LOS and is excited to be returning home 3/13.  Pt requires contact guard assist for curb and steps negotiation in community environment but supervision using cane ambulatory level.   Pt also participated in stress management/coping group as well during LOS .  ? ?Reasons goals not met: n/a ? ?Equipment acquired: n/a ? ?Reasons for discharge: treatment goals met ? ?Follow-up: Outpatient ? ?Patient/family agrees with progress made and goals achieved: Yes ? ?Diannah Rindfleisch ?07/14/2021, 9:00 AM ? ? ?

## 2021-07-14 NOTE — Progress Notes (Signed)
Inpatient Rehabilitation Care Coordinator ?Discharge Note  ? ?Patient Details  ?Name: Eddie Calhoun ?MRN: [id number] ?Date of Birth: Apr 04, 1951 ? ? ?Discharge location: HOME WITH WIFE WHO IS ABLE TO PROVIDE 24/7 SUPERVISION ? ?Length of Stay: 12 DAYS ? ?Discharge activity level: SUPERVISION LEVEL ? ?Home/community participation: ACTIVE ? ?Patient response YT:KZSWFU Literacy - How often do you need to have someone help you when you read instructions, pamphlets, or other written material from your doctor or pharmacy?: Never ? ?Patient response XN:ATFTDD Isolation - How often do you feel lonely or isolated from those around you?: Never ? ?Services provided included: RD, PT, MD, OT, RN, CM, Pharmacy, SW ? ?Financial Services:  ?Charity fundraiser Utilized: Medicare ?  ? ?Choices offered to/list presented to: PT AND WIFE ? ?Follow-up services arranged:  ?Outpatient ?   ?Outpatient Servicies: [facility] [address] APPOINTMENT 3/14 12:15-2:00 PM PT AND OT HAS GOTTEN HURRY CANE AND TUB SEAT ON THEIR OWN ?  ?  ? ?Patient response to transportation need: ?Is the patient able to respond to transportation needs?: Yes ?In the past 12 months, has lack of transportation kept you from medical appointments or from getting medications?: No ?In the past 12 months, has lack of transportation kept you from meetings, work, or from getting things needed for daily living?: No ? ? ? ?Comments (or additional information):WIFE WAS IN FOR EDUCATION AND BOTH COMFORTABLE WITH CARE NEEDS AND DISCHARGE ? ?Patient/Family verbalized understanding of follow-up arrangements:  Yes ? ?Individual responsible for coordination of the follow-up plan: KATHY-WIFE [phone] ? ?Confirmed correct DME delivered: Elease Hashimoto 07/14/2021   ? ?Elease Hashimoto ?

## 2021-07-14 NOTE — Progress Notes (Addendum)
Patient ID: Eddie Calhoun, male   DOB: 1951-01-08, 71 y.o.   MRN: [id number]  Met with pt who had just returned from an outing. He reports eduction went well yesterday with wife and his outing to the grocery store went well. He plans to go to [facility] due to MD recommended this. Have called over and left a message to make a referral. Pt would like an appointment before he leaves on Monday ? ?3:00 PM OP rehab appointment at 3/14 at 12:15-2:00 pm both PT & OT ?

## 2021-07-14 NOTE — Discharge Summary (Signed)
Physician Discharge Summary  Patient ID: GAVON MAJANO MRN: [id number] DOB/AGE: 1951-02-04 71 y.o.  Admit date: 07/04/2021 Discharge date: 07/17/2021  Discharge Diagnoses:  Principal Problem:   Lacunar infarction [facility]) Hypertension BPH Hyperlipidemia Recent bilateral carpal tunnel surgery AKI History of chronic thrombocytopenia  Discharged Condition: Stable  Significant Diagnostic Studies: CT HEAD WO CONTRAST (5MM)  Result Date: 07/02/2021 CLINICAL DATA:  Stroke follow-up. EXAM: CT HEAD WITHOUT CONTRAST TECHNIQUE: Contiguous axial images were obtained from the base of the skull through the vertex without intravenous contrast. RADIATION DOSE REDUCTION: This exam was performed according to the departmental dose-optimization program which includes automated exposure control, adjustment of the mA and/or kV according to patient size and/or use of iterative reconstruction technique. COMPARISON:  CT dated 07/01/2021. MRI dated 07/02/2021 FINDINGS: Brain: Faint area of hypodensity involving the posterior right basal ganglia (20/4) may correspond to the infarcts seen on the MRI. The ventricles and sulci are appropriate size for patient's age. The gray-white matter discrimination is otherwise preserved. There is no acute intracranial hemorrhage. No mass effect or midline shift. No extra-axial fluid collection. Vascular: No hyperdense vessel or unexpected calcification. Skull: Normal. Negative for fracture or focal lesion. Sinuses/Orbits: No acute finding. Other: None IMPRESSION: 1. No acute intracranial hemorrhage. 2. Faint area of hypodensity involving the posterior right basal ganglia may correspond to the infarcts seen on the MRI. Electronically Signed   By: Anner Crete M.D.   On: 07/02/2021 21:20   MR ANGIO HEAD WO CONTRAST  Result Date: 07/02/2021 CLINICAL DATA:  Stroke follow-up EXAM: MRI HEAD WITHOUT CONTRAST MRA HEAD WITHOUT CONTRAST MRA OF THE NECK WITHOUT AND WITH CONTRAST TECHNIQUE:  Multiplanar, multi-echo pulse sequences of the brain and surrounding structures were acquired without intravenous contrast. Angiographic images of the Circle of Willis were acquired using MRA technique without intravenous contrast. Angiographic images of the neck were acquired using MRA technique without and with intravenous contrast. Carotid stenosis measurements (when applicable) are obtained utilizing NASCET criteria, using the distal internal carotid diameter as the denominator. CONTRAST:  33m GADAVIST GADOBUTROL 1 MMOL/ML IV SOLN COMPARISON:  Head CT from yesterday FINDINGS: MR HEAD FINDINGS Brain: Band of restricted diffusion at the posterior right putamen, corona radiata, and posterior caudate nucleus. No pre-existing ischemic injury. No hemorrhage, hydrocephalus, or collection Vascular: Arterial findings below Skull and upper cervical spine: Normal marrow signal. Facet spurring asymmetric to the left at C4-5. Sinuses/Orbits: Negative MRA HEAD FINDINGS Anterior circulation: Aplastic left A1 segment with fetal type left PCA. No branch occlusion, beading, or aneurysm. Negative for significant stenosis. Posterior circulation: Left dominant. The vertebral and basilar arteries are smoothly contoured and widely patent. No PCA branch occlusion. Minor right P3/4 segment stenosis. MRA NECK FINDINGS Antegrade flow in the carotid and vertebral arteries by time-of-flight. Unremarkable neck mask. Normal appearance of the arch and great vessels. Proximal subclavian, vertebral, and carotid arteries are smoothly contoured and widely patent on both sides. 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 Intracranial MRA: No emergent finding. No branch occlusion or stenosis to correlate with the infarct. Neck MRA: Negative. Electronically Signed   By: JJorje GuildM.D.   On: 07/02/2021 07:13   MR ANGIO NECK W WO CONTRAST  Result Date: 07/02/2021 CLINICAL DATA:  Stroke  follow-up EXAM: MRI HEAD WITHOUT CONTRAST MRA HEAD WITHOUT CONTRAST MRA OF THE NECK WITHOUT AND WITH CONTRAST TECHNIQUE: Multiplanar, multi-echo pulse sequences of the brain and surrounding structures were acquired without intravenous contrast. Angiographic images  of the Circle of Willis were acquired using MRA technique without intravenous contrast. Angiographic images of the neck were acquired using MRA technique without and with intravenous contrast. Carotid stenosis measurements (when applicable) are obtained utilizing NASCET criteria, using the distal internal carotid diameter as the denominator. CONTRAST:  13m GADAVIST GADOBUTROL 1 MMOL/ML IV SOLN COMPARISON:  Head CT from yesterday FINDINGS: MR HEAD FINDINGS Brain: Band of restricted diffusion at the posterior right putamen, corona radiata, and posterior caudate nucleus. No pre-existing ischemic injury. No hemorrhage, hydrocephalus, or collection Vascular: Arterial findings below Skull and upper cervical spine: Normal marrow signal. Facet spurring asymmetric to the left at C4-5. Sinuses/Orbits: Negative MRA HEAD FINDINGS Anterior circulation: Aplastic left A1 segment with fetal type left PCA. No branch occlusion, beading, or aneurysm. Negative for significant stenosis. Posterior circulation: Left dominant. The vertebral and basilar arteries are smoothly contoured and widely patent. No PCA branch occlusion. Minor right P3/4 segment stenosis. MRA NECK FINDINGS Antegrade flow in the carotid and vertebral arteries by time-of-flight. Unremarkable neck mask. Normal appearance of the arch and great vessels. Proximal subclavian, vertebral, and carotid arteries are smoothly contoured and widely patent on both sides. 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 Intracranial MRA: No emergent finding. No branch occlusion or stenosis to correlate with the infarct. Neck MRA: Negative. Electronically Signed   By: JJorje GuildM.D.   On: 07/02/2021 07:13   MR BRAIN WO CONTRAST  Result Date: 07/02/2021 CLINICAL DATA:  Stroke follow-up EXAM: MRI HEAD WITHOUT CONTRAST MRA HEAD WITHOUT CONTRAST MRA OF THE NECK WITHOUT AND WITH CONTRAST TECHNIQUE: Multiplanar, multi-echo pulse sequences of the brain and surrounding structures were acquired without intravenous contrast. Angiographic images of the Circle of Willis were acquired using MRA technique without intravenous contrast. Angiographic images of the neck were acquired using MRA technique without and with intravenous contrast. Carotid stenosis measurements (when applicable) are obtained utilizing NASCET criteria, using the distal internal carotid diameter as the denominator. CONTRAST:  985mGADAVIST GADOBUTROL 1 MMOL/ML IV SOLN COMPARISON:  Head CT from yesterday FINDINGS: MR HEAD FINDINGS Brain: Band of restricted diffusion at the posterior right putamen, corona radiata, and posterior caudate nucleus. No pre-existing ischemic injury. No hemorrhage, hydrocephalus, or collection Vascular: Arterial findings below Skull and upper cervical spine: Normal marrow signal. Facet spurring asymmetric to the left at C4-5. Sinuses/Orbits: Negative MRA HEAD FINDINGS Anterior circulation: Aplastic left A1 segment with fetal type left PCA. No branch occlusion, beading, or aneurysm. Negative for significant stenosis. Posterior circulation: Left dominant. The vertebral and basilar arteries are smoothly contoured and widely patent. No PCA branch occlusion. Minor right P3/4 segment stenosis. MRA NECK FINDINGS Antegrade flow in the carotid and vertebral arteries by time-of-flight. Unremarkable neck mask. Normal appearance of the arch and great vessels. Proximal subclavian, vertebral, and carotid arteries are smoothly contoured and widely patent on both sides. 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 Intracranial MRA: No emergent finding. No branch  occlusion or stenosis to correlate with the infarct. Neck MRA: Negative. Electronically Signed   By: JoJorje Guild.D.   On: 07/02/2021 07:13   ECHOCARDIOGRAM COMPLETE  Result Date: 07/02/2021    ECHOCARDIOGRAM REPORT   Patient Name:   PATIMONTHY HOVATERate of Exam: 07/02/2021 Medical Rec #:  [id number]     Height:       70.0 in Accession #:    [id number]  Weight:       205.9 lb Date of Birth:  [birth year]       BSA:          2.113 m Patient Age:    89 years        BP:           158/78 mmHg Patient Gender: M               HR:           52 bpm. Exam Location:  Inpatient Procedure: 2D Echo, 3D Echo, Cardiac Doppler, Color Doppler and Strain Analysis Indications:     CVA  History:         Patient has prior history of Echocardiogram examinations, most                  recent 03/11/2006. Risk Factors:Hypertension. 03/11/2006 stress                  echo.  Sonographer:     Luisa Hart RDCS Referring Phys:  [id number] ASHISH ARORA Diagnosing Phys: Rozann Lesches MD IMPRESSIONS  1. Left ventricular ejection fraction, by estimation, is 60 to 65%. The left ventricle has normal function. The left ventricle has no regional wall motion abnormalities. There is mild eccentric left ventricular hypertrophy of the basal segment. Left ventricular diastolic parameters were normal. Normal global longitudinal strain of -18.4%.  2. The right ventricle is mildly enlarged with prominent apical trabeculation. Right ventricular function is normal. Estimated RVSP normal at 23 mmHg.  3. The mitral valve is grossly normal. Mild mitral valve regurgitation.  4. The aortic valve is tricuspid. Aortic valve regurgitation is not visualized. Aortic valve sclerosis is present, with no evidence of aortic valve stenosis. Aortic valve mean gradient measures 5.0 mmHg.  5. The inferior vena cava is normal in size with greater than 50% respiratory variability, suggesting right atrial pressure of 3 mmHg. Comparison(s): Prior images unable to be directly  viewed. FINDINGS  Left Ventricle: Left ventricular ejection fraction, by estimation, is 60 to 65%. The left ventricle has normal function. The left ventricle has no regional wall motion abnormalities. The left ventricular internal cavity size was normal in size. There is  mild eccentric left ventricular hypertrophy of the basal segment. Left ventricular diastolic parameters were normal. Right Ventricle: The right ventricular size is mildly enlarged. No increase in right ventricular wall thickness. Right ventricular systolic function is normal. There is normal pulmonary artery systolic pressure. The tricuspid regurgitant velocity is 2.21  m/s, and with an assumed right atrial pressure of 3 mmHg, the estimated right ventricular systolic pressure is 56.3 mmHg. Left Atrium: Left atrial size was normal in size. Right Atrium: Right atrial size was normal in size. Pericardium: There is no evidence of pericardial effusion. Mitral Valve: The mitral valve is grossly normal. Mild mitral valve regurgitation. Tricuspid Valve: The tricuspid valve is grossly normal. Tricuspid valve regurgitation is trivial. Aortic Valve: The aortic valve is tricuspid. Aortic valve regurgitation is not visualized. Aortic valve sclerosis is present, with no evidence of aortic valve stenosis. Aortic valve mean gradient measures 5.0 mmHg. Aortic valve peak gradient measures 9.9  mmHg. Aortic valve area, by VTI measures 2.84 cm. Pulmonic Valve: The pulmonic valve was grossly normal. Pulmonic valve regurgitation is trivial. Aorta: The aortic root is normal in size and structure. Venous: The inferior vena cava is normal in size with greater than 50% respiratory variability, suggesting right atrial pressure of 3 mmHg. IAS/Shunts: No atrial  level shunt detected by color flow Doppler.  LEFT VENTRICLE PLAX 2D LVIDd:         5.00 cm     Diastology LVIDs:         2.40 cm     LV e' medial:    7.62 cm/s LV PW:         0.70 cm     LV E/e' medial:  8.6 LV IVS:         1.10 cm     LV e' lateral:   10.40 cm/s LVOT diam:     2.10 cm     LV E/e' lateral: 6.3 LV SV:         101 LV SV Index:   48 LVOT Area:     3.46 cm  LV Volumes (MOD) LV vol d, MOD A2C: 58.5 ml LV vol d, MOD A4C: 81.1 ml LV vol s, MOD A2C: 25.4 ml LV vol s, MOD A4C: 25.0 ml LV SV MOD A2C:     33.1 ml LV SV MOD A4C:     81.1 ml LV SV MOD BP:      44.2 ml RIGHT VENTRICLE RV Basal diam:  5.10 cm RV Mid diam:    4.30 cm RV S prime:     13.30 cm/s TAPSE (M-mode): 3.2 cm LEFT ATRIUM             Index        RIGHT ATRIUM           Index LA diam:        2.70 cm 1.28 cm/m   RA Area:     20.30 cm LA Vol (A2C):   53.3 ml 25.22 ml/m  RA Volume:   61.80 ml  29.24 ml/m LA Vol (A4C):   40.9 ml 19.35 ml/m LA Biplane Vol: 47.3 ml 22.38 ml/m  AORTIC VALVE                     PULMONIC VALVE AV Area (Vmax):    2.74 cm      PV Vmax:          1.16 m/s AV Area (Vmean):   2.74 cm      PV Vmean:         76.600 cm/s AV Area (VTI):     2.84 cm      PV VTI:           0.245 m AV Vmax:           157.00 cm/s   PV Peak grad:     5.3 mmHg AV Vmean:          105.000 cm/s  PV Mean grad:     2.5 mmHg AV VTI:            0.356 m       PR End Diast Vel: 3.36 msec AV Peak Grad:      9.9 mmHg AV Mean Grad:      5.0 mmHg LVOT Vmax:         124.00 cm/s LVOT Vmean:        83.200 cm/s LVOT VTI:          0.292 m LVOT/AV VTI ratio: 0.82  AORTA Ao Root diam: 3.10 cm Ao Asc diam:  3.50 cm MITRAL VALVE               TRICUSPID VALVE MV Area (PHT): 2.99 cm    TR Peak  grad:   19.5 mmHg MV Decel Time: 254 msec    TR Vmax:        221.00 cm/s MV E velocity: 65.50 cm/s MV A velocity: 68.40 cm/s  SHUNTS MV E/A ratio:  0.96        Systemic VTI:  0.29 m                            Systemic Diam: 2.10 cm Rozann Lesches MD Electronically signed by Rozann Lesches MD Signature Date/Time: 07/02/2021/10:54:31 AM    Final (Updated)    CT HEAD CODE STROKE WO CONTRAST  Result Date: 07/01/2021 CLINICAL DATA:  Code stroke.  Left-sided weakness and numbness. EXAM: CT  HEAD WITHOUT CONTRAST TECHNIQUE: Contiguous axial images were obtained from the base of the skull through the vertex without intravenous contrast. RADIATION DOSE REDUCTION: This exam was performed according to the departmental dose-optimization program which includes automated exposure control, adjustment of the mA and/or kV according to patient size and/or use of iterative reconstruction technique. COMPARISON:  None. FINDINGS: Brain: No evidence of acute infarction, hemorrhage, cerebral edema, mass, mass effect, or midline shift. Ventricles and sulci are normal for age. No extra-axial fluid collection. Vascular: No hyperdense vessel or unexpected calcification. Skull: Normal. Negative for fracture or focal lesion. Sinuses/Orbits: No acute finding. Other: The mastoid air cells are well aerated. ASPECTS [facility] Stroke Program Early CT Score) - Ganglionic level infarction (caudate, lentiform nuclei, internal capsule, insula, M1-M3 cortex): 7 - Supraganglionic infarction (M4-M6 cortex): 3 Total score (0-10 with 10 being normal): 10 IMPRESSION: 1. No acute intracranial process. 2. ASPECTS is 10 Code stroke imaging results were communicated on 07/01/2021 at 10:27 pm to provider Dr. Rory Percy via secure text paging. Electronically Signed   By: Merilyn Baba M.D.   On: 07/01/2021 22:27    Labs:  Basic Metabolic Panel: Recent Labs  Lab 07/10/21 0526 07/13/21 0551 07/16/21 0644  NA 137 138 134*  K 4.1 4.1 4.1  CL 105 107 104  CO2 '23 24 22  '$ GLUCOSE 95 97 98  BUN '21 20 19  '$ CREATININE 1.28* 1.32* 1.30*  CALCIUM 8.8* 8.9 9.0    CBC: Recent Labs  Lab 07/10/21 0526  WBC 4.4  NEUTROABS 2.9  HGB 12.8*  HCT 35.7*  MCV 95.2  PLT 104*    CBG: No results for input(s): GLUCAP in the last 168 hours.  Family history.  Mother with hypertension father with hypertension.  Father with prostate cancer.  Denies any colon cancer esophageal cancer or rectal cancer  Brief HPI:   AKARI CRYSLER is a 71 y.o.  right-handed male with history of recent bilateral carpal tunnel surgery BPH quit smoking 33 years ago, Thrombocytopeni around 120,000.  Per chart review lives with spouse independent prior to admission.  Presented 07/01/2021 with acute onset of left-sided weakness and facial droop.  Cranial CT scan negative for acute process patient did receive intravenous TNKase.  MRA head and neck no emergent finding.  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  Echocardiogram with ejection fraction of 60 to 65% no wall motion abnormalities.  Admission chemistries unremarkable except creatinine 1.26 glucose 108 platelet 120,000.  Neurology follow-up placed on aspirin and Plavix for CVA prophylaxis.  Permissive hypertension patient had been on Benicar prior to admission.  Tolerating a regular diet.  Therapy evaluations completed due to patient's left-sided weakness decreased functional mobility was admitted  for a comprehensive rehab program.   Hospital Course: JAHRED TATAR was admitted to rehab 07/04/2021 for inpatient therapies to consist of PT, ST and OT at least three hours five days a week. Past admission physiatrist, therapy team and rehab RN have worked together to provide customized collaborative inpatient rehab.  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  Status post intravenous TNKase.  Patient remained on aspirin and Plavix therapy x3 weeks and aspirin alone and patient would follow-up neurology services.  SCDs for DVT prophylaxis.  It was discussed Lovenox for DVT prophylaxis however patient was ambulating 0.37 miles on treadmill and patient requested no Lovenox.  Blood pressure monitored he had been on Benicar prior to admission mild AKI 1.26 appears to be close to baseline his Avapro was stopped at this time and monitored and resume as needed.  Blood pressure remained controlled.   BPH voiding without difficulty remained on Flomax.  Chronic thrombocytopenia platelets around 120-154,000 no bleeding episodes.  Crestor for hyperlipidemia.   Blood pressures were monitored on TID basis and controlled     Rehab course: During patient's stay in rehab weekly team conferences were held to monitor patient's progress, set goals and discuss barriers to discharge. At admission, patient required moderate assist 80 feet rolling walker minimal assist sit to stand  Physical exam.  Blood pressure 104/77 pulse 55 temperature 97.8 respirations 16 oxygen saturations 96% room air Constitutional.  No acute distress HEENT Head.  Normocephalic and atraumatic Eyes.  Pupils round and reactive to light no discharge without nystagmus Neck.  Supple nontender no JVD without thyromegaly Cardiac regular rate rhythm moderate extra sounds or murmur heard Abdomen.  Soft nontender positive bowel sounds without rebound Respiratory effort normal no respiratory distress without wheeze Musculoskeletal. Comments.  Right upper extremity right lower extremity 5/5 Left upper extremity 4 -/5 in biceps triceps wrist extension grip and FA Lower extremities hip flexion 3 -/5 knee extension knee flexion 4 -/5, dorsiflexion 0/5 and plantarflexion 2/5 Neurologic.  Alert oriented x3 follows commands fair insight and awareness  He/She  has had improvement in activity tolerance, balance, postural control as well as ability to compensate for deficits. He/She has had improvement in functional use RUE/LUE  and RLE/LLE as well as improvement in awareness.  Patient ambulates throughout session with supervision occasional contact-guard mild ataxia.  Navigated flight of stairs x3 with wife contact-guard.  Patient perform entire ADL routine upper lower body bathing and shower level primarily using left upper extremity for tasks, ADL transfers and upper lower body dressing with occasional supervision.  He did wear an AFO brace.   Full family teaching completed plan discharged to home       Disposition: Discharge to home    Diet: Regular  Special Instructions: No driving smoking or alcohol  Medications at discharge. 1.  Tylenol as needed 2.  Aspirin 81 mg p.o. daily 3.  Plavix 75 mg p.o. daily x9 days and stop 4.  Protonix 40 mg p.o. nightly 5.  Crestor 20 mg p.o. daily 6.  Flomax 0.4 mg daily 7.  Benicar 40 mg daily   30-35 minutes were spent completing discharge summary and discharge planning  Discharge Instructions     Ambulatory referral to Neurology   Complete by: As directed    An appointment is requested in approximately: 4 weeks lacunar infarction   Ambulatory referral to Physical Medicine Rehab   Complete by: As directed  Moderate complexity follow-up 1 to 2 weeks lacunar CVA        Follow-up Information     Lovorn, Jinny Blossom, MD Follow up.   Specialty: Physical Medicine and Rehabilitation Why: Office to call for appointment Contact information: [address] [place] [address] [phone]                 Signed: Cathlyn Parsons 07/17/2021, 5:28 AM

## 2021-07-14 NOTE — Progress Notes (Signed)
Physical Therapy Session Note ? ?Patient Details  ?Name: Eddie Calhoun ?MRN: [id number] ?Date of Birth: 11/29/1950 ? ?Today's Date: 07/14/2021 ?PT Individual Time: 1030-1200 ?PT Individual Time Calculation (min): 90 min  ? ?Short Term Goals: ?Week 2:  PT Short Term Goal 1 (Week 2): =LTGs d/t ELOS ? ?Skilled Therapeutic Interventions/Progress Updates:  ?   ? ?Pt is agreeable to therapy. No complaint of pain. Session focused on community outing with recreational therapist to provide pt with challenges outside of rehab. Pt ambulates to [place] with [facility] and cues for safety and posture. Pt navigates 2 steps to get into back of van, requiring CGA and cues for step sequencing and safety. On ride to grocery store, PT has pt identify potential safety concerns and preparing for community outing. Pt steps out of van with CGA and cues for positioning and safety. Pt navigates curb with CGA and cues for correct step sequencing. Pt ambulates around grocery store with hurrycane and supervision, with cues to visually scan environment. Pt performs pathfinding challenge to locate various items in grocery store, and reaches outside BOS to retrieve items. Pt problem solves carrying multiple items and ensuring energy conservation. Pt navigates self-checkout supervision. Pt steps back up into van with CGA and cues for sequencing and positioning. Pt left with recreation therapist. ? ?Therapy Documentation ?Precautions:  ?Precautions ?Precautions: Fall ?Precaution Comments: L hemi, B STR with R>L sensitivity ?Restrictions ?Weight Bearing Restrictions: No ? ? ? ?Therapy/Group: Individual Therapy ? ?Breck Coons, PT, DPT ?07/14/2021, 3:58 PM  ?

## 2021-07-14 NOTE — Progress Notes (Signed)
Occupational Therapy Session Note ? ?Patient Details  ?Name: Eddie Calhoun ?MRN: [id number] ?Date of Birth: 03-21-1951 ? ?Today's Date: 07/14/2021 ?OT Individual Time: 7615-1834 ?OT Individual Time Calculation (min): 59 min  ? ? ?Short Term Goals: ?Week 1:  OT Short Term Goal 1 (Week 1): Pt will perform ambulatory toilet transfer with CGA and RW ?OT Short Term Goal 1 - Progress (Week 1): Met ?OT Short Term Goal 2 (Week 1): Pt will perform shower level bathing CGA with AE PRN ?OT Short Term Goal 2 - Progress (Week 1): Met ?OT Short Term Goal 3 (Week 1): Pt will improve LUE to 3+/5 grossly ?OT Short Term Goal 3 - Progress (Week 1): Met ?OT Short Term Goal 4 (Week 1): Pt will perform 3/3 toileting tasks CGA ?OT Short Term Goal 4 - Progress (Week 1): Met ?Week 2:  OT Short Term Goal 1 (Week 2): STG = LTG at Mod I ? ?Skilled Therapeutic Interventions/Progress Updates:  ?Patient met seated in wc in agreement with OT treatment session. 0/10 pain reported at rest and with activity. Sit to stand from wc and functional mobility to commode in bathroom with supervision A. Completed 3/3 parts of toileting task and grooming standing at sink level with supervision. Functional mobility to rehab gym with hurrycane in prep for NMR and TA. Focus shifted to LUE NMR with pinch pins. Patient able to place pins (black) around gait belt with focus on LUE external rotation and down around shoes simulating reaching to don footwear. Mobility to clothes line with patient able to place pins from gait belt and shoes onto line. Patient demonstrates fair bimanual manipulation when removing pins from clothes line and cleaning them with wipes (gloves donned with set-up assist). Focus shifted to reassessment of [place]. 9-hole peg test with patient able to complete on non-affected R side in 21.9 sec and with 34.01 sec on affected L side. Box and blocks with patient able to transfer 56 blocks with non-affected RUE and 37 blocks on L dominant side. Patient  able to return demo [facility] exercises learned over duration of stay. Returned to room in same manner as indicated above. Session concluded with patient seated in recliner with call bell within reach all needs met.  ? ?Therapy Documentation ?Precautions:  ?Precautions ?Precautions: Fall ?Precaution Comments: L hemi, B STR with R>L sensitivity ?Restrictions ?Weight Bearing Restrictions: No ?General: ?  ?Therapy/Group: Individual Therapy ? ?Ercell Razon R Howerton-Davis ?07/14/2021, 6:42 AM ?

## 2021-07-14 NOTE — Progress Notes (Signed)
?                                                       PROGRESS NOTE ? ? ?Subjective/Complaints: ? ? ?Pt reports has field trip today; group therapy went ok yesterday.  ?AFO consult to be done today.  ? ?Asking to go to [address] for outpt Rehab.  ?  ?ROS:  ? ?Pt denies SOB, abd pain, CP, N/V/C/D, and vision changes ? ? ? ? ?Objective: ?  ?No results found. ?No results for input(s): WBC, HGB, HCT, PLT in the last 72 hours. ? ? ?Recent Labs  ?  07/13/21 ?9476  ?NA 138  ?K 4.1  ?CL 107  ?CO2 24  ?GLUCOSE 97  ?BUN 20  ?CREATININE 1.32*  ?CALCIUM 8.9  ? ? ? ?Intake/Output Summary (Last 24 hours) at 07/14/2021 0830 ?Last data filed at 07/14/2021 0759 ?Gross per 24 hour  ?Intake 720 ml  ?Output 1650 ml  ?Net -930 ml  ?  ? ?  ? ?Physical Exam: ?Vital Signs ?Blood pressure 119/74, pulse (!) 52, temperature 98.2 ?F (36.8 ?C), temperature source Oral, resp. rate 18, height '5\' 10"'$  (1.778 m), weight 90.9 kg, SpO2 96 %. ? ? ? ? ? ? ? ?General: awake, alert, appropriate, sitting up in w/c bathing at sink;  NAD ?HENT: conjugate gaze; oropharynx moist ?CV: regular rhythm; bradycardic rate; no JVD ?Pulmonary: CTA B/L; no W/R/R- good air movement ?GI: soft, NT, ND, (+)BS ?Psychiatric: appropriate ?Neurological: Ox3 ? ?Ext: no clubbing, cyanosis, or edema ?Psych: pleasant and cooperative  ?Musculoskeletal:  ?   Cervical back: Neck supple. No tenderness.  ?     ?Skin: ?   Comments: IV's out ?L CTS surgery incision on L wrist healed- done 12/22 ?R CTS incision scabbed over done 1/23  ?Neurological:  ?LUE is 5-/5 throughout ?LLE- HF/KE/KF 4+/5; DF and PF 3-/5- improving ?   Mental Status: He is oriented to person, place, and time.  ?   Sensory: No sensory deficit.  ?   Comments: Patient is alert.  Makes eye contact with examiner.  Oriented x3 and follows commands.  ambulates and struggles with clearance of left foot at times. With cueing he corrected.   ? ?Assessment/Plan: ?1. Functional deficits which require 3+ hours per day of  interdisciplinary therapy in a comprehensive inpatient rehab setting. ?Physiatrist is providing close team supervision and 24 hour management of active medical problems listed below. ?Physiatrist and rehab team continue to assess barriers to discharge/monitor patient progress toward functional and medical goals ? ?Care Tool: ? ?Bathing ?   ?Body parts bathed by patient: Right arm, Left arm, Chest, Abdomen, Front perineal area, Right upper leg, Left upper leg, Buttocks, Face, Right lower leg, Left lower leg  ? Body parts bathed by helper: Right lower leg, Left lower leg, Buttocks ?  ?  ?Bathing assist Assist Level: Supervision/Verbal cueing ?  ?  ?Upper Body Dressing/Undressing ?Upper body dressing   ?What is the patient wearing?: Pull over shirt ?   ?Upper body assist Assist Level: Set up assist ?   ?Lower Body Dressing/Undressing ?Lower body dressing ? ? ?   ?What is the patient wearing?: Pants, Underwear/pull up ? ?  ? ?Lower body assist Assist for lower body dressing: Supervision/Verbal cueing ?   ? ?Toileting ?Toileting    ?  Toileting assist Assist for toileting: Supervision/Verbal cueing ?  ?  ?Transfers ?Chair/bed transfer ? ?Transfers assist ?   ? ?Chair/bed transfer assist level: Supervision/Verbal cueing ?  ?  ?Locomotion ?Ambulation ? ? ?Ambulation assist ? ?   ? ?Assist level: Supervision/Verbal cueing ?Assistive device: Cane-quad ?Max distance: 300  ? ?Walk 10 feet activity ? ? ?Assist ?   ? ?Assist level: Supervision/Verbal cueing ?Assistive device: Cane-quad  ? ?Walk 50 feet activity ? ? ?Assist   ? ?Assist level: Supervision/Verbal cueing ?Assistive device: Cane-quad  ? ? ?Walk 150 feet activity ? ? ?Assist   ? ?Assist level: Supervision/Verbal cueing ?Assistive device: Cane-quad ?  ? ?Walk 10 feet on uneven surface  ?activity ? ? ?Assist Walk 10 feet on uneven surfaces activity did not occur: Safety/medical concerns ? ? ?  ?   ? ?Wheelchair ? ? ? ? ?Assist Is the patient using a wheelchair?: Yes ?Type  of Wheelchair: Manual ?  ? ?Wheelchair assist level: Supervision/Verbal cueing ?Max wheelchair distance: 100  ? ? ?Wheelchair 50 feet with 2 turns activity ? ? ? ?Assist ? ?  ?  ? ? ?Assist Level: Supervision/Verbal cueing  ? ?Wheelchair 150 feet activity  ? ? ? ?Assist ?   ? ? ?Assist Level: Dependent - Patient 0%  ? ?Blood pressure 119/74, pulse (!) 52, temperature 98.2 ?F (36.8 ?C), temperature source Oral, resp. rate 18, height '5\' 10"'$  (1.778 m), weight 90.9 kg, SpO2 96 %. ? ?Medical Problem List and Plan: ?1. 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  Status post IV TNKase ?            -patient may  shower ?            -ELOS/Goals: 10-12 days mod I to supervision ? D/c date 07/17/21 ?Con't CIR- Con't PT and OT- educated pt about DVTs not usually causing stroke unless has hole in heart- which is very unlikely. ?Con't CIR_ pt can resume intimacy after d/c at 3-4 weeks-- PT and OT- AFO consult today- is ordered   ?2.  Antithrombotics: ?-DVT/anticoagulation:  Mechanical:  Antiembolism stockings, knee (TED hose) Bilateral lower extremities ?3/7- went over risks/benefits of lovenox- pt doesn't want to start- feels he's walking far enough- walked 0.37 miles on treadmill yesterday.  ? 3/8- will not prescribe lovenox per above discussion ?            -antiplatelet therapy: Aspirin 81 mg daily and Plavix 75 mg daily ?3. Pain Management: Tylenol as needed ? 3/7- denies pain- con't regimen ?4. Mood: Provide emotional support ?            -antipsychotic agents: N/A ?5. Neuropsych: This patient is capable of making decisions on his own behalf. ?6. Skin/Wound Care: Routine skin checks ?7. Fluids/Electrolytes/Nutrition: Routine in and outs with follow-up chemistries ? 3/1- looking great- reviewed independently;- con't to monitor weekly.  ? 3/6- electrolytes look good.  ?8.  Hypertension.  Avapro resumed at 300 mg daily.  Monitor  with increased mobility ? 3/1- BP controlled this AM and bradycardic due to fitness level- con't regimen ? 3/9- will stop Avapro- since Cr is slowly rising and BUN OK- will push fluids- but usually 1.1-1.2- up slightly to 1.32, however BP was OK off Avapro- so will try without and recheck labs Sunday ? 3/10- d/w pt the plan- BP looks great ?9.  BPH.  Flomax 0.4 mg daily.  Check  PVR ? 3/2- having urgency- however don't want to stop since has significant BPH per wife; also don't want to change to another BPH med- could drop BP too much.  ? 3/7- con't flomax- although has some urgency, also has BPH and pt agrees doesn't want retention.  ?10.  History of chronic thrombocytopenia.  Platelets 120,000-154000 at baseline.  Follow-up CBC ? 3/1- Plts 99k- was 102 yesterday- will monitor since on Lovenox ? 3/3- labs Monday ? 3/6- Plts 104k- stable -con't regimen ?11.  Hyperlipidemia.  Crestor ?12.  Recent bilateral carpal tunnel surgeries.  Follow-up outpatient- can use gloves to protect wrists/hands on walker/assistive device ?13. Dispo ? 3/3- pt asking for dietician for d/w pt/wife about dietary changes.  ? 3/6- change diet to heart healthy and get rid of carb modified - pt also can have pass to go outside ot see family.  ? 3/8- doing better on no carb modifications ?14. AKI ? 3/6- Cr 1.28- up from 1.11- will recheck  ?3/9- Cr up slightly more to 1.32- will stop Avapro and let pt know thinking recheck labs Sunday ? 3/10- went over with pt.  ?15. Dispo ? 3/10- advised family and pt made referral to Neuro- they also want [address] for outpt therapies.  ? ? ?I spent a total of 36   minutes on total care today- >50% coordination of care- due to d/w SW about therapies and education of pt about intimacy ? ? ? ? ? ? ?LOS: ?10 days ?A FACE TO FACE EVALUATION WAS PERFORMED ? ?Eddie Calhoun ?07/14/2021, 8:30 AM  ? ? ? ?

## 2021-07-14 NOTE — Progress Notes (Signed)
Recreational Therapy Session Note ? ?Patient Details  ?Name: Eddie Calhoun ?MRN: [id number] ?Date of Birth: 1951/04/25 ?Today's Date: 07/14/2021 ?Time:  1030-12 ?Pain: no c/o ?Skilled Therapeutic Interventions/Progress Updates: Pt participated in Community reintegration/outing to [employer] at [employer] supervision level.  Pt did require contact guard assist for curb & van steps negotiation and min cues for sequencing which foot lead when ascending and descending steps.  Goals focused on safe community mobility, identification & negotiation of obstacles, accessing public restroom, energy conservation techniques/education, UE use & safety awareness.  See outing goal sheet in shadow chart for full details. ? ? ?Therapy/Group: Community Reintegration ? ? ?[name] ?07/14/2021, 8:59 AM  ?

## 2021-07-14 NOTE — Progress Notes (Signed)
Nursing education provided to spouse and patient. Discussed diet, follow up with neurologist, and alcohol avoidance during post stroke recovery period. Patient and family verbalized understanding. ?

## 2021-07-14 NOTE — Progress Notes (Signed)
Physical Therapy Session Note ? ?Patient Details  ?Name: Eddie Calhoun ?MRN: [id number] ?Date of Birth: 05-Apr-1951 ? ?Today's Date: 07/14/2021 ?PT Individual Time: 3704-8889 ?PT Individual Time Calculation (min): 70 min  ? ?Short Term Goals: ?Week 1:  PT Short Term Goal 1 (Week 1): Pt will navigate stairs with 1 hand rail ?PT Short Term Goal 2 (Week 1): Pt will ambulate with RW and CGA ?PT Short Term Goal 2 - Progress (Week 1): Met ?PT Short Term Goal 3 (Week 1): Pt will perform STS with supevision consistently ?PT Short Term Goal 3 - Progress (Week 1): Met ? ?Skilled Therapeutic Interventions/Progress Updates:  ?  Pt received in recliner and agreeable to therapy.  No complaint of pain. Pt performed ambulatory transfer to bathroom and had continent urine void in standing with distant supervision. AFO consult with Brooke Pace, with decision on ottobock walk on AFO. During consult, pt ambulated with cane, performing turns and quick stops, mod I. Transitioned to day room and performed Berg balance as documented below. Patient demonstrates increased fall risk as noted by score of   48/56 on Berg Balance Scale.  (<36= high risk for falls, close to 100%; 37-45 significant >80%; 46-51 moderate >50%; 52-55 lower >25%). Discussed recommendations based on test results and highlighted pt's progress. Returned to room and provided education and recommendations for home to pt and wife, [name], for AD use and continued safety at home. Pt returned to recliner after session and was left with all needs in reach and alarm active.  ? ?Therapy Documentation ?Precautions:  ?Precautions ?Precautions: Fall ?Precaution Comments: L hemi, B STR with R>L sensitivity ?Restrictions ?Weight Bearing Restrictions: No ? ?Balance: ?Standardized Balance Assessment ?Standardized Balance Assessment: Berg Balance Test ?Merrilee Jansky Balance Test ?Sit to Stand: Able to stand without using hands and stabilize independently ?Standing Unsupported: Able to stand safely  2 minutes ?Sitting with Back Unsupported but Feet Supported on Floor or Stool: Able to sit safely and securely 2 minutes ?Stand to Sit: Sits safely with minimal use of hands ?Transfers: Able to transfer safely, definite need of hands ?Standing Unsupported with Eyes Closed: Able to stand 10 seconds safely ?Standing Ubsupported with Feet Together: Able to place feet together independently and stand for 1 minute with supervision ?From Standing, Reach Forward with Outstretched Arm: Can reach forward >12 cm safely (5") ?From Standing Position, Pick up Object from Floor: Able to pick up shoe safely and easily ?From Standing Position, Turn to Look Behind Over each Shoulder: Looks behind one side only/other side shows less weight shift ?Turn 360 Degrees: Able to turn 360 degrees safely but slowly ?Standing Unsupported, Alternately Place Feet on Step/Stool: Able to complete 4 steps without aid or supervision ?Standing Unsupported, One Foot in Front: Able to place foot tandem independently and hold 30 seconds ?Standing on One Leg: Able to lift leg independently and hold > 10 seconds ?Total Score: 48 ? ? ? ?Therapy/Group: Individual Therapy ? ?[place] ?07/14/2021, 4:15 PM  ?

## 2021-07-14 NOTE — Progress Notes (Signed)
Physical Therapy Discharge Summary ? ?Patient Details  ?Name: Eddie Calhoun ?MRN: [id number] ?Date of Birth: 03-27-1951 ? ?Patient has met 6 of 6 long term goals due to improved activity tolerance, improved balance, improved postural control, increased strength, ability to compensate for deficits, and improved coordination.  Patient to discharge at an ambulatory level Modified Independent.   Patient's care partner is independent to provide the necessary physical assistance at discharge. Pt to d/c home with his wife at mod I level with cane and AFO. Pt requires supervision for stairs and curbs. Pt performs floor transfers with cane and supervision. Extensive family education provided to wife [name] on assisting when required and providing supervision for stairs and curbs.  ? ?Reasons goals not met: NA ? ?Recommendation:  ?Patient will benefit from ongoing skilled PT services in outpatient setting to continue to advance safe functional mobility, address ongoing impairments in gait mechanics, balance, coordination, ankle DF, and minimize fall risk. ? ?Equipment: ?No equipment provided ? ?Reasons for discharge: treatment goals met and discharge from hospital ? ?Patient/family agrees with progress made and goals achieved: Yes ? ?PT Discharge ?Precautions/Restrictions ?Precautions ?Precautions: Fall ?Precaution Comments: L hemi, B STR with R>L sensitivity ?Restrictions ?Weight Bearing Restrictions: No ?Vital Signs ?Therapy Vitals ?Temp: 98.3 ?F (36.8 ?C) ?Temp Source: Oral ?Pulse Rate: 61 ?Resp: 17 ?BP: 123/77 ?Patient Position (if appropriate): Sitting ?Oxygen Therapy ?SpO2: 97 % ?O2 Device: Room Air ?Pain Interference ?Pain Interference ?Pain Effect on Sleep: 0. Does not apply - I have not had any pain or hurting in the past 5 days ?Pain Interference with Therapy Activities: 1. Rarely or not at all ?Pain Interference with Day-to-Day Activities: 1. Rarely or not at all ?Vision/Perception  ?Vision - History ?Ability to  See in Adequate Light: 0 Adequate ?Perception ?Perception: Within Functional Limits ?Praxis ?Praxis: Impaired ?Praxis Impairment Details: Motor planning ?Praxis-Other Comments: Improved from baseline  ?Cognition ?Overall Cognitive Status: Within Functional Limits for tasks assessed ?Arousal/Alertness: Awake/alert ?Orientation Level: Oriented X4 ?Year: 2023 ?Month: March ?Day of Week: Correct ?Attention: Focused;Sustained ?Focused Attention: Appears intact ?Sustained Attention: Appears intact ?Memory: Appears intact ?Problem Solving: Appears intact ?Reasoning: Appears intact ?Sequencing: Appears intact ?Organizing: Appears intact ?Safety/Judgment: Appears intact ?Sensation ?Sensation ?Light Touch: Appears Intact ?Hot/Cold: Appears Intact ?Proprioception: Appears Intact ?Coordination ?Gross Motor Movements are Fluid and Coordinated: No ?Fine Motor Movements are Fluid and Coordinated: No ?Coordination and Movement Description: L hemi, improved from baseline ?Motor  ?Motor ?Motor: Hemiplegia ?Motor - Skilled Clinical Observations: L hemi plegia  ?Mobility ?Transfers ?Transfers: Sit to Stand;Stand Pivot Transfers;Stand to Sit ?Sit to Stand: Independent with assistive device ?Stand to Sit: Independent with assistive device ?Stand Pivot Transfers: Independent with assistive device ?Stand Pivot Transfer Details: Verbal cues for safe use of DME/AE;Verbal cues for precautions/safety;Visual cues for safe use of DME/AE;Verbal cues for technique ?Transfer (Assistive device): Straight cane ?Locomotion  ?Gait ?Ambulation: Yes ?Gait Assistance: Independent with assistive device ?Gait Distance (Feet): 500 Feet ?Assistive device: Straight cane ?Gait ?Gait: Yes ?Gait Pattern: Impaired ?Gait Pattern: Left genu recurvatum;Lateral hip instability;Step-through pattern;Left circumduction;Poor foot clearance - left ?Stairs / Additional Locomotion ?Stairs: Yes ?Stairs Assistance: Supervision/Verbal cueing ?Stair Management Technique: One  rail Right;With cane ?Number of Stairs: 12 ?Height of Stairs: 6 ?Ramp: Independent with assistive device ?Curb: Supervision/Verbal cueing ?Pick up small object from the floor assist level: Independent with assistive device ?Pick up small object from the floor assistive device: cane ?Wheelchair Mobility ?Wheelchair Mobility: No  ?Trunk/Postural Assessment  ?Cervical Assessment ?Cervical Assessment: Within Functional Limits ?  Thoracic Assessment ?Thoracic Assessment: Within Functional Limits ?Lumbar Assessment ?Lumbar Assessment: Within Functional Limits ?Postural Control ?Postural Control: Deficits on evaluation  ?Balance ?Balance Assessed: Yes ?Timed Up and Go Test ?TUG: Normal TUG ?Normal TUG (seconds): 8.76 ?Static Sitting Balance ?Static Sitting - Balance Support: Feet supported ?Static Sitting - Level of Assistance: 7: Independent ?Dynamic Sitting Balance ?Dynamic Sitting - Balance Support: Feet supported ?Dynamic Sitting - Level of Assistance: 6: Modified independent (Device/Increase time) ?Static Standing Balance ?Static Standing - Balance Support: Right upper extremity supported;During functional activity ?Static Standing - Level of Assistance: 6: Modified independent (Device/Increase time) ?Dynamic Standing Balance ?Dynamic Standing - Balance Support: Right upper extremity supported;During functional activity ?Dynamic Standing - Level of Assistance: 6: Modified independent (Device/Increase time) ?Standardized Balance Assessment ?Standardized Balance Assessment: Berg Balance Test ?Merrilee Jansky Balance Test ?Sit to Stand: Able to stand without using hands and stabilize independently ?Standing Unsupported: Able to stand safely 2 minutes ?Sitting with Back Unsupported but Feet Supported on Floor or Stool: Able to sit safely and securely 2 minutes ?Stand to Sit: Sits safely with minimal use of hands ?Transfers: Able to transfer safely, definite need of hands ?Standing Unsupported with Eyes Closed: Able to stand 10 seconds  safely ?Standing Ubsupported with Feet Together: Able to place feet together independently and stand for 1 minute with supervision ?From Standing, Reach Forward with Outstretched Arm: Can reach forward >12 cm safely (5") ?From Standing Position, Pick up Object from Floor: Able to pick up shoe safely and easily ?From Standing Position, Turn to Look Behind Over each Shoulder: Looks behind one side only/other side shows less weight shift ?Turn 360 Degrees: Able to turn 360 degrees safely but slowly ?Standing Unsupported, Alternately Place Feet on Step/Stool: Able to complete 4 steps without aid or supervision ?Standing Unsupported, One Foot in Front: Able to place foot tandem independently and hold 30 seconds ?Standing on One Leg: Able to lift leg independently and hold > 10 seconds ?Total Score: 48 ?Extremity Assessment ?RLE Assessment ?RLE Assessment: Within Functional Limits ?RLE Strength ?Right Hip Flexion: 5/5 ?Right Knee Flexion: 5/5 ?Right Knee Extension: 5/5 ?Right Ankle Dorsiflexion: 5/5 ?Right Ankle Plantar Flexion: 5/5 ?LLE Assessment ?LLE Assessment: Exceptions to [facility] ?LLE Strength ?Left Hip Flexion: 5/5 ?Left Knee Flexion: 5/5 ?Left Knee Extension: 5/5 ?Left Ankle Dorsiflexion: 3-/5 ?Left Ankle Plantar Flexion: 1/5 ?  ?  ?  ? ? ? ?[place] ?Excell Seltzer, PT, DPT, CSRS ? ?07/14/2021, 4:26 PM ?

## 2021-07-16 LAB — BASIC METABOLIC PANEL
Anion gap: 8 (ref 5–15)
BUN: 19 mg/dL (ref 8–23)
CO2: 22 mmol/L (ref 22–32)
Calcium: 9 mg/dL (ref 8.9–10.3)
Chloride: 104 mmol/L (ref 98–111)
Creatinine, Ser: 1.3 mg/dL — ABNORMAL HIGH (ref 0.61–1.24)
GFR, Estimated: 59 mL/min — ABNORMAL LOW (ref >60)
Glucose, Bld: 98 mg/dL (ref 70–99)
Potassium: 4.1 mmol/L (ref 3.5–5.1)
Sodium: 134 mmol/L — ABNORMAL LOW (ref 135–145)

## 2021-07-16 NOTE — Progress Notes (Signed)
PROGRESS NOTE   Subjective/Complaints:   Pt reports outpt PT and OT set up for [address]- happy about this.  Asking for handicapped placard- can get one Monday before d/c   ROS:   Pt denies SOB, abd pain, CP, N/V/C/D, and vision changes     Objective:   No results found. No results for input(s): WBC, HGB, HCT, PLT in the last 72 hours.   Recent Labs    07/16/21 0644  NA 134*  K 4.1  CL 104  CO2 22  GLUCOSE 98  BUN 19  CREATININE 1.30*  CALCIUM 9.0     Intake/Output Summary (Last 24 hours) at 07/16/2021 1438 Last data filed at 07/16/2021 0700 Gross per 24 hour  Intake 480 ml  Output 500 ml  Net -20 ml        Physical Exam: Vital Signs Blood pressure (!) 133/91, pulse 66, temperature 97.7 F (36.5 C), temperature source Oral, resp. rate 16, height '5\' 10"'$  (1.778 m), weight 90.9 kg, SpO2 97 %.         General: awake, alert, appropriate,sitting up in bedside chair;  NAD HENT: conjugate gaze; oropharynx moist CV: regular rate; no JVD Pulmonary: CTA B/L; no W/R/R- good air movement GI: soft, NT, ND, (+)BS Psychiatric: appropriate Neurological: Ox3  Ext: no clubbing, cyanosis, or edema Psych: pleasant and cooperative  Musculoskeletal:     Cervical back: Neck supple. No tenderness.       Skin:    Comments: IV's out L CTS surgery incision on L wrist healed- done 12/22 R CTS incision scabbed over done 1/23  Neurological:  LUE is 5-/5 throughout LLE- HF/KE/KF 4+/5; DF and PF 3-/5- improving    Mental Status: He is oriented to person, place, and time.     Sensory: No sensory deficit.     Comments: Patient is alert.  Makes eye contact with examiner.  Oriented x3 and follows commands.  ambulates and struggles with clearance of left foot at times. With cueing he corrected.    Assessment/Plan: 1. Functional deficits which require 3+ hours per day of interdisciplinary therapy in a comprehensive  inpatient rehab setting. Physiatrist is providing close team supervision and 24 hour management of active medical problems listed below. Physiatrist and rehab team continue to assess barriers to discharge/monitor patient progress toward functional and medical goals  Care Tool:  Bathing    Body parts bathed by patient: Right arm, Left arm, Chest, Abdomen, Front perineal area, Right upper leg, Left upper leg, Buttocks, Face, Right lower leg, Left lower leg   Body parts bathed by helper: Right lower leg, Left lower leg, Buttocks     Bathing assist Assist Level: Independent with assistive device     Upper Body Dressing/Undressing Upper body dressing   What is the patient wearing?: Pull over shirt    Upper body assist Assist Level: Independent with assistive device    Lower Body Dressing/Undressing Lower body dressing      What is the patient wearing?: Pants, Underwear/pull up     Lower body assist Assist for lower body dressing: Independent with assitive device     Toileting Toileting    Toileting assist Assist  for toileting: Independent with assistive device     Transfers Chair/bed transfer  Transfers assist     Chair/bed transfer assist level: Independent with assistive device Chair/bed transfer assistive device: Programmer, multimedia   Ambulation assist      Assist level: Independent with assistive device Assistive device: Cane-straight Max distance: 500+   Walk 10 feet activity   Assist     Assist level: Independent with assistive device Assistive device: Cane-straight   Walk 50 feet activity   Assist    Assist level: Independent with assistive device Assistive device: Cane-straight    Walk 150 feet activity   Assist    Assist level: Independent with assistive device Assistive device: Cane-straight    Walk 10 feet on uneven surface  activity   Assist Walk 10 feet on uneven surfaces activity did not occur:  Safety/medical concerns   Assist level: Contact Guard/Touching assist Assistive device: Cane-straight   Wheelchair     Assist Is the patient using a wheelchair?: No Type of Wheelchair: Manual    Wheelchair assist level: Supervision/Verbal cueing Max wheelchair distance: 100    Wheelchair 50 feet with 2 turns activity    Assist        Assist Level: Supervision/Verbal cueing   Wheelchair 150 feet activity     Assist      Assist Level: Dependent - Patient 0%   Blood pressure (!) 133/91, pulse 66, temperature 97.7 F (36.5 C), temperature source Oral, resp. rate 16, height '5\' 10"'$  (1.778 m), weight 90.9 kg, SpO2 97 %.  Medical Problem List and Plan: 1. 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  Status post IV TNKase             -patient may  shower             -ELOS/Goals: 10-12 days mod I to supervision  D/c date 07/17/21 Con't CIR- Con't PT and OT- educated pt about DVTs not usually causing stroke unless has hole in heart- which is very unlikely. Con't CIR_ pt can resume intimacy after d/c at 3-4 weeks-- PT and OT- AFO consult today- is ordered   3/12- d/c tomorrow- con't CIR/therapy today- to start outpt therapies next week 2.  Antithrombotics: -DVT/anticoagulation:  Mechanical:  Antiembolism stockings, knee (TED hose) Bilateral lower extremities 3/7- went over risks/benefits of lovenox- pt doesn't want to start- feels he's walking far enough- walked 0.37 miles on treadmill yesterday.   3/8- will not prescribe lovenox per above discussion             -antiplatelet therapy: Aspirin 81 mg daily and Plavix 75 mg daily 3. Pain Management: Tylenol as needed  3/7- denies pain- con't regimen 4. Mood: Provide emotional support             -antipsychotic agents: N/A 5. Neuropsych: This patient is capable of making decisions on his own behalf. 6. Skin/Wound Care: Routine  skin checks 7. Fluids/Electrolytes/Nutrition: Routine in and outs with follow-up chemistries  3/1- looking great- reviewed independently;- con't to monitor weekly.   3/6- electrolytes look good.  8.  Hypertension.  Avapro resumed at 300 mg daily.  Monitor with increased mobility  3/1- BP controlled this AM and bradycardic due to fitness level- con't regimen  3/9- will stop Avapro- since Cr is slowly rising and BUN OK- will push fluids- but usually 1.1-1.2- up slightly to 1.32, however BP was OK off  Avapro- so will try without and recheck labs Sunday  3/10- d/w pt the plan- BP looks great  3/12- BP running 130s/80s-90s- con't regimen off Avapro, and will have pt's PCP address outpt.  9.  BPH.  Flomax 0.4 mg daily.  Check PVR  3/2- having urgency- however don't want to stop since has significant BPH per wife; also don't want to change to another BPH med- could drop BP too much.   3/7- con't flomax- although has some urgency, also has BPH and pt agrees doesn't want retention.  10.  History of chronic thrombocytopenia.  Platelets 120,000-154000 at baseline.  Follow-up CBC  3/1- Plts 99k- was 102 yesterday- will monitor since on Lovenox  3/3- labs Monday  3/6- Plts 104k- stable -con't regimen 11.  Hyperlipidemia.  Crestor 12.  Recent bilateral carpal tunnel surgeries.  Follow-up outpatient- can use gloves to protect wrists/hands on walker/assistive device 13. Dispo  3/3- pt asking for dietician for d/w pt/wife about dietary changes.   3/6- change diet to heart healthy and get rid of carb modified - pt also can have pass to go outside ot see family.   3/8- doing better on no carb modifications 14. AKI  3/6- Cr 1.28- up from 1.11- will recheck  3/9- Cr up slightly more to 1.32- will stop Avapro and let pt know thinking recheck labs Sunday  3/10- went over with pt.  3/12- Cr stable at 1.30- and BUN better at 19- it's usually in this range, when researched back over 5 years, usually 1.2-1.3- so will  leave for now and not recheck in AM 15. Dispo  3/10- advised family and pt made referral to Neuro- they also want [address] for outpt therapies.   3/12- [facility]pt therapy [facility]   I spent a total of 38   minutes on total care today- >50% coordination of care- due to high level decision making and prolonged d/w pt about d/c plans.        LOS: 12 days A FACE TO FACE EVALUATION WAS PERFORMED  Eddie Calhoun 07/16/2021, 2:38 PM

## 2021-07-16 NOTE — Progress Notes (Signed)
Occupational Therapy Discharge Summary ? ?Patient Details  ?Name: Eddie Calhoun ?MRN: [id number] ?Date of Birth: May 26, 1950 ? ? ? ?Patient has met 8 of 8 long term goals due to improved activity tolerance, improved balance, postural control, ability to compensate for deficits, functional use of  LEFT upper extremity, improved awareness, and improved coordination.  Patient to discharge at overall Modified Independent level.  Patient's care partner is independent to provide the necessary physical assistance at discharge.  Pt is overall Mod I with BADLs including bathing, dressing, and toileting with hurrycane for support. Pt is able to don/doff his own AFO as well. Pt's spouse [name] has been present for family ed for several sessions. Pt has been very motivated and made excellent progress with OT goals. Pt's LUE is overall at 4 to 4+/5 grossly, remaining mild deficits in [facility]. Grip strength significantly improved from 15# initially to 55# at time of DC. ? ?Reasons goals not met: NA ? ?Recommendation:  ?Patient will benefit from ongoing skilled OT services in outpatient setting to continue to advance functional skills in the area of BADL, iADL, and Reduce care partner burden. ? ?Equipment: ?Pt spouse purchased shower seat ? ?Reasons for discharge: treatment goals met and discharge from hospital ? ?Patient/family agrees with progress made and goals achieved: Yes ? ?OT Discharge ?Precautions/Restrictions  ?Precautions ?Precautions: Fall ?Precaution Comments: L hemi, B CTR with R>L sensitivity ?Restrictions ?Weight Bearing Restrictions: No ?Pain ?Pain Assessment ?Pain Scale: 0-10 ?Pain Score: 0-No pain ?ADL ?ADL ?Eating: Independent ?Grooming: Modified independent ?Where Assessed-Grooming: Sitting at sink ?Upper Body Bathing: Modified independent ?Where Assessed-Upper Body Bathing: Sitting at sink ?Lower Body Bathing: Modified independent ?Where Assessed-Lower Body Bathing: Shower ?Upper Body Dressing: Modified  independent (Device) ?Where Assessed-Upper Body Dressing: Sitting at sink ?Lower Body Dressing: Modified independent ?Where Assessed-Lower Body Dressing: Standing at sink ?Toileting: Modified independent ?Where Assessed-Toileting: Toilet ?Toilet Transfer: Modified independent ?Toilet Transfer Method: Ambulating ?Walk-In Shower Transfer: Modified independent ?Vision ?Baseline Vision/History: 1 Wears glasses ?Patient Visual Report: No change from baseline ?Vision Assessment?: No apparent visual deficits ?Perception  ?Perception: Within Functional Limits ?Praxis ?Praxis: Impaired ?Praxis Impairment Details: Motor planning ?Praxis-Other Comments: greatly improved from eval ?Cognition ?Overall Cognitive Status: Within Functional Limits for tasks assessed ?Arousal/Alertness: Awake/alert ?Orientation Level: Oriented X4 ?Year: 2023 ?Month: March ?Day of Week: Correct ?Memory: Appears intact ?Immediate Memory Recall: Sock;Blue;Bed ?Memory Recall Sock: Without Cue ?Memory Recall Blue: Without Cue ?Memory Recall Bed: Without Cue ?Awareness: Appears intact ?Problem Solving: Appears intact ?Reasoning: Appears intact ?Sequencing: Appears intact ?Organizing: Appears intact ?Safety/Judgment: Appears intact ?Sensation ?Sensation ?Light Touch: Appears Intact ?Hot/Cold: Appears Intact ?Proprioception: Appears Intact ?Coordination ?Gross Motor Movements are Fluid and Coordinated: No ?Fine Motor Movements are Fluid and Coordinated: No ?Coordination and Movement Description: L hemi, greatly improved from baseline ?Finger Nose Finger Test: dysmetria, decreased body awareness ?Motor  ?Motor ?Motor: Hemiplegia ?Motor - Skilled Clinical Observations: L hemi plegia ?Mobility  ?Transfers ?Sit to Stand: Independent with assistive device ?Stand to Sit: Independent with assistive device  ?Trunk/Postural Assessment  ?Cervical Assessment ?Cervical Assessment: Within Functional Limits ?Thoracic Assessment ?Thoracic Assessment: Within Functional  Limits ?Lumbar Assessment ?Lumbar Assessment: Within Functional Limits ?Postural Control ?Postural Control: Deficits on evaluation  ?Balance ?Balance ?Balance Assessed: (P) Yes ?Static Sitting Balance ?Static Sitting - Balance Support: Feet supported ?Static Sitting - Level of Assistance: 7: Independent ?Dynamic Sitting Balance ?Dynamic Sitting - Balance Support: Feet supported ?Dynamic Sitting - Level of Assistance: 6: Modified independent (Device/Increase time) ?Dynamic Sitting - Balance Activities: Lateral lean/weight  shifting;Forward lean/weight shifting;Reaching for objects ?Static Standing Balance ?Static Standing - Level of Assistance: 6: Modified independent (Device/Increase time) ?Dynamic Standing Balance ?Dynamic Standing - Balance Support: Bilateral upper extremity supported;During functional activity ?Dynamic Standing - Level of Assistance: 6: Modified independent (Device/Increase time) ?Extremity/Trunk Assessment ?RUE Assessment ?RUE Assessment: Within Functional Limits ?LUE Assessment ?LUE Assessment: Exceptions to [facility] ?General Strength Comments: L hemi but greatly improved from eval, roughly 4/5 throughout ? ? ?Viona Gilmore ?07/16/2021, 10:48 AM ?

## 2021-07-16 NOTE — Progress Notes (Signed)
Occupational Therapy Session Note ? ?Patient Details  ?Name: Eddie Calhoun ?MRN: [id number] ?Date of Birth: Jan 02, 1951 ? ?Today's Date: 07/16/2021 ?OT Individual Time: 6761-9509 ?OT Individual Time Calculation (min): 58 min  ? ? ?Short Term Goals: ?Week 1:  OT Short Term Goal 1 (Week 1): Pt will perform ambulatory toilet transfer with CGA and RW ?OT Short Term Goal 1 - Progress (Week 1): Met ?OT Short Term Goal 2 (Week 1): Pt will perform shower level bathing CGA with AE PRN ?OT Short Term Goal 2 - Progress (Week 1): Met ?OT Short Term Goal 3 (Week 1): Pt will improve LUE to 3+/5 grossly ?OT Short Term Goal 3 - Progress (Week 1): Met ?OT Short Term Goal 4 (Week 1): Pt will perform 3/3 toileting tasks CGA ?OT Short Term Goal 4 - Progress (Week 1): Met ?Week 2:  OT Short Term Goal 1 (Week 2): STG = LTG at Mod I ? ?Skilled Therapeutic Interventions/Progress Updates:  ?  Pt greeted at time of session sitting up in recliner, agreeable to OT session and wanting to take a shower. Aware of grad day, no further concerns at this time. Pt states his AFO is a better fit and has been ambulating well. Pt performing all aspects of ADL routine with Mod I with hurrycane, gathering clothes and items for bathing tasks, performing shower transfer Mod I anterior entry method, performing UB/LB bathing Mod I, mostly seated and some standing portions with good safety awareness. Pt gathered clothes prior to shower, performed UB/LB dress with Mod I as well including donning AFO and shoes, tying laces. Pt made Mod I in the room, sign provided and nursing aware. Re-measured grip strength as well, R hand at 75# and L hand at 55#. Pt set up with call bell in reach all needs met and aware that he is now Mod I in room.  ? ?Therapy Documentation ?Precautions:  ?Precautions ?Precautions: Fall ?Precaution Comments: L hemi, B STR with R>L sensitivity ?Restrictions ?Weight Bearing Restrictions: No ? ? ? ? ?Therapy/Group: Individual Therapy ? ?Viona Gilmore ?07/16/2021, 7:22 AM ?

## 2021-07-16 NOTE — Progress Notes (Signed)
Physical Therapy Session Note ? ?Patient Details  ?Name: Eddie Calhoun ?MRN: [id number] ?Date of Birth: 04-14-1951 ? ?Today's Date: 07/16/2021 ?PT Individual Time: 4268-3419 ?PT Individual Time Calculation (min): 45 min  ? ?Short Term Goals: ?Week 2:  PT Short Term Goal 1 (Week 2): =LTGs d/t ELOS ? ?Skilled Therapeutic Interventions/Progress Updates:  ?  Pt received seated in recliner in room, agreeable to PT session. No complaints of pain. Sit to stand and transfers at mod I level throughout session with use of SPC. Ambulation up to 200 ft at mod I level with use of SPC and L AFO. Ascend/descend 12 x 6" stairs with L handrail and SPC at Supervision level, step-to gait pattern. Car transfer at mod I level with use of SPC. Pt performed TUG and scored and average of 8.76 sec with use of SPC at mod I level, significant improvement from 22 sec during last testing period. Discussed pt's improvement in score and functional implications. Pt left seated in recliner in room with needs in reach at end of session. Pt safe to be mod I in his room, therapy team in agreement. ? ?Therapy Documentation ?Precautions:  ?Precautions ?Precautions: Fall ?Precaution Comments: L hemi, B CTR with R>L sensitivity ?Restrictions ?Weight Bearing Restrictions: No ? ? ? ? ? ?Therapy/Group: Individual Therapy ? ? ?Excell Seltzer, PT, DPT, CSRS ?07/16/2021, 5:03 PM  ?

## 2021-07-17 MED ORDER — VITAMIN D 25 MCG (1000 UNIT) PO TABS: 1000.0000 [IU] | ORAL_TABLET | Freq: Every day | ORAL | 0 refills | Status: AC

## 2021-07-17 MED ORDER — TAMSULOSIN HCL 0.4 MG PO CAPS
0.4000 mg | ORAL_CAPSULE | Freq: Every day | ORAL | 0 refills | Status: DC
Start: 2021-07-17 — End: 2021-12-12

## 2021-07-17 MED ORDER — OLMESARTAN MEDOXOMIL 40 MG PO TABS: 40.0000 mg | ORAL_TABLET | Freq: Every day | ORAL | 0 refills | Status: DC

## 2021-07-17 MED ORDER — VITAMIN B-12 1000 MCG PO TABS: 1000.0000 ug | ORAL_TABLET | Freq: Every day | ORAL | 0 refills | Status: AC

## 2021-07-17 MED ORDER — ROSUVASTATIN CALCIUM 20 MG PO TABS: 20.0000 mg | ORAL_TABLET | Freq: Every day | ORAL | 0 refills | Status: DC

## 2021-07-17 MED ORDER — CLOPIDOGREL BISULFATE 75 MG PO TABS: 75.0000 mg | ORAL_TABLET | Freq: Every day | ORAL | 0 refills | Status: DC

## 2021-07-17 MED ORDER — PANTOPRAZOLE SODIUM 40 MG PO TBEC: 40.0000 mg | DELAYED_RELEASE_TABLET | Freq: Every day | ORAL | 0 refills | Status: DC

## 2021-07-17 NOTE — Progress Notes (Signed)
Pt to discharge to home with personal property. Discharge instructions provided by D. Ruston, [place]. Pt verbalized an understanding of medications, discharge instructions, and follow up appts.  ?

## 2021-07-17 NOTE — Progress Notes (Signed)
Uneventful night. Patient excited about going home. Independent in the room. Reports urinary urgency and frequency, not a new complaint, since being in the hospital. Patrici Ranks A  ?

## 2021-07-17 NOTE — Progress Notes (Signed)
Inpatient Rehabilitation Discharge Medication Review by a Pharmacist ?  ?A complete drug regimen review was completed for this patient to identify any potential clinically significant medication issues. ?  ?High Risk Drug Classes Is patient taking? Indication by Medication  ?Antipsychotic No    ?Anticoagulant No    ?Antibiotic No    ?Opioid No    ?Antiplatelet Yes Plavix, aspirin for CVA  ?Hypoglycemics/insulin No    ?Vasoactive Medication Yes  benicar- hypertension  ?Chemotherapy No    ?Other Yes Protonix for GERD ?Crestor for HLD ?Flomax for BPH  ?  ?  ?  ?Type of Medication Issue Identified Description of Issue Recommendation(s)  ?Drug Interaction(s) (clinically significant) ?       ?Duplicate Therapy ?       ?Allergy ?       ?No Medication Administration End Date ?       ?Incorrect Dose ?       ?Additional Drug Therapy Needed ?       ?Significant med changes from prior encounter (inform family/care partners about these prior to discharge).      ?Other ?       ?  ?  ?Clinically significant medication issues were identified that warrant physician communication and completion of prescribed/recommended actions by midnight of the next day:  No ?  ?Pharmacist comments: None ?  ?Time spent performing this drug regimen review (minutes):  20 minutes ? ?Rhonda Vangieson BS, PharmD, BCPS ?Clinical Pharmacist ?07/17/2021 11:43 AM ? ? ? ? ?

## 2021-07-18 ENCOUNTER — Encounter: Payer: Self-pay | Admitting: Physical Therapy

## 2021-07-18 ENCOUNTER — Ambulatory Visit: Payer: Medicare Other | Admitting: Physical Therapy

## 2021-07-18 ENCOUNTER — Other Ambulatory Visit: Payer: Self-pay

## 2021-07-18 ENCOUNTER — Ambulatory Visit: Payer: Medicare Other | Attending: Physical Medicine and Rehabilitation | Admitting: Occupational Therapy

## 2021-07-18 ENCOUNTER — Encounter: Payer: Self-pay | Admitting: Occupational Therapy

## 2021-07-18 DIAGNOSIS — R278 Other lack of coordination: Secondary | ICD-10-CM

## 2021-07-18 DIAGNOSIS — R2681 Unsteadiness on feet: Secondary | ICD-10-CM | POA: Insufficient documentation

## 2021-07-18 DIAGNOSIS — M6281 Muscle weakness (generalized): Secondary | ICD-10-CM | POA: Diagnosis present

## 2021-07-18 DIAGNOSIS — R270 Ataxia, unspecified: Secondary | ICD-10-CM

## 2021-07-18 DIAGNOSIS — R2689 Other abnormalities of gait and mobility: Secondary | ICD-10-CM | POA: Insufficient documentation

## 2021-07-18 DIAGNOSIS — I69352 Hemiplegia and hemiparesis following cerebral infarction affecting left dominant side: Secondary | ICD-10-CM | POA: Insufficient documentation

## 2021-07-18 NOTE — Therapy (Signed)
?OUTPATIENT PHYSICAL THERAPY NEURO EVALUATION ? ? ?Patient Name: Eddie Calhoun ?MRN: [id number] ?DOB:04-08-1951, 71 y.o., male ?Today's Date: 07/18/2021 ? ?PCP: Josetta Huddle, MD ?REFERRING PROVIDER: Courtney Heys, MD  ? ? PT End of Session - 07/18/21 1317   ? ? Visit Number 1   ? Number of Visits 13   12+eval  ? Date for PT Re-Evaluation 09/15/21   ? Authorization Type Medicare & Aarp Supplement  Covered 100%  VL: Follows Medicare guidelines-10th visit PN   ? Progress Note Due on Visit 10   ? PT Start Time 1320   Pt received from OT eval  ? PT Stop Time 1405   ? PT Time Calculation (min) 45 min   ? Equipment Utilized During Treatment Gait belt   Pt's SPC w/ quad tip and L AFO  ? Activity Tolerance Patient tolerated treatment well   ? Behavior During Therapy [facility] for tasks assessed/performed   ? ?  ?  ? ?  ? ? ?Past Medical History:  ?Diagnosis Date  ? Allergy   ? allergic rhinitis  ? BPH (benign prostatic hyperplasia)   ? Elevated prostate specific antigen (PSA)   ? Hypertension   ? Plantar fasciitis of right foot   ? Pneumonia   ? Thrombocytopenia, unspecified ([place]) 07/26/2012  ? 03/24/12  129,000!  ? ?History reviewed. No pertinent surgical history. ?Patient Active Problem List  ? Diagnosis Date Noted  ? 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 Acute ischemic stroke ([place]) 07/01/2021  ? Thrombocytopenia, unspecified ([place]) 07/26/2012  ? ? ?ONSET DATE: 07/14/2021  ? ?REFERRING DIAG: I63.9 (ICD-10-CM) - CVA (cerebral vascular accident) ([place])  ? ?THERAPY DIAG:  ?Other abnormalities of gait and mobility ? ?Muscle weakness (generalized) ? ?Unsteadiness on feet ? ?Ataxia ? ?Other lack of coordination ? ?SUBJECTIVE:  ?                                                                                                                                                                                           ? ?SUBJECTIVE STATEMENT: ?Pt states he had a good experience in inpatient rehab and he is trying to get back to his regular  routine.  He feels he is having most difficulty with the speed of doing things.  He is experiencing tightness in his LUE and his L leg and he has no ability to DF foot and relies on his left AFO brace. ?Pt accompanied by: self ? ?PERTINENT HISTORY: Admitted to ED 07/01/2021 for R CVA w/ Left hemiparesis.  He had carpal tunnel surgery in early February 2023.  Discharged from inpatient rehab 07/17/2021.  ?PMH:  chronic  thrombocytopenia, BPH, hyperlipidemia, HN, AKI ? ? ?PAIN:  ?Are you having pain? No ? ?PRECAUTIONS: Fall ? ?WEIGHT BEARING RESTRICTIONS No ? ?FALLS: Has patient fallen in last 6 months? No, Number of falls: 0 ? ?LIVING ENVIRONMENT: ?Lives with: lives with their family and lives with their spouse-Kathie ?Lives in: House/apartment ?Stairs: Yes; Internal: 14 steps; on left going up and pt states he has 3 levels in home, lives on main level with basement area and frequented upstairs with living room area.  Stairs inside are carpeted and split by a landing. and External: 1 step into door steps; none-uses wall or doorframe; garage entrance which is what pt states he uses most frequently ?Has following equipment at home: Quad cane small base-SPC w/ quad tip ? ?PLOF: Independent ? ?PATIENT GOALS  "I would like to get back to as close to where I was as possible" ? 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 ? ?OBJECTIVE:  ?BP at onset of session:  142/92 on RUE in sitting EOM ? ?DIAGNOSTIC FINDINGS: MRI 07/02/2021 showed Acute perforator infarct at the right basal ganglia and corona radiata. ? ?COGNITION: ?Overall cognitive status: Within functional limits for tasks assessed ?  ?SENSATION: ?Light touch: WFL ?Proprioception: WFL ? ?COORDINATION: ?Heel-to-shin:  left w/ inc ataxia with inc speed of movement and dec ROM at hip ?Fingertip opposition:  LUE limited d/t tightness in thenar eminence; pt states from CT surgery ?LE RAMPS:  Unable to accurately assess d/t L DF  weakness ? ? ?EDEMA:  ?Pt reports he gets mild swelling in bilateral ankles intermittently, he wore compression socks ? ?MUSCLE TONE: WFL BLE ? ? ?MUSCLE LENGTH: ?Hamstrings: Right 70 deg; Left 60 deg ? ? ?POSTURE: No Significant postural limitations ? ?LE ROM:    ? ?Passive  Right ?07/18/2021 Left ?07/18/2021  ?Hip flexion [facility] WFL  ?Hip extension    ?Hip abduction [facility] WFL  ?Hip adduction    ?Hip internal rotation [facility] WFL  ?Hip external rotation [facility] WFL  ?Knee flexion [facility] WFL  ?Knee extension [facility] WFL;  mild clicking during extension  ?Ankle dorsiflexion WFL -3 deg  ?Ankle plantarflexion [facility] WFL  ?Ankle inversion    ?Ankle eversion    ? (Blank rows = not tested) ? ?MMT:   ? ?MMT Right ?07/18/2021 Left ?07/18/2021  ?Hip flexion 4+/5 3+/5  ?Hip extension    ?Hip abduction 4+/5 3+/5  ?Hip adduction 5/5 4-/5  ?Hip internal rotation    ?Hip external rotation    ?Knee flexion 4+/5 3/5  ?Knee extension 5/5 3+/5  ?Ankle dorsiflexion 5/5 2/5  ?Ankle plantarflexion 4+/5 2-/5  ?Ankle inversion    ?Ankle eversion    ?(Blank rows = not tested) ? ?BED MOBILITY:  ?Sit to supine Complete Independence ?Supine to sit Complete Independence ? ?TRANSFERS: ?Assistive device utilized: Quad cane small base  ?Sit to stand: Modified independence ?Stand to sit: Modified independence ?Chair to chair: Modified independence ? ?GAIT: ?Gait pattern: step through pattern, decreased ankle dorsiflexion- Left, Left steppage, Left foot flat, scissoring, ataxic, trendelenburg, and narrow BOS ?Distance walked: 57' ?Assistive device utilized: Quad cane small base and L AFO ?Level of assistance: SBA ?Comments: Pt ambulates with increased speed with worsening ataxia of the LLE as gait distance progressed.  Pt has mild scissoring of the LLE during limb advancement.  He ambulates with cane on R. ? ?FUNCTIONAL TESTs:  ?5 times sit to stand: 15.15sec ?Timed up and go (TUG): 9.96sec very ataxic  w/ inc speed ?2 minute walk test: 40' w/ SPC w/ quad tip and L AFO ?10  meter walk test: 2.78 ft/sec OR 0.84 m/sec ?Functional gait assessment: to be assessed next visit ? ? ? ? ?PATIENT EDUCATION: ?Education details: Pt edu on PT POC, assessment interpretations, and goals. ?Person educated: Patient ?Education method: Explanation ?Education comprehension: verbalized understanding ? ? ?HOME EXERCISE PROGRAM: ?To be initiated next visit. ? ?ASSESSMENT: ? ?CLINICAL IMPRESSION: ?Patient is a 71 y.o. male who was seen today for physical therapy evaluation and treatment for left hemiplegia following acute perforator infarct at the right basal ganglia and corona radiata.  Pt has a significant PMH of chronic thrombocytopenia, BPH, hyperlipidemia, HN, AKI.  Identified impairments include Left sided weakness, ataxia, decreased coordination, abnormal gait, poor balance.  Evaluation via the following assessment tools: 5xSTS and 10MWT indicate fall risk.  While timing of the TUG as well as the distance ambulated with 2MWT do not indicate fall risk based on speed the patient is increasingly ataxic with increased pace and requires increased cuing to slow down for safety which elevates his risk of falls.  FGA to be assessed at follow-up visit.  They would benefit from skilled PT to address impairments as noted and progress towards long term goals. ? ? ? ?OBJECTIVE IMPAIRMENTS Abnormal gait, decreased balance, decreased coordination, decreased knowledge of use of DME, difficulty walking, decreased ROM, decreased strength, hypomobility, and impaired flexibility.  ? ?ACTIVITY LIMITATIONS cleaning, community activity, driving, laundry, yard work, and hobbies: golfing and bowling .  ? ?PERSONAL FACTORS Age, Past/current experiences, and 3+ comorbidities:  chronic thrombocytopenia, BPH, hyperlipidemia, HN, AKI  are also affecting patient's functional outcome.  ? ? ?REHAB POTENTIAL: Excellent ? ?CLINICAL DECISION MAKING: Stable/uncomplicated ? ?EVALUATION COMPLEXITY: Low ? ? ?GOALS: ?Goals reviewed with  patient? Yes ? ?SHORT TERM GOALS: Target date:  08/18/2021 ? ?Pt will be maintain initial strength and balance HEP with supervision from family. ?Baseline: Not initiated on eval. ?Goal status: INITIAL ? ?2.  Pt will

## 2021-07-18 NOTE — Therapy (Signed)
[facility] ?[place], [name], [address] ?Phone: [phone]   Fax:  [phone] ? ?Occupational Therapy Evaluation ? ?Patient Details  ?Name: Eddie Calhoun ?MRN: [id number] ?Date of Birth: 04-14-1951 ?Referring Provider (OT): Lovorn ? ? ?Encounter Date: 07/18/2021 ? ? OT End of Session - 07/18/21 1357   ? ? Visit Number 1   ? Number of Visits 17   ? Date for OT Re-Evaluation 10/01/21   ? Authorization Type Medicare and AARP supplement   ? OT Start Time 1230   ? OT Stop Time 1315   ? OT Time Calculation (min) 45 min   ? Activity Tolerance Patient tolerated treatment well   ? Behavior During Therapy [facility] for tasks assessed/performed   ? ?  ?  ? ?  ? ? ?Past Medical History:  ?Diagnosis Date  ? Allergy   ? allergic rhinitis  ? BPH (benign prostatic hyperplasia)   ? Elevated prostate specific antigen (PSA)   ? Hypertension   ? Plantar fasciitis of right foot   ? Pneumonia   ? Thrombocytopenia, unspecified ([place]) 07/26/2012  ? 03/24/12  129,000!  ? ? ?History reviewed. No pertinent surgical history. ? ?There were no vitals filed for this visit. ? ? Subjective Assessment - 07/18/21 1237   ? ? Subjective  I am left handed - had the stroke while I was reading   ? Currently in Pain? No/denies   ? Pain Score 0-No pain   ? ?  ?  ? ?  ? ? ? ? [facility] OT Assessment - 07/18/21 0001   ? ?  ? Assessment  ? Medical Diagnosis corona radiata, right basal ganglia   ? Referring Provider (OT) Lovvorn   ? Onset Date/Surgical Date 07/01/21   ? Hand Dominance Left   ? Next MD Visit 07/28/21 - PM&R   ? Prior Therapy [facility] 2/28-3/13/23   ?  ? Precautions  ? Precautions Fall   No driving  ?  ? Balance Screen  ? Has the patient fallen in the past 6 months No   ?  ? Prior Function  ? Level of Independence Independent with basic ADLs;Independent with household mobility without device;Independent with homemaking with ambulation   ? Vocation Retired   ? Leisure gardening, cooking,  exercise - walking, pet care - cats, housekeeping, entertain   ?  ? ADL  ? Eating/Feeding --   increased time  ? Grooming --   on blood thinner - increased time  ? Upper Body Bathing Minimal assistance   right shoulder  ? Lower Body Bathing Modified independent   ? Upper Body Dressing Increased time   ? Lower Body Dressing Increased time   ? Toilet Transfer Modified independent   ? Toileting - Clothing Manipulation Modified independent   ? Tub/Shower Engineer, petroleum with back;Grab bars   ?  ? Written Expression  ? Dominant Hand Left   ? Handwriting 75% legible   ?  ? Vision - History  ? Baseline Vision Wears contact   ?  ? Vision Assessment  ? Eye Alignment Within Functional Limits   ? Ocular Range of Motion Within Functional Limits   ? Comment Wears contacts   ?  ? Activity Tolerance  ? Activity Tolerance Comments Patient was very active prior to stroke   ?  ? Cognition  ? Overall Cognitive Status Within Functional Limits for tasks assessed   ? Cognition Comments No apparent cognitive deficits noted   ?  ?  Sensation  ? Light Touch Appears Intact   ? Stereognosis Not tested   ? Hot/Cold Not tested   ? Proprioception Not tested   ? Additional Comments Recent CTS - R in Feb, L- in Dec.  HAS NUMBNESS TIP OF 3RD DIGIT LEFT   ?  ? Coordination  ? Gross Motor Movements are Fluid and Coordinated No   ? Fine Motor Movements are Fluid and Coordinated No   ? Coordination and Movement Description Ataxic; more pronounced in open chain   ? Finger Nose Finger Test dysmetric ataxic like movement   ? 9 Hole Peg Test Right;Left   ? Right 9 Hole Peg Test 20.81   ? Left 9 Hole Peg Test 27.91   ?  ? Perception  ? Perception Within Functional Limits   ?  ? Praxis  ? Praxis Intact   ?  ? Tone  ? Assessment Location Left Upper Extremity   ?  ? ROM / Strength  ? AROM / PROM / Strength AROM;Strength   ?  ? AROM  ? Overall AROM  Within functional limits for tasks performed   ? Overall AROM Comments Has full range of motion but  lacks coordination   ?  ? Strength  ? Overall Strength Comments Has 4+/5 strength BUE   ?  ? Hand Function  ? Right Hand Gross Grasp Functional   ? Right Hand Grip (lbs) 85.0   ? Right Hand Lateral Pinch 23 lbs   ? Left Hand Gross Grasp Functional   ? Left Hand Grip (lbs) 55.7   ? Left Hand Lateral Pinch 19 lbs   ?  ? LUE Tone  ? LUE Tone Mild;Hypertonic   ? ?  ?  ? ?  ? ? ? ? ? ? ? ? ? ? ? ? ? ? ? ? ? ? ? OT Education - 07/18/21 1357   ? ? Education Details Results of OT Evaluation and potential goal.   ? Person(s) Educated Patient   ? Methods Explanation   ? Comprehension Verbalized understanding   ? ?  ?  ? ?  ? ? ? OT Short Term Goals - 07/18/21 1515   ? ?  ? OT SHORT TERM GOAL #1  ? Title Patient will complete an HEP designed to improve coordination in LUE   ? Time 4   ? Period Weeks   ? Status New   ? Target Date 09/01/21   ?  ? OT SHORT TERM GOAL #2  ? Title Patient will demonstrate ability to heat and serve simple warm meal for he and his wife with intermittent support   ? Time 4   ? Period Weeks   ? Status New   ?  ? OT SHORT TERM GOAL #3  ? Title Patient will feed himself with appropriate use of utensils in 30% less time without increased spills/drops   ? Time 4   ? Period Weeks   ? Status New   ?  ? OT SHORT TERM GOAL #4  ? Title Patient will report improved effectiveness and efficiency with brushing his teeth using dominant LUE   ? Time 4   ? Period Weeks   ? Status New   ? ?  ?  ? ?  ? ? ? ? OT Long Term Goals - 07/18/21 1520   ? ?  ? OT LONG TERM GOAL #1  ? Title Patient will complete Home Activity Program designed to improve his ability to  use LUE as dominant side again.   ? Time 8   ? Period Weeks   ? Status New   ? Target Date 10/01/21   ?  ? OT LONG TERM GOAL #2  ? Title Patient will demonstrate ability to putt a golf ball with BUE with 75% accuracy   ? Time 8   ? Period Weeks   ? Status New   ?  ? OT LONG TERM GOAL #3  ? Title Patient will be able to swing a golf club through 50% range and control  in preparation for full swing   ? Time 8   ? Period Weeks   ? Status New   ?  ? OT LONG TERM GOAL #4  ? Title Patient will demonstrate awareness of return to driving recommendations   ? Time 8   ? Period Weeks   ? Status New   ?  ? OT LONG TERM GOAL #5  ? Title Patient will return to gardening - weeding, raking, hoeing with intermittent assistance   ? Time 8   ? Period Weeks   ? Status New   ?  ? Long Term Additional Goals  ? Additional Long Term Goals Yes   ?  ? OT LONG TERM GOAL #6  ? Title Patient will prepare and cook full meal for he and his wife - includes food prep - peeling, chopping, as well as cooking, and clean up.   ? Time 8   ? Period Weeks   ? Status New   ? ?  ?  ? ?  ? ? ? ? ? ? ? ? Plan - 07/18/21 1459   ? ? Clinical Impression Statement Patient is a 71 yr old man, retired since 2018, with recent Bilateral carpal tunnel releases - Left 12/22, Right 2/23 - well healed.  PMH significant for HTN, HYperlipidemia, and BPH.  Patient suffered an acute lacunar  infarct - basal ganglia / corona radiata on 07/01/21, and was emergently transferred to [facility] where he was met by stroke team, and given IV TNKase within 9 min of arrival.  Patient received [facility]patient [facility]hab Program from 2/28-3/13 - when he was discharged home with his wife.  Patient presents to OT evaluation walking with cane.  Patient has active range of motion throughout BUE, although movement of dominant LUE is notably ataxic, and he reports difficulty using LUE functionally for feeding, shaving, cooking, and reports challenge with functional mobility to allow him to return to activities such as walking, housekeeping, cooking, gardening.  Patient will benefit from skilled OT intervention to improve his ability to safely return to driving and prior level of functioning with IADL.   ? OT Occupational Profile and History Detailed Assessment- Review of Records and additional review of physical, cognitive, psychosocial history related to  current functional performance   ? Occupational performance deficits (Please refer to evaluation for details): ADL's;IADL's;Leisure   ? Body Structure / Function / Physical Skills ADL;Decreased knowled

## 2021-07-19 ENCOUNTER — Telehealth: Payer: Self-pay

## 2021-07-19 NOTE — Addendum Note (Signed)
Addended by: Elease Etienne B on: 07/19/2021 08:36 AM ? ? Modules accepted: Orders ? ?

## 2021-07-19 NOTE — Telephone Encounter (Signed)
Transition Care Management Unsuccessful Follow-up Telephone Call ? ?Date of discharge and from where:  07/17/21 [facility] ? ?Attempts:  2nd Attempt ? ?Reason for unsuccessful TCM follow-up call:  Left voice message ? ?  ?

## 2021-07-19 NOTE — Telephone Encounter (Signed)
Transition Care Management Unsuccessful Follow-up Telephone Call ? ?Date of discharge and from where:  07/17/21 from [facility] ? ?Attempts:  1st Attempt ? ?Reason for unsuccessful TCM follow-up call:  Left voice message ? ?  ?

## 2021-07-20 NOTE — Telephone Encounter (Signed)
Transition Care Management Unsuccessful Follow-up Telephone Call ? ?Date of discharge and from where:  07/17/21 from [facility] ? ?Attempts:  3rd Attempt ? ?Reason for unsuccessful TCM follow-up call:  Left voice message ? ?  ?

## 2021-07-25 ENCOUNTER — Other Ambulatory Visit: Payer: Self-pay

## 2021-07-25 ENCOUNTER — Ambulatory Visit: Payer: Medicare Other | Admitting: Occupational Therapy

## 2021-07-25 ENCOUNTER — Ambulatory Visit: Payer: Medicare Other | Admitting: Physical Therapy

## 2021-07-25 DIAGNOSIS — I69352 Hemiplegia and hemiparesis following cerebral infarction affecting left dominant side: Secondary | ICD-10-CM | POA: Diagnosis not present

## 2021-07-25 DIAGNOSIS — R278 Other lack of coordination: Secondary | ICD-10-CM

## 2021-07-25 DIAGNOSIS — R270 Ataxia, unspecified: Secondary | ICD-10-CM

## 2021-07-25 NOTE — Patient Instructions (Signed)
?  Coordination Activities ? ?Perform the following activities for 10-15 minutes 1-2 times per day with left hand(s). ? ?Toss ball between hands. ?Toss ball in air and catch with the same hand. ?Rotate ball in fingertips clockwise x3 and counterclockwise x3  ?Flip cards 1 at a time as fast as you can. ?Deal cards with your thumb (Hold deck in hand and push card off top with thumb). ?Rotate one card in hand (clockwise and counter-clockwise). ?Shuffle cards. ?Pick up coins one at a time until you get 5 in your hand, then move coins from palm to fingertips to stack 1 at a time. (3 stacks of 5) ?Practice writing and/or typing. ? ?Scapular Retraction (Prone) ? ? ?Lie with arms at sides. Pinch shoulder blades together and down towards bottom. ?Repeat _10___ times per set. Do _12___ sets per session. Do _2___ sessions per day. ? ? ?Scapular Retraction: Abduction (Prone) ? ? ? ?Lie with upper arms straight out from sides, elbows bent to 90?Marland Kitchen Pinch shoulder blades together and raise arms a few inches from floor. ?Repeat _10___ times per set. Do _10___ sets per session. Do _1-2___ sessions per day. ? ?On Elbows (Prone) ? ? ? ?Rise up on elbows as high as possible, keeping hips on floor. Hold _5___ seconds. ?Repeat _10___ times per set. Do __1__ sets per session. Do _1-2___ sessions per day. ? ?Cranial Flexion: Overhead Arm Extension - Supine ([facility] [place]) ? ? ? ?Lie with knees bent, arms beyond head, holding _beach ball. Pull ball up to above face. ?Repeat _10___ times per set. Do __1__ sets per session. Do __1-2__ sessions per day ? ? ? ? ? ? ? ? ? ? ?

## 2021-07-25 NOTE — Therapy (Addendum)
[name] ?[name], [name], [address] ?Phone: [phone]   Fax:  [phone] ? ?Occupational Therapy Treatment ? ?Patient Details  ?Name: Eddie Calhoun ?MRN: [id number] ?Date of Birth: 1950-05-10 ?Referring Provider (OT): Lovvorn ? ? ?Encounter Date: 07/25/2021 ? ? OT End of Session - 07/25/21 1240   ? ? Visit Number 2   ? Number of Visits 17   ? Date for OT Re-Evaluation 10/01/21   ? Authorization Type Medicare and AARP supplement   ? OT Start Time 1015   ? OT Stop Time 1100   ? OT Time Calculation (min) 45 min   ? Activity Tolerance Patient tolerated treatment well   ? Behavior During Therapy [facility] for tasks assessed/performed   ? ?  ?  ? ?  ? ? ?Past Medical History:  ?Diagnosis Date  ? Allergy   ? allergic rhinitis  ? BPH (benign prostatic hyperplasia)   ? Elevated prostate specific antigen (PSA)   ? Hypertension   ? Plantar fasciitis of right foot   ? Pneumonia   ? Thrombocytopenia, unspecified ([name]) 07/26/2012  ? 03/24/12  129,000!  ? ? ?No past surgical history on file. ? ?There were no vitals filed for this visit. ? ? Subjective Assessment - 07/25/21 1018   ? ? Subjective  I am left handed - had the stroke while I was reading   ? Pertinent History Rt BG infarct 07/01/21. PMH: HTN, HLD   ? Limitations Fall risk, no driving   ? Currently in Pain? No/denies   ? ?  ?  ? ?  ? ? ? ? ? ?See below and patient instructions for details.  ? ?Pt also had questions re: exercises for gym - discussed briefly, but therapist recommended deferring some ex's until he has progressed further in therapy. Pt told to avoid overhead press and pects "fly" machine for now. Pt also asked about leg exercises - pt was instructed to ask P.T. about those ? ? ? ? ? ? ? ? ? ? ? ? ? ? ? OT Education - 07/25/21 1239   ? ? Education Details coordination HEP, shoulder and scapula HEP   ? Person(s) Educated Patient   ? Methods Explanation;Demonstration;Verbal cues;Handout    ? Comprehension Verbalized understanding;Returned demonstration   ? ?  ?  ? ?  ? ? ? OT Short Term Goals - 07/25/21 1241   ? ?  ? OT SHORT TERM GOAL #1  ? Title Patient will complete an HEP designed to improve coordination in LUE   ? Time 4   ? Period Weeks   ? Status On-going   ? Target Date 09/01/21   ?  ? OT SHORT TERM GOAL #2  ? Title Patient will demonstrate ability to heat and serve simple warm meal for he and his wife with intermittent support   ? Time 4   ? Period Weeks   ? Status New   ?  ? OT SHORT TERM GOAL #3  ? Title Patient will feed himself with appropriate use of utensils in 30% less time without increased spills/drops   ? Time 4   ? Period Weeks   ? Status New   ?  ? OT SHORT TERM GOAL #4  ? Title Patient will report improved effectiveness and efficiency with brushing his teeth using dominant LUE   ? Time 4   ? Period Weeks   ? Status New   ? ?  ?  ? ?  ? ? ? ?  OT Long Term Goals - 07/18/21 1520   ? ?  ? OT LONG TERM GOAL #1  ? Title Patient will complete Home Activity Program designed to improve his ability to use LUE as dominant side again.   ? Time 8   ? Period Weeks   ? Status New   ? Target Date 10/01/21   ?  ? OT LONG TERM GOAL #2  ? Title Patient will demonstrate ability to putt a golf ball with BUE with 75% accuracy   ? Time 8   ? Period Weeks   ? Status New   ?  ? OT LONG TERM GOAL #3  ? Title Patient will be able to swing a golf club through 50% range and control in preparation for full swing   ? Time 8   ? Period Weeks   ? Status New   ?  ? OT LONG TERM GOAL #4  ? Title Patient will demonstrate awareness of return to driving recommendations   ? Time 8   ? Period Weeks   ? Status New   ?  ? OT LONG TERM GOAL #5  ? Title Patient will return to gardening - weeding, raking, hoeing with intermittent assistance   ? Time 8   ? Period Weeks   ? Status New   ?  ? Long Term Additional Goals  ? Additional Long Term Goals Yes   ?  ? OT LONG TERM GOAL #6  ? Title Patient will prepare and cook full  meal for he and his wife - includes food prep - peeling, chopping, as well as cooking, and clean up.   ? Time 8   ? Period Weeks   ? Status New   ? ?  ?  ? ?  ? ? ? ? ? ? ? ? Plan - 07/25/21 1241   ? ? Clinical Impression Statement Pt is tolerating HEP issued today well. Pt requires cues at times for redirection as pt can become tangential.   ? OT Occupational Profile and History Detailed Assessment- Review of Records and additional review of physical, cognitive, psychosocial history related to current functional performance   ? Occupational performance deficits (Please refer to evaluation for details): ADL's;IADL's;Leisure   ? Body Structure / Function / Physical Skills ADL;Decreased knowledge of use of DME;Balance;Dexterity;GMC;Tone;Body mechanics;UE functional use;IADL;Coordination;Flexibility;Mobility;FMC   ? Rehab Potential Excellent   ? Clinical Decision Making Limited treatment options, no task modification necessary   ? Comorbidities Affecting Occupational Performance: May have comorbidities impacting occupational performance   ? Modification or Assistance to Complete Evaluation  No modification of tasks or assist necessary to complete eval   ? OT Frequency 2x / week   ? OT Duration 8 weeks   ? OT Treatment/Interventions Self-care/ADL training;Balance training;Aquatic Therapy;Therapeutic activities;Therapeutic exercise;Neuromuscular education;Functional Mobility Training;Patient/family education;DME and/or AE instruction   ? Plan review HEP, continue coordination and proximal strengthening   ? Consulted and Agree with Plan of Care Patient   ? ?  ?  ? ?  ? ? ?Patient will benefit from skilled therapeutic intervention in order to improve the following deficits and impairments:   ?Body Structure / Function / Physical Skills: ADL, Decreased knowledge of use of DME, Balance, Dexterity, GMC, Tone, Body mechanics, UE functional use, IADL, Coordination, Flexibility, Mobility, FMC ?  ?  ? ? ?Visit Diagnosis: ?Other  lack of coordination ? ?Hemiplegia and hemiparesis following cerebral infarction affecting left dominant side ([place]) ? ?Ataxia ? ? ? ?Problem List ?  Patient Active Problem List  ? Diagnosis Date Noted  ? 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 Acute ischemic stroke ([place]) 07/01/2021  ? Thrombocytopenia, unspecified ([place]) 07/26/2012  ? ? ?Carey Bullocks, OTR/L ?07/25/2021, 12:43 PM ? ?[place] ?[address] ?[place], [name], [address] ?Phone: [phone]   Fax:  [phone] ? ?Name: PAXTYN BOYAR ?MRN: [id number] ?Date of Birth: [birth year] ? ?

## 2021-07-27 ENCOUNTER — Other Ambulatory Visit: Payer: Self-pay

## 2021-07-27 ENCOUNTER — Ambulatory Visit: Payer: Medicare Other | Admitting: Occupational Therapy

## 2021-07-27 ENCOUNTER — Encounter: Payer: Self-pay | Admitting: Physical Therapy

## 2021-07-27 ENCOUNTER — Encounter: Payer: Self-pay | Admitting: Occupational Therapy

## 2021-07-27 ENCOUNTER — Ambulatory Visit: Payer: Medicare Other | Admitting: Physical Therapy

## 2021-07-27 DIAGNOSIS — R2681 Unsteadiness on feet: Secondary | ICD-10-CM

## 2021-07-27 DIAGNOSIS — R2689 Other abnormalities of gait and mobility: Secondary | ICD-10-CM

## 2021-07-27 DIAGNOSIS — I69352 Hemiplegia and hemiparesis following cerebral infarction affecting left dominant side: Secondary | ICD-10-CM | POA: Diagnosis not present

## 2021-07-27 DIAGNOSIS — R278 Other lack of coordination: Secondary | ICD-10-CM

## 2021-07-27 DIAGNOSIS — M6281 Muscle weakness (generalized): Secondary | ICD-10-CM

## 2021-07-27 NOTE — Therapy (Signed)
?OUTPATIENT PHYSICAL THERAPY TREATMENT NOTE ? ? ?Patient Name: AKONI PARTON ?MRN: [id number] ?DOB:[birth year], 71 y.o., male ?Today's Date: 07/28/2021 ? ?PCP: Josetta Huddle, MD ?REFERRING PROVIDER: Josetta Huddle, MD ? ? PT End of Session - 07/27/21 1058   ? ? Visit Number 2   ? Number of Visits 13   12+eval  ? Date for PT Re-Evaluation 09/15/21   ? Authorization Type Medicare & Aarp Supplement  Covered 100%  VL: Follows Medicare guidelines-10th visit PN   ? Progress Note Due on Visit 10   ? PT Start Time 1058   ? PT Stop Time 1150   ? PT Time Calculation (min) 52 min   ? Equipment Utilized During Treatment Gait belt   Pt's SPC w/ quad tip and L AFO  ? Activity Tolerance Patient tolerated treatment well   ? Behavior During Therapy [facility] for tasks assessed/performed   ? ?  ?  ? ?  ? ? ?Past Medical History:  ?Diagnosis Date  ? Allergy   ? allergic rhinitis  ? BPH (benign prostatic hyperplasia)   ? Elevated prostate specific antigen (PSA)   ? Hypertension   ? Plantar fasciitis of right foot   ? Pneumonia   ? Thrombocytopenia, unspecified ([place]) 07/26/2012  ? 03/24/12  129,000!  ? ?History reviewed. No pertinent surgical history. ?Patient Active Problem List  ? Diagnosis Date Noted  ? 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 Acute ischemic stroke ([place]) 07/01/2021  ? Thrombocytopenia, unspecified ([place]) 07/26/2012  ? ? ?REFERRING DIAG: I63.9 (ICD-10-CM) - CVA (cerebral vascular accident) ([place])  ? ?THERAPY DIAG:  ?Other abnormalities of gait and mobility ? ?Muscle weakness (generalized) ? ?Other lack of coordination ? ?PERTINENT HISTORY: Admitted to ED 07/01/2021 for R CVA w/ Left hemiparesis.  He had carpal tunnel surgery in early February 2023.  Discharged from inpatient rehab 07/17/2021.  ?PMH:  chronic thrombocytopenia, BPH, hyperlipidemia, HN, AKI ? ?PRECAUTIONS: Fall ? ?SUBJECTIVE: Pt states he has been trying to use reciprocal steps to go upstairs at home.  He has been walking without cane some, no falls.  He has been  playing cornhole without holding onto anything. ? ?PAIN:  ?Are you having pain? No-Low back is tight from sleeping on it wrong ? ?TODAY'S TREATMENT: ?FGA Assessed: see functional assessment section of objective. ?Ankle mobility assessed with hypomobility of L ankle noted.  Continued to manual mobilization of talocrural joint grade 3/4 AP glides, pt tolerated pressure w/o noted pain.  Progressed to supine mobilization w/ movement AP glide of talocrural joint with pt attempting to activated DF into available ROM.  1x10 AAROM into DF w/ therapist single finger assist into full ROM.  Improved ROM with each repetition.  Pt performs supine ankle pumps with good activation of L DF 2x10. ?Initiated HEP:  see pt instructions for details. ? ?OBJECTIVE:  ?BP from eval at onset of session:  142/92 on RUE in sitting EOM ?  ?DIAGNOSTIC FINDINGS: MRI 07/02/2021 showed Acute perforator infarct at the right basal ganglia and corona radiata. ?  ?COGNITION: ?Overall cognitive status: Within functional limits for tasks assessed ?            ?SENSATION: ?Light touch: WFL ?Proprioception: WFL ?  ?COORDINATION: ?Heel-to-shin:  left w/ inc ataxia with inc speed of movement and dec ROM at hip ?Fingertip opposition:  LUE limited d/t tightness in thenar eminence; pt states from CT surgery ?LE RAMPS:  Unable to accurately assess d/t L DF weakness ?  ?  ?EDEMA:  ?Pt  reports he gets mild swelling in bilateral ankles intermittently, he wore compression socks ?  ?MUSCLE TONE: WFL BLE ?  ?  ?MUSCLE LENGTH: ?Hamstrings: Right 70 deg; Left 60 deg ?  ?  ?POSTURE: No Significant postural limitations ?  ?LE ROM:    ?  ?Passive  Right ?07/18/2021 Left ?07/18/2021  ?Hip flexion [facility] WFL  ?Hip extension      ?Hip abduction [facility] WFL  ?Hip adduction      ?Hip internal rotation [facility] WFL  ?Hip external rotation [facility] WFL  ?Knee flexion [facility] WFL  ?Knee extension [facility] WFL;  mild clicking during extension  ?Ankle dorsiflexion WFL -3 deg  ?Ankle plantarflexion [facility] WFL   ?Ankle inversion      ?Ankle eversion      ? (Blank rows = not tested) ?  ?MMT:   ?  ?MMT Right ?07/18/2021 Left ?07/18/2021  ?Hip flexion 4+/5 3+/5  ?Hip extension      ?Hip abduction 4+/5 3+/5  ?Hip adduction 5/5 4-/5  ?Hip internal rotation      ?Hip external rotation      ?Knee flexion 4+/5 3/5  ?Knee extension 5/5 3+/5  ?Ankle dorsiflexion 5/5 2/5  ?Ankle plantarflexion 4+/5 2-/5  ?Ankle inversion      ?Ankle eversion      ?(Blank rows = not tested) ?  ?BED MOBILITY:  ?Sit to supine [place] ?Supine to sit [place] ?  ?TRANSFERS: ?Assistive device utilized: Quad cane small base  ?Sit to stand: Modified independence ?Stand to sit: Modified independence ?Chair to chair: Modified independence ?  ?GAIT: ?Gait pattern: step through pattern, decreased ankle dorsiflexion- Left, Left steppage, Left foot flat, scissoring, ataxic, trendelenburg, and narrow BOS ?Distance walked: 57' ?Assistive device utilized: Quad cane small base and L AFO ?Level of assistance: SBA ?Comments: Pt ambulates with increased speed with worsening ataxia of the LLE as gait distance progressed.  Pt has mild scissoring of the LLE during limb advancement.  He ambulates with cane on R. ?  ?FUNCTIONAL TESTs:  ?5 times sit to stand: 15.15sec ?Timed up and go (TUG): 9.96sec very ataxic w/ inc speed ?2 minute walk test: 50' w/ SPC w/ quad tip and L AFO ?10 meter walk test: 2.78 ft/sec OR 0.84 m/sec ?Functional gait assessment: assessed 07/27/2021 ? [facility] PT Assessment - 07/27/21 1118   ? ?  ? Functional Gait  Assessment  ? Gait assessed  Yes   ? Gait Level Surface Walks 20 ft in less than 7 sec but greater than 5.5 sec, uses assistive device, slower speed, mild gait deviations, or deviates 6-10 in outside of the 12 in walkway width.   ? Change in Gait Speed Able to smoothly change walking speed without loss of balance or gait deviation. Deviate no more than 6 in outside of the 12 in walkway width.   ? Gait with Horizontal Head  Turns Performs head turns smoothly with slight change in gait velocity (eg, minor disruption to smooth gait path), deviates 6-10 in outside 12 in walkway width, or uses an assistive device.   ? Gait with Vertical Head Turns Performs head turns with no change in gait. Deviates no more than 6 in outside 12 in walkway width.   ? Gait and Pivot Turn Pivot turns safely within 3 sec and stops quickly with no loss of balance.   ? Step Over Obstacle Is able to step over one shoe box (4.5 in total height) without changing gait speed. No evidence of  imbalance.   ? Gait with Narrow Base of Support Ambulates less than 4 steps heel to toe or cannot perform without assistance.   uses cane  ? Gait with Eyes Closed Walks 20 ft, uses assistive device, slower speed, mild gait deviations, deviates 6-10 in outside 12 in walkway width. Ambulates 20 ft in less than 9 sec but greater than 7 sec.   ? Ambulating Backwards Walks 20 ft, slow speed, abnormal gait pattern, evidence for imbalance, deviates 10-15 in outside 12 in walkway width.   ? Steps Alternating feet, must use rail.   ? Total Score 20   ? ?  ?  ? ?  ? ? ?  ?  ?  ?  ?PATIENT EDUCATION: ?Education details: Initial HEP, general safety with HEP and additional exercise. ?Person educated: Patient ?Education method: Explanation; Handout ?Education comprehension: verbalized understanding ?  ?  ?HOME EXERCISE PROGRAM: ?[id number] ?  ?ASSESSMENT: ?  ?CLINICAL IMPRESSION: ?Assessed FGA with appropriate LTG set this visit.  Pt scored 20/30 on FGA indicating moderate fall risk.  He tolerated manual therapy well and demonstrates fair left DF activation in supine this visit.  Will progress to more dynamic heel stretching in coming visits.  Initiated HEP with emphasis on left flexibility and strength.  Continue per POC. ?  ?  ?  ?OBJECTIVE IMPAIRMENTS Abnormal gait, decreased balance, decreased coordination, decreased knowledge of use of DME, difficulty walking, decreased ROM, decreased  strength, hypomobility, and impaired flexibility.  ?  ?ACTIVITY LIMITATIONS cleaning, community activity, driving, laundry, yard work, and hobbies: golfing and bowling .  ?  ?PERSONAL FACTORS Age, Past/current exper

## 2021-07-27 NOTE — Therapy (Signed)
[place] [facility] ?[place], [name], [address] ?Phone: [phone]   Fax:  [phone] ? ?Occupational Therapy Treatment ? ?Patient Details  ?Name: Eddie Calhoun ?MRN: [id number] ?Date of Birth: 07/11/1950 ?Referring Provider (OT): Lovvorn ? ? ?Encounter Date: 07/27/2021 ? ? OT End of Session - 07/27/21 1017   ? ? Visit Number 3   ? Number of Visits 17   ? Date for OT Re-Evaluation 10/01/21   ? Authorization Type Medicare and AARP supplement   ? OT Start Time 1016   ? OT Stop Time 1100   ? OT Time Calculation (min) 44 min   ? Activity Tolerance Patient tolerated treatment well   ? Behavior During Therapy [facility] for tasks assessed/performed   ? ?  ?  ? ?  ? ? ?Past Medical History:  ?Diagnosis Date  ? Allergy   ? allergic rhinitis  ? BPH (benign prostatic hyperplasia)   ? Elevated prostate specific antigen (PSA)   ? Hypertension   ? Plantar fasciitis of right foot   ? Pneumonia   ? Thrombocytopenia, unspecified ([place]) 07/26/2012  ? 03/24/12  129,000!  ? ? ?History reviewed. No pertinent surgical history. ? ?There were no vitals filed for this visit. ? ? Subjective Assessment - 07/27/21 1018   ? ? Subjective  Maybe just slept wrong.   ? Pertinent History Rt BG infarct 07/01/21. PMH: HTN, HLD   ? Limitations Fall risk, no driving   ? Currently in Pain? Yes   ? Pain Score 2    ? Pain Location Back   ? Pain Orientation Lower   ? Pain Descriptors / Indicators Aching;Sore   ? Pain Type Acute pain   ? Pain Onset Today   ? Pain Frequency Intermittent   ? ?  ?  ? ?  ? ?ADLs pt reports planning, prepping and cooking a vegetable frittata last night with assistance for cutting mushrooms from spouse. Pt also reports increased participation in IADLs with vacuuming  ? ?HEP made modificatoins to instruction sheet re: set number. (Reduced to 1-2 sets vs 12 sets) ? ?Physioball with forward reaching x 10 reps while seated edge of mat with good body mechanics and no  additional cueing. ? ?UE Ranger in standing with LUE while reaching forward with increased hip extension on LLE for increased range of motion and end range. Pt with SBA for stability. X 10 reps for forward reaching and with slight horizontal abduction ? ?Functional Low Level reaching with color dowel pegs with LUE and trunk rotation at edge of mat with grasp and release and coordination. Pt with min difficulty but overall with good body and movements patterns for functional reaching. Cueing req'd for keeping shoulder blades down and back vs shoulder hiking (min) ? ?Medium Pegs copied pattern 100% with LUE and with min cues for shoulder positioning for lower level reaching for pegs. Removed pegs with in hand manipulation with min difficulty/drops ? ?Grooved Pegs with LUE with min difficulty with coordination and increased time - min cues for shoulder and decreasing abduction for compensation. Removed with in hand manipulation with LUE with min difficulty.  ? ? ? ? ? ? ? ? ? ? ? ? ? ? ? OT Education - 07/27/21 1023   ? ? Education Details updated set count from previous HEP as instructions said 12 sets and reduced to 1-2 sets per day   ? Person(s) Educated Patient   ? Methods Explanation;Handout   ? Comprehension  Verbalized understanding   ? ?  ?  ? ?  ? ? ? OT Short Term Goals - 07/27/21 1045   ? ?  ? OT SHORT TERM GOAL #1  ? Title Patient will complete an HEP designed to improve coordination in LUE   ? Time 4   ? Period Weeks   ? Status On-going   ? Target Date 09/01/21   ?  ? OT SHORT TERM GOAL #2  ? Title Patient will demonstrate ability to heat and serve simple warm meal for he and his wife with intermittent support   ? Time 4   ? Period Weeks   ? Status On-going   reported doing higher cooking last night 07/27/21  ?  ? OT SHORT TERM GOAL #3  ? Title Patient will feed himself with appropriate use of utensils in 30% less time without increased spills/drops   ? Time 4   ? Period Weeks   ? Status On-going   pt  reports doing this consistently 07/27/21  ?  ? OT SHORT TERM GOAL #4  ? Title Patient will report improved effectiveness and efficiency with brushing his teeth using dominant LUE   ? Time 4   ? Period Weeks   ? Status On-going   pt reports doing tihs consistently 07/27/21  ? ?  ?  ? ?  ? ? ? ? OT Long Term Goals - 07/18/21 1520   ? ?  ? OT LONG TERM GOAL #1  ? Title Patient will complete Home Activity Program designed to improve his ability to use LUE as dominant side again.   ? Time 8   ? Period Weeks   ? Status New   ? Target Date 10/01/21   ?  ? OT LONG TERM GOAL #2  ? Title Patient will demonstrate ability to putt a golf ball with BUE with 75% accuracy   ? Time 8   ? Period Weeks   ? Status New   ?  ? OT LONG TERM GOAL #3  ? Title Patient will be able to swing a golf club through 50% range and control in preparation for full swing   ? Time 8   ? Period Weeks   ? Status New   ?  ? OT LONG TERM GOAL #4  ? Title Patient will demonstrate awareness of return to driving recommendations   ? Time 8   ? Period Weeks   ? Status New   ?  ? OT LONG TERM GOAL #5  ? Title Patient will return to gardening - weeding, raking, hoeing with intermittent assistance   ? Time 8   ? Period Weeks   ? Status New   ?  ? Long Term Additional Goals  ? Additional Long Term Goals Yes   ?  ? OT LONG TERM GOAL #6  ? Title Patient will prepare and cook full meal for he and his wife - includes food prep - peeling, chopping, as well as cooking, and clean up.   ? Time 8   ? Period Weeks   ? Status New   ? ?  ?  ? ?  ? ? ? ? ? ? ? ? Plan - 07/27/21 1040   ? ? Clinical Impression Statement Pt continues to progress towards goals. Reports doing more cooking at home and vacuuming.   ? OT Occupational Profile and History Detailed Assessment- Review of Records and additional review of physical, cognitive, psychosocial history related to  current functional performance   ? Occupational performance deficits (Please refer to evaluation for details):  ADL's;IADL's;Leisure   ? Body Structure / Function / Physical Skills ADL;Decreased knowledge of use of DME;Balance;Dexterity;GMC;Tone;Body mechanics;UE functional use;IADL;Coordination;Flexibility;Mobility;FMC   ? Rehab Potential Excellent   ? Clinical Decision Making Limited treatment options, no task modification necessary   ? Comorbidities Affecting Occupational Performance: May have comorbidities impacting occupational performance   ? Modification or Assistance to Complete Evaluation  No modification of tasks or assist necessary to complete eval   ? OT Frequency 2x / week   ? OT Duration 8 weeks   ? OT Treatment/Interventions Self-care/ADL training;Balance training;Aquatic Therapy;Therapeutic activities;Therapeutic exercise;Neuromuscular education;Functional Mobility Training;Patient/family education;DME and/or AE instruction   ? Plan review HEP, continue coordination and proximal strengthening   ? Consulted and Agree with Plan of Care Patient   ? ?  ?  ? ?  ? ? ?Patient will benefit from skilled therapeutic intervention in order to improve the following deficits and impairments:   ?Body Structure / Function / Physical Skills: ADL, Decreased knowledge of use of DME, Balance, Dexterity, GMC, Tone, Body mechanics, UE functional use, IADL, Coordination, Flexibility, Mobility, FMC ?  ?  ? ? ?Visit Diagnosis: ?Other lack of coordination ? ?Hemiplegia and hemiparesis following cerebral infarction affecting left dominant side ([place]) ? ?Other abnormalities of gait and mobility ? ?Muscle weakness (generalized) ? ?Unsteadiness on feet ? ? ? ?Problem List ?Patient Active Problem List  ? Diagnosis Date Noted  ? 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 Acute ischemic stroke ([place]) 07/01/2021  ? Thrombocytopenia, unspecified ([place]) 07/26/2012  ? ? ?Zachery Conch, OT ?07/27/2021, 10:55 AM ? ?[place] ?[address] ?[place], [name], [address] ?Phone: [phone]    Fax:  [phone] ? ?Name: SEIJI WISWELL ?MRN: [id number] ?Date of Birth: 1950/05/22 ? ?

## 2021-07-27 NOTE — Patient Instructions (Addendum)
?  Access Code: [email or web address] ?URL: [email or web address] ?Date: 07/27/2021 ?Prepared by: Elease Etienne ? ?Exercises ?- Gastroc Stretch on Wall  - 1 x daily - 7 x weekly - 2 sets - 2 reps - 30 sec hold -take off AFO ?- Supine Ankle Pumps  - 1 x daily - 7 x weekly - 2 sets - 10 reps -add AAROM to current supine gastroc stretch from rehab to engage ankle through available ROM ?- Standing Tandem Balance with Counter Support  - 1 x daily - 7 x weekly - 3 sets - 10 reps - 30 sec hold -decrease UE support as sway stabilizes, have chair behind for safety ?- Tandem Balance at counter top eyes closed  - 1 x daily - 7 x weekly - 2 sets - 2 reps - 30 sec hold -decrease UE support as sway stabilizes, have chair behind for safety ?- Side Stepping with Counter Support  - 1 x daily - 7 x weekly - 3 sets - 10 reps - intentional stepping, dec UE to inc strength and balance challenge ? ?

## 2021-07-27 NOTE — Patient Instructions (Signed)
Scapular Retraction (Prone) ? ? ?Lie with arms at sides. Pinch shoulder blades together and down towards bottom. ?Repeat _10___ times per set. Do _1___ sets per session. Do _2___ sessions per day. ?  ?  ?Scapular Retraction: Abduction (Prone) ? ? ? ?Lie with upper arms straight out from sides, elbows bent to 90?Marland Kitchen Pinch shoulder blades together and raise arms a few inches from floor. ?Repeat _10___ times per set. Do _1___ sets per session. Do _1-2___ sessions per day. ?  ?On Elbows (Prone) ? ? ? ?Rise up on elbows as high as possible, keeping hips on floor. Hold _5___ seconds. ?Repeat _10___ times per set. Do __1__ sets per session. Do _1-2___ sessions per day. ?  ?Cranial Flexion: Overhead Arm Extension - Supine ([place]) ? ? ? ?Lie with knees bent, arms beyond head, holding _beach ball. Pull ball up to above face. ?Repeat _10___ times per set. Do __1__ sets per session. Do __1-2__ sessions per day ?

## 2021-07-28 ENCOUNTER — Other Ambulatory Visit: Payer: Self-pay

## 2021-07-28 ENCOUNTER — Encounter: Payer: Medicare Other | Attending: Registered Nurse | Admitting: Registered Nurse

## 2021-07-28 ENCOUNTER — Encounter: Payer: Self-pay | Admitting: Registered Nurse

## 2021-07-28 VITALS — BP 123/75 | HR 62 | Temp 98.3°F | Ht 70.0 in | Wt 193.0 lb

## 2021-07-28 DIAGNOSIS — I6381 Other cerebral infarction due to occlusion or stenosis of small artery: Secondary | ICD-10-CM | POA: Diagnosis not present

## 2021-07-28 DIAGNOSIS — I10 Essential (primary) hypertension: Secondary | ICD-10-CM | POA: Diagnosis not present

## 2021-07-28 NOTE — Progress Notes (Signed)
? ?Subjective:  ? ? Patient ID: Eddie Calhoun, male    DOB: Jan 06, 1951, 71 y.o.   MRN: [id number] ? ?HPI: Eddie Calhoun is a 71 y.o. male who is here for HFU appointment for follow up of his  Lacunar Infarction  and Essential Hypertension. He presented to [facility] 07/01/[facility] via EMS  with acute onset of left-sided weakness and facial droop.  ? ?Dr Rory Percy: Piedad Climes 07/01/2021 ?HPI: Eddie Calhoun is a 71 y.o. male has medical history of hypertension, recent bilateral carpal tunnel surgeries, BPH, seasonal allergies, was in usual state of health last known well at 63 PM today when he had a sudden onset of left-sided heaviness involving the face arm and leg.  This progressed to complete inability to raise the left arm or leg.  EMS was called.  They noted some left-sided facial asymmetry along with significant left arm and leg weakness for which a code stroke was activated. ?He was evaluated on the bridge emergently and noted to have significant left hemiparesis. ?Noncontrast head CT was obtained which was negative for acute process. ?Given the recent carpal tunnel surgery, risks and benefits for IV TNKase was discussed in detail and patient agreed to proceed with IV TNKase which was administered at 2227 hrs. ? ?Neurology Consulted ? ?CT Head: WO Contrast ? IMPRESSION: ?1. No acute intracranial process. ? ?MRI Brain: MRA ?IMPRESSION: ?Brain MRI: ?  ?Acute perforator infarct at the right basal ganglia and corona ?radiata. ?  ?Intracranial MRA: ?  ?No emergent finding. No branch occlusion or stenosis to correlate ?with the infarct. ?  ?Neck MRA: ?  ?Negative ? ?[name] was maintained on Aspirin and Plavix for CVA prophylaxis. ? ?[name] was admitted to inpatient rehabilitation on 07/04/2021 and discharged on 07/17/2021. He is attending outpatient therapy at [facility]. He denies any pain. He rates his pain 0. Also reports he has a good appetite.  ? ? ? ?Pain Inventory ?Average Pain 0 ?Pain Right  Now 0 ?My pain is  No pain , left arm & left leg weakness ? ?LOCATION OF PAIN  No pain ? ?BOWEL ?Number of stools per week: 7 ? ? ?BLADDER ?Normal ? ? ? ?Mobility ?walk without assistance ?use a cane ?how many minutes can you walk? 10 minutes ?ability to climb steps?  yes ?do you drive?  no ?Do you have any goals in this area?  yes ? ?Function ?retired ? ?Neuro/Psych ?weakness ?trouble walking ?spasms ? ?Prior Studies ?Any changes since last visit?  no ? ?Physicians involved in your care ?Any changes since last visit?  no ? ? ?Family History  ?Problem Relation Age of Onset  ? Hypertension Mother   ? Hypertension Father   ? Cancer Father 46  ?     father died with hx of prostate cancer  ? ?Social History  ? ?Socioeconomic History  ? Marital status: Married  ?  Spouse name: Not on file  ? Number of children: Not on file  ? Years of education: Not on file  ? Highest education level: Not on file  ?Occupational History  ? Not on file  ?Tobacco Use  ? Smoking status: Former  ?  Packs/day: 2.00  ?  Years: 20.00  ?  Pack years: 40.00  ?  Types: Cigarettes  ?  Quit date: 09/21/1987  ?  Years since quitting: 33.8  ? Smokeless tobacco: Never  ?Vaping Use  ? Vaping Use: Never used  ?Substance and Sexual Activity  ?  Alcohol use: No  ? Drug use: Never  ? Sexual activity: Not Currently  ?Other Topics Concern  ? Not on file  ?Social History Narrative  ? Not on file  ? ?Social Determinants of Health  ? ?Financial Resource Strain: Not on file  ?Food Insecurity: Not on file  ?Transportation Needs: Not on file  ?Physical Activity: Not on file  ?Stress: Not on file  ?Social Connections: Not on file  ? ?History reviewed. No pertinent surgical history. ?Past Medical History:  ?Diagnosis Date  ? Allergy   ? allergic rhinitis  ? BPH (benign prostatic hyperplasia)   ? Elevated prostate specific antigen (PSA)   ? Hypertension   ? Plantar fasciitis of right foot   ? Pneumonia   ? Thrombocytopenia, unspecified ([place]) 07/26/2012  ? 03/24/12   129,000!  ? ?Ht '5\' 10"'$  (1.778 m)   Wt 193 lb (87.5 kg)   BMI 27.69 kg/m?  ? ?Opioid Risk Score:   ?Fall Risk Score:  `1 ? ?Depression screen PHQ 2/9 ? ? ?  07/28/2021  ?  1:14 PM  ?Depression screen PHQ 2/9  ?Decreased Interest 0  ?Down, Depressed, Hopeless 0  ?PHQ - 2 Score 0  ?Altered sleeping 0  ?Tired, decreased energy 1  ?Change in appetite 0  ?Feeling bad or failure about yourself  0  ?Trouble concentrating 0  ?Moving slowly or fidgety/restless 3  ?Suicidal thoughts 0  ?PHQ-9 Score 4  ? ? ?Review of Systems  ?Musculoskeletal:  Positive for gait problem.  ?Neurological:  Positive for weakness.  ?     Weakness & Spasms on left side (leg & arm)  ?All other systems reviewed and are negative. ? ?   ?Objective:  ? Physical Exam ?Vitals and nursing note reviewed.  ?Constitutional:   ?   Appearance: Normal appearance.  ?Cardiovascular:  ?   Rate and Rhythm: Normal rate and regular rhythm.  ?   Pulses: Normal pulses.  ?   Heart sounds: Normal heart sounds.  ?Pulmonary:  ?   Effort: Pulmonary effort is normal.  ?   Breath sounds: Normal breath sounds.  ?Musculoskeletal:  ?   Cervical back: Normal range of motion and neck supple.  ?   Comments: Normal Muscle Bulk and Muscle Testing Reveals:  ?Upper Extremities: Full ROM and Muscle Strength 4/5 ?Lower Extremities : Full ROM and Muscle 5/5 ?Wearing Left AFO ?Arises from Table using cane  ?Narrow Based Gait  ?   ?Skin: ?   General: Skin is warm and dry.  ?Neurological:  ?   Mental Status: He is alert and oriented to person, place, and time.  ?Psychiatric:     ?   Mood and Affect: Mood normal.     ?   Behavior: Behavior normal.  ? ? ? ? ?   ?Assessment & Plan:  ?Lacunar Infarction: Continue Outpatient Therapy at [facility]. He has a scheduled appointment with Neurology. ?Essential Hypertension: Continue current medication regimen. PCP following. Continue to Monitor.  ?F/U with Dr Dagoberto Ligas in 4- 6 weeks  ? ? ? ? ? ?

## 2021-08-01 ENCOUNTER — Ambulatory Visit: Payer: Medicare Other | Admitting: Physical Therapy

## 2021-08-01 ENCOUNTER — Other Ambulatory Visit: Payer: Self-pay

## 2021-08-01 ENCOUNTER — Encounter: Payer: Self-pay | Admitting: Registered Nurse

## 2021-08-01 ENCOUNTER — Encounter: Payer: Self-pay | Admitting: Physical Therapy

## 2021-08-01 ENCOUNTER — Encounter: Payer: Self-pay | Admitting: Occupational Therapy

## 2021-08-01 ENCOUNTER — Ambulatory Visit: Payer: Medicare Other | Admitting: Occupational Therapy

## 2021-08-01 DIAGNOSIS — R2681 Unsteadiness on feet: Secondary | ICD-10-CM

## 2021-08-01 DIAGNOSIS — M6281 Muscle weakness (generalized): Secondary | ICD-10-CM

## 2021-08-01 DIAGNOSIS — I69352 Hemiplegia and hemiparesis following cerebral infarction affecting left dominant side: Secondary | ICD-10-CM | POA: Diagnosis not present

## 2021-08-01 DIAGNOSIS — R278 Other lack of coordination: Secondary | ICD-10-CM

## 2021-08-01 DIAGNOSIS — R2689 Other abnormalities of gait and mobility: Secondary | ICD-10-CM

## 2021-08-01 NOTE — Therapy (Signed)
?OUTPATIENT OCCUPATIONAL THERAPY TREATMENT NOTE ? ? ?Patient Name: Eddie Calhoun ?MRN: [id number] ?DOB:05/06/1951, 71 y.o., male ?Today's Date: 08/01/2021 ? ?PCP: Josetta Huddle, MD ?REFERRING PROVIDER: Courtney Heys, MD ? ? OT End of Session - 08/01/21 1402   ? ? Visit Number 4   ? Number of Visits 17   ? Date for OT Re-Evaluation 10/01/21   ? Authorization Type Medicare and AARP supplement   ? OT Start Time 1402   ? OT Stop Time 9211   ? OT Time Calculation (min) 43 min   ? Activity Tolerance Patient tolerated treatment well   ? Behavior During Therapy [facility] for tasks assessed/performed   ? ?  ?  ? ?  ? ? ?Past Medical History:  ?Diagnosis Date  ? Allergy   ? allergic rhinitis  ? BPH (benign prostatic hyperplasia)   ? Elevated prostate specific antigen (PSA)   ? Hypertension   ? Plantar fasciitis of right foot   ? Pneumonia   ? Thrombocytopenia, unspecified ([place]) 07/26/2012  ? 03/24/12  129,000!  ? ?History reviewed. No pertinent surgical history. ?Patient Active Problem List  ? Diagnosis Date Noted  ? Lacunar infarction ([place]) 07/04/2021  ? Acute ischemic stroke ([place]) 07/01/2021  ? Thrombocytopenia, unspecified ([place]) 07/26/2012  ? ? ?ONSET DATE: 07/01/21 ? ?REFERRING DIAG: corona radiata, right basal ganglia  ? ?THERAPY DIAG:  ?Other abnormalities of gait and mobility ? ?Muscle weakness (generalized) ? ?Unsteadiness on feet ? ?Hemiplegia and hemiparesis following cerebral infarction affecting left dominant side ([place]) ? ?Other lack of coordination ? ? ?PERTINENT HISTORY: HTN ? ?PRECAUTIONS: Fall Risk ? ?SUBJECTIVE: "I did some dancing Saturday night" ? ?PAIN:  ?Are you having pain? No ? ? ? ? ?OBJECTIVE:  ? ?TODAY'S TREATMENT:  ? ?08/01/21 ?Small Peg Board with LUE with one at a time and in hand manipulation for placing pegs into board. Pt removed with in hand manipulation. Pt copied pattern with 100% accuracy. Pt req'd min cues and completed with min difficulty and drops. ? ?In Hand Manipulation  with golf balls with  LUE with mod difficulty and drops. Pt with increased skills rotating outside/counterclockwise vs inside/clockwise. ? ?Resistance Clothespins 1-8# with LUE for mid and high functional reaching and sustained pinch. Pt req'd min cues for movement pattern and maintaining good positioning of LUE with reach. ? ? ? ? OT Short Term Goals - 07/27/21 1045   ? ?  ? OT SHORT TERM GOAL #1  ? Title Patient will complete an HEP designed to improve coordination in LUE   ? Time 4   ? Period Weeks   ? Status On-going   ? Target Date 09/01/21   ?  ? OT SHORT TERM GOAL #2  ? Title Patient will demonstrate ability to heat and serve simple warm meal for he and his wife with intermittent support   ? Time 4   ? Period Weeks   ? Status On-going   reported doing higher cooking last night 07/27/21  ?  ? OT SHORT TERM GOAL #3  ? Title Patient will feed himself with appropriate use of utensils in 30% less time without increased spills/drops   ? Time 4   ? Period Weeks   ? Status On-going   pt reports doing this consistently 07/27/21  ?  ? OT SHORT TERM GOAL #4  ? Title Patient will report improved effectiveness and efficiency with brushing his teeth using dominant LUE   ? Time 4   ? Period Weeks   ?  Status On-going   pt reports doing tihs consistently 07/27/21  ? ?  ?  ? ?  ? ? ? OT Long Term Goals - 07/18/21 1520   ? ?  ? OT LONG TERM GOAL #1  ? Title Patient will complete Home Activity Program designed to improve his ability to use LUE as dominant side again.   ? Time 8   ? Period Weeks   ? Status New   ? Target Date 10/01/21   ?  ? OT LONG TERM GOAL #2  ? Title Patient will demonstrate ability to putt a golf ball with BUE with 75% accuracy   ? Time 8   ? Period Weeks   ? Status New   ?  ? OT LONG TERM GOAL #3  ? Title Patient will be able to swing a golf club through 50% range and control in preparation for full swing   ? Time 8   ? Period Weeks   ? Status New   ?  ? OT LONG TERM GOAL #4  ? Title Patient will demonstrate awareness of return to  driving recommendations   ? Time 8   ? Period Weeks   ? Status New   ?  ? OT LONG TERM GOAL #5  ? Title Patient will return to gardening - weeding, raking, hoeing with intermittent assistance   ? Time 8   ? Period Weeks   ? Status New   ?  ? Long Term Additional Goals  ? Additional Long Term Goals Yes   ?  ? OT LONG TERM GOAL #6  ? Title Patient will prepare and cook full meal for he and his wife - includes food prep - peeling, chopping, as well as cooking, and clean up.   ? Time 8   ? Period Weeks   ? Status New   ? ?  ?  ? ?  ? ? ? Plan - 08/01/21 1412   ? ? Clinical Impression Statement Pt reports doing some dancing this weekend and with no LOB or difficulty. Pt continues to improve towards increased independence and skills with ADLs and IADLs.   ? OT Occupational Profile and History Detailed Assessment- Review of Records and additional review of physical, cognitive, psychosocial history related to current functional performance   ? Occupational performance deficits (Please refer to evaluation for details): ADL's;IADL's;Leisure   ? Body Structure / Function / Physical Skills ADL;Decreased knowledge of use of DME;Balance;Dexterity;GMC;Tone;Body mechanics;UE functional use;IADL;Coordination;Flexibility;Mobility;FMC   ? Rehab Potential Excellent   ? Clinical Decision Making Limited treatment options, no task modification necessary   ? Comorbidities Affecting Occupational Performance: May have comorbidities impacting occupational performance   ? Modification or Assistance to Complete Evaluation  No modification of tasks or assist necessary to complete eval   ? OT Frequency 2x / week   ? OT Duration 8 weeks   ? OT Treatment/Interventions Self-care/ADL training;Balance training;Aquatic Therapy;Therapeutic activities;Therapeutic exercise;Neuromuscular education;Functional Mobility Training;Patient/family education;DME and/or AE instruction   ? Plan review HEP, continue coordination and proximal strengthening   ?  Consulted and Agree with Plan of Care Patient   ? ?  ?  ? ?  ? ? ? ?Zachery Conch, OT ?08/01/2021, 2:13 PM ? ?  ? ? ? ?

## 2021-08-01 NOTE — Therapy (Signed)
?OUTPATIENT PHYSICAL THERAPY TREATMENT NOTE ? ? ?Patient Name: Eddie Calhoun ?MRN: [id number] ?DOB:06/19/1950, 71 y.o., male ?Today's Date: 08/01/2021 ? ?PCP: Josetta Huddle, MD ?REFERRING PROVIDER: Courtney Heys, MD ? ? PT End of Session - 08/01/21 1318   ? ? Visit Number 3   ? Number of Visits 13   12+eval  ? Date for PT Re-Evaluation 09/15/21   ? Authorization Type Medicare & Aarp Supplement  Covered 100%  VL: Follows Medicare guidelines-10th visit PN   ? Progress Note Due on Visit 10   ? PT Start Time 1315   ? PT Stop Time 1400   ? PT Time Calculation (min) 45 min   ? Equipment Utilized During Treatment Gait belt   Pt's SPC w/ quad tip and L AFO  ? Activity Tolerance Patient tolerated treatment well   ? Behavior During Therapy [facility] for tasks assessed/performed   ? ?  ?  ? ?  ? ? ?Past Medical History:  ?Diagnosis Date  ? Allergy   ? allergic rhinitis  ? BPH (benign prostatic hyperplasia)   ? Elevated prostate specific antigen (PSA)   ? Hypertension   ? Plantar fasciitis of right foot   ? Pneumonia   ? Thrombocytopenia, unspecified ([place]) 07/26/2012  ? 03/24/12  129,000!  ? ?History reviewed. No pertinent surgical history. ?Patient Active Problem List  ? Diagnosis Date Noted  ? Lacunar infarction ([place]) 07/04/2021  ? Acute ischemic stroke ([place]) 07/01/2021  ? Thrombocytopenia, unspecified ([place]) 07/26/2012  ? ? ?REFERRING DIAG: I63.9 (ICD-10-CM) - CVA (cerebral vascular accident) ([place])  ? ?THERAPY DIAG:  ?Other abnormalities of gait and mobility ? ?Muscle weakness (generalized) ? ?Unsteadiness on feet ? ?PERTINENT HISTORY: Admitted to ED 07/01/2021 for R CVA w/ Left hemiparesis.  He had carpal tunnel surgery in early February 2023.  Discharged from inpatient rehab 07/17/2021.  ?PMH:  chronic thrombocytopenia, BPH, hyperlipidemia, HN, AKI ? ?PRECAUTIONS: Fall ? ?SUBJECTIVE: Pt states he went to a wedding and was able to dance without using cane.  He is feeling like he is not relying on cane as much. ? ?PAIN:  ?Are you  having pain? No ? ?TODAY'S TREATMENT: ?Pt in sitting w/ L AFO removed using baps board on second height setting to balance foot in center of board>PF x12> Dfx12>coordinated forward/backward taps x12>inv/ev stretch; pt cued for use of entire LE mm to control foot position on board and promote multidirection stability. ?NuStep w/o use of AFO to promote ankle engagement and ROM using only BLE x54mns L5>L6x452ms using BUE/BLE to promote reciprocal movement and trunk engagement to task. ?STS in staggered stance LLE backx6> STS w/ RLE elevated on 4" step x10>STS w/ RLE elevated w/ 5lb kettlebell raise overhead w/ LUE ?Pt stands at countertop performing calf raises w/ therapist provided weight shift progressed to just verbal cuing to promote weight on LLE during eccentric lower 2x12 ?Pt uses wall for posterior chain support and yellow weighted ball using BUE to roll ball at wall using deep squat to pick ball up from floor (to simulate bowling).  Cued to maintain knees apart. CGA-MinA during squat for safety. ?Pt completes 90' + 74' + 60' in gym CGA on level surface w/o AFO donned or AD use.  He uses step through gait w/ fair arm swing bilaterally. He demonstrates soft L knee buckle, but maintains L foot clearance throughout swing.  No overt LOB.  Cued for sequence to engage quad during stance w/ mild improvement in buckle. ? ?OBJECTIVE:  ?BP from  eval at onset of session:  142/92 on RUE in sitting EOM ?  ?DIAGNOSTIC FINDINGS: MRI 07/02/2021 showed Acute perforator infarct at the right basal ganglia and corona radiata. ?  ?COGNITION: ?Overall cognitive status: Within functional limits for tasks assessed ?            ?SENSATION: ?Light touch: WFL ?Proprioception: WFL ?  ?COORDINATION: ?Heel-to-shin:  left w/ inc ataxia with inc speed of movement and dec ROM at hip ?Fingertip opposition:  LUE limited d/t tightness in thenar eminence; pt states from CT surgery ?LE RAMPS:  Unable to accurately assess d/t L DF weakness ?  ?   ?EDEMA:  ?Pt reports he gets mild swelling in bilateral ankles intermittently, he wore compression socks ?  ?MUSCLE TONE: WFL BLE ?  ?  ?MUSCLE LENGTH: ?Hamstrings: Right 70 deg; Left 60 deg ?  ?  ?POSTURE: No Significant postural limitations ?  ?LE ROM:    ?  ?Passive  Right ?07/18/2021 Left ?07/18/2021  ?Hip flexion [facility] WFL  ?Hip extension      ?Hip abduction [facility] WFL  ?Hip adduction      ?Hip internal rotation [facility] WFL  ?Hip external rotation [facility] WFL  ?Knee flexion [facility] WFL  ?Knee extension [facility] WFL;  mild clicking during extension  ?Ankle dorsiflexion WFL -3 deg  ?Ankle plantarflexion [facility] WFL  ?Ankle inversion      ?Ankle eversion      ? (Blank rows = not tested) ?  ?MMT:   ?  ?MMT Right ?07/18/2021 Left ?07/18/2021  ?Hip flexion 4+/5 3+/5  ?Hip extension      ?Hip abduction 4+/5 3+/5  ?Hip adduction 5/5 4-/5  ?Hip internal rotation      ?Hip external rotation      ?Knee flexion 4+/5 3/5  ?Knee extension 5/5 3+/5  ?Ankle dorsiflexion 5/5 2/5  ?Ankle plantarflexion 4+/5 2-/5  ?Ankle inversion      ?Ankle eversion      ?(Blank rows = not tested) ?  ?BED MOBILITY:  ?Sit to supine [place] ?Supine to sit [place] ?  ?TRANSFERS: ?Assistive device utilized: Quad cane small base  ?Sit to stand: Modified independence ?Stand to sit: Modified independence ?Chair to chair: Modified independence ?  ?GAIT: ?Gait pattern: step through pattern, decreased ankle dorsiflexion- Left, Left steppage, Left foot flat, scissoring, ataxic, trendelenburg, and narrow BOS ?Distance walked: 44' ?Assistive device utilized: Quad cane small base and L AFO ?Level of assistance: SBA ?Comments: Pt ambulates with increased speed with worsening ataxia of the LLE as gait distance progressed.  Pt has mild scissoring of the LLE during limb advancement.  He ambulates with cane on R. ?  ?FUNCTIONAL TESTs:  ?5 times sit to stand: 15.15sec ?Timed up and go (TUG): 9.96sec very ataxic w/ inc speed ?2 minute walk test: 28' w/ SPC w/ quad  tip and L AFO ?10 meter walk test: 2.78 ft/sec OR 0.84 m/sec ?Functional gait assessment: assessed 07/27/2021 ? ? ? ?  ?  ?  ?  ?PATIENT EDUCATION: ?Education details: Initial HEP, general safety with HEP and additional exercise. ?Person educated: Patient ?Education method: Explanation; Handout ?Education comprehension: verbalized understanding ?  ?  ?HOME EXERCISE PROGRAM: ?[id number] ?  ?ASSESSMENT: ?  ?CLINICAL IMPRESSION: ?Focus of skilled session on simulating pt hobbies as he has recently been trying to return to hobbies involving high level strength and balance.  Worked on functional LE strengthening and ankle engagement to task to improve motor control of LLE.  Pt  demonstrates good improvement of balance and LLE management throughout session including gait training around gym w/o AFO or AD.  He is progressing towards goals. ?  ?  ?  ?OBJECTIVE IMPAIRMENTS Abnormal gait, decreased balance, decreased coordination, decreased knowledge of use of DME, difficulty walking, decreased ROM, decreased strength, hypomobility, and impaired flexibility.  ?  ?ACTIVITY LIMITATIONS cleaning, community activity, driving, laundry, yard work, and hobbies: golfing and bowling .  ?  ?PERSONAL FACTORS Age, Past/current experiences, and 3+ comorbidities:  chronic thrombocytopenia, BPH, hyperlipidemia, HN, AKI  are also affecting patient's functional outcome.  ?  ?  ?REHAB POTENTIAL: Excellent ?  ?CLINICAL DECISION MAKING: Stable/uncomplicated ?  ?EVALUATION COMPLEXITY: Low ?  ?  ?GOALS: ?Goals reviewed with patient? Yes ?  ?SHORT TERM GOALS: Target date:  08/18/2021 ?  ?Pt will be maintain initial strength and balance HEP with supervision from family. ?Baseline: Not initiated on eval. ?Goal status: INITIAL ?  ?2.  Pt will decrease 5xSTS to <13 seconds in order to demonstrate decreased risk for falls and improved functional bilateral LE strength and power.  ?Baseline: 15.15sec w/o UE from standard chair ?Goal status: INITIAL ?  ?3.  Pt  will demonstrate improved LLE coordination and appropriate speed for safety with gait using LRAD.  ?Baseline: ataxic with increased speed using R SPC w/ quad tip ?Goal status: INITIAL ?  ?4.  Pt will demonst

## 2021-08-03 ENCOUNTER — Ambulatory Visit: Payer: Medicare Other | Admitting: Occupational Therapy

## 2021-08-03 ENCOUNTER — Encounter: Payer: Self-pay | Admitting: Occupational Therapy

## 2021-08-03 ENCOUNTER — Ambulatory Visit: Payer: Medicare Other | Admitting: Physical Therapy

## 2021-08-03 DIAGNOSIS — M6281 Muscle weakness (generalized): Secondary | ICD-10-CM

## 2021-08-03 DIAGNOSIS — R2681 Unsteadiness on feet: Secondary | ICD-10-CM

## 2021-08-03 DIAGNOSIS — R2689 Other abnormalities of gait and mobility: Secondary | ICD-10-CM

## 2021-08-03 DIAGNOSIS — I69352 Hemiplegia and hemiparesis following cerebral infarction affecting left dominant side: Secondary | ICD-10-CM

## 2021-08-03 DIAGNOSIS — R278 Other lack of coordination: Secondary | ICD-10-CM

## 2021-08-03 NOTE — Therapy (Signed)
?OUTPATIENT OCCUPATIONAL THERAPY TREATMENT NOTE ? ? ?Patient Name: Eddie Calhoun ?MRN: [id number] ?DOB:1951-02-21, 71 y.o., male ?Today's Date: 08/03/2021 ? ?PCP: Josetta Huddle, MD ?REFERRING PROVIDER: Courtney Heys, MD ? ? OT End of Session - 08/03/21 5035   ? ? Visit Number 5   ? Number of Visits 17   ? Date for OT Re-Evaluation 10/01/21   ? Authorization Type Medicare and AARP supplement   ? OT Start Time 0845   ? OT Stop Time 0930   ? OT Time Calculation (min) 45 min   ? Activity Tolerance Patient tolerated treatment well   ? Behavior During Therapy [facility] for tasks assessed/performed   ? ?  ?  ? ?  ? ? ?Past Medical History:  ?Diagnosis Date  ? Allergy   ? allergic rhinitis  ? BPH (benign prostatic hyperplasia)   ? Elevated prostate specific antigen (PSA)   ? Hypertension   ? Plantar fasciitis of right foot   ? Pneumonia   ? Thrombocytopenia, unspecified ([place]) 07/26/2012  ? 03/24/12  129,000!  ? ?History reviewed. No pertinent surgical history. ?Patient Active Problem List  ? Diagnosis Date Noted  ? 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 Acute ischemic stroke ([place]) 07/01/2021  ? Thrombocytopenia, unspecified ([place]) 07/26/2012  ? ? ?ONSET DATE: 07/01/21 ? ?REFERRING DIAG: corona radiata, right basal ganglia  ? ?THERAPY DIAG:  ?Other abnormalities of gait and mobility ? ?Muscle weakness (generalized) ? ?Unsteadiness on feet ? ?Hemiplegia and hemiparesis following cerebral infarction affecting left dominant side ([place]) ? ?Other lack of coordination ? ? ?PERTINENT HISTORY: HTN ? ?PRECAUTIONS: Fall Risk ? ?SUBJECTIVE: "Early morning - I had to get up and feed the cats" ? ?PAIN:  ?Are you having pain? Yes: NPRS scale: 2/10 ?Pain location: palm of hand - incision site ?Pain description: intermittent ?Aggravating factors: weight bearing ?Relieving factors: rest ? ? ? ? ?OBJECTIVE:  ? ?TODAY'S TREATMENT:  ? ?08/03/21 ?Dowel Exercises while seated edge of mat. Pt with good shoulder control and working on keeping dowel even and  concentrating on good movement patterns and eliminating compensatory movements. 10 reps of shoulder flexion, chest press, horizontal abduction and abduction. ? ?Driving discussed return to driving and any concerns. At this time, OT does not see any concerns with driving but discussed with patient to speak with MD for clearance for driving.  ? ?Reviewed STGs and checked. Met all STGs. ? ?Arm Bike: for conditioning and reciprocal movements on Level 2.5 for 8 minutes (forward and backward). No pain reported and good conditioning and endurance for task. ? ?Grooved Pegs with LUE for increased coordination. Pt placed pegs with in hand manipulation and removed with in hand manipulation. Pt completed with min difficulty and min drops. Pt demonstrates much improved LUE coordination and with in hand manipulation.  ? ? ? ? ?08/01/21 ?Small Peg Board with LUE with one at a time and in hand manipulation for placing pegs into board. Pt removed with in hand manipulation. Pt copied pattern with 100% accuracy. Pt req'd min cues and completed with min difficulty and drops. ? ?In Hand Manipulation  with golf balls with LUE with mod difficulty and drops. Pt with increased skills rotating outside/counterclockwise vs inside/clockwise. ? ?Resistance Clothespins 1-8# with LUE for mid and high functional reaching and sustained pinch. Pt req'd min cues for movement pattern and maintaining good positioning of LUE with reach. ? ? ?PATIENT EDUCATION: ?Education details: Informed patient of progress towards goals and possible d/c prior to end of scheduled  visits. Continue to monitor progress. ?Person educated: Patient ?Education method: Explanation ?Education comprehension: verbalized understanding and needs further education ? ? ? ? OT Short Term Goals - Met all STGs 08/03/21  ? ?  ? OT SHORT TERM GOAL #1  ? Title Patient will complete an HEP designed to improve coordination in LUE   ? Time 4   ? Period Weeks   ? Status Achieved  ? Target Date  09/01/21   ?  ? OT SHORT TERM GOAL #2  ? Title Patient will demonstrate ability to heat and serve simple warm meal for he and his wife with intermittent support   ? Time 4   ? Period Weeks   ? Status Achieved   reported doing higher cooking last night 07/27/21, continues to report cooking independently 08/03/21  ?  ? OT SHORT TERM GOAL #3  ? Title Patient will feed himself with appropriate use of utensils in 30% less time without increased spills/drops   ? Time 4   ? Period Weeks   ? Status Achieved   pt reports using chopsticks and the right utensils  ?  ? OT SHORT TERM GOAL #4  ? Title Patient will report improved effectiveness and efficiency with brushing his teeth using dominant LUE   ? Time 4   ? Period Weeks   ? Status Achieved   pt reports doing this consistently 07/27/21, 08/03/21  ? ?  ?  ? ?  ? ? ? OT Long Term Goals - check by 10/01/21  ? ?  ? OT LONG TERM GOAL #1  ? Title Patient will complete Home Activity Program designed to improve his ability to use LUE as dominant side again.   ? Time 8   ? Period Weeks   ? Status Ongoing  ? Target Date 10/01/21   ?  ? OT LONG TERM GOAL #2  ? Title Patient will demonstrate ability to putt a golf ball with BUE with 75% accuracy   ? Time 8   ? Period Weeks   ? Status New   ?  ? OT LONG TERM GOAL #3  ? Title Patient will be able to swing a golf club through 50% range and control in preparation for full swing   ? Time 8   ? Period Weeks   ? Status New   ?  ? OT LONG TERM GOAL #4  ? Title Patient will demonstrate awareness of return to driving recommendations   ? Time 8   ? Period Weeks   ? Status Ongoing discussed speaking with physician about getting cleared. 08/03/21  ?  ? OT LONG TERM GOAL #5  ? Title Patient will return to gardening - weeding, raking, hoeing with intermittent assistance   ? Time 8   ? Period Weeks   ? Status Ongoing pt reports going to try mowing part of lawn  ?  ? Long Term Additional Goals  ? Additional Long Term Goals Yes   ?  ? OT LONG TERM GOAL #6  ?  Title Patient will prepare and cook full meal for he and his wife - includes food prep - peeling, chopping, as well as cooking, and clean up.   ? Time 8   ? Period Weeks   ? Status Ongoing reports doing this consistently - continue to assess 08/03/21  ? ?  ?  ? ?  ? ? ? Plan -    ? ? Clinical Impression Statement Pt has met all  STGs at this time and is progressing towards long term goals.   ? OT Occupational Profile and History Detailed Assessment- Review of Records and additional review of physical, cognitive, psychosocial history related to current functional performance   ? Occupational performance deficits (Please refer to evaluation for details): ADL's;IADL's;Leisure   ? Body Structure / Function / Physical Skills ADL;Decreased knowledge of use of DME;Balance;Dexterity;GMC;Tone;Body mechanics;UE functional use;IADL;Coordination;Flexibility;Mobility;FMC   ? Rehab Potential Excellent   ? Clinical Decision Making Limited treatment options, no task modification necessary   ? Comorbidities Affecting Occupational Performance: May have comorbidities impacting occupational performance   ? Modification or Assistance to Complete Evaluation  No modification of tasks or assist necessary to complete eval   ? OT Frequency 2x / week   ? OT Duration 8 weeks   ? OT Treatment/Interventions Self-care/ADL training;Balance training;Aquatic Therapy;Therapeutic activities;Therapeutic exercise;Neuromuscular education;Functional Mobility Training;Patient/family education;DME and/or AE instruction   ? Plan review HEP, proximal strengthening, anticipate d/c prior to end of scheduled visits possibly d/t progress  ? Consulted and Agree with Plan of Care Patient   ? ?  ?  ? ?  ? ? ? ?Zachery Conch, OT ?08/03/2021, 9:28 AM ? ?  ? ? ? ?

## 2021-08-03 NOTE — Therapy (Signed)
?OUTPATIENT PHYSICAL THERAPY TREATMENT NOTE ? ? ?Patient Name: Eddie Calhoun ?MRN: [id number] ?DOB:02-Feb-1951, 71 y.o., male ?Today's Date: 08/03/2021 ? ?PCP: Josetta Huddle, MD ?REFERRING PROVIDER: Josetta Huddle, MD ? ? PT End of Session - 08/03/21 9211   ? ? Visit Number 4   ? Number of Visits 13   12+eval  ? Date for PT Re-Evaluation 09/15/21   ? Authorization Type Medicare & Aarp Supplement  Covered 100%  VL: Follows Medicare guidelines-10th visit PN   ? Progress Note Due on Visit 10   ? PT Start Time [phone]   ? PT Stop Time 1011   ? PT Time Calculation (min) 44 min   ? Equipment Utilized During Treatment Other (comment)   Pt's SPC w/ quad tip and L AFO  ? Activity Tolerance Patient tolerated treatment well   ? Behavior During Therapy [facility] for tasks assessed/performed   ? ?  ?  ? ?  ? ? ? ?Past Medical History:  ?Diagnosis Date  ? Allergy   ? allergic rhinitis  ? BPH (benign prostatic hyperplasia)   ? Elevated prostate specific antigen (PSA)   ? Hypertension   ? Plantar fasciitis of right foot   ? Pneumonia   ? Thrombocytopenia, unspecified ([place]) 07/26/2012  ? 03/24/12  129,000!  ? ?No past surgical history on file. ?Patient Active Problem List  ? Diagnosis Date Noted  ? 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 Acute ischemic stroke ([place]) 07/01/2021  ? Thrombocytopenia, unspecified ([place]) 07/26/2012  ? ? ?REFERRING DIAG: I63.9 (ICD-10-CM) - CVA (cerebral vascular accident) ([place])  ? ?THERAPY DIAG:  ?Other abnormalities of gait and mobility ? ?Muscle weakness (generalized) ? ?Unsteadiness on feet ? ?PERTINENT HISTORY: Admitted to ED 07/01/2021 for R CVA w/ Left hemiparesis.  He had carpal tunnel surgery in early February 2023.  Discharged from inpatient rehab 07/17/2021.  ?PMH:  chronic thrombocytopenia, BPH, hyperlipidemia, HN, AKI ? ?PRECAUTIONS: Fall ? ?SUBJECTIVE: Pt states he went to a wedding and was able to dance without using cane.  He is feeling like he is not relying on cane as much. ? ?PAIN:  ?Are you having  pain? No ? ? ?OBJECTIVE:  ?  ?DIAGNOSTIC FINDINGS: MRI 07/02/2021 showed Acute perforator infarct at the right basal ganglia and corona radiata. ?  ?TODAY'S TREATMENT: ?Ther Ex  ?Updated HEP (see bolded below) for emphasis on L quad eccentric strength, L ankle DF strength and improved ROM. Pt able to demonstrate exercises well with few verbal cues required. Emphasis on safe set up at home and wearing L AFO for squat jumps due to L ankle instability.  ?  ? NMR  ?At rebounder, fwd step w/ 5KG ball throw w/BUEs to imitate bowling. No AFO on during activity. Noted increased instability when stepping fwd w/LLE compared to RLE, requiring CGA-min A throughout. With more practice, pt became more stable on LLE.  ?  ?  ?  ?PATIENT EDUCATION: ?Education details: Updates to HEP, not wearing AFO with HEP except during jumps  ?Person educated: Patient ?Education method: Explanation; Handout ?Education comprehension: verbalized understanding ?  ?  ?HOME EXERCISE PROGRAM: ?Access Code: [email or web address] ?URL: [email or web address] ?Date: 08/03/2021 ?Prepared by: Mickie Bail [employer] ? ?Exercises ?- Gastroc Stretch on Wall  - 1 x daily - 7 x weekly - 2 sets - 2 reps - 30 sec hold ?- Seated Ankle Dorsiflexion with Anchored Resistance  - 1 x daily - 7 x weekly - 3 sets - 10 reps ?- Eccentric Heel Lowering on Step  -  1 x daily - 7 x weekly - 3 sets - 10 reps ?- Forward Step Down  - 1 x daily - 7 x weekly - 3 sets - 10 reps ?- Squat Jumps  - 1 x daily - 7 x weekly - 3 sets - 10 reps ?  ?ASSESSMENT: ?  ?CLINICAL IMPRESSION: ?Emphasis of skilled PT session on updating HEP for L NMR and returning to recreational activities. Pt ambulated out of clinic without use of SPC. Pt continues to demonstrate poor eccentric control of L quad and has tendency to maintain L knee extension during stance phase of gait. Pt unable to control landing from jump without L AFO on, encouraged pt to wear AFO at home during jumping activity. Continue POC.  ?  ?   ?OBJECTIVE IMPAIRMENTS Abnormal gait, decreased balance, decreased coordination, decreased knowledge of use of DME, difficulty walking, decreased ROM, decreased strength, hypomobility, and impaired flexibility.  ?  ?ACTIVITY LIMITATIONS cleaning, community activity, driving, laundry, yard work, and hobbies: golfing and bowling .  ?  ?PERSONAL FACTORS Age, Past/current experiences, and 3+ comorbidities:  chronic thrombocytopenia, BPH, hyperlipidemia, HN, AKI  are also affecting patient's functional outcome.  ?  ?  ?REHAB POTENTIAL: Excellent ?  ?CLINICAL DECISION MAKING: Stable/uncomplicated ?  ?EVALUATION COMPLEXITY: Low ?  ?  ?GOALS: ?Goals reviewed with patient? Yes ?  ?SHORT TERM GOALS: Target date:  08/18/2021 ?  ?Pt will be maintain initial strength and balance HEP with supervision from family. ?Baseline: Not initiated on eval. ?Goal status: INITIAL ?  ?2.  Pt will decrease 5xSTS to <13 seconds in order to demonstrate decreased risk for falls and improved functional bilateral LE strength and power.  ?Baseline: 15.15sec w/o UE from standard chair ?Goal status: INITIAL ?  ?3.  Pt will demonstrate improved LLE coordination and appropriate speed for safety with gait using LRAD.  ?Baseline: ataxic with increased speed using R SPC w/ quad tip ?Goal status: INITIAL ?  ?4.  Pt will demonstrate TUG of <9 seconds in order to decrease risk of falls and improve functional mobility using LRAD. ?Baseline: increased ataxia with LOB due to speed using R SPC w/ quad tip ?Goal status: INITIAL ?  ?5.  FGA will be assessed and goal set on visit 2. ?           Baseline:  Assessed 07/27/2021 ?           Goal status:  Achieved ?  ?LONG TERM GOALS: Target date:  09/15/2021 ?  ?Pt will be independent with finalized strength and balance HEP. ?Baseline: Not initiated on eval ?Goal status: INITIAL ?  ?2.  Pt will ambulate>500 feet on 2MWT to demonstrate improved endurance for functional tasks in home and community.  ?Baseline: 25' w/ SPC  w/ quad tip ?Goal status: INITIAL ?  ?3.  Pt will ambulate at least 600 feet with LRAD at independent level of assist with safe speed and improved LLE placement to promote household and community access. ?Baseline: ataxia 385', scissoring of the LLE using SPC w/ quad tip ?Goal status: INITIAL ?  ?4.  Pt will improve FGA score to 25/30 in order to demonstrate improved balance and decreased fall risk. ?Baseline: 20/30 07/27/2021 ?Goal status: INITIAL ?  ?5.  Pt will improve 10MWT to >/=66msec or 3.3 ft/sec to promote safe access to community. ?Baseline: 2.78 ft/sec OR 0.84 m/sec ?Goal status: INITIAL ?  ?PLAN: ?PT FREQUENCY:  2x/wk for first 4 weeks followed by 1x/wk last 4 weeks ?  ?  PT DURATION: 8 weeks ?  ?PLANNED INTERVENTIONS: Therapeutic exercises, Therapeutic activity, Neuromuscular re-education, Balance training, Gait training, Patient/Family education, Joint mobilization, Stair training, Vestibular training, Orthotic/Fit training, DME instructions, and Manual therapy ?  ?PLAN FOR NEXT SESSION: How is HEP? incorporate bowling activity, weighted rebounders, wall ball, dual task, bilateral arm use to increased LUE proprioception-foam with posterior reach to high placement task, billiards simulation/forward bend with BUE use; SciFit for aerobic health; gait training w/ cane w/ or w/o AFO ? ?Cruzita Lederer Reia Viernes, PT, DPT ?08/03/2021, 10:18 AM ? ?  ? ?

## 2021-08-04 ENCOUNTER — Ambulatory Visit: Payer: Medicare Other | Admitting: Occupational Therapy

## 2021-08-04 ENCOUNTER — Ambulatory Visit: Payer: PRIVATE HEALTH INSURANCE | Admitting: Physical Therapy

## 2021-08-08 ENCOUNTER — Encounter: Payer: Self-pay | Admitting: Physical Therapy

## 2021-08-08 ENCOUNTER — Encounter: Payer: Self-pay | Admitting: Physical Medicine and Rehabilitation

## 2021-08-08 ENCOUNTER — Ambulatory Visit: Payer: Medicare Other | Attending: Physical Medicine and Rehabilitation | Admitting: Physical Therapy

## 2021-08-08 ENCOUNTER — Ambulatory Visit: Payer: Medicare Other | Admitting: Occupational Therapy

## 2021-08-08 ENCOUNTER — Encounter: Payer: Self-pay | Admitting: Occupational Therapy

## 2021-08-08 DIAGNOSIS — M6281 Muscle weakness (generalized): Secondary | ICD-10-CM | POA: Diagnosis present

## 2021-08-08 DIAGNOSIS — R2689 Other abnormalities of gait and mobility: Secondary | ICD-10-CM | POA: Insufficient documentation

## 2021-08-08 DIAGNOSIS — R278 Other lack of coordination: Secondary | ICD-10-CM | POA: Diagnosis present

## 2021-08-08 DIAGNOSIS — I69352 Hemiplegia and hemiparesis following cerebral infarction affecting left dominant side: Secondary | ICD-10-CM | POA: Diagnosis present

## 2021-08-08 DIAGNOSIS — R270 Ataxia, unspecified: Secondary | ICD-10-CM | POA: Diagnosis present

## 2021-08-08 DIAGNOSIS — R2681 Unsteadiness on feet: Secondary | ICD-10-CM

## 2021-08-08 NOTE — Therapy (Signed)
?OUTPATIENT OCCUPATIONAL THERAPY TREATMENT NOTE ? ? ?Patient Name: Eddie Calhoun ?MRN: [id number] ?DOB:[birth year], 71 y.o., male ?Today's Date: 08/08/2021 ? ?PCP: Josetta Huddle, MD ?REFERRING PROVIDER: Courtney Heys, MD ? ? OT End of Session - 08/08/21 1337   ? ? Visit Number 6   ? Number of Visits 17   ? Date for OT Re-Evaluation 10/01/21   ? Authorization Type Medicare and AARP supplement   ? OT Start Time 1100   ? OT Stop Time 1145   ? OT Time Calculation (min) 45 min   ? Activity Tolerance Patient tolerated treatment well   ? Behavior During Therapy [facility] for tasks assessed/performed   ? ?  ?  ? ?  ? ? ?Past Medical History:  ?Diagnosis Date  ? Allergy   ? allergic rhinitis  ? BPH (benign prostatic hyperplasia)   ? Elevated prostate specific antigen (PSA)   ? Hypertension   ? Plantar fasciitis of right foot   ? Pneumonia   ? Thrombocytopenia, unspecified ([place]) 07/26/2012  ? 03/24/12  129,000!  ? ?History reviewed. No pertinent surgical history. ?Patient Active Problem List  ? Diagnosis Date Noted  ? 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 Acute ischemic stroke ([place]) 07/01/2021  ? Thrombocytopenia, unspecified ([place]) 07/26/2012  ? ? ?ONSET DATE: 07/01/21 ? ?REFERRING DIAG: corona radiata, right basal ganglia  ? ?THERAPY DIAG:  ?Hemiplegia and hemiparesis following cerebral infarction affecting left dominant side ([place]) ? ?Ataxia ? ?Unsteadiness on feet ? ?Muscle weakness (generalized) ? ?Other lack of coordination ? ? ?PERTINENT HISTORY: HTN ? ?PRECAUTIONS: Fall Risk ? ?SUBJECTIVE: I carried a tray of beers to my wife and her friends at the bowling alley! ?PAIN:  ?Are you having pain? No  ? ? ? ? ?OBJECTIVE:  ? ?TODAY'S TREATMENT:  ? ?08/08/21: ?Neuromuscular reeducation.  Patient walked into clinic from lobby carrying cane.  Patient wearing left AFO.  Patient with increased trunk movement and lateral hip movement during walking, and left arm with observed stiffness during walking.  Worked on mid to high reach first in  seated position - slight increase in tension noted in neck with higher shoulder ranges - although with cueing able to correct.  Worked on fine motor control while reaching overhead.  Patient with decreased hand control, distal UE control during high reach.  Patient still wakes with stiffness in MCP's/ forearm.  Patient reports as day goes on this improves.   ?Worked on high level balance standing on left leg, and using RLE to hit targets, or step in different directions.  Patient initially had difficulty sufficiently shifting weight to left to free RLE for stepping, then transitioned to standing and pre-juggling, then walking - with emphasis on upright postural control, and proximal muscular activation.   ? ?08/03/21 ?Dowel Exercises while seated edge of mat. Pt with good shoulder control and working on keeping dowel even and concentrating on good movement patterns and eliminating compensatory movements. 10 reps of shoulder flexion, chest press, horizontal abduction and abduction. ? ?Driving discussed return to driving and any concerns. At this time, OT does not see any concerns with driving but discussed with patient to speak with MD for clearance for driving.  ? ?Reviewed STGs and checked. Met all STGs. ? ?Arm Bike: for conditioning and reciprocal movements on Level 2.5 for 8 minutes (forward and backward). No pain reported and good conditioning and endurance for task. ? ?Grooved Pegs with LUE for increased coordination. Pt placed pegs with in hand manipulation and removed with  in hand manipulation. Pt completed with min difficulty and min drops. Pt demonstrates much improved LUE coordination and with in hand manipulation.  ? ? ? ? ? ? ?PATIENT EDUCATION: ?Education details: Informed patient of progress towards goals and possible d/c prior to end of scheduled visits. Continue to monitor progress. ?Person educated: Patient ?Education method: Explanation ?Education comprehension: verbalized understanding and needs  further education ? ? ? ? OT Short Term Goals - Met all STGs 08/03/21  ? ?  ? OT SHORT TERM GOAL #1  ? Title Patient will complete an HEP designed to improve coordination in LUE   ? Time 4   ? Period Weeks   ? Status Achieved  ? Target Date 09/01/21   ?  ? OT SHORT TERM GOAL #2  ? Title Patient will demonstrate ability to heat and serve simple warm meal for he and his wife with intermittent support   ? Time 4   ? Period Weeks   ? Status Achieved   reported doing higher cooking last night 07/27/21, continues to report cooking independently 08/03/21  ?  ? OT SHORT TERM GOAL #3  ? Title Patient will feed himself with appropriate use of utensils in 30% less time without increased spills/drops   ? Time 4   ? Period Weeks   ? Status Achieved   pt reports using chopsticks and the right utensils  ?  ? OT SHORT TERM GOAL #4  ? Title Patient will report improved effectiveness and efficiency with brushing his teeth using dominant LUE   ? Time 4   ? Period Weeks   ? Status Achieved   pt reports doing this consistently 07/27/21, 08/03/21  ? ?  ?  ? ?  ? ? ? OT Long Term Goals - check by 10/01/21  ? ?  ? OT LONG TERM GOAL #1  ? Title Patient will complete Home Activity Program designed to improve his ability to use LUE as dominant side again.   ? Time 8   ? Period Weeks   ? Status Ongoing  ? Target Date 10/01/21   ?  ? OT LONG TERM GOAL #2  ? Title Patient will demonstrate ability to putt a golf ball with BUE with 75% accuracy   ? Time 8   ? Period Weeks   ? Status New   ?  ? OT LONG TERM GOAL #3  ? Title Patient will be able to swing a golf club through 50% range and control in preparation for full swing   ? Time 8   ? Period Weeks   ? Status New   ?  ? OT LONG TERM GOAL #4  ? Title Patient will demonstrate awareness of return to driving recommendations   ? Time 8   ? Period Weeks   ? Status Ongoing discussed speaking with physician about getting cleared. 08/03/21  ?  ? OT LONG TERM GOAL #5  ? Title Patient will return to gardening -  weeding, raking, hoeing with intermittent assistance   ? Time 8   ? Period Weeks   ? Status Ongoing pt reports going to try mowing part of lawn  ?  ? Long Term Additional Goals  ? Additional Long Term Goals Yes   ?  ? OT LONG TERM GOAL #6  ? Title Patient will prepare and cook full meal for he and his wife - includes food prep - peeling, chopping, as well as cooking, and clean up.   ?  Time 8   ? Period Weeks   ? Status Ongoing reports doing this consistently - continue to assess 08/03/21  ? ?  ?  ? ?  ? ? ? Plan -    ? ? Clinical Impression Statement Pt has met all STGs at this time and is progressing towards long term goals.   ? OT Occupational Profile and History Detailed Assessment- Review of Records and additional review of physical, cognitive, psychosocial history related to current functional performance   ? Occupational performance deficits (Please refer to evaluation for details): ADL's;IADL's;Leisure   ? Body Structure / Function / Physical Skills ADL;Decreased knowledge of use of DME;Balance;Dexterity;GMC;Tone;Body mechanics;UE functional use;IADL;Coordination;Flexibility;Mobility;FMC   ? Rehab Potential Excellent   ? Clinical Decision Making Limited treatment options, no task modification necessary   ? Comorbidities Affecting Occupational Performance: May have comorbidities impacting occupational performance   ? Modification or Assistance to Complete Evaluation  No modification of tasks or assist necessary to complete eval   ? OT Frequency 2x / week   ? OT Duration 8 weeks   ? OT Treatment/Interventions Self-care/ADL training;Balance training;Aquatic Therapy;Therapeutic activities;Therapeutic exercise;Neuromuscular education;Functional Mobility Training;Patient/family education;DME and/or AE instruction   ? Plan Try approach and partial golf swing, try managing knife in LUE, review HEP, proximal strengthening, anticipate d/c prior to end of scheduled visits possibly d/t progress  ? Consulted and Agree with  Plan of Care Patient   ? ?  ?  ? ?  ? ? ? ?Mariah Milling, OT ?08/08/2021, 1:39 PM ? ?  ? ? ? ?

## 2021-08-08 NOTE — Therapy (Signed)
?OUTPATIENT PHYSICAL THERAPY TREATMENT NOTE ? ? ?Patient Name: Eddie Calhoun ?MRN: [id number] ?DOB:1950-10-16, 71 y.o., male ?Today's Date: 08/08/2021 ? ?PCP: Josetta Huddle, MD ?REFERRING PROVIDER: Courtney Heys, MD ? ? PT End of Session - 08/08/21 1152   ? ? Visit Number 5   ? Number of Visits 13   12+eval  ? Date for PT Re-Evaluation 09/15/21   ? Authorization Type Medicare & Aarp Supplement  Covered 100%  VL: Follows Medicare guidelines-10th visit PN   ? Progress Note Due on Visit 10   ? PT Start Time 1150   ? PT Stop Time 1232   ? PT Time Calculation (min) 42 min   ? Equipment Utilized During Treatment Other (comment)   Pt's SPC w/ quad tip and L AFO  ? Activity Tolerance Patient tolerated treatment well   ? Behavior During Therapy [facility] for tasks assessed/performed   ? ?  ?  ? ?  ? ? ? ?Past Medical History:  ?Diagnosis Date  ? Allergy   ? allergic rhinitis  ? BPH (benign prostatic hyperplasia)   ? Elevated prostate specific antigen (PSA)   ? Hypertension   ? Plantar fasciitis of right foot   ? Pneumonia   ? Thrombocytopenia, unspecified ([place]) 07/26/2012  ? 03/24/12  129,000!  ? ?History reviewed. No pertinent surgical history. ?Patient Active Problem List  ? Diagnosis Date Noted  ? 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 Acute ischemic stroke ([place]) 07/01/2021  ? Thrombocytopenia, unspecified ([place]) 07/26/2012  ? ? ?REFERRING DIAG: I63.9 (ICD-10-CM) - CVA (cerebral vascular accident) ([place])  ? ?THERAPY DIAG:  ?Other abnormalities of gait and mobility ? ?Muscle weakness (generalized) ? ?Unsteadiness on feet ? ?Hemiplegia and hemiparesis following cerebral infarction affecting left dominant side ([place]) ? ?Other lack of coordination ? ?PERTINENT HISTORY: Admitted to ED 07/01/2021 for R CVA w/ Left hemiparesis.  He had carpal tunnel surgery in early February 2023.  Discharged from inpatient rehab 07/17/2021.  ?PMH:  chronic thrombocytopenia, BPH, hyperlipidemia, HN, AKI ? ?PRECAUTIONS: Fall ? ?SUBJECTIVE: He has really  enjoyed the heel raises from HEP because they give him a good workout.  He has used the cycle at the gym and feels he is controlling his Left leg better and keeping it from [place] in and out laterally.  Jump squats are helpful. ? ?PAIN:  ?Are you having pain? No ? ? ?OBJECTIVE:  ?  ?DIAGNOSTIC FINDINGS: MRI 07/02/2021 showed Acute perforator infarct at the right basal ganglia and corona radiata. ?  ?TODAY'S TREATMENT: ?Gait training:   ?no AFO, no AD, light CGA-SBA 2 laps, 230', cuing for soft steps, heel first, quad control during stance to promote soft bend instead of hyperextension; 2 laps 230' w/ 2# ankle weight for proprioceptive input w/ pt demo inc L lateral trunk lean during L stance around last 20'  ? ?TherEx: ?Lateral pull up L side only using LUE, cued for core activation w/ 10# and 15# kettlebell 2x12 ?15# Kettlebell swing 2x10 ?LUE only deadlift using 15# kettlebell - added to HEP ?Wall balls w/ modified shallow squat instead of deep squat w/ 4kg ball; cued for sequence into strong toss using tight core, used 2# weight on LUE for inc proprioceptive input during dynamic movement ? ?NMR: ?Standing at countertop w/ 2# weight on LUE using LUE w/o visualization to retrieve cones from posterolateral direction and place on high shelf across midling using eye tracking>repeated task w/ pt standing w/ feet together. ? ? ?  ?  ?  ?  PATIENT EDUCATION: ?Education details: Edu on performing kettlebell deadlifts in gym with seat or bench behind for safety.  Edu on continued use of AFO for dynamic activities like walking and jump squats due to medial and lateral ankle instability. ?Person educated: Patient ?Education method: Explanation; Handout ?Education comprehension: verbalized understanding ?  ?  ?HOME EXERCISE PROGRAM: ?Access Code: [email or web address] ?URL: [email or web address] ?Date: 08/03/2021 ?Prepared by: Mickie Bail Plaster ? ?Exercises ?- Gastroc Stretch on Wall  - 1 x daily - 7 x weekly - 2 sets - 2 reps  - 30 sec hold ?- Seated Ankle Dorsiflexion with Anchored Resistance  - 1 x daily - 7 x weekly - 3 sets - 10 reps ?- Eccentric Heel Lowering on Step  - 1 x daily - 7 x weekly - 3 sets - 10 reps ?- Forward Step Down  - 1 x daily - 7 x weekly - 3 sets - 10 reps ?- Squat Jumps  - 1 x daily - 7 x weekly - 3 sets - 10 reps ?- Kettlebell Deadlift  - 1 x daily - 7 x weekly - 3 sets - 10 reps-using just LUE ?  ?ASSESSMENT: ?  ?CLINICAL IMPRESSION: ?Pt is continuing to progress towards goals demonstrating improved bilateral motor control and dynamic balance throughout session.  Pt remains challenged by L knee control especially with inc speed of gait as well as medial and lateral stability of the left foot.  Activities performed in session w/o AFO to promote ROM, weight-bearing on solid surface, and motor recruitment of distal muscles in LLE with focus on strength and power.  Continue per POC. ?  ?  ?OBJECTIVE IMPAIRMENTS Abnormal gait, decreased balance, decreased coordination, decreased knowledge of use of DME, difficulty walking, decreased ROM, decreased strength, hypomobility, and impaired flexibility.  ?  ?ACTIVITY LIMITATIONS cleaning, community activity, driving, laundry, yard work, and hobbies: golfing and bowling .  ?  ?PERSONAL FACTORS Age, Past/current experiences, and 3+ comorbidities:  chronic thrombocytopenia, BPH, hyperlipidemia, HN, AKI  are also affecting patient's functional outcome.  ?  ?  ?REHAB POTENTIAL: Excellent ?  ?CLINICAL DECISION MAKING: Stable/uncomplicated ?  ?EVALUATION COMPLEXITY: Low ?  ?  ?GOALS: ?Goals reviewed with patient? Yes ?  ?SHORT TERM GOALS: Target date:  08/18/2021 ?  ?Pt will be maintain initial strength and balance HEP with supervision from family. ?Baseline: Not initiated on eval. ?Goal status: INITIAL ?  ?2.  Pt will decrease 5xSTS to <13 seconds in order to demonstrate decreased risk for falls and improved functional bilateral LE strength and power.  ?Baseline: 15.15sec w/o UE  from standard chair ?Goal status: INITIAL ?  ?3.  Pt will demonstrate improved LLE coordination and appropriate speed for safety with gait using LRAD.  ?Baseline: ataxic with increased speed using R SPC w/ quad tip ?Goal status: INITIAL ?  ?4.  Pt will demonstrate TUG of <9 seconds in order to decrease risk of falls and improve functional mobility using LRAD. ?Baseline: increased ataxia with LOB due to speed using R SPC w/ quad tip ?Goal status: INITIAL ?  ?5.  FGA will be assessed and goal set on visit 2. ?           Baseline:  Assessed 07/27/2021 ?           Goal status:  Achieved ?  ?LONG TERM GOALS: Target date:  09/15/2021 ?  ?Pt will be independent with finalized strength and balance HEP. ?Baseline: Not initiated on eval ?Goal status: INITIAL ?  ?2.  Pt will ambulate>500 feet on 2MWT to demonstrate improved endurance for functional tasks in home and community.  ?Baseline: 79' w/ SPC w/ quad tip ?Goal status: INITIAL ?  ?3.  Pt will ambulate at least 600 feet with LRAD at independent level of assist with safe speed and improved LLE placement to promote household and community access. ?Baseline: ataxia 385', scissoring of the LLE using SPC w/ quad tip ?Goal status: INITIAL ?  ?4.  Pt will improve FGA score to 25/30 in order to demonstrate improved balance and decreased fall risk. ?Baseline: 20/30 07/27/2021 ?Goal status: INITIAL ?  ?5.  Pt will improve 10MWT to >/=33msec or 3.3 ft/sec to promote safe access to community. ?Baseline: 2.78 ft/sec OR 0.84 m/sec ?Goal status: INITIAL ?  ?PLAN: ?PT FREQUENCY:  2x/wk for first 4 weeks followed by 1x/wk last 4 weeks ?  ?PT DURATION: 8 weeks ?  ?PLANNED INTERVENTIONS: Therapeutic exercises, Therapeutic activity, Neuromuscular re-education, Balance training, Gait training, Patient/Family education, Joint mobilization, Stair training, Vestibular training, Orthotic/Fit training, DME instructions, and Manual therapy ?  ?PLAN FOR NEXT SESSION: How is gym?  Incorporate bowling  activity-bowling ball weighs 15#, weighted rebounders, wall ball-progress to deeper squat 4kg ball, dual task, bilateral arm use to increased LUE proprioception, billiards simulation/forward bend with BUE

## 2021-08-11 ENCOUNTER — Encounter: Payer: Self-pay | Admitting: Occupational Therapy

## 2021-08-11 ENCOUNTER — Ambulatory Visit: Payer: Medicare Other | Admitting: Occupational Therapy

## 2021-08-11 ENCOUNTER — Ambulatory Visit: Payer: Medicare Other | Admitting: Physical Therapy

## 2021-08-11 DIAGNOSIS — R2689 Other abnormalities of gait and mobility: Secondary | ICD-10-CM

## 2021-08-11 DIAGNOSIS — R2681 Unsteadiness on feet: Secondary | ICD-10-CM

## 2021-08-11 DIAGNOSIS — M6281 Muscle weakness (generalized): Secondary | ICD-10-CM

## 2021-08-11 DIAGNOSIS — I69352 Hemiplegia and hemiparesis following cerebral infarction affecting left dominant side: Secondary | ICD-10-CM

## 2021-08-11 DIAGNOSIS — R278 Other lack of coordination: Secondary | ICD-10-CM

## 2021-08-11 NOTE — Therapy (Signed)
?OUTPATIENT OCCUPATIONAL THERAPY TREATMENT NOTE ? ? ?Patient Name: Eddie Calhoun ?MRN: [id number] ?DOB:[birth year], 71 y.o., male ?Today's Date: 08/11/2021 ? ?PCP: Josetta Huddle, MD ?REFERRING PROVIDER: Josetta Huddle, MD ? ? OT End of Session - 08/11/21 1100   ? ? Visit Number 7   ? Number of Visits 17   ? Date for OT Re-Evaluation 10/01/21   ? Authorization Type Medicare and AARP supplement   ? OT Start Time 1058   ? OT Stop Time 1137   ? OT Time Calculation (min) 39 min   ? Activity Tolerance Patient tolerated treatment well   ? Behavior During Therapy [facility] for tasks assessed/performed   ? ?  ?  ? ?  ? ? ?Past Medical History:  ?Diagnosis Date  ? Allergy   ? allergic rhinitis  ? BPH (benign prostatic hyperplasia)   ? Elevated prostate specific antigen (PSA)   ? Hypertension   ? Plantar fasciitis of right foot   ? Pneumonia   ? Thrombocytopenia, unspecified ([place]) 07/26/2012  ? 03/24/12  129,000!  ? ?History reviewed. No pertinent surgical history. ?Patient Active Problem List  ? Diagnosis Date Noted  ? 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 Acute ischemic stroke ([place]) 07/01/2021  ? Thrombocytopenia, unspecified ([place]) 07/26/2012  ? ? ?ONSET DATE: 07/01/21 ? ?REFERRING DIAG: corona radiata, right basal ganglia  ? ?THERAPY DIAG:  ?Hemiplegia and hemiparesis following cerebral infarction affecting left dominant side ([place]) ? ?Unsteadiness on feet ? ?Muscle weakness (generalized) ? ?Other lack of coordination ? ?Other abnormalities of gait and mobility ? ? ?PERTINENT HISTORY: HTN ? ?PRECAUTIONS: Fall Risk ? ?SUBJECTIVE: I went to the gym, I was playing pool, tried bowling ? ?PAIN:  ?Are you having pain? No  ? ? ? ? ?OBJECTIVE:  ? ?TODAY'S TREATMENT:  ? ? 08/11/21: ?Return to PLOF pt reports getting back to prior activities: bowling, golf, driving, going to the gym, etc. Pt reports doing some weights and exercises at the gym. OT suggested refraining from increased weights and resistance (bowling with LUE, weighted machines with  LUE at gym, etc) with the LUE at this time d/t continued limited strength. Pt reports he does not have difficulty with cutting or dicing vegetables and does so with his LUE.  ? ?Mid/High Level Reaching with Resistance Clothespins 1-8# with LUE for mid and high functional reaching and sustained pinch. Pt req'd no additional cues for movement pattern and maintaining good positioning of LUE with reach. Pt worked on stepping to left for increased weight shifting bearing into LLE.  ? ?Dowel Stretches and exercises shoulder extension x 12, towel stretches with dowel for BUE. ? ?Arm Bike: for conditioning and reciprocal movements on Level 3.5 for 8 minutes (forward and backward) ? ? ?08/08/21: ?Neuromuscular reeducation.  Patient walked into clinic from lobby carrying cane.  Patient wearing left AFO.  Patient with increased trunk movement and lateral hip movement during walking, and left arm with observed stiffness during walking.  Worked on mid to high reach first in seated position - slight increase in tension noted in neck with higher shoulder ranges - although with cueing able to correct.  Worked on fine motor control while reaching overhead.  Patient with decreased hand control, distal UE control during high reach.  Patient still wakes with stiffness in MCP's/ forearm.  Patient reports as day goes on this improves.   ?Worked on high level balance standing on left leg, and using RLE to hit targets, or step in different directions.  Patient  initially had difficulty sufficiently shifting weight to left to free RLE for stepping, then transitioned to standing and pre-juggling, then walking - with emphasis on upright postural control, and proximal muscular activation.   ? ? ? ? ? ? ?PATIENT EDUCATION: ?Education details: Education on limited resistance/weight with LUE. ?Person educated: Patient ?Education method: Explanation ?Education comprehension: verbalized understanding and needs further education ? ? ? ? OT Short Term  Goals - Met all STGs 08/03/21  ? ?  ? OT SHORT TERM GOAL #1  ? Title Patient will complete an HEP designed to improve coordination in LUE   ? Time 4   ? Period Weeks   ? Status Achieved  ? Target Date 09/01/21   ?  ? OT SHORT TERM GOAL #2  ? Title Patient will demonstrate ability to heat and serve simple warm meal for he and his wife with intermittent support   ? Time 4   ? Period Weeks   ? Status Achieved   reported doing higher cooking last night 07/27/21, continues to report cooking independently 08/03/21  ?  ? OT SHORT TERM GOAL #3  ? Title Patient will feed himself with appropriate use of utensils in 30% less time without increased spills/drops   ? Time 4   ? Period Weeks   ? Status Achieved   pt reports using chopsticks and the right utensils  ?  ? OT SHORT TERM GOAL #4  ? Title Patient will report improved effectiveness and efficiency with brushing his teeth using dominant LUE   ? Time 4   ? Period Weeks   ? Status Achieved   pt reports doing this consistently 07/27/21, 08/03/21  ? ?  ?  ? ?  ? ? ? OT Long Term Goals - check by 10/01/21  ? ?  ? OT LONG TERM GOAL #1  ? Title Patient will complete Home Activity Program designed to improve his ability to use LUE as dominant side again.   ? Time 8   ? Period Weeks   ? Status Ongoing  ? Target Date 10/01/21   ?  ? OT LONG TERM GOAL #2  ? Title Patient will demonstrate ability to putt a golf ball with BUE with 75% accuracy   ? Time 8   ? Period Weeks   ? Status Achieved  ?  ? OT LONG TERM GOAL #3  ? Title Patient will be able to swing a golf club through 50% range and control in preparation for full swing   ? Time 8   ? Period Weeks   ? Status Achieved pt showed video of him swinging golf club at home with 15 ft with good swing accuracy  ?  ? OT LONG TERM GOAL #4  ? Title Patient will demonstrate awareness of return to driving recommendations   ? Time 8   ? Period Weeks   ? Status Achieved pt cleared by MD and is now driving  ?  ? OT LONG TERM GOAL #5  ? Title Patient  will return to gardening - weeding, raking, hoeing with intermittent assistance   ? Time 8   ? Period Weeks   ? Status Ongoing pt reports mowing part of lawn with no difficulty  ?  ? Long Term Additional Goals  ? Additional Long Term Goals Yes   ?  ? OT LONG TERM GOAL #6  ? Title Patient will prepare and cook full meal for he and his wife - includes food prep -  peeling, chopping, as well as cooking, and clean up.   ? Time 8   ? Period Weeks   ? Status Achieved reports higher level cooking and prepping with no difficulty at home 08/11/21  ? ?  ?  ? ?  ? ? ? Plan -    ? ? Clinical Impression Statement Pt has met all STGs at this time and is progressing towards long term goals.   ? OT Occupational Profile and History Detailed Assessment- Review of Records and additional review of physical, cognitive, psychosocial history related to current functional performance   ? Occupational performance deficits (Please refer to evaluation for details): ADL's;IADL's;Leisure   ? Body Structure / Function / Physical Skills ADL;Decreased knowledge of use of DME;Balance;Dexterity;GMC;Tone;Body mechanics;UE functional use;IADL;Coordination;Flexibility;Mobility;FMC   ? Rehab Potential Excellent   ? Clinical Decision Making Limited treatment options, no task modification necessary   ? Comorbidities Affecting Occupational Performance: May have comorbidities impacting occupational performance   ? Modification or Assistance to Complete Evaluation  No modification of tasks or assist necessary to complete eval   ? OT Frequency 2x / week   ? OT Duration 8 weeks   ? OT Treatment/Interventions Self-care/ADL training;Balance training;Aquatic Therapy;Therapeutic activities;Therapeutic exercise;Neuromuscular education;Functional Mobility Training;Patient/family education;DME and/or AE instruction   ? Plan review HEP, proximal strengthening, anticipate d/c prior to end of scheduled visits possibly d/t progress  ? Consulted and Agree with Plan of Care  Patient   ? ?  ?  ? ?  ? ? ? ?Zachery Conch, OT ?08/11/2021, 12:00 PM ? ?  ? ? ? ?

## 2021-08-11 NOTE — Therapy (Signed)
?OUTPATIENT PHYSICAL THERAPY TREATMENT NOTE ? ? ?Patient Name: Eddie Calhoun ?MRN: [id number] ?DOB:1950/11/05, 71 y.o., male ?Today's Date: 08/11/2021 ? ?PCP: Josetta Huddle, MD ?REFERRING PROVIDER: Josetta Huddle, MD ? ? PT End of Session - 08/11/21 1007   ? ? Visit Number 6   ? Number of Visits 13   12+eval  ? Date for PT Re-Evaluation 09/15/21   ? Authorization Type Medicare & Aarp Supplement  Covered 100%  VL: Follows Medicare guidelines-10th visit PN   ? Progress Note Due on Visit 10   ? PT Start Time 1005   ? PT Stop Time 1050   ? PT Time Calculation (min) 45 min   ? Equipment Utilized During Treatment Other (comment)   Pt's SPC w/ quad tip and L AFO  ? Activity Tolerance Patient tolerated treatment well   ? Behavior During Therapy [facility] for tasks assessed/performed   ? ?  ?  ? ?  ? ? ? ? ?Past Medical History:  ?Diagnosis Date  ? Allergy   ? allergic rhinitis  ? BPH (benign prostatic hyperplasia)   ? Elevated prostate specific antigen (PSA)   ? Hypertension   ? Plantar fasciitis of right foot   ? Pneumonia   ? Thrombocytopenia, unspecified ([place]) 07/26/2012  ? 03/24/12  129,000!  ? ?No past surgical history on file. ?Patient Active Problem List  ? Diagnosis Date Noted  ? 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 Acute ischemic stroke ([place]) 07/01/2021  ? Thrombocytopenia, unspecified ([place]) 07/26/2012  ? ? ?REFERRING DIAG: I63.9 (ICD-10-CM) - CVA (cerebral vascular accident) ([place])  ? ?THERAPY DIAG:  ?Unsteadiness on feet ? ?Muscle weakness (generalized) ? ?Other abnormalities of gait and mobility ? ?PERTINENT HISTORY: Admitted to ED 07/01/2021 for R CVA w/ Left hemiparesis.  He had carpal tunnel surgery in early February 2023.  Discharged from inpatient rehab 07/17/2021.  ?PMH:  chronic thrombocytopenia, BPH, hyperlipidemia, HN, AKI ? ?PRECAUTIONS: Fall ? ?SUBJECTIVE: Has gone bowling and golfing, which is going well. Reports he needs to work on the forward step w/bowling as he feels off balance.  ? ?PAIN:  ?Are you  having pain? No ? ? ?OBJECTIVE:  ?  ?DIAGNOSTIC FINDINGS: MRI 07/02/2021 showed Acute perforator infarct at the right basal ganglia and corona radiata. ?  ?TODAY'S TREATMENT: ?Gait training w/L AFO donned:   ?The following treadmill training was completed for aerobic/neural priming, endurance, symmetric step length and gait speed.  ?- Warmup: 2:00 up to 1.7 mph ?- Walking on incline: 8 minutes of walking w/elevation increased from 0-8% every 1 minute. Noted increased circumduction of LLE and hip hiking w/increase in elevation, min cues to improve hip/knee flexion for improved step clearance.  ?-Retro walking for 3 minutes at 0% incline and 1.1 mph. Progressed to 4 minutes at 1% incline and 1.1 mph.  Noted heavy footedness on RLE and decreased step length of LLE.  ? ?NMR: ?Alt. Fwd lunge w/slam ball using 2kg ball for improved UE/LE coordination, lateral weight shift, single leg stability and dynamic balance. Pt performed x10 reps per side w/min guard, min cues to maintain weight on front foot and to slow down movement as pt's quality of movement decreases w/increased speed.  ? ?Progressed to fwd walking alt. Lunges w/slam ball with 2KG weighted ball, x115' w/min guard. Min cues to slow down movement and maintain high velocity overhead throw. Noted increased instability when leading w/LLE.  ? ? ? ?  ?PATIENT EDUCATION: ?Education details: safety w/HEP at home, slowing down movement for  improved quality.  ?Person educated: Patient ?Education method: Explanation  ?Education comprehension: verbalized understanding ?  ?  ?HOME EXERCISE PROGRAM: ?Access Code: [email or web address] ?URL: [email or web address] ?Date: 08/03/2021 ?Prepared by: Mickie Bail Alece Koppel ? ?Exercises ?- Gastroc Stretch on Wall  - 1 x daily - 7 x weekly - 2 sets - 2 reps - 30 sec hold ?- Seated Ankle Dorsiflexion with Anchored Resistance  - 1 x daily - 7 x weekly - 3 sets - 10 reps ?- Eccentric Heel Lowering on Step  - 1 x daily - 7 x weekly - 3 sets - 10  reps ?- Forward Step Down  - 1 x daily - 7 x weekly - 3 sets - 10 reps ?- Squat Jumps  - 1 x daily - 7 x weekly - 3 sets - 10 reps ?- Kettlebell Deadlift  - 1 x daily - 7 x weekly - 3 sets - 10 reps-using just LUE ?  ?ASSESSMENT: ?  ?CLINICAL IMPRESSION: ?Emphasis of skilled PT session on gait training, UE/LE coordination and single leg stability. Pt demonstrates hip hiking and minor circumduction of LLE but improves hip/knee flexion with verbal cues. Pt will benefit from continued retro gait training on incline for improved eccentric L knee control. Pt required min cues throughout session to slow down movement, as he tends to rush and throw himself off balance with sports tasks. Continue POC.  ?  ?  ?OBJECTIVE IMPAIRMENTS Abnormal gait, decreased balance, decreased coordination, decreased knowledge of use of DME, difficulty walking, decreased ROM, decreased strength, hypomobility, and impaired flexibility.  ?  ?ACTIVITY LIMITATIONS cleaning, community activity, driving, laundry, yard work, and hobbies: golfing and bowling .  ?  ?PERSONAL FACTORS Age, Past/current experiences, and 3+ comorbidities:  chronic thrombocytopenia, BPH, hyperlipidemia, HN, AKI  are also affecting patient's functional outcome.  ?  ?  ?REHAB POTENTIAL: Excellent ?  ?CLINICAL DECISION MAKING: Stable/uncomplicated ?  ?EVALUATION COMPLEXITY: Low ?  ?  ?GOALS: ?Goals reviewed with patient? Yes ?  ?SHORT TERM GOALS: Target date:  08/18/2021 ?  ?Pt will be maintain initial strength and balance HEP with supervision from family. ?Baseline: Not initiated on eval. ?Goal status: INITIAL ?  ?2.  Pt will decrease 5xSTS to <13 seconds in order to demonstrate decreased risk for falls and improved functional bilateral LE strength and power.  ?Baseline: 15.15sec w/o UE from standard chair ?Goal status: INITIAL ?  ?3.  Pt will demonstrate improved LLE coordination and appropriate speed for safety with gait using LRAD.  ?Baseline: ataxic with increased speed  using R SPC w/ quad tip ?Goal status: INITIAL ?  ?4.  Pt will demonstrate TUG of <9 seconds in order to decrease risk of falls and improve functional mobility using LRAD. ?Baseline: increased ataxia with LOB due to speed using R SPC w/ quad tip ?Goal status: INITIAL ?  ?5.  FGA will be assessed and goal set on visit 2. ?           Baseline:  Assessed 07/27/2021 ?           Goal status:  Achieved ?  ?LONG TERM GOALS: Target date:  09/15/2021 ?  ?Pt will be independent with finalized strength and balance HEP. ?Baseline: Not initiated on eval ?Goal status: INITIAL ?  ?2.  Pt will ambulate>500 feet on 2MWT to demonstrate improved endurance for functional tasks in home and community.  ?Baseline: 75' w/ SPC w/ quad tip ?Goal status: INITIAL ?  ?3.  Pt will ambulate at least 600 feet  with LRAD at independent level of assist with safe speed and improved LLE placement to promote household and community access. ?Baseline: ataxia 385', scissoring of the LLE using SPC w/ quad tip ?Goal status: INITIAL ?  ?4.  Pt will improve FGA score to 25/30 in order to demonstrate improved balance and decreased fall risk. ?Baseline: 20/30 07/27/2021 ?Goal status: INITIAL ?  ?5.  Pt will improve 10MWT to >/=38msec or 3.3 ft/sec to promote safe access to community. ?Baseline: 2.78 ft/sec OR 0.84 m/sec ?Goal status: INITIAL ?  ?PLAN: ?PT FREQUENCY:  2x/wk for first 4 weeks followed by 1x/wk last 4 weeks ?  ?PT DURATION: 8 weeks ?  ?PLANNED INTERVENTIONS: Therapeutic exercises, Therapeutic activity, Neuromuscular re-education, Balance training, Gait training, Patient/Family education, Joint mobilization, Stair training, Vestibular training, Orthotic/Fit training, DME instructions, and Manual therapy ?  ?PLAN FOR NEXT SESSION: How is gym? Retro gait training on TM. Incorporate bowling activity-bowling ball weighs 15#, weighted rebounders, wall ball-progress to deeper squat 4kg ball, dual task, bilateral arm use to increased LUE proprioception,  billiards simulation/forward bend with BUE use; SciFit for aerobic health; gait training w/ cane w/ or w/o AFO, golf club swing using hand weight on LUE-outside ? ?JCruzita LedererPlaster, PT, DPT ?08/11/2021, 10:57 AM ?

## 2021-08-15 ENCOUNTER — Ambulatory Visit: Payer: Medicare Other | Admitting: Occupational Therapy

## 2021-08-15 ENCOUNTER — Ambulatory Visit: Payer: Medicare Other | Admitting: Physical Therapy

## 2021-08-15 ENCOUNTER — Encounter: Payer: Self-pay | Admitting: Occupational Therapy

## 2021-08-15 DIAGNOSIS — M6281 Muscle weakness (generalized): Secondary | ICD-10-CM

## 2021-08-15 DIAGNOSIS — R2689 Other abnormalities of gait and mobility: Secondary | ICD-10-CM | POA: Diagnosis not present

## 2021-08-15 DIAGNOSIS — I69352 Hemiplegia and hemiparesis following cerebral infarction affecting left dominant side: Secondary | ICD-10-CM

## 2021-08-15 DIAGNOSIS — R278 Other lack of coordination: Secondary | ICD-10-CM

## 2021-08-15 DIAGNOSIS — R2681 Unsteadiness on feet: Secondary | ICD-10-CM

## 2021-08-15 DIAGNOSIS — R270 Ataxia, unspecified: Secondary | ICD-10-CM

## 2021-08-15 NOTE — Therapy (Signed)
?OUTPATIENT PHYSICAL THERAPY TREATMENT NOTE ? ? ?Patient Name: Eddie Calhoun ?MRN: [id number] ?DOB:1951/03/10, 71 y.o., male ?Today's Date: 08/16/2021 ? ?PCP: Josetta Huddle, MD ?REFERRING PROVIDER: Josetta Huddle, MD ? ? PT End of Session - 08/15/21 1406   ? ? Visit Number 7   ? Number of Visits 13   12+eval  ? Date for PT Re-Evaluation 09/15/21   ? Authorization Type Medicare & Aarp Supplement  Covered 100%  VL: Follows Medicare guidelines-10th visit PN   ? Progress Note Due on Visit 10   ? PT Start Time 1443   ? PT Stop Time 1540   ? PT Time Calculation (min) 40 min   ? Equipment Utilized During Treatment Other (comment)   Pt's SPC w/ quad tip and L AFO  ? Activity Tolerance Patient tolerated treatment well   ? Behavior During Therapy [facility] for tasks assessed/performed   ? ?  ?  ? ?  ? ? ? ? ?Past Medical History:  ?Diagnosis Date  ? Allergy   ? allergic rhinitis  ? BPH (benign prostatic hyperplasia)   ? Elevated prostate specific antigen (PSA)   ? Hypertension   ? Plantar fasciitis of right foot   ? Pneumonia   ? Thrombocytopenia, unspecified ([place]) 07/26/2012  ? 03/24/12  129,000!  ? ?No past surgical history on file. ?Patient Active Problem List  ? Diagnosis Date Noted  ? 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 Acute ischemic stroke ([place]) 07/01/2021  ? Thrombocytopenia, unspecified ([place]) 07/26/2012  ? ? ?REFERRING DIAG: I63.9 (ICD-10-CM) - CVA (cerebral vascular accident) ([place])  ? ?THERAPY DIAG:  ?Other lack of coordination ? ?Unsteadiness on feet ? ?Muscle weakness (generalized) ? ?Other abnormalities of gait and mobility ? ?Hemiplegia and hemiparesis following cerebral infarction affecting left dominant side ([place]) ? ?PERTINENT HISTORY: Admitted to ED 07/01/2021 for R CVA w/ Left hemiparesis.  He had carpal tunnel surgery in early February 2023.  Discharged from inpatient rehab 07/17/2021.  ?PMH:  chronic thrombocytopenia, BPH, hyperlipidemia, HN, AKI ? ?PRECAUTIONS: Fall ? ?SUBJECTIVE: Pt has been walking without  cane, no falls.  Still uses cane at morning or in the dark at night just in case when going to the bathroom.  ? ?PAIN:  ?Are you having pain? No ? ? ?OBJECTIVE:  ?  ?DIAGNOSTIC FINDINGS: MRI 07/02/2021 showed Acute perforator infarct at the right basal ganglia and corona radiata. ?  ?TODAY'S TREATMENT: ?Pt standing on airex facing trampoline performing rebounder with alt single UE only with feet together using 3.3kg ball>lateral facing in tandem stance with trunk rotation and bilateral rebounder using 3.3kg ball ?In hallway pt stands facing 4" hurdle performing forward lunge over hurdle with alt trunk rotations with purple ball toss and catch to wall for added dual task>forward lunge over 4" hurdle using 5.5 lb ball to perform chest press and trunk rotation to each side with long lever arm to inc core engagement; CGA with fatigue-activity done to fatigue ?In // bars:  pt steps forward, backwards, and L & R over 8" hurdles using light BUE support on bars, cued for upright trunk ?LUE farmer's carry in gym on level surface using 15# kettlebell x115'>20# x115'; therapist provides facilitation at R hip and L shoulder for trunk upright and correct postural engagement ? ? ?  ?PATIENT EDUCATION: ?Education details: N/A ?Person educated: Patient ?Education method: Explanation  ?Education comprehension: verbalized understanding ?  ?  ?HOME EXERCISE PROGRAM: ?Access Code: [email or web address] ?URL: [email or web address] ?Date: 08/03/2021 ?Prepared by: Mickie Bail Plaster ? ?Exercises ?-  Gastroc Stretch on Wall  - 1 x daily - 7 x weekly - 2 sets - 2 reps - 30 sec hold ?- Seated Ankle Dorsiflexion with Anchored Resistance  - 1 x daily - 7 x weekly - 3 sets - 10 reps ?- Eccentric Heel Lowering on Step  - 1 x daily - 7 x weekly - 3 sets - 10 reps ?- Forward Step Down  - 1 x daily - 7 x weekly - 3 sets - 10 reps ?- Squat Jumps  - 1 x daily - 7 x weekly - 3 sets - 10 reps ?- Kettlebell Deadlift  - 1 x daily - 7 x weekly - 3 sets - 10  reps-using just LUE ?  ?ASSESSMENT: ?  ?CLINICAL IMPRESSION: ?Pt is making significant gains in PT demonstrating improved control of LLE and functional strength.  Session progressed to high level balance and strengthening to challenge dynamic knee, core, and hip stability.  Continue per POC. ?  ?  ?OBJECTIVE IMPAIRMENTS Abnormal gait, decreased balance, decreased coordination, decreased knowledge of use of DME, difficulty walking, decreased ROM, decreased strength, hypomobility, and impaired flexibility.  ?  ?ACTIVITY LIMITATIONS cleaning, community activity, driving, laundry, yard work, and hobbies: golfing and bowling .  ?  ?PERSONAL FACTORS Age, Past/current experiences, and 3+ comorbidities:  chronic thrombocytopenia, BPH, hyperlipidemia, HN, AKI  are also affecting patient's functional outcome.  ?  ?  ?REHAB POTENTIAL: Excellent ?  ?CLINICAL DECISION MAKING: Stable/uncomplicated ?  ?EVALUATION COMPLEXITY: Low ?  ?  ?GOALS: ?Goals reviewed with patient? Yes ?  ?SHORT TERM GOALS: Target date:  08/18/2021 ?  ?Pt will be maintain initial strength and balance HEP with supervision from family. ?Baseline: Not initiated on eval. ?Goal status: INITIAL ?  ?2.  Pt will decrease 5xSTS to <13 seconds in order to demonstrate decreased risk for falls and improved functional bilateral LE strength and power.  ?Baseline: 15.15sec w/o UE from standard chair ?Goal status: INITIAL ?  ?3.  Pt will demonstrate improved LLE coordination and appropriate speed for safety with gait using LRAD.  ?Baseline: ataxic with increased speed using R SPC w/ quad tip ?Goal status: INITIAL ?  ?4.  Pt will demonstrate TUG of <9 seconds in order to decrease risk of falls and improve functional mobility using LRAD. ?Baseline: increased ataxia with LOB due to speed using R SPC w/ quad tip ?Goal status: INITIAL ?  ?5.  FGA will be assessed and goal set on visit 2. ?           Baseline:  Assessed 07/27/2021 ?           Goal status:  Achieved ?  ?LONG TERM  GOALS: Target date:  09/15/2021 ?  ?Pt will be independent with finalized strength and balance HEP. ?Baseline: Not initiated on eval ?Goal status: INITIAL ?  ?2.  Pt will ambulate>500 feet on 2MWT to demonstrate improved endurance for functional tasks in home and community.  ?Baseline: 28' w/ SPC w/ quad tip ?Goal status: INITIAL ?  ?3.  Pt will ambulate at least 600 feet with LRAD at independent level of assist with safe speed and improved LLE placement to promote household and community access. ?Baseline: ataxia 385', scissoring of the LLE using SPC w/ quad tip ?Goal status: INITIAL ?  ?4.  Pt will improve FGA score to 25/30 in order to demonstrate improved balance and decreased fall risk. ?Baseline: 20/30 07/27/2021 ?Goal status: INITIAL ?  ?5.  Pt will improve 10MWT to >/=  26msec or 3.3 ft/sec to promote safe access to community. ?Baseline: 2.78 ft/sec OR 0.84 m/sec ?Goal status: INITIAL ?  ?PLAN: ?PT FREQUENCY:  2x/wk for first 4 weeks followed by 1x/wk last 4 weeks ?  ?PT DURATION: 8 weeks ?  ?PLANNED INTERVENTIONS: Therapeutic exercises, Therapeutic activity, Neuromuscular re-education, Balance training, Gait training, Patient/Family education, Joint mobilization, Stair training, Vestibular training, Orthotic/Fit training, DME instructions, and Manual therapy ?  ?PLAN FOR NEXT SESSION: Assess STGs-update LTGs as needed.  How is gym? Walking stairs w/ farmer carry on LUE, Retro gait training on TM- AFO donned, 1.116m, 1% incline from previous treadmill training. Incorporate bowling activity-bowling ball weighs 15#, weighted rebounders, wall ball-progress to deeper squat 4kg ball, dual task, bilateral arm use to increased LUE proprioception, billiards simulation/forward bend with BUE use; SciFit for aerobic health; gait training w/ cane w/ or w/o AFO, golf club swing using hand weight on LUE-outside ? ?MaBary RichardPT, DPT ?08/16/2021, 11:38 AM ? ?  ? ?

## 2021-08-15 NOTE — Addendum Note (Signed)
Addended by: Mariah Milling on: 08/15/2021 05:12 PM ? ? Modules accepted: Orders ? ?

## 2021-08-15 NOTE — Therapy (Signed)
?OUTPATIENT OCCUPATIONAL THERAPY TREATMENT NOTE AND RECERTIFICATION ? ? ?Patient Name: Eddie Calhoun ?MRN: [id number] ?DOB:04-19-1951, 71 y.o., male ?Today's Date: 08/15/2021 ? ?PCP: Josetta Huddle, MD ?REFERRING PROVIDER: Josetta Huddle, MD ? ? OT End of Session - 08/15/21 1615   ? ? Visit Number 8   ? Number of Visits 17   ? Date for OT Re-Evaluation 10/01/21   ? Authorization Type Medicare and AARP supplement   ? OT Start Time 4496   ? OT Stop Time 1530   ? OT Time Calculation (min) 45 min   ? Activity Tolerance Patient tolerated treatment well   ? Behavior During Therapy [facility] for tasks assessed/performed   ? ?  ?  ? ?  ? ? ? ?Past Medical History:  ?Diagnosis Date  ? Allergy   ? allergic rhinitis  ? BPH (benign prostatic hyperplasia)   ? Elevated prostate specific antigen (PSA)   ? Hypertension   ? Plantar fasciitis of right foot   ? Pneumonia   ? Thrombocytopenia, unspecified ([place]) 07/26/2012  ? 03/24/12  129,000!  ? ?History reviewed. No pertinent surgical history. ?Patient Active Problem List  ? Diagnosis Date Noted  ? 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 Acute ischemic stroke ([place]) 07/01/2021  ? Thrombocytopenia, unspecified ([place]) 07/26/2012  ? ? ?ONSET DATE: 07/01/21 ? ?REFERRING DIAG: corona radiata, right basal ganglia  ? ?THERAPY DIAG:  ?Unsteadiness on feet ? ?Muscle weakness (generalized) ? ?Hemiplegia and hemiparesis following cerebral infarction affecting left dominant side ([place]) ? ?Other lack of coordination ? ?Ataxia ? ? ?PERTINENT HISTORY: HTN ? ?PRECAUTIONS: Fall Risk ? ?SUBJECTIVE: I don't drink a full cup of coffee with left hand - I will drink it when it is halfway filled.   ?PAIN:  ?Are you having pain? No  ? ? ? ? ?OBJECTIVE:  ? ?TODAY'S TREATMENT:  ? ?08/15/21: ?Long discussion with patient about progress toward goals.  Patient has met all of his short term goals, and some of his long term goals.  Have revised/upgraded long term goals and added two add'l goals.  Patient has potential for  fully functional dominant LUE.  Patient is actively working on improving LUE functioning daily.  Still lacks sustained power, and is somewhat limited in situations where he needs UE/LE to work together - e.g. golf swing, bowling, carrying tray of food, etc.   ? ?Neuromuscular reeducation:  Worked on forearm plank (on knees versus toes) to address scapular stabilization and strengthening.   ? ? ? 08/11/21: ?Return to PLOF pt reports getting back to prior activities: bowling, golf, driving, going to the gym, etc. Pt reports doing some weights and exercises at the gym. OT suggested refraining from increased weights and resistance (bowling with LUE, weighted machines with LUE at gym, etc) with the LUE at this time d/t continued limited strength. Pt reports he does not have difficulty with cutting or dicing vegetables and does so with his LUE.  ? ?Mid/High Level Reaching with Resistance Clothespins 1-8# with LUE for mid and high functional reaching and sustained pinch. Pt req'd no additional cues for movement pattern and maintaining good positioning of LUE with reach. Pt worked on stepping to left for increased weight shifting bearing into LLE.  ? ?Dowel Stretches and exercises shoulder extension x 12, towel stretches with dowel for BUE. ? ?Arm Bike: for conditioning and reciprocal movements on Level 3.5 for 8 minutes (forward and backward) ? ? ? ? ? ? ? ? ? ?PATIENT EDUCATION: ?Education details: Education on  limited resistance/weight with LUE. ?Person educated: Patient ?Education method: Explanation ?Education comprehension: verbalized understanding and needs further education ? ? ? ? OT Short Term Goals - Met all STGs 08/03/21  ? ?  ? OT SHORT TERM GOAL #1  ? Title Patient will complete an HEP designed to improve coordination in LUE   ? Time 4   ? Period Weeks   ? Status Achieved  ? Target Date 09/01/21   ?  ? OT SHORT TERM GOAL #2  ? Title Patient will demonstrate ability to heat and serve simple warm meal for he and  his wife with intermittent support   ? Time 4   ? Period Weeks   ? Status Achieved   reported doing higher cooking last night 07/27/21, continues to report cooking independently 08/03/21  ?  ? OT SHORT TERM GOAL #3  ? Title Patient will feed himself with appropriate use of utensils in 30% less time without increased spills/drops   ? Time 4   ? Period Weeks   ? Status Achieved   pt reports using chopsticks and the right utensils  ?  ? OT SHORT TERM GOAL #4  ? Title Patient will report improved effectiveness and efficiency with brushing his teeth using dominant LUE   ? Time 4   ? Period Weeks   ? Status Achieved   pt reports doing this consistently 07/27/21, 08/03/21  ? ?  ?  ? ?  ? ? ? OT Long Term Goals - check by 10/01/21  ? ?  ? OT LONG TERM GOAL #1  ? Title Patient will complete Home Activity Program designed to improve his ability to use LUE as dominant side again.   ? Time 8   ? Period Weeks   ? Status Ongoing  ? Target Date 10/01/21   ?  ? OT LONG TERM GOAL #2  ? Title Patient will play a round of golf (9 holes - par 3) and use prior right hand swing.    ? Time 8   ? Period Weeks   ? Status Revised  ?  ? OT LONG TERM GOAL #3  ? Title Patient will be able to fully swing a golf club at 75% power  ? Time 8   ? Period Weeks   ? Status Revised  ?  ? OT LONG TERM GOAL #4  ? Title Patient will demonstrate awareness of return to driving recommendations   ? Time 8   ? Period Weeks   ? Status Achieved pt cleared by MD and is now driving  ?  ? OT LONG TERM GOAL #5  ? Title Patient will return to gardening - weeding, raking, hoeing with intermittent assistance   ? Time 8   ? Period Weeks   ? Status Ongoing pt reports mowing part of lawn with no difficulty  ?  ? Long Term Additional Goals  ? Additional Long Term Goals Yes   ?  ? OT LONG TERM GOAL #6  ? Title Patient will prepare and cook full meal for he and his wife - includes food prep - peeling, chopping, as well as cooking, and clean up.   ? Time 8   ? Period Weeks   ?  Status Achieved reports higher level cooking and prepping with no difficulty at home 08/11/21  ?OT LONG TERM GOAL #7 ?Title  #7 Patient will drink from a hot full mug of coffee or tea using his dominant left hand  ?Time  8   ?Period Weeks   ?Status New 10/01/21  ?OT LONG TERM GOAL #8 ?Title #8 Patient will demonstrate ability to carry a plate of food and cold drink across a crowded room using BUE without spilling.  ?Time 8   ?Period Weeks   ?Status New 10/01/21  ?  ?  ? ?  ? ? ? Plan -    ? ? Clinical Impression Statement Pt continues to improve functional use of LUE therefore long term goals revised and two new goals added.    ? OT Occupational Profile and History Detailed Assessment- Review of Records and additional review of physical, cognitive, psychosocial history related to current functional performance   ? Occupational performance deficits (Please refer to evaluation for details): ADL's;IADL's;Leisure   ? Body Structure / Function / Physical Skills ADL;Decreased knowledge of use of DME;Balance;Dexterity;GMC;Tone;Body mechanics;UE functional use;IADL;Coordination;Flexibility;Mobility;FMC   ? Rehab Potential Excellent   ? Clinical Decision Making Limited treatment options, no task modification necessary   ? Comorbidities Affecting Occupational Performance: May have comorbidities impacting occupational performance   ? Modification or Assistance to Complete Evaluation  No modification of tasks or assist necessary to complete eval   ? OT Frequency 2x / week   ? OT Duration 8 weeks   ? OT Treatment/Interventions Self-care/ADL training;Balance training;Aquatic Therapy;Therapeutic activities;Therapeutic exercise;Neuromuscular education;Functional Mobility Training;Patient/family education;DME and/or AE instruction   ? Plan review HEP, proximal strengthening, see new goals  ? Consulted and Agree with Plan of Care Patient   ? ?  ?  ? ?  ? ? ? ?Mariah Milling, OT ?08/15/2021, 4:16 PM ? ?  ? ? ? ?

## 2021-08-18 ENCOUNTER — Ambulatory Visit: Payer: Medicare Other | Admitting: Occupational Therapy

## 2021-08-18 ENCOUNTER — Encounter: Payer: Self-pay | Admitting: Physical Therapy

## 2021-08-18 ENCOUNTER — Encounter: Payer: Self-pay | Admitting: Occupational Therapy

## 2021-08-18 ENCOUNTER — Ambulatory Visit: Payer: Medicare Other | Admitting: Physical Therapy

## 2021-08-18 DIAGNOSIS — I69352 Hemiplegia and hemiparesis following cerebral infarction affecting left dominant side: Secondary | ICD-10-CM

## 2021-08-18 DIAGNOSIS — R2681 Unsteadiness on feet: Secondary | ICD-10-CM

## 2021-08-18 DIAGNOSIS — R278 Other lack of coordination: Secondary | ICD-10-CM

## 2021-08-18 DIAGNOSIS — R2689 Other abnormalities of gait and mobility: Secondary | ICD-10-CM | POA: Diagnosis not present

## 2021-08-18 DIAGNOSIS — M6281 Muscle weakness (generalized): Secondary | ICD-10-CM

## 2021-08-18 NOTE — Therapy (Signed)
?OUTPATIENT PHYSICAL THERAPY TREATMENT NOTE ? ? ?Patient Name: Eddie Calhoun ?MRN: [id number] ?DOB:1951-04-22, 71 y.o., male ?Today's Date: 08/18/2021 ? ?PCP: Josetta Huddle, MD ?REFERRING PROVIDER: Josetta Huddle, MD ? ? PT End of Session - 08/18/21 1021   ? ? Visit Number 8   ? Number of Visits 13   12+eval  ? Date for PT Re-Evaluation 09/15/21   ? Authorization Type Medicare & Aarp Supplement  Covered 100%  VL: Follows Medicare guidelines-10th visit PN   ? Progress Note Due on Visit 10   ? PT Start Time 1018   ? PT Stop Time 1100   ? PT Time Calculation (min) 42 min   ? Equipment Utilized During Treatment Other (comment);Gait belt   L AFO  ? Activity Tolerance Patient tolerated treatment well   ? Behavior During Therapy [facility] for tasks assessed/performed   ? ?  ?  ? ?  ? ? ? ? ?Past Medical History:  ?Diagnosis Date  ? Allergy   ? allergic rhinitis  ? BPH (benign prostatic hyperplasia)   ? Elevated prostate specific antigen (PSA)   ? Hypertension   ? Plantar fasciitis of right foot   ? Pneumonia   ? Thrombocytopenia, unspecified ([place]) 07/26/2012  ? 03/24/12  129,000!  ? ?History reviewed. No pertinent surgical history. ?Patient Active Problem List  ? Diagnosis Date Noted  ? 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 Acute ischemic stroke ([place]) 07/01/2021  ? Thrombocytopenia, unspecified ([place]) 07/26/2012  ? ? ?REFERRING DIAG: I63.9 (ICD-10-CM) - CVA (cerebral vascular accident) ([place])  ? ?THERAPY DIAG:  ?Unsteadiness on feet ? ?Muscle weakness (generalized) ? ?Hemiplegia and hemiparesis following cerebral infarction affecting left dominant side ([place]) ? ?Other lack of coordination ? ?PERTINENT HISTORY: Admitted to ED 07/01/2021 for R CVA w/ Left hemiparesis.  He had carpal tunnel surgery in early February 2023.  Discharged from inpatient rehab 07/17/2021.  ?PMH:  chronic thrombocytopenia, BPH, hyperlipidemia, HN, AKI ? ?PRECAUTIONS: Fall ? ?SUBJECTIVE: Pt is feeling stronger going up and down stairs.  He mowed his lawn using  a powered push behind mower and seated mower uphill.  He is going bowling after session.  He is working out at [employer].  He went golfing.  He feels though he is returning to activities he enjoys that he is only 70% back to the way he used to do them mostly because of the foot. ? ?PAIN:  ?Are you having pain? No ? ? ?OBJECTIVE:  ?  ?DIAGNOSTIC FINDINGS: MRI 07/02/2021 showed Acute perforator infarct at the right basal ganglia and corona radiata. ?  ?TODAY'S TREATMENT: ?Reassessed STGs.  See goal section below. ? ?Pt ambulates on level surface in gym 690' L AFO no AD SBA, pt catches L toe x2 no LOB, he increases pace with inc distance requiring intermittent cuing for appropriate pace for safety. ? ?In // bars:  Pt stands on flat top of BOSU holding level x1 min>squating no UE support x10 >holding level x1 min >squating no UE support x10 CGA; on dome side of BOSU pt performs 1x12 step up w/ opp hip drive using Bil fingertip support alt LE>alt toe taps to cone using single fingertip support bilaterally ? ? ? ?  ?PATIENT EDUCATION: ?Education details: Edu on not using push behind mower due to potential instability and injury.  Edu on wearing AFO when not in house unless working out at gym on bike or other equipment requiring greater ankle ROM. ?Person educated: Patient ?Education method: Explanation  ?Education  comprehension: verbalized understanding ?  ?  ?HOME EXERCISE PROGRAM: ?Access Code: [email or web address] ?URL: [email or web address] ?Date: 08/03/2021 ?Prepared by: Mickie Bail Plaster ? ?Exercises ?- Gastroc Stretch on Wall  - 1 x daily - 7 x weekly - 2 sets - 2 reps - 30 sec hold ?- Seated Ankle Dorsiflexion with Anchored Resistance  - 1 x daily - 7 x weekly - 3 sets - 10 reps ?- Eccentric Heel Lowering on Step  - 1 x daily - 7 x weekly - 3 sets - 10 reps ?- Forward Step Down  - 1 x daily - 7 x weekly - 3 sets - 10 reps ?- Squat Jumps  - 1 x daily - 7 x weekly - 3 sets - 10 reps ?- Kettlebell Deadlift  - 1 x  daily - 7 x weekly - 3 sets - 10 reps-using just LUE ?  ?ASSESSMENT: ?  ?CLINICAL IMPRESSION: ?Time spent reassessing short term goals during session with pt meeting 5 of 5 goals.  He is progressing his exercise routine outside of clinic at [employer] and compliant with HEP.  He is ambulating with improved LLE control and decreased ataxia as long as he is not fatigued.  He performs TUG in 8.25sec w/o AD using much improved mechanics and control from eval.   His 5xSTS improved from 15.15sec to 10.78sec w/o use of UE demonstrating improved LE strength and power.  He is progressing towards LTGs with some requiring revision to meet current functional level.  Continue per POC. ?  ?  ?OBJECTIVE IMPAIRMENTS Abnormal gait, decreased balance, decreased coordination, decreased knowledge of use of DME, difficulty walking, decreased ROM, decreased strength, hypomobility, and impaired flexibility.  ?  ?ACTIVITY LIMITATIONS cleaning, community activity, driving, laundry, yard work, and hobbies: golfing and bowling .  ?  ?PERSONAL FACTORS Age, Past/current experiences, and 3+ comorbidities:  chronic thrombocytopenia, BPH, hyperlipidemia, HN, AKI  are also affecting patient's functional outcome.  ?  ?  ?REHAB POTENTIAL: Excellent ?  ?CLINICAL DECISION MAKING: Stable/uncomplicated ?  ?EVALUATION COMPLEXITY: Low ?  ?  ?GOALS: ?Goals reviewed with patient? Yes ?  ?SHORT TERM GOALS: Target date:  08/18/2021 ?  ?Pt will be maintain initial strength and balance HEP with supervision from family. ?Baseline: Pt is performing HEP + SNAP fitness 08/18/2021 ?Goal status: MET ?  ?2.  Pt will decrease 5xSTS to <13 seconds in order to demonstrate decreased risk for falls and improved functional bilateral LE strength and power.  ?Baseline: 10.78sec w/o UE (UE crossed across chest) from standard chair height ?Goal status: MET ?  ?3.  Pt will demonstrate improved LLE coordination and appropriate speed for safety with gait using LRAD.  ?Baseline: L  AFO donned w/ improved L ataxia, pt able to maintain appropriate speed ?Goal status: MET ?  ?4.  Pt will demonstrate TUG of <9 seconds in order to decrease risk of falls and improve functional mobility using LRAD. ?Baseline: 8.25sec no AD 08/18/2021 w/ less LLE ataxia and improved control with speed w/o LOB ?Goal status: MET ?  ?5.  FGA will be assessed and goal set on visit 2. ?           Baseline:  Assessed 07/27/2021 ?           Goal status:  MET ?  ?LONG TERM GOALS: Target date:  09/15/2021 ?  ?Pt will be independent with finalized strength and balance HEP. ?Baseline: Not initiated on eval ?Goal status: INITIAL ?  ?2.  Pt will ambulate>500 feet  on 2MWT to demonstrate improved endurance for functional tasks in home and community.  ?Baseline: 6' w/ SPC w/ quad tip ?Goal status: INITIAL ?  ?3.  Pt will ambulate at least 1000 feet with LRAD at independent level of assist with safe speed and improved LLE placement to promote household and community access. ?Baseline: ataxia 385', scissoring of the LLE using SPC w/ quad tip;  ?Goal status: Revised 08/18/2021 ?  ?4.  Pt will improve FGA score to 25/30 in order to demonstrate improved balance and decreased fall risk. ?Baseline: 20/30 07/27/2021 ?Goal status: INITIAL ?  ?5.  Pt will improve 10MWT to >/=55msec or 3.3 ft/sec to promote safe access to community. ?Baseline: 2.78 ft/sec OR 0.84 m/sec ?Goal status: INITIAL ?  ?PLAN: ?PT FREQUENCY:  2x/wk for first 4 weeks followed by 1x/wk last 4 weeks ?  ?PT DURATION: 8 weeks ?  ?PLANNED INTERVENTIONS: Therapeutic exercises, Therapeutic activity, Neuromuscular re-education, Balance training, Gait training, Patient/Family education, Joint mobilization, Stair training, Vestibular training, Orthotic/Fit training, DME instructions, and Manual therapy ?  ?PLAN FOR NEXT SESSION: Assess STGs-update LTGs as needed.  Use leg press.  Walking stairs w/ farmer carry on LUE, Retro gait training on TM- AFO donned, 1.113m, 1% incline from  previous treadmill training. Incorporate bowling activity-bowling ball weighs 15#, weighted rebounders, wall ball-progress to deeper squat 4kg ball, dual task, bilateral arm use to increased LUE propriocepti

## 2021-08-18 NOTE — Therapy (Signed)
?OUTPATIENT OCCUPATIONAL THERAPY TREATMENT NOTE ? ? ?Patient Name: URIJAH ARKO ?MRN: [id number] ?DOB:1951/03/25, 71 y.o., male ?Today's Date: 08/18/2021 ? ?PCP: Josetta Huddle, MD ?REFERRING PROVIDER: Josetta Huddle, MD ? ? OT End of Session - 08/18/21 1059   ? ? Visit Number 9   ? Number of Visits 17   ? Date for OT Re-Evaluation 10/01/21   ? Authorization Type Medicare and AARP supplement   ? Progress Note Due on Visit 18   recert done at visit 8  ? OT Start Time 1100   ? OT Stop Time 1145   ? OT Time Calculation (min) 45 min   ? Activity Tolerance Patient tolerated treatment well   ? Behavior During Therapy [facility] for tasks assessed/performed   ? ?  ?  ? ?  ? ? ? ?Past Medical History:  ?Diagnosis Date  ? Allergy   ? allergic rhinitis  ? BPH (benign prostatic hyperplasia)   ? Elevated prostate specific antigen (PSA)   ? Hypertension   ? Plantar fasciitis of right foot   ? Pneumonia   ? Thrombocytopenia, unspecified ([place]) 07/26/2012  ? 03/24/12  129,000!  ? ?History reviewed. No pertinent surgical history. ?Patient Active Problem List  ? Diagnosis Date Noted  ? 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 Acute ischemic stroke ([place]) 07/01/2021  ? Thrombocytopenia, unspecified ([place]) 07/26/2012  ? ? ?ONSET DATE: 07/01/21 ? ?REFERRING DIAG: corona radiata, right basal ganglia  ? ?THERAPY DIAG:  ?Unsteadiness on feet ? ?Muscle weakness (generalized) ? ?Hemiplegia and hemiparesis following cerebral infarction affecting left dominant side ([place]) ? ?Other lack of coordination ? ? ?PERTINENT HISTORY: HTN ? ?PRECAUTIONS: Fall Risk ? ?SUBJECTIVE: "I hit golf balls" Pt reports about 60% of normal driving range. ? ?PAIN:  ?Are you having pain? No  ? ? ? ? ?OBJECTIVE:  ? ?TODAY'S TREATMENT: ? ?08/18/21 ?[employer] issued yellow theraband and exercises  ? ?Access Code: [email or web address] ?URL: [email or web address] ?Date: 08/18/2021 ?Prepared by: Waldo Laine ? ?Exercises ?- Standing Elbow Extension with Self-Anchored Resistance   - 1 x daily - 7 x weekly - 3 sets - 10 reps ?- Standing Elbow Flexion with Self-Anchored Resistance  - 1 x daily - 7 x weekly - 3 sets - 10 reps ?- Standing Shoulder Horizontal Abduction with Resistance  - 1 x daily - 7 x weekly - 3 sets - 10 reps ?- Standing Shoulder Flexion with Resistance  - 1 x daily - 7 x weekly - 3 sets - 10 reps ?- Standing Shoulder Diagonal Horizontal Abduction 60/120 Degrees with Resistance  - 1 x daily - 7 x weekly - 3 sets - 10 reps ?- Standing Single Arm Shoulder Abduction with Resistance  - 1 x daily - 7 x weekly - 3 sets - 10 reps ?- Single Arm Punch with Resistance  - 1 x daily - 7 x weekly - 3 sets - 10 reps ? ?Wall Push Ups x 10 with good form. ?Wall Stretches for LUE ? Spring Rod x 10 reps with min cues  ? Row with yellow theraband for LUE x 10 reps with double band ?Arm Bike: for conditioning and reciprocal movements on Level 3 for 6 minutes (forward and backward) ? ? ? ?  ? ?HOME EXERCISE PROGRAM ?08/18/21: Yellow [employer] Access Code: [email or web address] ? ? ? OT Short Term Goals - Met all STGs 08/03/21  ? ?  ? OT SHORT TERM GOAL #1  ? Title Patient will complete an HEP designed to improve coordination in LUE   ?  Time 4   ? Period Weeks   ? Status Achieved  ? Target Date 09/01/21   ?  ? OT SHORT TERM GOAL #2  ? Title Patient will demonstrate ability to heat and serve simple warm meal for he and his wife with intermittent support   ? Time 4   ? Period Weeks   ? Status Achieved   reported doing higher cooking last night 07/27/21, continues to report cooking independently 08/03/21  ?  ? OT SHORT TERM GOAL #3  ? Title Patient will feed himself with appropriate use of utensils in 30% less time without increased spills/drops   ? Time 4   ? Period Weeks   ? Status Achieved   pt reports using chopsticks and the right utensils  ?  ? OT SHORT TERM GOAL #4  ? Title Patient will report improved effectiveness and efficiency with brushing his teeth using dominant LUE   ? Time 4   ? Period Weeks   ? Status  Achieved   pt reports doing this consistently 07/27/21, 08/03/21  ? ?  ?  ? ?  ? ? ? OT Long Term Goals - check by 10/01/21  ? ?  ? OT LONG TERM GOAL #1  ? Title Patient will complete Home Activity Program designed to improve his ability to use LUE as dominant side again.   ? Time 8   ? Period Weeks   ? Status Ongoing  ? Target Date 10/01/21   ?  ? OT LONG TERM GOAL #2  ? Title Patient will play a round of golf (9 holes - par 3) and use prior right hand swing.    ? Time 8   ? Period Weeks   ? Status Revised  ?  ? OT LONG TERM GOAL #3  ? Title Patient will be able to fully swing a golf club at 75% power  ? Time 8   ? Period Weeks   ? Status Revised  ?  ? OT LONG TERM GOAL #4  ? Title Patient will demonstrate awareness of return to driving recommendations   ? Time 8   ? Period Weeks   ? Status Achieved pt cleared by MD and is now driving  ?  ? OT LONG TERM GOAL #5  ? Title Patient will return to gardening - weeding, raking, hoeing with intermittent assistance   ? Time 8   ? Period Weeks   ? Status Ongoing pt reports mowing part of lawn with no difficulty  ?  ? Long Term Additional Goals  ? Additional Long Term Goals Yes   ?  ? OT LONG TERM GOAL #6  ? Title Patient will prepare and cook full meal for he and his wife - includes food prep - peeling, chopping, as well as cooking, and clean up.   ? Time 8   ? Period Weeks   ? Status Achieved reports higher level cooking and prepping with no difficulty at home 08/11/21  ?OT LONG TERM GOAL #7 ?Title  #7 Patient will drink from a hot full mug of coffee or tea using his dominant left hand  ?Time 8   ?Period Weeks   ?Status New 10/01/21  ?OT LONG TERM GOAL #8 ?Title #8 Patient will demonstrate ability to carry a plate of food and cold drink across a crowded room using BUE without spilling.  ?Time 8   ?Period Weeks   ?Status New 10/01/21  ?  ?  ? ?  ? ? ?  Plan -    ? ? Clinical Impression Statement Pt with good form with introducing strengthening and resistance with theraband today.    ? OT Occupational Profile and History Detailed Assessment- Review of Records and additional review of physical, cognitive, psychosocial history related to current functional performance   ? Occupational performance deficits (Please refer to evaluation for details): ADL's;IADL's;Leisure   ? Body Structure / Function / Physical Skills ADL;Decreased knowledge of use of DME;Balance;Dexterity;GMC;Tone;Body mechanics;UE functional use;IADL;Coordination;Flexibility;Mobility;FMC   ? Rehab Potential Excellent   ? Clinical Decision Making Limited treatment options, no task modification necessary   ? Comorbidities Affecting Occupational Performance: May have comorbidities impacting occupational performance   ? Modification or Assistance to Complete Evaluation  No modification of tasks or assist necessary to complete eval   ? OT Frequency 2x / week   ? OT Duration 8 weeks   ? OT Treatment/Interventions Self-care/ADL training;Balance training;Aquatic Therapy;Therapeutic activities;Therapeutic exercise;Neuromuscular education;Functional Mobility Training;Patient/family education;DME and/or AE instruction   ? Plan review HEP for proximal strengthening, see new goals  ? Consulted and Agree with Plan of Care Patient   ? ?  ?  ? ?  ? ? ? ?Zachery Conch, OT ?08/18/2021, 11:59 AM ? ?  ? ? ? ?

## 2021-08-22 ENCOUNTER — Encounter: Payer: Self-pay | Admitting: Occupational Therapy

## 2021-08-22 ENCOUNTER — Ambulatory Visit: Payer: Medicare Other | Admitting: Occupational Therapy

## 2021-08-22 ENCOUNTER — Ambulatory Visit: Payer: Medicare Other | Admitting: Physical Therapy

## 2021-08-22 ENCOUNTER — Encounter: Payer: Self-pay | Admitting: Physical Therapy

## 2021-08-22 DIAGNOSIS — I69352 Hemiplegia and hemiparesis following cerebral infarction affecting left dominant side: Secondary | ICD-10-CM

## 2021-08-22 DIAGNOSIS — M6281 Muscle weakness (generalized): Secondary | ICD-10-CM

## 2021-08-22 DIAGNOSIS — R2689 Other abnormalities of gait and mobility: Secondary | ICD-10-CM | POA: Diagnosis not present

## 2021-08-22 DIAGNOSIS — R2681 Unsteadiness on feet: Secondary | ICD-10-CM

## 2021-08-22 DIAGNOSIS — R270 Ataxia, unspecified: Secondary | ICD-10-CM

## 2021-08-22 DIAGNOSIS — R278 Other lack of coordination: Secondary | ICD-10-CM

## 2021-08-22 NOTE — Therapy (Signed)
?OUTPATIENT PHYSICAL THERAPY TREATMENT NOTE ? ? ?Patient Name: Eddie Calhoun ?MRN: [id number] ?DOB:1950/05/13, 71 y.o., male ?Today's Date: 08/23/2021 ? ?PCP: Josetta Huddle, MD ?REFERRING PROVIDER: Josetta Huddle, MD ? ? PT End of Session - 08/22/21 1154   ? ? Visit Number 9   ? Number of Visits 13   12+eval  ? Date for PT Re-Evaluation 09/15/21   ? Authorization Type Medicare & Aarp Supplement  Covered 100%  VL: Follows Medicare guidelines-10th visit PN   ? Progress Note Due on Visit 10   ? PT Start Time 1153   ? PT Stop Time 1234   ? PT Time Calculation (min) 41 min   ? Equipment Utilized During Treatment Other (comment);Gait belt   L AFO  ? Activity Tolerance Patient tolerated treatment well   ? Behavior During Therapy [facility] for tasks assessed/performed   ? ?  ?  ? ?  ? ? ? ? ?Past Medical History:  ?Diagnosis Date  ? Allergy   ? allergic rhinitis  ? BPH (benign prostatic hyperplasia)   ? Elevated prostate specific antigen (PSA)   ? Hypertension   ? Plantar fasciitis of right foot   ? Pneumonia   ? Thrombocytopenia, unspecified ([place]) 07/26/2012  ? 03/24/12  129,000!  ? ?History reviewed. No pertinent surgical history. ?Patient Active Problem List  ? Diagnosis Date Noted  ? 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 Acute ischemic stroke ([place]) 07/01/2021  ? Thrombocytopenia, unspecified ([place]) 07/26/2012  ? ? ?REFERRING DIAG: I63.9 (ICD-10-CM) - CVA (cerebral vascular accident) ([place])  ? ?THERAPY DIAG:  ?Unsteadiness on feet ? ?Muscle weakness (generalized) ? ?Hemiplegia and hemiparesis following cerebral infarction affecting left dominant side ([place]) ? ?Other lack of coordination ? ?PERTINENT HISTORY: Admitted to ED 07/01/2021 for R CVA w/ Left hemiparesis.  He had carpal tunnel surgery in early February 2023.  Discharged from inpatient rehab 07/17/2021.  ?PMH:  chronic thrombocytopenia, BPH, hyperlipidemia, HN, AKI ? ?PRECAUTIONS: Fall ? ?SUBJECTIVE: He did some cycling at [employer], walking in his neighborhood, has  bowled and went to the golf course.  Able to go up the stairs in his home without using rail. ? ?PAIN:  ?Are you having pain? No ? ? ?OBJECTIVE:  ?  ?DIAGNOSTIC FINDINGS: MRI 07/02/2021 showed Acute perforator infarct at the right basal ganglia and corona radiata. ?  ?TODAY'S TREATMENT: ?Pt performs bilateral leg press of 60, 80, 90lbs x10 each; unilateral leg press on alt LE 2x10 45lbs, pt cued to extend to point just before hyperextension on LLE to encourage motor control, also cued to slow pace ?Farmer carry on LUE using 15# kettlebell going up and down 6" stairs 4x4 using no rail, pt uses inc time to complete task, cued to maintain forward lean as pt loses balance posteriorly x1 when descending requiring minA to recover ?Pt completes retro gait training on treadmill using BUE rails x8 mins, progressed to 1% incline at 9mh for 4 of 8 mins, cued to engage gluts and hamstrings to prevent catching toe and to promote step through past RLE ?Pt ambulates 810 outside onto grass with gold club, pt demonstrates golf swing progressing from CGA to supervision x12, he demos variance in swing follow-through but no LOB. ?  ?PATIENT EDUCATION: ?Education details: Edu on return to golfing from a balance and strength standpoint.  Recommended pt try his 2lb wrist weight on he LUE to see if it improves the variance in his swing due to proprioception.  Pt owns 2lb wrist weight  that he has been using at home following prior visits without issue. ?Person educated: Patient ?Education method: Explanation  ?Education comprehension: verbalized understanding ?  ?  ?HOME EXERCISE PROGRAM: ?Access Code: [email or web address] ?URL: [email or web address] ?Date: 08/03/2021 ?Prepared by: Mickie Bail Plaster ? ?Exercises ?- Gastroc Stretch on Wall  - 1 x daily - 7 x weekly - 2 sets - 2 reps - 30 sec hold ?- Seated Ankle Dorsiflexion with Anchored Resistance  - 1 x daily - 7 x weekly - 3 sets - 10 reps ?- Eccentric Heel Lowering on Step  - 1 x daily -  7 x weekly - 3 sets - 10 reps ?- Forward Step Down  - 1 x daily - 7 x weekly - 3 sets - 10 reps ?- Squat Jumps  - 1 x daily - 7 x weekly - 3 sets - 10 reps ?- Kettlebell Deadlift  - 1 x daily - 7 x weekly - 3 sets - 10 reps-using just LUE ?  ?ASSESSMENT: ?  ?CLINICAL IMPRESSION: ?Focus of skilled session on high level balance activities including retro gait training on incline and addressing balance with golf swing.  Pt continues to work on strengthening independently at home and is making functional progress tolerating increased weight training during session using farmer's carry on stairs.  He continues to progress towards LTGs, continue with POC. ?  ?  ?OBJECTIVE IMPAIRMENTS Abnormal gait, decreased balance, decreased coordination, decreased knowledge of use of DME, difficulty walking, decreased ROM, decreased strength, hypomobility, and impaired flexibility.  ?  ?ACTIVITY LIMITATIONS cleaning, community activity, driving, laundry, yard work, and hobbies: golfing and bowling .  ?  ?PERSONAL FACTORS Age, Past/current experiences, and 3+ comorbidities:  chronic thrombocytopenia, BPH, hyperlipidemia, HN, AKI  are also affecting patient's functional outcome.  ?  ?  ?REHAB POTENTIAL: Excellent ?  ?CLINICAL DECISION MAKING: Stable/uncomplicated ?  ?EVALUATION COMPLEXITY: Low ?  ?  ?GOALS: ?Goals reviewed with patient? Yes ?  ?SHORT TERM GOALS: Target date:  08/18/2021 ?  ?Pt will be maintain initial strength and balance HEP with supervision from family. ?Baseline: Pt is performing HEP + SNAP fitness 08/18/2021 ?Goal status: MET ?  ?2.  Pt will decrease 5xSTS to <13 seconds in order to demonstrate decreased risk for falls and improved functional bilateral LE strength and power.  ?Baseline: 10.78sec w/o UE (UE crossed across chest) from standard chair height ?Goal status: MET ?  ?3.  Pt will demonstrate improved LLE coordination and appropriate speed for safety with gait using LRAD.  ?Baseline: L AFO donned w/ improved L  ataxia, pt able to maintain appropriate speed ?Goal status: MET ?  ?4.  Pt will demonstrate TUG of <9 seconds in order to decrease risk of falls and improve functional mobility using LRAD. ?Baseline: 8.25sec no AD 08/18/2021 w/ less LLE ataxia and improved control with speed w/o LOB ?Goal status: MET ?  ?5.  FGA will be assessed and goal set on visit 2. ?           Baseline:  Assessed 07/27/2021 ?           Goal status:  MET ?  ?LONG TERM GOALS: Target date:  09/15/2021 ?  ?Pt will be independent with finalized strength and balance HEP. ?Baseline: Not initiated on eval ?Goal status: INITIAL ?  ?2.  Pt will ambulate>500 feet on 2MWT to demonstrate improved endurance for functional tasks in home and community.  ?Baseline: 30' w/ SPC w/ quad tip ?Goal status: INITIAL ?  ?3.  Pt will ambulate at least 1000 feet with LRAD at independent level of assist with safe speed and improved LLE placement to promote household and community access. ?Baseline: ataxia 385', scissoring of the LLE using SPC w/ quad tip;  ?Goal status: Revised 08/18/2021 ?  ?4.  Pt will improve FGA score to 25/30 in order to demonstrate improved balance and decreased fall risk. ?Baseline: 20/30 07/27/2021 ?Goal status: INITIAL ?  ?5.  Pt will improve 10MWT to >/=65msec or 3.3 ft/sec to promote safe access to community. ?Baseline: 2.78 ft/sec OR 0.84 m/sec ?Goal status: INITIAL ?  ?PLAN: ?PT FREQUENCY:  2x/wk for first 4 weeks followed by 1x/wk last 4 weeks ?  ?PT DURATION: 8 weeks ?  ?PLANNED INTERVENTIONS: Therapeutic exercises, Therapeutic activity, Neuromuscular re-education, Balance training, Gait training, Patient/Family education, Joint mobilization, Stair training, Vestibular training, Orthotic/Fit training, DME instructions, and Manual therapy ?  ?PLAN FOR NEXT SESSION: Continue use of leg press.  Continue retro/fwd/sideways gait training on TM- AFO donned, 179m, 1% incline from previous treadmill training.  Try squats on large tilt board with  weighted ball and tandem weight shifting on tilt board. Incorporate bowling activity-bowling ball weighs 15#, weighted rebounders, wall ball-progress to deeper squat 4kg ball, dual task, bilateral arm use to in

## 2021-08-22 NOTE — Therapy (Signed)
?OUTPATIENT OCCUPATIONAL THERAPY TREATMENT NOTE ? ? ?Patient Name: DETROIT FRIEDEN ?MRN: [id number] ?DOB:Jun 25, 1950, 71 y.o., male ?Today's Date: 08/22/2021 ? ?PCP: Josetta Huddle, MD ?REFERRING PROVIDER: Josetta Huddle, MD ? ? OT End of Session - 08/22/21 1413   ? ? Visit Number 10   ? Number of Visits 17   ? Date for OT Re-Evaluation 10/01/21   ? Authorization Type Medicare and AARP supplement   ? Progress Note Due on Visit 18   ? OT Start Time 1105   ? OT Stop Time 1145   ? OT Time Calculation (min) 40 min   ? Activity Tolerance Patient tolerated treatment well   ? Behavior During Therapy [facility] for tasks assessed/performed   ? ?  ?  ? ?  ? ? ? ?Past Medical History:  ?Diagnosis Date  ? Allergy   ? allergic rhinitis  ? BPH (benign prostatic hyperplasia)   ? Elevated prostate specific antigen (PSA)   ? Hypertension   ? Plantar fasciitis of right foot   ? Pneumonia   ? Thrombocytopenia, unspecified ([place]) 07/26/2012  ? 03/24/12  129,000!  ? ?History reviewed. No pertinent surgical history. ?Patient Active Problem List  ? Diagnosis Date Noted  ? 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 Acute ischemic stroke ([place]) 07/01/2021  ? Thrombocytopenia, unspecified ([place]) 07/26/2012  ? ? ?ONSET DATE: 07/01/21 ? ?REFERRING DIAG: corona radiata, right basal ganglia  ? ?THERAPY DIAG:  ?Hemiplegia and hemiparesis following cerebral infarction affecting left dominant side ([place]) ? ?Other lack of coordination ? ?Ataxia ? ?Muscle weakness (generalized) ? ?Unsteadiness on feet ? ? ?PERTINENT HISTORY: HTN ? ?PRECAUTIONS: Fall Risk ? ?SUBJECTIVE: "I hit golf balls" Pt reports about 60% of normal driving range. ? ?PAIN:  ?Are you having pain? No  ? ? ? ? ?OBJECTIVE:  ? ?TODAY'S TREATMENT: ?08/22/21 ?Self care skills - Patient reported some difficulty with hand to mouth pattern with utensils in left dominant hand.  Had patient demonstrate - and patient with significantly abducted/ elevated shoulder then less control with distal LUE.  Patient  instructed to tuck elbow into body and immediately reported improved control of utensil.   ?Worked on goal of drinking/carrying full coffee mug.  Patient spilling every third step.  Patient with sufficient strength and control to hold mug by handle with LUE, but had difficulty when walking. Worked to realign trunk over base of support during functional walking.   ? ? ?08/18/21 ?Ther[employer] issued yellow theraband and exercises  ? ?Access Code: [email or web address] ?URL: [email or web address] ?Date: 08/18/2021 ?Prepared by: Waldo Laine ? ?Exercises ?- Standing Elbow Extension with Self-Anchored Resistance  - 1 x daily - 7 x weekly - 3 sets - 10 reps ?- Standing Elbow Flexion with Self-Anchored Resistance  - 1 x daily - 7 x weekly - 3 sets - 10 reps ?- Standing Shoulder Horizontal Abduction with Resistance  - 1 x daily - 7 x weekly - 3 sets - 10 reps ?- Standing Shoulder Flexion with Resistance  - 1 x daily - 7 x weekly - 3 sets - 10 reps ?- Standing Shoulder Diagonal Horizontal Abduction 60/120 Degrees with Resistance  - 1 x daily - 7 x weekly - 3 sets - 10 reps ?- Standing Single Arm Shoulder Abduction with Resistance  - 1 x daily - 7 x weekly - 3 sets - 10 reps ?- Single Arm Punch with Resistance  - 1 x daily - 7 x weekly - 3 sets - 10 reps ? ?Wall Push Ups x  10 with good form. ?Wall Stretches for LUE ? Spring Rod x 10 reps with min cues  ? Row with yellow theraband for LUE x 10 reps with double band ?Arm Bike: for conditioning and reciprocal movements on Level 3 for 6 minutes (forward and backward) ? ? ? ?  ? ?HOME EXERCISE PROGRAM ?08/18/21: Yellow Ther[employer] Access Code: [email or web address] ? ? ? OT Short Term Goals - Met all STGs 08/03/21  ? ?  ? OT SHORT TERM GOAL #1  ? Title Patient will complete an HEP designed to improve coordination in LUE   ? Time 4   ? Period Weeks   ? Status Achieved  ? Target Date 09/01/21   ?  ? OT SHORT TERM GOAL #2  ? Title Patient will demonstrate ability to heat and serve simple warm  meal for he and his wife with intermittent support   ? Time 4   ? Period Weeks   ? Status Achieved   reported doing higher cooking last night 07/27/21, continues to report cooking independently 08/03/21  ?  ? OT SHORT TERM GOAL #3  ? Title Patient will feed himself with appropriate use of utensils in 30% less time without increased spills/drops   ? Time 4   ? Period Weeks   ? Status Achieved   pt reports using chopsticks and the right utensils  ?  ? OT SHORT TERM GOAL #4  ? Title Patient will report improved effectiveness and efficiency with brushing his teeth using dominant LUE   ? Time 4   ? Period Weeks   ? Status Achieved   pt reports doing this consistently 07/27/21, 08/03/21  ? ?  ?  ? ?  ? ? ? OT Long Term Goals - check by 10/01/21  ? ?  ? OT LONG TERM GOAL #1  ? Title Patient will complete Home Activity Program designed to improve his ability to use LUE as dominant side again.   ? Time 8   ? Period Weeks   ? Status Ongoing  ? Target Date 10/01/21   ?  ? OT LONG TERM GOAL #2  ? Title Patient will play a round of golf (9 holes - par 3) and use prior right hand swing.    ? Time 8   ? Period Weeks   ? Status Revised  ?  ? OT LONG TERM GOAL #3  ? Title Patient will be able to fully swing a golf club at 75% power  ? Time 8   ? Period Weeks   ? Status Revised  ?  ? OT LONG TERM GOAL #4  ? Title Patient will demonstrate awareness of return to driving recommendations   ? Time 8   ? Period Weeks   ? Status Achieved pt cleared by MD and is now driving  ?  ? OT LONG TERM GOAL #5  ? Title Patient will return to gardening - weeding, raking, hoeing with intermittent assistance   ? Time 8   ? Period Weeks   ? Status Ongoing pt reports mowing part of lawn with no difficulty  ?  ? Long Term Additional Goals  ? Additional Long Term Goals Yes   ?  ? OT LONG TERM GOAL #6  ? Title Patient will prepare and cook full meal for he and his wife - includes food prep - peeling, chopping, as well as cooking, and clean up.   ? Time 8   ?  Period Weeks   ?  Status Achieved reports higher level cooking and prepping with no difficulty at home 08/11/21  ?OT LONG TERM GOAL #7 ?Title  #7 Patient will drink from a hot full mug of coffee or tea using his dominant left hand  ?Time 8   ?Period Weeks   ?Status New 10/01/21  ?OT LONG TERM GOAL #8 ?Title #8 Patient will demonstrate ability to carry a plate of food and cold drink across a crowded room using BUE without spilling.  ?Time 8   ?Period Weeks   ?Status New 10/01/21  ?  ?  ? ?  ? ? ? Plan -    ? ? Clinical Impression Statement Pt has met all short term goals and is working toward upgraded and remaining long term goals.  Patient pleased with progress and eager for even more natural use of LUE/LLE.    ? OT Occupational Profile and History Detailed Assessment- Review of Records and additional review of physical, cognitive, psychosocial history related to current functional performance   ? Occupational performance deficits (Please refer to evaluation for details): ADL's;IADL's;Leisure   ? Body Structure / Function / Physical Skills ADL;Decreased knowledge of use of DME;Balance;Dexterity;GMC;Tone;Body mechanics;UE functional use;IADL;Coordination;Flexibility;Mobility;FMC   ? Rehab Potential Excellent   ? Clinical Decision Making Limited treatment options, no task modification necessary   ? Comorbidities Affecting Occupational Performance: May have comorbidities impacting occupational performance   ? Modification or Assistance to Complete Evaluation  No modification of tasks or assist necessary to complete eval   ? OT Frequency 2x / week   ? OT Duration 8 weeks   ? OT Treatment/Interventions Self-care/ADL training;Balance training;Aquatic Therapy;Therapeutic activities;Therapeutic exercise;Neuromuscular education;Functional Mobility Training;Patient/family education;DME and/or AE instruction   ? Plan review HEP for proximal strengthening, see new goals  ? Consulted and Agree with Plan of Care Patient   ? ?  ?   ? ?  ? ? ? ?Mariah Milling, OT ?08/22/2021, 2:15 PM ? ?  ? ? ? ?

## 2021-08-23 ENCOUNTER — Encounter: Payer: Self-pay | Admitting: Physical Medicine and Rehabilitation

## 2021-08-23 ENCOUNTER — Encounter: Payer: Medicare Other | Attending: Registered Nurse | Admitting: Physical Medicine and Rehabilitation

## 2021-08-23 VITALS — BP 128/82 | HR 64 | Ht 70.0 in | Wt 188.2 lb

## 2021-08-23 DIAGNOSIS — M21372 Foot drop, left foot: Secondary | ICD-10-CM | POA: Insufficient documentation

## 2021-08-23 DIAGNOSIS — I639 Cerebral infarction, unspecified: Secondary | ICD-10-CM | POA: Diagnosis present

## 2021-08-23 DIAGNOSIS — I6381 Other cerebral infarction due to occlusion or stenosis of small artery: Secondary | ICD-10-CM | POA: Diagnosis present

## 2021-08-23 NOTE — Patient Instructions (Addendum)
Pt is a 71 yr old male with R BG/corona radiata lacunar infarct in March 2023- with L hemiparesis; HTN, chronic thrombocytopenia and BPH and recent B/L carpal tunnel surgeries;  ?Here for f/u on Stroke ? ? ?Try Foot up brace- [place]ur is best, but can use any foot foot up brace- doesn't need Saebo. Wear instead of Carbon fiber AFO- goal to wean off Carbon fiber AFO and use foot up brace.  ? ?2. Con't Outpt PT and OT- to work on higher level function. Have them let me know if needs more after current Rx runs out.  ? ?3. Developing very slight spasticity- since spasticity is not impairing function or causing pain, will wait on medication.  ?If spasticity gets worse- don't wait to call me- call and will start meds to treat-  ? ?4.  Spasticity can get worse for 1-2 years past the stroke- but I think we have 50/50 chance of yours getting worse and unlikely to get real bad. Looks for signs -I/e function gets worse.  ? ?5.  F/U with Neuro about secondary stroke prevention ? ? ?6. F/U in 3 months to f/u on spasticity ? ? ?7. Has changed diet to more healthy diet- eliminated fried food. Can have a beer now and then- no more than 2x/week.  ?

## 2021-08-23 NOTE — Progress Notes (Signed)
? ?Subjective:  ? ? Patient ID: Eddie Calhoun, male    DOB: [birth year], 71 y.o.   MRN: [id number] ? ?HPI ?Pt is a 71 yr old male with R BG/corona radiata lacunar infarct in March 2023- with L hemiparesis; HTN, chronic thrombocytopenia and BPH and recent B/L carpal tunnel surgeries;  ?Here for f/u on Stroke ? ? ?Drove here today- by himself- for past 1 week.  ?No problems so far ?Walking without Cane, but using L Carbon Fiber AFO ?Getting L great toe and 2nd toe movement as well as good ankle movement.  ? ?Dong outpt PT and OT- 2x/week each ?Gone out and hit golf balls- can hit 125 yards (used to do 200 yards) and working bowling and playing pool.  ? ?Bowling 15 lbs- bowel 2 handed- difficulty getting step in.  ?Hard to get AFO in shoe with bowling shoes- ? ?Doesn't do music/instruments- ?Working on cooking- going better.  ? ?Going to gym as well- 2x/week ? ?Hasn't seen Neurology- appointment beginning of May-  ? ? ?Has some spasticity- a little tight in certain positions.  ?Walking does more rotation/movement with walking- not as smooth- working on that as well.  ? ? ?Put back on Olmesartan- 10 mg daily- now- BP is well controlled.  ?Checking BP 2x/day- looks 110s/120s.  ? ?Has also lost 10-13 lbs - of note- is 188 lbs today with AFO, clothes and phone on.  ? ?Pain Inventory ?Average Pain 0 ?Pain Right Now 0 ?My pain is  n/a ? ?LOCATION OF PAIN  n/a  ? ?BOWEL ?Number of stools per week: 7 ? ? ?BLADDER ?Normal ? ? ? ?Mobility ?walk without assistance ?how many minutes can you walk? 15 ?ability to climb steps?  yes ?do you drive?  yes ?Do you have any goals in this area?  yes ? ?Function ?retired ?Do you have any goals in this area?  yes ? ?Neuro/Psych ?No problems in this area ? ?Prior Studies ?Any changes since last visit?  no ? ?Physicians involved in your care ?Any changes since last visit?  no ? ? ?Family History  ?Problem Relation Age of Onset  ? Hypertension Mother   ? Hypertension Father   ? Cancer Father 49   ?     father died with hx of prostate cancer  ? ?Social History  ? ?Socioeconomic History  ? Marital status: Married  ?  Spouse name: Not on file  ? Number of children: Not on file  ? Years of education: Not on file  ? Highest education level: Not on file  ?Occupational History  ? Not on file  ?Tobacco Use  ? Smoking status: Former  ?  Packs/day: 2.00  ?  Years: 20.00  ?  Pack years: 40.00  ?  Types: Cigarettes  ?  Quit date: 09/21/1987  ?  Years since quitting: 33.9  ? Smokeless tobacco: Never  ?Vaping Use  ? Vaping Use: Never used  ?Substance and Sexual Activity  ? Alcohol use: No  ? Drug use: Never  ? Sexual activity: Not Currently  ?Other Topics Concern  ? Not on file  ?Social History Narrative  ? Not on file  ? ?Social Determinants of Health  ? ?Financial Resource Strain: Not on file  ?Food Insecurity: Not on file  ?Transportation Needs: Not on file  ?Physical Activity: Not on file  ?Stress: Not on file  ?Social Connections: Not on file  ? ?History reviewed. No pertinent surgical history. ?Past Medical History:  ?Diagnosis  Date  ? Allergy   ? allergic rhinitis  ? BPH (benign prostatic hyperplasia)   ? Elevated prostate specific antigen (PSA)   ? Hypertension   ? Plantar fasciitis of right foot   ? Pneumonia   ? Thrombocytopenia, unspecified ([place]) 07/26/2012  ? 03/24/12  129,000!  ? ?BP 128/82   Pulse 64   Ht '5\' 10"'$  (1.778 m)   Wt 188 lb 3.2 oz (85.4 kg)   SpO2 99%   BMI 27.00 kg/m?  ? ?Opioid Risk Score:   ?Fall Risk Score:  `1 ? ?Depression screen PHQ 2/9 ? ? ?  08/23/2021  ? 10:21 AM 07/28/2021  ?  1:14 PM  ?Depression screen PHQ 2/9  ?Decreased Interest 0 0  ?Down, Depressed, Hopeless 0 0  ?PHQ - 2 Score 0 0  ?Altered sleeping  0  ?Tired, decreased energy  1  ?Change in appetite  0  ?Feeling bad or failure about yourself   0  ?Trouble concentrating  0  ?Moving slowly or fidgety/restless  3  ?Suicidal thoughts  0  ?PHQ-9 Score  4  ?  ? ?Review of Systems  ?Constitutional: Negative.   ?HENT: Negative.     ?Eyes: Negative.   ?Respiratory: Negative.    ?Cardiovascular: Negative.   ?Gastrointestinal: Negative.   ?Endocrine: Negative.   ?Genitourinary: Negative.   ?Musculoskeletal: Negative.   ?Skin: Negative.   ?Allergic/Immunologic: Negative.   ?Neurological: Negative.   ?Hematological: Negative.   ?Psychiatric/Behavioral: Negative.    ? ?   ?Objective:  ? Physical Exam ?Awake, alert, walking with no AD; appropriate, NAD; sitting on table comfortable;  ?Neuro: ?Hoffman's LUE (+) ?2-3 beats clonus LLE ?MAS of 1 to 1+ in L shoulder and L hip- otherwise no tone felt on exam ? ?LUE 5-/5 in biceps, triceps, WE;  grip and FA ?LLE: ?HF 4+/5; KE 5-/5; DF 4-/5; and PF 4/5 ? ?Can move each finger on L hand individually and pretty fast.  ?Can touch thumb to every digit- but a little slow ?   ?Assessment & Plan:  ? ?Pt is a 71 yr old male with R BG/corona radiata lacunar infarct in March 2023- with L hemiparesis; HTN, chronic thrombocytopenia and BPH and recent B/L carpal tunnel surgeries;  ?Here for f/u on Stroke ? ? ?Try Foot up brace- [place]ur is best, but can use any foot foot up brace- doesn't need Saebo. Wear instead of Carbon fiber AFO- goal to wean off Carbon fiber AFO and use foot up brace.  ? ?2. Con't Outpt PT and OT- to work on higher level function. Have them let me know if needs more after current Rx runs out.  ? ?3. Developing very slight spasticity- since spasticity is not impairing function or causing pain, will wait on medication.  ?If spasticity gets worse- don't wait to call me- call and will start meds to treat-  ? ?4.  Spasticity can get worse for 1-2 years past the stroke- but I think we have 50/50 chance of yours getting worse and unlikely to get real bad. Looks for signs -I/e function gets worse.  ? ?5.  F/U with Neuro about secondary stroke prevention ? ? ?6. F/U in 3 months to f/u on spasticity ? ?7. Has changed diet to more healthy diet- eliminated fried food. Can have a beer now and then- no more than  2x/week. ? ? ?I spent a total of  33  minutes on total care today- >50% coordination of care- due to discussion on  multiple issues as above and spasticity.  ? ?

## 2021-08-25 ENCOUNTER — Ambulatory Visit: Payer: PRIVATE HEALTH INSURANCE | Admitting: Physical Therapy

## 2021-08-25 ENCOUNTER — Encounter: Payer: Self-pay | Admitting: Occupational Therapy

## 2021-08-25 ENCOUNTER — Ambulatory Visit: Payer: Medicare Other | Admitting: Occupational Therapy

## 2021-08-25 DIAGNOSIS — R278 Other lack of coordination: Secondary | ICD-10-CM

## 2021-08-25 DIAGNOSIS — M6281 Muscle weakness (generalized): Secondary | ICD-10-CM

## 2021-08-25 DIAGNOSIS — R2681 Unsteadiness on feet: Secondary | ICD-10-CM

## 2021-08-25 DIAGNOSIS — I69352 Hemiplegia and hemiparesis following cerebral infarction affecting left dominant side: Secondary | ICD-10-CM

## 2021-08-25 DIAGNOSIS — R2689 Other abnormalities of gait and mobility: Secondary | ICD-10-CM

## 2021-08-25 NOTE — Therapy (Signed)
?OUTPATIENT OCCUPATIONAL THERAPY TREATMENT NOTE ? ? ?Patient Name: Eddie Calhoun ?MRN: [id number] ?DOB:[birth year], 71 y.o., male ?Today's Date: 08/25/2021 ? ?PCP: Josetta Huddle, MD ?REFERRING PROVIDER: Josetta Huddle, MD ? ? OT End of Session - 08/25/21 1059   ? ? Visit Number 11   ? Number of Visits 17   ? Date for OT Re-Evaluation 10/01/21   ? Authorization Type Medicare and AARP supplement   ? Progress Note Due on Visit 18   ? OT Start Time 1058   ? OT Stop Time 1140   ? OT Time Calculation (min) 42 min   ? Activity Tolerance Patient tolerated treatment well   ? Behavior During Therapy [facility] for tasks assessed/performed   ? ?  ?  ? ?  ? ? ? ?Past Medical History:  ?Diagnosis Date  ? Allergy   ? allergic rhinitis  ? BPH (benign prostatic hyperplasia)   ? Elevated prostate specific antigen (PSA)   ? Hypertension   ? Plantar fasciitis of right foot   ? Pneumonia   ? Thrombocytopenia, unspecified ([place]) 07/26/2012  ? 03/24/12  129,000!  ? ?History reviewed. No pertinent surgical history. ?Patient Active Problem List  ? Diagnosis Date Noted  ? Left foot drop 08/23/2021  ? 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 Acute ischemic stroke ([place]) 07/01/2021  ? Thrombocytopenia, unspecified ([place]) 07/26/2012  ? ? ?ONSET DATE: 07/01/21 ? ?REFERRING DIAG: corona radiata, right basal ganglia  ? ?THERAPY DIAG:  ?Unsteadiness on feet ? ?Muscle weakness (generalized) ? ?Hemiplegia and hemiparesis following cerebral infarction affecting left dominant side ([place]) ? ?Other lack of coordination ? ?Other abnormalities of gait and mobility ? ? ?PERTINENT HISTORY: HTN ? ?PRECAUTIONS: Fall Risk ? ?SUBJECTIVE: "I'm trying to check in for my appt Tuesday" ? ?PAIN:  ?Are you having pain? No  ? ? ? ? ?OBJECTIVE:  ? ?TODAY'S TREATMENT: ? ?08/25/21  ?Pt reports continued difficulty with holding coffee mug and spilling - pt reports increased ease with hand to mouth pattern with tucking elbow and shoulder stability.  ? ?Pt reports going to the gym yesterday  doing LB leg press, crunches and bicep curls, and did the rowing machine.  ? ?Equalizer to simulate exercises doing at the gym with similar machine with rows, chest press, bent over row and lat pull downs. Encouraged patient to avoid doing any resistance over 15-20 lbs and signs to look for compensations and improper positioning. Pt verbalized understanding and demonstrated understanding of posture and positioning.  ? ?Dual Task with toss ball and ambulating around clinic with good attention to task and no LOB.  ? ?Pt reports doing a little more weeding and working in the garden but has not been doing much. Reports doing about 1/2 a lawn and then have to stop. LTG #5. ? ?  ? ?HOME EXERCISE PROGRAM ? ?08/18/21: Yellow Theraband Access Code: [email or web address] ? ? ? OT Short Term Goals - Met all STGs 08/03/21  ? ?  ? OT SHORT TERM GOAL #1  ? Title Patient will complete an HEP designed to improve coordination in LUE   ? Time 4   ? Period Weeks   ? Status Achieved  ? Target Date 09/01/21   ?  ? OT SHORT TERM GOAL #2  ? Title Patient will demonstrate ability to heat and serve simple warm meal for he and his wife with intermittent support   ? Time 4   ? Period Weeks   ? Status Achieved   reported doing higher  cooking last night 07/27/21, continues to report cooking independently 08/03/21  ?  ? OT SHORT TERM GOAL #3  ? Title Patient will feed himself with appropriate use of utensils in 30% less time without increased spills/drops   ? Time 4   ? Period Weeks   ? Status Achieved   pt reports using chopsticks and the right utensils  ?  ? OT SHORT TERM GOAL #4  ? Title Patient will report improved effectiveness and efficiency with brushing his teeth using dominant LUE   ? Time 4   ? Period Weeks   ? Status Achieved   pt reports doing this consistently 07/27/21, 08/03/21  ? ?  ?  ? ?  ? ? ? OT Long Term Goals - check by 10/01/21  ? ?  ? OT LONG TERM GOAL #1  ? Title Patient will complete Home Activity Program designed to improve his ability to  use LUE as dominant side again.   ? Time 8   ? Period Weeks   ? Status Ongoing  ? Target Date 10/01/21   ?  ? OT LONG TERM GOAL #2  ? Title Patient will play a round of golf (9 holes - par 3) and use prior right hand swing.    ? Time 8   ? Period Weeks   ? Status ONGOING  ?  ? OT LONG TERM GOAL #3  ? Title Patient will be able to fully swing a golf club at 75% power  ? Time 8   ? Period Weeks   ? Status ONGOING approx 60% of power  ?  ? OT LONG TERM GOAL #4  ? Title Patient will demonstrate awareness of return to driving recommendations   ? Time 8   ? Period Weeks   ? Status Achieved pt cleared by MD and is now driving  ?  ? OT LONG TERM GOAL #5  ? Title Patient will return to gardening - weeding, raking, hoeing with intermittent assistance   ? Time 8   ? Period Weeks   ? Status Ongoing pt reports mowing part of lawn with no difficulty  ?  ? Long Term Additional Goals  ? Additional Long Term Goals Yes   ?  ? OT LONG TERM GOAL #6  ? Title Patient will prepare and cook full meal for he and his wife - includes food prep - peeling, chopping, as well as cooking, and clean up.   ? Time 8   ? Period Weeks   ? Status Achieved reports higher level cooking and prepping with no difficulty at home 08/11/21  ?OT LONG TERM GOAL #7 ?Title  #7 Patient will drink from a hot full mug of coffee or tea using his dominant left hand  ?Time 8   ?Period Weeks   ?Status Ongoing 10/01/21  ?OT LONG TERM GOAL #8 ?Title #8 Patient will demonstrate ability to carry a plate of food and cold drink across a crowded room using BUE without spilling.  ?Time 8   ?Period Weeks   ?Status Ongoing 10/01/21  ?  ?  ? ?  ? ? ? Plan -    ? ? Clinical Impression Statement Patient pleased with progress and eager for even more natural use of LUE/LLE.    ? OT Occupational Profile and History Detailed Assessment- Review of Records and additional review of physical, cognitive, psychosocial history related to current functional performance   ? Occupational performance  deficits (Please refer to evaluation for  details): ADL's;IADL's;Leisure   ? Body Structure / Function / Physical Skills ADL;Decreased knowledge of use of DME;Balance;Dexterity;GMC;Tone;Body mechanics;UE functional use;IADL;Coordination;Flexibility;Mobility;FMC   ? Rehab Potential Excellent   ? Clinical Decision Making Limited treatment options, no task modification necessary   ? Comorbidities Affecting Occupational Performance: May have comorbidities impacting occupational performance   ? Modification or Assistance to Complete Evaluation  No modification of tasks or assist necessary to complete eval   ? OT Frequency 2x / week   ? OT Duration 8 weeks   ? OT Treatment/Interventions Self-care/ADL training;Balance training;Aquatic Therapy;Therapeutic activities;Therapeutic exercise;Neuromuscular education;Functional Mobility Training;Patient/family education;DME and/or AE instruction   ? Plan review HEP for proximal strengthening, continue to work towards new goals and revised goals  ? Consulted and Agree with Plan of Care Patient   ? ?  ?  ? ?  ? ? ? ?Zachery Conch, OT ?08/25/2021, 11:52 AM ? ?  ? ? ? ?

## 2021-08-29 ENCOUNTER — Encounter: Payer: Self-pay | Admitting: Occupational Therapy

## 2021-08-29 ENCOUNTER — Ambulatory Visit: Payer: Medicare Other | Admitting: Physical Therapy

## 2021-08-29 ENCOUNTER — Ambulatory Visit: Payer: Medicare Other | Admitting: Occupational Therapy

## 2021-08-29 ENCOUNTER — Encounter: Payer: Self-pay | Admitting: Physical Therapy

## 2021-08-29 DIAGNOSIS — R278 Other lack of coordination: Secondary | ICD-10-CM

## 2021-08-29 DIAGNOSIS — R2689 Other abnormalities of gait and mobility: Secondary | ICD-10-CM | POA: Diagnosis not present

## 2021-08-29 DIAGNOSIS — R2681 Unsteadiness on feet: Secondary | ICD-10-CM

## 2021-08-29 DIAGNOSIS — M6281 Muscle weakness (generalized): Secondary | ICD-10-CM

## 2021-08-29 DIAGNOSIS — R270 Ataxia, unspecified: Secondary | ICD-10-CM

## 2021-08-29 DIAGNOSIS — I69352 Hemiplegia and hemiparesis following cerebral infarction affecting left dominant side: Secondary | ICD-10-CM

## 2021-08-29 NOTE — Therapy (Signed)
?OUTPATIENT PHYSICAL THERAPY TREATMENT NOTE ? ? ?Patient Name: Eddie Calhoun ?MRN: [id number] ?DOB:03-29-1951, 71 y.o., male ?Today's Date: 08/30/2021 ? ?PCP: Josetta Huddle, MD ?REFERRING PROVIDER: Josetta Huddle, MD ?PT progress note for Ahmed Prima. ? ?Reporting period 07/18/2021 to 08/29/2021 ? ?See Note below for Objective Data and Assessment of Progress/Goals ? ?Thank you for the referral of this patient. ?Elease Etienne, PT, DPT ? ? ? PT End of Session - 08/29/21 1111   ? ? Visit Number 10   ? Number of Visits 13   12+eval  ? Date for PT Re-Evaluation 09/15/21   ? Authorization Type Medicare & Aarp Supplement  Covered 100%  VL: Follows Medicare guidelines-10th visit PN   ? Progress Note Due on Visit 10   ? PT Start Time 1105   ? PT Stop Time 1145   ? PT Time Calculation (min) 40 min   ? Equipment Utilized During Treatment Other (comment);Gait belt   L AFO  ? Activity Tolerance Patient tolerated treatment well   ? Behavior During Therapy [facility] for tasks assessed/performed   ? ?  ?  ? ?  ? ? ? ? ?Past Medical History:  ?Diagnosis Date  ? Allergy   ? allergic rhinitis  ? BPH (benign prostatic hyperplasia)   ? Elevated prostate specific antigen (PSA)   ? Hypertension   ? Plantar fasciitis of right foot   ? Pneumonia   ? Thrombocytopenia, unspecified ([place]) 07/26/2012  ? 03/24/12  129,000!  ? ?History reviewed. No pertinent surgical history. ?Patient Active Problem List  ? Diagnosis Date Noted  ? Left foot drop 08/23/2021  ? 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 Acute ischemic stroke ([place]) 07/01/2021  ? Thrombocytopenia, unspecified ([place]) 07/26/2012  ? ? ?REFERRING DIAG: I63.9 (ICD-10-CM) - CVA (cerebral vascular accident) ([place])  ? ?THERAPY DIAG:  ?Unsteadiness on feet ? ?Muscle weakness (generalized) ? ?Other lack of coordination ? ?Other abnormalities of gait and mobility ? ?PERTINENT HISTORY: Admitted to ED 07/01/2021 for R CVA w/ Left hemiparesis.  He had carpal tunnel surgery in early February 2023.  Discharged  from inpatient rehab 07/17/2021.  ?PMH:  chronic thrombocytopenia, BPH, hyperlipidemia, HN, AKI ? ?PRECAUTIONS: Fall ? ?SUBJECTIVE: He has been very active at gym, doing HEP, bowled and game is improving.  Has not been able to golf yet.  Wearing foot-up brace instead of AFO.  He states he has been monitoring his BP at home w/ it ranging around 115/70s and he has changed his diet and has lost some weight since his stroke. ? ?PAIN:  ?Are you having pain? No ? ? ?OBJECTIVE:  ?  ?DIAGNOSTIC FINDINGS: MRI 07/02/2021 showed Acute perforator infarct at the right basal ganglia and corona radiata. ?  ?TODAY'S TREATMENT: ?Leg press w/ BLE 1x10 of 90 #>2x10 of 110# then LLE only 1x10 45#> 2x10 55# ?Treadmill training on forward incline 2% progressed to 1.96mh x662m w/o AFO, foot-up brace donned instead.  Completed 2 mins backwards ambulating on 1% incline at 0.61m97m  Pt attempts intermittent dec UE support when forward walking, requires BUE support with retrostepping. ?In // bars:  pt stands on firm side of BOSU performing squats x10 using mirror to maintain appropriate knee spacing> squats w/ 10# kettlebell ? ? ? ?  ?PATIENT EDUCATION: ?Education details: Initiated conversation about discharge in coming visits. ?Person educated: Patient ?Education method: Explanation  ?Education comprehension: verbalized understanding ?  ?  ?HOME EXERCISE PROGRAM: ?Access Code: [email or web address]: [email or web address] ?Date: 08/03/2021 ?Prepared by: JanMickie Bailaster ? ?  Exercises ?- Gastroc Stretch on Wall  - 1 x daily - 7 x weekly - 2 sets - 2 reps - 30 sec hold ?- Seated Ankle Dorsiflexion with Anchored Resistance  - 1 x daily - 7 x weekly - 3 sets - 10 reps ?- Eccentric Heel Lowering on Step  - 1 x daily - 7 x weekly - 3 sets - 10 reps ?- Forward Step Down  - 1 x daily - 7 x weekly - 3 sets - 10 reps ?- Squat Jumps  - 1 x daily - 7 x weekly - 3 sets - 10 reps ?- Kettlebell Deadlift  - 1 x daily - 7 x weekly - 3 sets - 10 reps-using  just LUE ?  ?ASSESSMENT: ?  ?CLINICAL IMPRESSION: ?Continued treadmill training this session focusing on increasing speed and decreasing UE support w/ foot-up brace.  Progressed weight training with leg press in bilateral and unilateral positions with patient tolerating 55 pounds unilaterally and 110 pounds bilaterally.  Progressed to high level balance squatting on firm side of BOSU adding kettlebell for additional challenge.  Continue per POC.  ?  ?  ?OBJECTIVE IMPAIRMENTS Abnormal gait, decreased balance, decreased coordination, decreased knowledge of use of DME, difficulty walking, decreased ROM, decreased strength, hypomobility, and impaired flexibility.  ?  ?ACTIVITY LIMITATIONS cleaning, community activity, driving, laundry, yard work, and hobbies: golfing and bowling .  ?  ?PERSONAL FACTORS Age, Past/current experiences, and 3+ comorbidities:  chronic thrombocytopenia, BPH, hyperlipidemia, HN, AKI  are also affecting patient's functional outcome.  ?  ?  ?REHAB POTENTIAL: Excellent ?  ?CLINICAL DECISION MAKING: Stable/uncomplicated ?  ?EVALUATION COMPLEXITY: Low ?  ?  ?GOALS: ?Goals reviewed with patient? Yes ?  ?SHORT TERM GOALS: Target date:  08/18/2021 ?  ?Pt will be maintain initial strength and balance HEP with supervision from family. ?Baseline: Pt is performing HEP + SNAP fitness 08/18/2021 ?Goal status: MET ?  ?2.  Pt will decrease 5xSTS to <13 seconds in order to demonstrate decreased risk for falls and improved functional bilateral LE strength and power.  ?Baseline: 10.78sec w/o UE (UE crossed across chest) from standard chair height ?Goal status: MET ?  ?3.  Pt will demonstrate improved LLE coordination and appropriate speed for safety with gait using LRAD.  ?Baseline: L AFO donned w/ improved L ataxia, pt able to maintain appropriate speed ?Goal status: MET ?  ?4.  Pt will demonstrate TUG of <9 seconds in order to decrease risk of falls and improve functional mobility using LRAD. ?Baseline: 8.25sec  no AD 08/18/2021 w/ less LLE ataxia and improved control with speed w/o LOB ?Goal status: MET ?  ?5.  FGA will be assessed and goal set on visit 2. ?           Baseline:  Assessed 07/27/2021 ?           Goal status:  MET ?  ?LONG TERM GOALS: Target date:  09/15/2021 ?  ?Pt will be independent with finalized strength and balance HEP. ?Baseline: Not initiated on eval ?Goal status: INITIAL ?  ?2.  Pt will ambulate>500 feet on 2MWT to demonstrate improved endurance for functional tasks in home and community.  ?Baseline: 59' w/ SPC w/ quad tip ?Goal status: INITIAL ?  ?3.  Pt will ambulate at least 1000 feet with LRAD at independent level of assist with safe speed and improved LLE placement to promote household and community access. ?Baseline: ataxia 385', scissoring of the LLE using SPC w/ quad  tip;  ?Goal status: Revised 08/18/2021 ?  ?4.  Pt will improve FGA score to 25/30 in order to demonstrate improved balance and decreased fall risk. ?Baseline: 20/30 07/27/2021 ?Goal status: INITIAL ?  ?5.  Pt will improve 10MWT to >/=61msec or 3.3 ft/sec to promote safe access to community. ?Baseline: 2.78 ft/sec OR 0.84 m/sec ?Goal status: INITIAL ?  ?PLAN: ?PT FREQUENCY:  2x/wk for first 4 weeks followed by 1x/wk last 4 weeks ?  ?PT DURATION: 8 weeks ?  ?PLANNED INTERVENTIONS: Therapeutic exercises, Therapeutic activity, Neuromuscular re-education, Balance training, Gait training, Patient/Family education, Joint mobilization, Stair training, Vestibular training, Orthotic/Fit training, DME instructions, and Manual therapy ?  ?PLAN FOR NEXT SESSION: Ambulate up grass hill to simulate golf environment.  SLS on airex>rebounder.  Continue use of leg press.  Continue retro/fwd/sideways gait training on TM- work on increasing speed and incline forward. Try squats on large tilt board with weighted ball and tandem weight shifting on tilt board. Incorporate bowling activity-bowling ball weighs 15#, weighted rebounders, wall ball-progress to  deeper squat 4kg ball, dual task, bilateral arm use to increased LUE proprioception, billiards simulation/forward bend with BUE use; SciFit for aerobic health; gait training w/ cane w/ or w/o AFO, golf

## 2021-08-29 NOTE — Therapy (Signed)
?OUTPATIENT OCCUPATIONAL THERAPY TREATMENT NOTE ? ? ?Patient Name: Eddie Calhoun ?MRN: [id number] ?DOB:1951/04/13, 71 y.o., male ?Today's Date: 08/29/2021 ? ?PCP: Josetta Huddle, MD ?REFERRING PROVIDER: Josetta Huddle, MD ? ? OT End of Session - 08/29/21 1150   ? ? Visit Number 12   ? Number of Visits 17   ? Date for OT Re-Evaluation 10/01/21   ? Authorization Type Medicare and AARP supplement   ? Progress Note Due on Visit 18   ? OT Start Time 1148   ? ?  ?  ? ?  ? ? ? ?Past Medical History:  ?Diagnosis Date  ? Allergy   ? allergic rhinitis  ? BPH (benign prostatic hyperplasia)   ? Elevated prostate specific antigen (PSA)   ? Hypertension   ? Plantar fasciitis of right foot   ? Pneumonia   ? Thrombocytopenia, unspecified ([place]) 07/26/2012  ? 03/24/12  129,000!  ? ?History reviewed. No pertinent surgical history. ?Patient Active Problem List  ? Diagnosis Date Noted  ? Left foot drop 08/23/2021  ? 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 Acute ischemic stroke ([place]) 07/01/2021  ? Thrombocytopenia, unspecified ([place]) 07/26/2012  ? ? ?ONSET DATE: 07/01/21 ? ?REFERRING DIAG: corona radiata, right basal ganglia  ? ?THERAPY DIAG:  ?Hemiplegia and hemiparesis following cerebral infarction affecting left dominant side ([place]) ? ?Other lack of coordination ? ?Muscle weakness (generalized) ? ?Unsteadiness on feet ? ?Ataxia ? ? ?PERTINENT HISTORY: HTN ? ?PRECAUTIONS: Fall Risk ? ?SUBJECTIVE: I am tryin gto use my hand to carry a coffee mug, or drink from a mug.  Sometimes I still brace my right hand underneath.   ? ?PAIN:  ?Are you having pain? No  ? ? ? ? ?OBJECTIVE:  ? ?TODAY'S TREATMENT: ?08/29/21 ?Patient showing steady improvement in dominant LUE.  He is continuin gwith leisure interests requiring BUE - e.g. bowling.  He reports improved gross motor coordination.   ?Worked on distal LUE while seated at table.  Used forearm gym to look at smoothing transitions between movements - e.g. supination / pronation, or combinations of  movements - supination / wrist extension, etc.  Worked on task requiring steady use of LUE - Jenga blocks - stacking and unstacking.   ?Continue to work to increase stability of left hip - 4 point to side sit - slow controlled descent/ascent.  Tall kneeling to half kneeling.  Patient continues to work to improve LLE strength.   ? ? ?08/25/21  ?Pt reports continued difficulty with holding coffee mug and spilling - pt reports increased ease with hand to mouth pattern with tucking elbow and shoulder stability.  ? ?Pt reports going to the gym yesterday doing LB leg press, crunches and bicep curls, and did the rowing machine.  ? ?Equalizer to simulate exercises doing at the gym with similar machine with rows, chest press, bent over row and lat pull downs. Encouraged patient to avoid doing any resistance over 15-20 lbs and signs to look for compensations and improper positioning. Pt verbalized understanding and demonstrated understanding of posture and positioning.  ? ?Dual Task with toss ball and ambulating around clinic with good attention to task and no LOB.  ? ?Pt reports doing a little more weeding and working in the garden but has not been doing much. Reports doing about 1/2 a lawn and then have to stop. LTG #5. ? ?  ? ?HOME EXERCISE PROGRAM ? ?08/18/21: [employer] Access Code: [email or web address] ? ? ? OT Short Term Goals - Met all STGs  08/03/21  ? ?  ? OT SHORT TERM GOAL #1  ? Title Patient will complete an HEP designed to improve coordination in LUE   ? Time 4   ? Period Weeks   ? Status Achieved  ? Target Date 09/01/21   ?  ? OT SHORT TERM GOAL #2  ? Title Patient will demonstrate ability to heat and serve simple warm meal for he and his wife with intermittent support   ? Time 4   ? Period Weeks   ? Status Achieved   reported doing higher cooking last night 07/27/21, continues to report cooking independently 08/03/21  ?  ? OT SHORT TERM GOAL #3  ? Title Patient will feed himself with appropriate use of utensils in 30%  less time without increased spills/drops   ? Time 4   ? Period Weeks   ? Status Achieved   pt reports using chopsticks and the right utensils  ?  ? OT SHORT TERM GOAL #4  ? Title Patient will report improved effectiveness and efficiency with brushing his teeth using dominant LUE   ? Time 4   ? Period Weeks   ? Status Achieved   pt reports doing this consistently 07/27/21, 08/03/21  ? ?  ?  ? ?  ? ? ? OT Long Term Goals - check by 10/01/21  ? ?  ? OT LONG TERM GOAL #1  ? Title Patient will complete Home Activity Program designed to improve his ability to use LUE as dominant side again.   ? Time 8   ? Period Weeks   ? Status Ongoing  ? Target Date 10/01/21   ?  ? OT LONG TERM GOAL #2  ? Title Patient will play a round of golf (9 holes - par 3) and use prior right hand swing.    ? Time 8   ? Period Weeks   ? Status ONGOING  ?  ? OT LONG TERM GOAL #3  ? Title Patient will be able to fully swing a golf club at 75% power  ? Time 8   ? Period Weeks   ? Status ONGOING approx 60% of power  ?  ? OT LONG TERM GOAL #4  ? Title Patient will demonstrate awareness of return to driving recommendations   ? Time 8   ? Period Weeks   ? Status Achieved pt cleared by MD and is now driving  ?  ? OT LONG TERM GOAL #5  ? Title Patient will return to gardening - weeding, raking, hoeing with intermittent assistance   ? Time 8   ? Period Weeks   ? Status Ongoing pt reports mowing part of lawn with no difficulty  ?  ? Long Term Additional Goals  ? Additional Long Term Goals Yes   ?  ? OT LONG TERM GOAL #6  ? Title Patient will prepare and cook full meal for he and his wife - includes food prep - peeling, chopping, as well as cooking, and clean up.   ? Time 8   ? Period Weeks   ? Status Achieved reports higher level cooking and prepping with no difficulty at home 08/11/21  ?OT LONG TERM GOAL #7 ?Title  #7 Patient will drink from a hot full mug of coffee or tea using his dominant left hand  ?Time 8   ?Period Weeks   ?Status Ongoing 10/01/21  ?OT LONG  TERM GOAL #8 ?Title #8 Patient will demonstrate ability to carry a plate  of food and cold drink across a crowded room using BUE without spilling.  ?Time 8   ?Period Weeks   ?Status Ongoing 10/01/21  ?  ?  ? ?  ? ? ? Plan -    ? ? Clinical Impression Statement Patient pleased with progress and eager for even more natural use of LUE/LLE.    ? OT Occupational Profile and History Detailed Assessment- Review of Records and additional review of physical, cognitive, psychosocial history related to current functional performance   ? Occupational performance deficits (Please refer to evaluation for details): ADL's;IADL's;Leisure   ? Body Structure / Function / Physical Skills ADL;Decreased knowledge of use of DME;Balance;Dexterity;GMC;Tone;Body mechanics;UE functional use;IADL;Coordination;Flexibility;Mobility;FMC   ? Rehab Potential Excellent   ? Clinical Decision Making Limited treatment options, no task modification necessary   ? Comorbidities Affecting Occupational Performance: May have comorbidities impacting occupational performance   ? Modification or Assistance to Complete Evaluation  No modification of tasks or assist necessary to complete eval   ? OT Frequency 2x / week   ? OT Duration 8 weeks   ? OT Treatment/Interventions Self-care/ADL training;Balance training;Aquatic Therapy;Therapeutic activities;Therapeutic exercise;Neuromuscular education;Functional Mobility Training;Patient/family education;DME and/or AE instruction   ? Plan review HEP for proximal strengthening, continue to work towards new goals and revised goals  ? Consulted and Agree with Plan of Care Patient   ? ?  ?  ? ?  ? ? ? ?Mariah Milling, OT ?08/29/2021, 1:48 PM ? ?  ? ? ? ?

## 2021-09-01 ENCOUNTER — Encounter: Payer: Self-pay | Admitting: Occupational Therapy

## 2021-09-01 ENCOUNTER — Ambulatory Visit: Payer: Medicare Other | Admitting: Occupational Therapy

## 2021-09-01 DIAGNOSIS — R2689 Other abnormalities of gait and mobility: Secondary | ICD-10-CM

## 2021-09-01 DIAGNOSIS — R278 Other lack of coordination: Secondary | ICD-10-CM

## 2021-09-01 DIAGNOSIS — I69352 Hemiplegia and hemiparesis following cerebral infarction affecting left dominant side: Secondary | ICD-10-CM

## 2021-09-01 DIAGNOSIS — M6281 Muscle weakness (generalized): Secondary | ICD-10-CM

## 2021-09-01 DIAGNOSIS — R2681 Unsteadiness on feet: Secondary | ICD-10-CM

## 2021-09-01 NOTE — Therapy (Signed)
?OUTPATIENT OCCUPATIONAL THERAPY TREATMENT NOTE ? ? ?Patient Name: Eddie Calhoun ?MRN: [id number] ?DOB:1951-03-10, 71 y.o., male ?Today's Date: 09/01/2021 ? ?PCP: Josetta Huddle, MD ?REFERRING PROVIDER: Josetta Huddle, MD ? ? OT End of Session - 09/01/21 1052   ? ? Visit Number 13   ? Number of Visits 17   ? Date for OT Re-Evaluation 10/01/21   ? Authorization Type Medicare and AARP supplement   ? Progress Note Due on Visit 18   ? OT Start Time 1053   ? OT Stop Time 1135   ? OT Time Calculation (min) 42 min   ? Activity Tolerance Patient tolerated treatment well   ? Behavior During Therapy [facility] for tasks assessed/performed   ? ?  ?  ? ?  ? ? ? ?Past Medical History:  ?Diagnosis Date  ? Allergy   ? allergic rhinitis  ? BPH (benign prostatic hyperplasia)   ? Elevated prostate specific antigen (PSA)   ? Hypertension   ? Plantar fasciitis of right foot   ? Pneumonia   ? Thrombocytopenia, unspecified ([place]) 07/26/2012  ? 03/24/12  129,000!  ? ?History reviewed. No pertinent surgical history. ?Patient Active Problem List  ? Diagnosis Date Noted  ? Left foot drop 08/23/2021  ? Lacunar infarction () 07/04/2021  ? Acute ischemic stroke ([place]) 07/01/2021  ? Thrombocytopenia, unspecified ([place]) 07/26/2012  ? ? ?ONSET DATE: 07/01/21 ? ?REFERRING DIAG: corona radiata, right basal ganglia  ? ?THERAPY DIAG:  ?Hemiplegia and hemiparesis following cerebral infarction affecting left dominant side ([place]) ? ?Muscle weakness (generalized) ? ?Other lack of coordination ? ?Unsteadiness on feet ? ?Other abnormalities of gait and mobility ? ? ?PERTINENT HISTORY: HTN ? ?PRECAUTIONS: Fall Risk ? ?SUBJECTIVE: "we have some friends coming over for dinner tonight from [place] - I am making salmon, asparagus and roasted potatoes" ? ?PAIN:  ?Are you having pain? No  ? ? ? ? ?OBJECTIVE:  ? ?TODAY'S TREATMENT: ?09/01/21 ?Pt reports a little tightness in his LLE d/t being out bowling and shopping yesterday.  ? ?Hand Gripper: with LUE on level 4 with  black [place]. Pt picked up 1 inch blocks with gripper with mod drops and mod difficulty.  ? ?O'Connor Pegs with use of tweezers with LUE with mod difficulty and drops. Pt with good sustained pinch. Pt with working on shoulder stability keeping elbow tucked in. Removed with in hand manipulation with min difficulty.  ? ? Arm Bike: for conditioning and reciprocal movements on Level 4 for 8 minutes (forward and backward)  ? ?HOME EXERCISE PROGRAM ? ?08/18/21: [employer] Access Code: [email or web address] ? ? ? OT Short Term Goals - Met all STGs 08/03/21  ? ?  ? OT SHORT TERM GOAL #1  ? Title Patient will complete an HEP designed to improve coordination in LUE   ? Time 4   ? Period Weeks   ? Status Achieved  ? Target Date 09/01/21   ?  ? OT SHORT TERM GOAL #2  ? Title Patient will demonstrate ability to heat and serve simple warm meal for he and his wife with intermittent support   ? Time 4   ? Period Weeks   ? Status Achieved   reported doing higher cooking last night 07/27/21, continues to report cooking independently 08/03/21  ?  ? OT SHORT TERM GOAL #3  ? Title Patient will feed himself with appropriate use of utensils in 30% less time without increased spills/drops   ? Time 4   ? Period Weeks   ?  Status Achieved   pt reports using chopsticks and the right utensils  ?  ? OT SHORT TERM GOAL #4  ? Title Patient will report improved effectiveness and efficiency with brushing his teeth using dominant LUE   ? Time 4   ? Period Weeks   ? Status Achieved   pt reports doing this consistently 07/27/21, 08/03/21  ? ?  ?  ? ?  ? ? ? OT Long Term Goals - check by 10/01/21  ? ?  ? OT LONG TERM GOAL #1  ? Title Patient will complete Home Activity Program designed to improve his ability to use LUE as dominant side again.   ? Time 8   ? Period Weeks   ? Status Ongoing  ? Target Date 10/01/21   ?  ? OT LONG TERM GOAL #2  ? Title Patient will play a round of golf (9 holes - par 3) and use prior right hand swing.    ? Time 8   ? Period Weeks   ?  Status ONGOING  ?  ? OT LONG TERM GOAL #3  ? Title Patient will be able to fully swing a golf club at 75% power  ? Time 8   ? Period Weeks   ? Status ONGOING approx 60% of power  ?  ? OT LONG TERM GOAL #4  ? Title Patient will demonstrate awareness of return to driving recommendations   ? Time 8   ? Period Weeks   ? Status Achieved pt cleared by MD and is now driving  ?  ? OT LONG TERM GOAL #5  ? Title Patient will return to gardening - weeding, raking, hoeing with intermittent assistance   ? Time 8   ? Period Weeks   ? Status Ongoing pt reports mowing part of lawn with no difficulty  ?  ? Long Term Additional Goals  ? Additional Long Term Goals Yes   ?  ? OT LONG TERM GOAL #6  ? Title Patient will prepare and cook full meal for he and his wife - includes food prep - peeling, chopping, as well as cooking, and clean up.   ? Time 8   ? Period Weeks   ? Status Achieved reports higher level cooking and prepping with no difficulty at home 08/11/21  ?OT LONG TERM GOAL #7 ?Title  #7 Patient will drink from a hot full mug of coffee or tea using his dominant left hand  ?Time 8   ?Period Weeks   ?Status Ongoing 10/01/21  ?OT LONG TERM GOAL #8 ?Title #8 Patient will demonstrate ability to carry a plate of food and cold drink across a crowded room using BUE without spilling.  ?Time 8   ?Period Weeks   ?Status Ongoing 10/01/21  ?  ?  ? ?  ? ? ? Plan -    ? ? Clinical Impression Statement Patient pleased with progress and eager for even more natural use of LUE/LLE.    ? OT Occupational Profile and History Detailed Assessment- Review of Records and additional review of physical, cognitive, psychosocial history related to current functional performance   ? Occupational performance deficits (Please refer to evaluation for details): ADL's;IADL's;Leisure   ? Body Structure / Function / Physical Skills ADL;Decreased knowledge of use of DME;Balance;Dexterity;GMC;Tone;Body mechanics;UE functional  use;IADL;Coordination;Flexibility;Mobility;FMC   ? Rehab Potential Excellent   ? Clinical Decision Making Limited treatment options, no task modification necessary   ? Comorbidities Affecting Occupational Performance: May have comorbidities impacting  occupational performance   ? Modification or Assistance to Complete Evaluation  No modification of tasks or assist necessary to complete eval   ? OT Frequency 2x / week   ? OT Duration 8 weeks   ? OT Treatment/Interventions Self-care/ADL training;Balance training;Aquatic Therapy;Therapeutic activities;Therapeutic exercise;Neuromuscular education;Functional Mobility Training;Patient/family education;DME and/or AE instruction   ? Plan review HEP for proximal strengthening, continue to work towards new goals and revised goals  ? Consulted and Agree with Plan of Care Patient   ? ?  ?  ? ?  ? ? ? ?Zachery Conch, OT ?09/01/2021, 11:35 AM ? ?  ? ? ? ?

## 2021-09-05 ENCOUNTER — Encounter: Payer: Self-pay | Admitting: Occupational Therapy

## 2021-09-05 ENCOUNTER — Encounter: Payer: Self-pay | Admitting: Physical Therapy

## 2021-09-05 ENCOUNTER — Ambulatory Visit: Payer: Medicare Other | Attending: Physical Medicine and Rehabilitation | Admitting: Physical Therapy

## 2021-09-05 ENCOUNTER — Ambulatory Visit (INDEPENDENT_AMBULATORY_CARE_PROVIDER_SITE_OTHER): Payer: Medicare Other | Admitting: Neurology

## 2021-09-05 ENCOUNTER — Ambulatory Visit: Payer: Medicare Other | Admitting: Occupational Therapy

## 2021-09-05 ENCOUNTER — Encounter: Payer: Self-pay | Admitting: Neurology

## 2021-09-05 VITALS — BP 120/73 | HR 61 | Ht 70.0 in | Wt 185.0 lb

## 2021-09-05 DIAGNOSIS — R2681 Unsteadiness on feet: Secondary | ICD-10-CM

## 2021-09-05 DIAGNOSIS — M6281 Muscle weakness (generalized): Secondary | ICD-10-CM | POA: Diagnosis present

## 2021-09-05 DIAGNOSIS — I69352 Hemiplegia and hemiparesis following cerebral infarction affecting left dominant side: Secondary | ICD-10-CM | POA: Diagnosis present

## 2021-09-05 DIAGNOSIS — R270 Ataxia, unspecified: Secondary | ICD-10-CM | POA: Diagnosis present

## 2021-09-05 DIAGNOSIS — R2689 Other abnormalities of gait and mobility: Secondary | ICD-10-CM | POA: Diagnosis present

## 2021-09-05 DIAGNOSIS — R278 Other lack of coordination: Secondary | ICD-10-CM | POA: Diagnosis present

## 2021-09-05 DIAGNOSIS — I6381 Other cerebral infarction due to occlusion or stenosis of small artery: Secondary | ICD-10-CM | POA: Diagnosis not present

## 2021-09-05 DIAGNOSIS — I639 Cerebral infarction, unspecified: Secondary | ICD-10-CM | POA: Diagnosis not present

## 2021-09-05 DIAGNOSIS — I69354 Hemiplegia and hemiparesis following cerebral infarction affecting left non-dominant side: Secondary | ICD-10-CM

## 2021-09-05 NOTE — Therapy (Addendum)
?OUTPATIENT PHYSICAL THERAPY TREATMENT NOTE ? ? ?Patient Name: Eddie Calhoun ?MRN: [id number] ?DOB:[birth year], 71 y.o., male ?Today's Date: 09/05/2021 ? ?PCP: Josetta Huddle, MD ?REFERRING PROVIDER: Courtney Heys, MD ? ? ? PT End of Session - 09/05/21 1150   ? ? Visit Number 11   ? Number of Visits 13   12+eval  ? Date for PT Re-Evaluation 09/15/21   ? Authorization Type Medicare & Aarp Supplement  Covered 100%  VL: Follows Medicare guidelines-10th visit PN   ? Progress Note Due on Visit 10   ? PT Start Time 3291   received from OT  ? PT Stop Time 1233   ? PT Time Calculation (min) 44 min   ? Equipment Utilized During Treatment Other (comment);Gait belt   L AFO  ? Activity Tolerance Patient tolerated treatment well   ? Behavior During Therapy [facility] for tasks assessed/performed   ? ?  ?  ? ?  ? ? ? ? ?Past Medical History:  ?Diagnosis Date  ? Allergy   ? allergic rhinitis  ? BPH (benign prostatic hyperplasia)   ? Elevated prostate specific antigen (PSA)   ? Hypertension   ? Plantar fasciitis of right foot   ? Pneumonia   ? Thrombocytopenia, unspecified ([place]) 07/26/2012  ? 03/24/12  129,000!  ? ?History reviewed. No pertinent surgical history. ?Patient Active Problem List  ? Diagnosis Date Noted  ? Left foot drop 08/23/2021  ? 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 Acute ischemic stroke ([place]) 07/01/2021  ? Thrombocytopenia, unspecified ([place]) 07/26/2012  ? ? ?REFERRING DIAG: I63.9 (ICD-10-CM) - CVA (cerebral vascular accident) ([place])  ? ?THERAPY DIAG:  ?Hemiplegia and hemiparesis following cerebral infarction affecting left dominant side ([place]) ? ?Muscle weakness (generalized) ? ?Other lack of coordination ? ?Unsteadiness on feet ? ?PERTINENT HISTORY: Admitted to ED 07/01/2021 for R CVA w/ Left hemiparesis.  He had carpal tunnel surgery in early February 2023.  Discharged from inpatient rehab 07/17/2021.  ?PMH:  chronic thrombocytopenia, BPH, hyperlipidemia, HN, AKI ? ?PRECAUTIONS: Fall ? ?SUBJECTIVE: Pt states he did 0.5  miles on treadmill at 2.20mph and 4% incline at [employer].  Pt initiated follow-up conversation about discharge.  See education section for details. ? ?PAIN:  ?Are you having pain? No ? ? ?OBJECTIVE:  ?  ?DIAGNOSTIC FINDINGS: MRI 07/02/2021 showed Acute perforator infarct at the right basal ganglia and corona radiata. ?  ?TODAY'S TREATMENT: ?See education section. ?Updated HEP to work on knee control and static balance.  See HEP section. ?During all foam balance tasks pt cued to maintain soft knee bend, mild waivering noted w/ ability to prevent genu recurvatum on the LLE.  Difficulty unlocking left knee persists when in L stance. ?  ?PATIENT EDUCATION: ?Education details: Verbally provided resources for purchase of airex-like product per pt request.  Extensive discussion of re-cert process and updated goals based on quality of movement and addressing lingering functional deficits like pivot turns, left glut weakness, and left knee control.  Pt feeling anxious about potential discharge from therapy due to prior conversation with MD about recurrence of stroke and wife's upcoming shoulder replacement surgery.  Discussed maintenance of current lifestyle changes (diet, exercise, etc.) that pt discussed with MD and has been compliant to thus far.  Discussed episodic PT vs constant PT to promote independent maintenance of gains following eventual discharge.   ?Person educated: Patient ?Education method: Explanation  ?Education comprehension: verbalized understanding ?  ?  ?HOME EXERCISE PROGRAM: ?Access Code: [email or web address] ?URL: [email or web address] ?Date: 09/05/2021 ?  Prepared by: Elease Etienne ? ?Exercises ?- Gastroc Stretch on Wall  - 1 x daily - 7 x weekly - 2 sets - 2 reps - 30 sec hold ?- Seated Ankle Dorsiflexion with Anchored Resistance  - 1 x daily - 7 x weekly - 3 sets - 10 reps ?- Eccentric Heel Lowering on Step  - 1 x daily - 7 x weekly - 3 sets - 10 reps ?- Forward Step Down  - 1 x daily - 7 x  weekly - 3 sets - 10 reps ?- Squat Jumps  - 1 x daily - 7 x weekly - 3 sets - 10 reps ?- Kettlebell Deadlift  - 1 x daily - 7 x weekly - 3 sets - 10 reps ?- Single Leg Stance on Foam Pad  - 1 x daily - 5 x weekly - 3 sets - 10 reps -encouraged pt to use toe touch with RLE when LLE in stance for appropriate challenge w/o requiring UE support ?- Standing with Feet Together on Foam Pad   - 1 x daily - 5 x weekly - 3 sets - 10 reps -added eyes closed for inc challenge same frequency ?- Romberg Stance Eyes Closed on Foam Pad  - 1 x daily - 5 x weekly - 3 sets - 10 reps -pt instructed to start with eyes open and use back of chair in front for added 3rd pt for balance as needed.   ?- Tandem Stance on Foam Pad with Eyes Closed  - 1 x daily - 5 x weekly - 3 sets - 10 reps ?*Pt encourage to vary length of hold time to match length of time he can hold position w/o LOB.  Demo'd appropriate corner setup at home for all standing balance tasks.   ? ?ASSESSMENT: ?  ?CLINICAL IMPRESSION: ?Time spent in session addressing pt concerns and discussing plan for re-cert next visit with discussion of updated goals.  Adjusted HEP to further address static balance as well as proprioceptive and vestibular deficits.  Increased emphasis on quality of knee control.  Continue per POC. ?  ?  ?OBJECTIVE IMPAIRMENTS Abnormal gait, decreased balance, decreased coordination, decreased knowledge of use of DME, difficulty walking, decreased ROM, decreased strength, hypomobility, and impaired flexibility.  ?  ?ACTIVITY LIMITATIONS cleaning, community activity, driving, laundry, yard work, and hobbies: golfing and bowling .  ?  ?PERSONAL FACTORS Age, Past/current experiences, and 3+ comorbidities:  chronic thrombocytopenia, BPH, hyperlipidemia, HN, AKI  are also affecting patient's functional outcome.  ?  ?  ?REHAB POTENTIAL: Excellent ?  ?CLINICAL DECISION MAKING: Stable/uncomplicated ?  ?EVALUATION COMPLEXITY: Low ?  ?  ?GOALS: ?Goals reviewed with  patient? Yes ?  ?SHORT TERM GOALS: Target date:  08/18/2021 ?  ?Pt will be maintain initial strength and balance HEP with supervision from family. ?Baseline: Pt is performing HEP + SNAP fitness 08/18/2021 ?Goal status: MET ?  ?2.  Pt will decrease 5xSTS to <13 seconds in order to demonstrate decreased risk for falls and improved functional bilateral LE strength and power.  ?Baseline: 10.78sec w/o UE (UE crossed across chest) from standard chair height ?Goal status: MET ?  ?3.  Pt will demonstrate improved LLE coordination and appropriate speed for safety with gait using LRAD.  ?Baseline: L AFO donned w/ improved L ataxia, pt able to maintain appropriate speed ?Goal status: MET ?  ?4.  Pt will demonstrate TUG of <9 seconds in order to decrease risk of falls and improve functional mobility using LRAD. ?Baseline:  8.25sec no AD 08/18/2021 w/ less LLE ataxia and improved control with speed w/o LOB ?Goal status: MET ?  ?5.  FGA will be assessed and goal set on visit 2. ?           Baseline:  Assessed 07/27/2021 ?           Goal status:  MET ?  ?LONG TERM GOALS: Target date:  09/15/2021 ?  ?Pt will be independent with finalized strength and balance HEP. ?Baseline: Not initiated on eval ?Goal status: INITIAL ?  ?2.  Pt will ambulate>500 feet on 2MWT to demonstrate improved endurance for functional tasks in home and community.  ?Baseline: 36' w/ SPC w/ quad tip ?Goal status: INITIAL ?  ?3.  Pt will ambulate at least 1000 feet with LRAD at independent level of assist with safe speed and improved LLE placement to promote household and community access. ?Baseline: ataxia 385', scissoring of the LLE using SPC w/ quad tip;  ?Goal status: Revised 08/18/2021 ?  ?4.  Pt will improve FGA score to 25/30 in order to demonstrate improved balance and decreased fall risk. ?Baseline: 20/30 07/27/2021 ?Goal status: INITIAL ?  ?5.  Pt will improve 10MWT to >/=26m/sec or 3.3 ft/sec to promote safe access to community. ?Baseline: 2.78 ft/sec OR 0.84  m/sec ?Goal status: INITIAL ?  ?PLAN: ?PT FREQUENCY:  2x/wk for first 4 weeks followed by 1x/wk last 4 weeks ?  ?PT DURATION: 8 weeks ?  ?PLANNED INTERVENTIONS: Therapeutic exercises, Therapeutic activity, Neu

## 2021-09-05 NOTE — Patient Instructions (Signed)
Access Code: [email or web address] ?URL: [email or web address] ?Date: 09/05/2021 ?Prepared by: Elease Etienne ? ?Exercises ?- Gastroc Stretch on Wall  - 1 x daily - 7 x weekly - 2 sets - 2 reps - 30 sec hold ?- Seated Ankle Dorsiflexion with Anchored Resistance  - 1 x daily - 7 x weekly - 3 sets - 10 reps ?- Eccentric Heel Lowering on Step  - 1 x daily - 7 x weekly - 3 sets - 10 reps ?- Forward Step Down  - 1 x daily - 7 x weekly - 3 sets - 10 reps ?- Squat Jumps  - 1 x daily - 7 x weekly - 3 sets - 10 reps ?- Kettlebell Deadlift  - 1 x daily - 7 x weekly - 3 sets - 10 reps ?- Single Leg Stance on Foam Pad  - 1 x daily - 5 x weekly - 3 sets - 10 reps -encouraged pt to use toe touch with RLE when LLE in stance for appropriate challenge w/o requiring UE support ?- Standing with Feet Together on Foam Pad   - 1 x daily - 5 x weekly - 3 sets - 10 reps -added eyes closed for inc challenge same frequency ?- Romberg Stance Eyes Closed on Foam Pad  - 1 x daily - 5 x weekly - 3 sets - 10 reps -pt instructed to start with eyes open and use back of chair in front for added 3rd pt for balance as needed.   ?- Tandem Stance on Foam Pad with Eyes Closed  - 1 x daily - 5 x weekly - 3 sets - 10 reps ?*Pt encourage to vary length of hold time to match length of time he can hold position w/o LOB.  Demo'd appropriate corner setup at home for all standing balance tasks. ?

## 2021-09-05 NOTE — Therapy (Signed)
?OUTPATIENT OCCUPATIONAL THERAPY TREATMENT NOTE ? ? ?Patient Name: Eddie Calhoun ?MRN: [id number] ?DOB:07-16-1950, 71 y.o., male ?Today's Date: 09/05/2021 ? ?PCP: Josetta Huddle, MD ?REFERRING PROVIDER: Courtney Heys, MD ? ? OT End of Session - 09/05/21 1250   ? ? Visit Number 14   ? Number of Visits 25  ? Date for OT Re-Evaluation 11/24/21  ? Authorization Type Medicare and AARP supplement   ? Progress Note Due on Visit 18   ? OT Start Time 1103   ? OT Stop Time 1145   ? OT Time Calculation (min) 42 min   ? Activity Tolerance Patient tolerated treatment well   ? Behavior During Therapy [facility] for tasks assessed/performed   ? ?  ?  ? ?  ? ? ? ?Past Medical History:  ?Diagnosis Date  ? Allergy   ? allergic rhinitis  ? BPH (benign prostatic hyperplasia)   ? Elevated prostate specific antigen (PSA)   ? Hypertension   ? Plantar fasciitis of right foot   ? Pneumonia   ? Thrombocytopenia, unspecified ([place]) 07/26/2012  ? 03/24/12  129,000!  ? ?History reviewed. No pertinent surgical history. ?Patient Active Problem List  ? Diagnosis Date Noted  ? Left foot drop 08/23/2021  ? 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 Acute ischemic stroke ([place]) 07/01/2021  ? Thrombocytopenia, unspecified ([place]) 07/26/2012  ? ? ?ONSET DATE: 07/01/21 ? ?REFERRING DIAG: corona radiata, right basal ganglia  ? ?THERAPY DIAG:  ?Hemiplegia and hemiparesis following cerebral infarction affecting left dominant side ([place]) ? ?Muscle weakness (generalized) ? ?Other lack of coordination ? ?Unsteadiness on feet ? ?Ataxia ? ? ?PERTINENT HISTORY: HTN ? ?PRECAUTIONS: Fall Risk ? ?SUBJECTIVE: I am tryin gto use my hand to carry a coffee mug, or drink from a mug.  Sometimes I still brace my right hand underneath.   ? ?PAIN:  ?Are you having pain? No  ? ? ? ? ?OBJECTIVE:  ? ?TODAY'S TREATMENT: ?09/05/21: ?Patient saw Neurology today prior to OT session.  Patient reports discussing progress and potential for future gains with neuro.  Neuro reinforced the importance of  slowing down with movement and activity.  Continued to work on functional mobility.  Patient with decreased left hip stability/strength.  Worked on sustaining activation in left hip in dynamic standing conditions.  Worked on gross motor coordination walking and spinning ball.  Patient lacks ability to control hip and knee simultaneously - working for coordination of all limbs.   ? ?08/29/21 ?Patient showing steady improvement in dominant LUE.  He is continuin gwith leisure interests requiring BUE - e.g. bowling.  He reports improved gross motor coordination.   ?Worked on distal LUE while seated at table.  Used forearm gym to look at smoothing transitions between movements - e.g. supination / pronation, or combinations of movements - supination / wrist extension, etc.  Worked on task requiring steady use of LUE - Jenga blocks - stacking and unstacking.   ?Continue to work to increase stability of left hip - 4 point to side sit - slow controlled descent/ascent.  Tall kneeling to half kneeling.  Patient continues to work to improve LLE strength.   ? ? ?08/25/21  ?Pt reports continued difficulty with holding coffee mug and spilling - pt reports increased ease with hand to mouth pattern with tucking elbow and shoulder stability.  ? ?Pt reports going to the gym yesterday doing LB leg press, crunches and bicep curls, and did the rowing machine.  ? ?Equalizer to simulate exercises doing at  the gym with similar machine with rows, chest press, bent over row and lat pull downs. Encouraged patient to avoid doing any resistance over 15-20 lbs and signs to look for compensations and improper positioning. Pt verbalized understanding and demonstrated understanding of posture and positioning.  ? ?Dual Task with toss ball and ambulating around clinic with good attention to task and no LOB.  ? ?Pt reports doing a little more weeding and working in the garden but has not been doing much. Reports doing about 1/2 a lawn and then have to  stop. LTG #5. ? ?  ? ?HOME EXERCISE PROGRAM ? ?08/18/21: [employer] Access Code: [email or web address] ? ? ? OT Short Term Goals - Met all STGs 08/03/21  ? ?  ? OT SHORT TERM GOAL #1  ? Title Patient will complete an HEP designed to improve coordination in LUE   ? Time 4   ? Period Weeks   ? Status Achieved  ? Target Date 09/01/21   ?  ? OT SHORT TERM GOAL #2  ? Title Patient will demonstrate ability to heat and serve simple warm meal for he and his wife with intermittent support   ? Time 4   ? Period Weeks   ? Status Achieved   reported doing higher cooking last night 07/27/21, continues to report cooking independently 08/03/21  ?  ? OT SHORT TERM GOAL #3  ? Title Patient will feed himself with appropriate use of utensils in 30% less time without increased spills/drops   ? Time 4   ? Period Weeks   ? Status Achieved   pt reports using chopsticks and the right utensils  ?  ? OT SHORT TERM GOAL #4  ? Title Patient will report improved effectiveness and efficiency with brushing his teeth using dominant LUE   ? Time 4   ? Period Weeks   ? Status Achieved   pt reports doing this consistently 07/27/21, 08/03/21  ? ?  ?  ? ?  ? ? ? OT Long Term Goals - check by 11/24/21  ? ?  ? OT LONG TERM GOAL #1  ? Title Patient will complete Home Activity Program designed to improve his ability to use LUE as dominant side again.   ? Time 8   ? Period Weeks   ? Status Ongoing  ? Target Date 11/24/21  ?  ? OT LONG TERM GOAL #2  ? Title Patient will play a round of golf (9 holes - par 3) and use prior right hand swing.    ? Time 8   ? Period Weeks   ? Status ONGOING  ?  ? OT LONG TERM GOAL #3  ? Title Patient will be able to fully swing a golf club at 75% power  ? Time 8   ? Period Weeks   ? Status ONGOING approx 60% of power  ?  ? OT LONG TERM GOAL #4  ? Title Patient will demonstrate awareness of return to driving recommendations   ? Time 8   ? Period Weeks   ? Status Achieved pt cleared by MD and is now driving  ?  ? OT LONG TERM GOAL #5  ? Title  Patient will return to gardening - weeding, raking, hoeing with intermittent assistance   ? Time 8   ? Period Weeks   ? Status Ongoing pt reports mowing part of lawn with no difficulty  ?  ? Long Term Additional Goals  ? Additional Long Term Goals  Yes   ?  ? OT LONG TERM GOAL #6  ? Title Patient will prepare and cook full meal for he and his wife - includes food prep - peeling, chopping, as well as cooking, and clean up.   ? Time 8   ? Period Weeks   ? Status Achieved reports higher level cooking and prepping with no difficulty at home 08/11/21  ?OT LONG TERM GOAL #7 ?Title  #7 Patient will drink from a hot full mug of coffee or tea using his dominant left hand  ?Time 8   ?Period Weeks   ?Status Ongoing 11/24/21  ?OT LONG TERM GOAL #8 ?Title #8 Patient will demonstrate ability to carry a plate of food and cold drink across a crowded room using BUE without spilling.  ?Time 8   ?Period Weeks   ?Status Ongoing 11/25/19  ?  ?  ? ?  ? ? ? Plan -    ? ? Clinical Impression Statement Plan to extend plan of care to address remaining OT goals.  Patient has made excellent progress, yet has potential for even more functional use of dominant LUE.    ? OT Occupational Profile and History Detailed Assessment- Review of Records and additional review of physical, cognitive, psychosocial history related to current functional performance   ? Occupational performance deficits (Please refer to evaluation for details): ADL's;IADL's;Leisure   ? Body Structure / Function / Physical Skills ADL;Decreased knowledge of use of DME;Balance;Dexterity;GMC;Tone;Body mechanics;UE functional use;IADL;Coordination;Flexibility;Mobility;FMC   ? Rehab Potential Excellent   ? Clinical Decision Making Limited treatment options, no task modification necessary   ? Comorbidities Affecting Occupational Performance: May have comorbidities impacting occupational performance   ? Modification or Assistance to Complete Evaluation  No modification of tasks or assist  necessary to complete eval   ? OT Frequency 2x / week x 2 weeks, then 1x/wk x 8 wks  ? OT Duration 8 weeks   ? OT Treatment/Interventions Self-care/ADL training;Balance training;Aquatic Therapy;Therapeut

## 2021-09-05 NOTE — Progress Notes (Signed)
?[facility] ?[address] ?[place]. [place] [address] ?[phone] ? ?     OFFICE CONSULT NOTE ? ?[name] ?Date of Birth:  07-18-1950 ?Medical Record Number:  [id number]  ? ?Referring MD:  Silvestre Mesi PA-c ? ?Reason for Referral:  Stroke ? ?HPI: Eddie Calhoun is a 71 year old male seen today for initial office consultation visit for stroke.  He is accompanied by his wife.  History is obtained from them and review of electronic medical records and I have personally reviewed pertinent available imaging films in PACS.  He has a past medical history of hypertension, bilateral carpal tunnel surgeries, benign prostatic hypertrophy, seasonal allergies who presented on 07/01/2021 with sudden onset of left-sided heaviness involving face arm and leg and weakness.  He had difficulty raising his left arm and leg.  He called EMS who noticed facial asymmetry and significant left-sided weakness and activated a code stroke.  Noncontrast CT scan of the head was negative for any acute process.  Given recent carpal tunnel surgery there was careful discussion about risk benefits of IV TNK and patient agreed to proceed and this was administered with the door to needle time of only 9 minutes.  Patient did remarkably well and started improving shortly thereafter.  MRI scan of the brain showed a moderate-sized large basal ganglia and corona radiata infarct.  MR angiogram of the brain showed no significant proximal large vessel stenosis but did show some irregularities in the right P3 P4 segments.  Echocardiogram showed normal ejection fraction of 60 to 65% without any clot.  LDL cholesterol was 79 mg percent.  Hemoglobin A1c was 5.5.  MR angiogram of the neck showed no extracranial large vessel stenosis.  Patient started on dual antiplatelet therapy aspirin and Plavix and transferred to inpatient rehab and did really well he is currently at home and walking independently.  He is doing still outpatient  physical and Occupational Therapy.  His left hand strength is improved significantly though fine motor skills are still diminished.  His left foot is still heavy and drags and he has to wear an ankle-foot brace.  His on aspirin tolerating well without bruising or bleeding.  His blood pressure is under good control.  He is tolerating Crestor well without muscle aches or pains.  He has not had any recurrent stroke or TIA symptoms. ? ?ROS:   ?14 system review of systems is positive for slurred speech, weakness all other systems negative ? ?PMH:  ?Past Medical History:  ?Diagnosis Date  ? Allergy   ? allergic rhinitis  ? BPH (benign prostatic hyperplasia)   ? Elevated prostate specific antigen (PSA)   ? Hypertension   ? Plantar fasciitis of right foot   ? Pneumonia   ? Thrombocytopenia, unspecified ([place]) 07/26/2012  ? 03/24/12  129,000!  ? ? ?Social History:  ?Social History  ? ?Socioeconomic History  ? Marital status: Married  ?  Spouse name: Not on file  ? Number of children: Not on file  ? Years of education: Not on file  ? Highest education level: Not on file  ?Occupational History  ? Not on file  ?Tobacco Use  ? Smoking status: Former  ?  Packs/day: 2.00  ?  Years: 20.00  ?  Pack years: 40.00  ?  Types: Cigarettes  ?  Quit date: 09/21/1987  ?  Years since quitting: 33.9  ? Smokeless tobacco: Never  ?Vaping Use  ? Vaping Use: Never used  ?Substance and Sexual Activity  ?  Alcohol use: No  ? Drug use: Never  ? Sexual activity: Not Currently  ?Other Topics Concern  ? Not on file  ?Social History Narrative  ? Not on file  ? ?Social Determinants of Health  ? ?Financial Resource Strain: Not on file  ?Food Insecurity: Not on file  ?Transportation Needs: Not on file  ?Physical Activity: Not on file  ?Stress: Not on file  ?Social Connections: Not on file  ?Intimate Partner Violence: Not on file  ? ? ?Medications:   ?Current Outpatient Medications on File Prior to Visit  ?Medication Sig Dispense Refill  ? acetaminophen  (TYLENOL) 325 MG tablet Take 2 tablets (650 mg total) by mouth every 4 (four) hours as needed for mild pain (or temp > 37.5 C (99.5 F)). 30 tablet 0  ? aspirin EC 81 MG tablet 1 tablet    ? cholecalciferol (VITAMIN D3) 25 MCG (1000 UNIT) tablet Take 1 tablet (1,000 Units total) by mouth daily. 30 tablet 0  ? Multiple Vitamin (MULTIVITAMIN) tablet Take 1 tablet by mouth daily.      ? olmesartan (BENICAR) 20 MG tablet Take 10 mg by mouth daily.    ? rosuvastatin (CRESTOR) 20 MG tablet Take 20 mg by mouth daily.    ? tamsulosin (FLOMAX) 0.4 MG CAPS capsule Take 1 capsule (0.4 mg total) by mouth daily after supper. 30 capsule 0  ? vitamin B-12 (CYANOCOBALAMIN) 1000 MCG tablet Take 1 tablet (1,000 mcg total) by mouth daily. 30 tablet 0  ? ?No current facility-administered medications on file prior to visit.  ? ? ?Allergies:   ?Allergies  ?Allergen Reactions  ? Contrast Media [Iodinated Contrast Media] Hives  ? Dutasteride Other (See Comments)  ? Iodine Other (See Comments)  ? ? ?Physical Exam ?General: well developed, well nourished, seated, in no evident distress ?Head: head normocephalic and atraumatic.   ?Neck: supple with no carotid or supraclavicular bruits ?Cardiovascular: regular rate and rhythm, no murmurs ?Musculoskeletal: no deformity ?Skin:  no rash/petichiae ?Vascular:  Normal pulses all extremities ? ?Neurologic Exam ?Mental Status: Awake and fully alert. Oriented to place and time. Recent and remote memory intact. Attention span, concentration and fund of knowledge appropriate. Mood and affect appropriate.  ?Cranial Nerves: Fundoscopic exam reveals sharp disc margins. Pupils equal, briskly reactive to light. Extraocular movements full without nystagmus. Visual fields full to confrontation. Hearing intact. Facial sensation intact. Face, tongue, palate moves normally and symmetrically.  ?Motor: Normal bulk and tone. Normal strength in all tested extremity muscles.  Except slight diminished fine finger  movements on the left.  Orbits right over left upper extremity.  Mild ankle dorsiflexor weakness on the left.  Tone is increased in the left leg. ?Sensory.: intact to touch , pinprick , position and vibratory sensation.  ?Coordination: Rapid alternating movements normal in all extremities. Finger-to-nose and heel-to-shin performed accurately bilaterally. ?Gait and Station: Arises from chair without difficulty. Stance is normal. Gait demonstrates slight stiffness and dragging of the left leg..  Tandem walking not tested.  Reflexes: 1+ and asymmetric and brisker on the left. Toes downgoing.  ? ?NIHSS  0 ?Modified Rankin  2 ? ? 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  Vascular risk factors of hyperlipidemia and hypertension.  He is doing well except for mild left leg and hand weakness. ? ? ? ? ?PLAN:I had a long d/w patient and his wife about his recent cryptogenic stroke, risk for recurrent stroke/TIAs, personally independently reviewed imaging  studies and stroke evaluation results and answered questions.Continue aspirin 81 mg daily  for secondary stroke prevention and maintain strict control of hypertension with blood pressure goal below 130/90, diabetes with hemoglobin A1c goal below 6.5% and lipids with LDL cholesterol goal below 70 mg/dL. I also advised the patient to eat a healthy diet with plenty of whole grains, cereals, fruits and vegetables, exercise regularly and maintain ideal body weight .continue outpatient physical and occupational therapy and gait training.  Check outpatient loop recorder for paroxysmal A-fib.  Followup in the future with me in 3 months or call earlier if necessary.  Greater than 50% time during this 45-minute consultation visit was spent on counseling and coordination of care about his hemiparesis and cryptogenic stroke and discussion about evaluation and treatment plan and answering questions. ? ?Antony Contras,  MD ?Note: This document was prepared with digital dictation and possible smart phrase technology. Any transcriptional errors that result from this process are unintentional. ? ? ?

## 2021-09-05 NOTE — Patient Instructions (Signed)
I had a long d/w patient about his recent stroke, risk for recurrent stroke/TIAs, personally independently reviewed imaging studies and stroke evaluation results and answered questions.Continue aspirin 81 mg daily  for secondary stroke prevention and maintain strict control of hypertension with blood pressure goal below 130/90, diabetes with hemoglobin A1c goal below 6.5% and lipids with LDL cholesterol goal below 70 mg/dL. I also advised the patient to eat a healthy diet with plenty of whole grains, cereals, fruits and vegetables, exercise regularly and maintain ideal body weight .continue outpatient physical and occupational therapy and gait training.  Check outpatient loop recorder for paroxysmal A-fib.  Followup in the future with me in 3 months or call earlier if necessary. ?Stroke Prevention ?Some medical conditions and behaviors can lead to a higher chance of having a stroke. You can help prevent a stroke by eating healthy, exercising, not smoking, and managing any medical conditions you have. ?Stroke is a leading cause of functional impairment. Primary prevention is particularly important because a majority of strokes are first-time events. Stroke changes the lives of not only those who experience a stroke but also their family and other caregivers. ?How can this condition affect me? ?A stroke is a medical emergency and should be treated right away. A stroke can lead to brain damage and can sometimes be life-threatening. If a person gets medical treatment right away, there is a better chance of surviving and recovering from a stroke. ?What can increase my risk? ?The following medical conditions may increase your risk of a stroke: ?Cardiovascular disease. ?High blood pressure (hypertension). ?Diabetes. ?High cholesterol. ?Sickle cell disease. ?Blood clotting disorders (hypercoagulable state). ?Obesity. ?Sleep disorders (obstructive sleep apnea). ?Other risk factors include: ?Being older than age 67. ?Having a  history of blood clots, stroke, or mini-stroke (transient ischemic attack, TIA). ?Genetic factors, such as race, ethnicity, or a family history of stroke. ?Smoking cigarettes or using other tobacco products. ?Taking birth control pills, especially if you also use tobacco. ?Heavy use of alcohol or drugs, especially cocaine and methamphetamine. ?Physical inactivity. ?What actions can I take to prevent this? ?Manage your health conditions ?High cholesterol levels. ?Eating a healthy diet is important for preventing high cholesterol. If cholesterol cannot be managed through diet alone, you may need to take medicines. ?Take any prescribed medicines to control your cholesterol as told by your health care provider. ?Hypertension. ?To reduce your risk of stroke, try to keep your blood pressure below 130/80. ?Eating a healthy diet and exercising regularly are important for controlling blood pressure. If these steps are not enough to manage your blood pressure, you may need to take medicines. ?Take any prescribed medicines to control hypertension as told by your health care provider. ?Ask your health care provider if you should monitor your blood pressure at home. ?Have your blood pressure checked every year, even if your blood pressure is normal. Blood pressure increases with age and some medical conditions. ?Diabetes. ?Eating a healthy diet and exercising regularly are important parts of managing your blood sugar (glucose). If your blood sugar cannot be managed through diet and exercise, you may need to take medicines. ?Take any prescribed medicines to control your diabetes as told by your health care provider. ?Get evaluated for obstructive sleep apnea. Talk to your health care provider about getting a sleep evaluation if you snore a lot or have excessive sleepiness. ?Make sure that any other medical conditions you have, such as atrial fibrillation or atherosclerosis, are managed. ?Nutrition ?Follow instructions from your  health care  provider about what to eat or drink to help manage your health condition. These instructions may include: ?Reducing your daily calorie intake. ?Limiting how much salt (sodium) you use to 1,500 milligrams (mg) each day. ?Using only healthy fats for cooking, such as olive oil, canola oil, or sunflower oil. ?Eating healthy foods. You can do this by: ?Choosing foods that are high in fiber, such as whole grains, and fresh fruits and vegetables. ?Eating at least 5 servings of fruits and vegetables a day. Try to fill one-half of your plate with fruits and vegetables at each meal. ?Choosing lean protein foods, such as lean cuts of meat, poultry without skin, fish, tofu, beans, and nuts. ?Eating low-fat dairy products. ?Avoiding foods that are high in sodium. This can help lower blood pressure. ?Avoiding foods that have saturated fat, trans fat, and cholesterol. This can help prevent high cholesterol. ?Avoiding processed and prepared foods. ?Counting your daily carbohydrate intake. ? ?Lifestyle ?If you drink alcohol: ?Limit how much you have to: ?0-1 drink a day for women who are not pregnant. ?0-2 drinks a day for men. ?Know how much alcohol is in your drink. In the [place] one drink equals one 12 oz bottle of beer (345m), one 5 oz glass of wine (1477m, or one 1? oz glass of hard liquor (4485m ?Do not use any products that contain nicotine or tobacco. These products include cigarettes, chewing tobacco, and vaping devices, such as e-cigarettes. If you need help quitting, ask your health care provider. ?Avoid secondhand smoke. ?Do not use drugs. ?Activity ? ?Try to stay at a healthy weight. ?Get at least 30 minutes of exercise on most days, such as: ?Fast walking. ?Biking. ?Swimming. ?Medicines ?Take over-the-counter and prescription medicines only as told by your health care provider. Aspirin or blood thinners (antiplatelets or anticoagulants) may be recommended to reduce your risk of forming blood clots that  can lead to stroke. ?Avoid taking birth control pills. Talk to your health care provider about the risks of taking birth control pills if: ?You are over 35 73ars old. ?You smoke. ?You get very bad headaches. ?You have had a blood clot. ?Where to find more information ?American Stroke Association: www.strokeassociation.org ?Get help right away if: ?You or a loved one has any symptoms of a stroke. "BE FAST" is an easy way to remember the main warning signs of a stroke: ?B - Balance. Signs are dizziness, sudden trouble walking, or loss of balance. ?E - Eyes. Signs are trouble seeing or a sudden change in vision. ?F - Face. Signs are sudden weakness or numbness of the face, or the face or eyelid drooping on one side. ?A - Arms. Signs are weakness or numbness in an arm. This happens suddenly and usually on one side of the body. ?S - Speech. Signs are sudden trouble speaking, slurred speech, or trouble understanding what people say. ?T - Time. Time to call emergency services. Write down what time symptoms started. ?You or a loved one has other signs of a stroke, such as: ?A sudden, severe headache with no known cause. ?Nausea or vomiting. ?Seizure. ?These symptoms may represent a serious problem that is an emergency. Do not wait to see if the symptoms will go away. Get medical help right away. Call your local emergency services (911 in the [place] Do not drive yourself to the hospital. ?Summary ?You can help to prevent a stroke by eating healthy, exercising, not smoking, limiting alcohol intake, and managing any medical conditions you may have. ?Do not  use any products that contain nicotine or tobacco. These include cigarettes, chewing tobacco, and vaping devices, such as e-cigarettes. If you need help quitting, ask your health care provider. ?Remember "BE FAST" for warning signs of a stroke. Get help right away if you or a loved one has any of these signs. ?This information is not intended to replace advice given to you  by your health care provider. Make sure you discuss any questions you have with your health care provider. ?Document Revised: 11/23/2019 Document Reviewed: 11/23/2019 ?Elsevier Patient Education ? 2023 E

## 2021-09-08 ENCOUNTER — Encounter: Payer: Self-pay | Admitting: Occupational Therapy

## 2021-09-08 ENCOUNTER — Ambulatory Visit: Payer: Medicare Other | Admitting: Occupational Therapy

## 2021-09-08 DIAGNOSIS — I69352 Hemiplegia and hemiparesis following cerebral infarction affecting left dominant side: Secondary | ICD-10-CM

## 2021-09-08 DIAGNOSIS — R2689 Other abnormalities of gait and mobility: Secondary | ICD-10-CM

## 2021-09-08 DIAGNOSIS — R2681 Unsteadiness on feet: Secondary | ICD-10-CM

## 2021-09-08 DIAGNOSIS — M6281 Muscle weakness (generalized): Secondary | ICD-10-CM

## 2021-09-08 DIAGNOSIS — R278 Other lack of coordination: Secondary | ICD-10-CM

## 2021-09-08 NOTE — Therapy (Signed)
?OUTPATIENT OCCUPATIONAL THERAPY TREATMENT NOTE ? ? ?Patient Name: Eddie Calhoun ?MRN: [id number] ?DOB:1950-11-05, 71 y.o., male ?Today's Date: 09/08/2021 ? ?PCP: Josetta Huddle, MD ?REFERRING PROVIDER: Josetta Huddle, MD ? ? OT End of Session - 09/05/21 1250   ? ? Visit Number 14   ? Number of Visits 25  ? Date for OT Re-Evaluation 11/24/21  ? Authorization Type Medicare and AARP supplement   ? Progress Note Due on Visit 18   ? OT Start Time 1103   ? OT Stop Time 1145   ? OT Time Calculation (min) 42 min   ? Activity Tolerance Patient tolerated treatment well   ? Behavior During Therapy [facility] for tasks assessed/performed   ? ?  ?  ? ?  ? ? ? ?Past Medical History:  ?Diagnosis Date  ? Allergy   ? allergic rhinitis  ? BPH (benign prostatic hyperplasia)   ? Elevated prostate specific antigen (PSA)   ? Hypertension   ? Plantar fasciitis of right foot   ? Pneumonia   ? Thrombocytopenia, unspecified ([place]) 07/26/2012  ? 03/24/12  129,000!  ? ?History reviewed. No pertinent surgical history. ?Patient Active Problem List  ? Diagnosis Date Noted  ? Left foot drop 08/23/2021  ? 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 Acute ischemic stroke ([place]) 07/01/2021  ? Thrombocytopenia, unspecified ([place]) 07/26/2012  ? ? ?ONSET DATE: 07/01/21 ? ?REFERRING DIAG: corona radiata, right basal ganglia  ? ?THERAPY DIAG:  ?No diagnosis found. ? ? ?PERTINENT HISTORY: HTN ? ?PRECAUTIONS: Fall Risk ? ?SUBJECTIVE: "I went bowling last time after my session. I got a mat for the floor." ? ?PAIN:  ?Are you having pain? No  ? ? ? ? ?OBJECTIVE:  ? ?TODAY'S TREATMENT: ?09/08/2021 ?Rebounder with green/light ball with one handed technique with LUE.  ? ?Hand Gripper: with LUE on level 3 with black spring. Pt picked up 1 inch blocks with gripper with mod drops and mod difficulty. ? ?Pt reports mowing lawn still but has not approached hoeing, weeding and more gardening at this time.  ? ? ? ?09/05/21: ?Patient saw Neurology today prior to OT session.  Patient reports  discussing progress and potential for future gains with neuro.  Neuro reinforced the importance of slowing down with movement and activity.  Continued to work on functional mobility.  Patient with decreased left hip stability/strength.  Worked on sustaining activation in left hip in dynamic standing conditions.  Worked on gross motor coordination walking and spinning ball.  Patient lacks ability to control hip and knee simultaneously - working for coordination of all limbs.   ? ? ?  ? ?HOME EXERCISE PROGRAM ? ?08/18/21: [employer] Access Code: [email or web address] ? ? ? OT Short Term Goals - Met all STGs 08/03/21  ? ?  ? OT SHORT TERM GOAL #1  ? Title Patient will complete an HEP designed to improve coordination in LUE   ? Time 4   ? Period Weeks   ? Status Achieved  ? Target Date 09/01/21   ?  ? OT SHORT TERM GOAL #2  ? Title Patient will demonstrate ability to heat and serve simple warm meal for he and his wife with intermittent support   ? Time 4   ? Period Weeks   ? Status Achieved   reported doing higher cooking last night 07/27/21, continues to report cooking independently 08/03/21  ?  ? OT SHORT TERM GOAL #3  ? Title Patient will feed himself with appropriate use of utensils in  30% less time without increased spills/drops   ? Time 4   ? Period Weeks   ? Status Achieved   pt reports using chopsticks and the right utensils  ?  ? OT SHORT TERM GOAL #4  ? Title Patient will report improved effectiveness and efficiency with brushing his teeth using dominant LUE   ? Time 4   ? Period Weeks   ? Status Achieved   pt reports doing this consistently 07/27/21, 08/03/21  ? ?  ?  ? ?  ? ? ? OT Long Term Goals - check by 11/24/21  ? ?  ? OT LONG TERM GOAL #1  ? Title Patient will complete Home Activity Program designed to improve his ability to use LUE as dominant side again.   ? Time 8   ? Period Weeks   ? Status Ongoing  ? Target Date 11/24/21  ?  ? OT LONG TERM GOAL #2  ? Title Patient will play a round of golf (9 holes - par 3)  and use prior right hand swing.    ? Time 8   ? Period Weeks   ? Status ONGOING  ?  ? OT LONG TERM GOAL #3  ? Title Patient will be able to fully swing a golf club at 75% power  ? Time 8   ? Period Weeks   ? Status ONGOING approx 60% of power  ?  ? OT LONG TERM GOAL #4  ? Title Patient will demonstrate awareness of return to driving recommendations   ? Time 8   ? Period Weeks   ? Status Achieved pt cleared by MD and is now driving  ?  ? OT LONG TERM GOAL #5  ? Title Patient will return to gardening - weeding, raking, hoeing with intermittent assistance   ? Time 8   ? Period Weeks   ? Status Ongoing pt reports mowing part of lawn with no difficulty  ?  ? Long Term Additional Goals  ? Additional Long Term Goals Yes   ?  ? OT LONG TERM GOAL #6  ? Title Patient will prepare and cook full meal for he and his wife - includes food prep - peeling, chopping, as well as cooking, and clean up.   ? Time 8   ? Period Weeks   ? Status Achieved reports higher level cooking and prepping with no difficulty at home 08/11/21  ?OT LONG TERM GOAL #7 ?Title  #7 Patient will drink from a hot full mug of coffee or tea using his dominant left hand  ?Time 8   ?Period Weeks   ?Status Ongoing 11/24/21  ?OT LONG TERM GOAL #8 ?Title #8 Patient will demonstrate ability to carry a plate of food and cold drink across a crowded room using BUE without spilling.  ?Time 8   ?Period Weeks   ?Status Ongoing 11/25/19  ?  ?  ? ?  ? ? ? Plan -    ? ? Clinical Impression Statement Pt continues to make a lot of progress with LUE however continues to not be back to baseline.  ? OT Occupational Profile and History Detailed Assessment- Review of Records and additional review of physical, cognitive, psychosocial history related to current functional performance   ? Occupational performance deficits (Please refer to evaluation for details): ADL's;IADL's;Leisure   ? Body Structure / Function / Physical Skills ADL;Decreased knowledge of use of  DME;Balance;Dexterity;GMC;Tone;Body mechanics;UE functional use;IADL;Coordination;Flexibility;Mobility;FMC   ? Rehab Potential Excellent   ?  Clinical Decision Making Limited treatment options, no task modification necessary   ? Comorbidities Affecting Occupational Performance: May have comorbidities impacting occupational performance   ? Modification or Assistance to Complete Evaluation  No modification of tasks or assist necessary to complete eval   ? OT Frequency 2x / week x 2 weeks, then 1x/wk x 8 wks  ? OT Duration 8 weeks   ? OT Treatment/Interventions Self-care/ADL training;Balance training;Aquatic Therapy;Therapeutic activities;Therapeutic exercise;Neuromuscular education;Functional Mobility Training;Patient/family education;DME and/or AE instruction   ? Plan NMR - Functional use of LUE, review HEP for proximal strengthening, continue to work towards new goals and revised goals -   ? Consulted and Agree with Plan of Care Patient   ? ?  ?  ? ?  ? ? ? ?Zachery Conch, OT ?09/08/2021, 11:06 AM ? ?  ? ? ? ?

## 2021-09-11 ENCOUNTER — Telehealth: Payer: Self-pay | Admitting: Neurology

## 2021-09-11 DIAGNOSIS — I639 Cerebral infarction, unspecified: Secondary | ICD-10-CM

## 2021-09-11 NOTE — Telephone Encounter (Signed)
Pt calling requesting information about orders for his outpatient loop recorder for paroxysmal A-fib.Marland Kitchen  ?Would like to know when he will be receiving a referral to get this.  ?Pt has called the cardiovascular center at [facility] on Friday 09/08/21  and they had not received anything.  ?Pt would like a call back.  ?

## 2021-09-11 NOTE — Therapy (Signed)
?OUTPATIENT OCCUPATIONAL THERAPY TREATMENT NOTE ? ? ?Patient Name: Eddie Calhoun ?MRN: [id number] ?DOB:April 16, 1951, 71 y.o., male ?Today's Date: 09/12/2021 ? ?PCP: Josetta Huddle, MD ?REFERRING PROVIDER: Courtney Heys, MD ? ? ? OT End of Session - 09/12/21 1227   ? ? Visit Number 16   ? Number of Visits 25   ? Date for OT Re-Evaluation 11/24/21   ? Authorization Type Medicare and AARP supplement   ? Authorization - Number of Visits 16   ? Progress Note Due on Visit 18   ? OT Start Time 1023   ? OT Stop Time 1102   ? OT Time Calculation (min) 39 min   ? Activity Tolerance Patient tolerated treatment well   ? Behavior During Therapy [facility] for tasks assessed/performed   ? ?  ?  ? ?  ? ? ? ?  ? ?Past Medical History:  ?Diagnosis Date  ? Allergy   ? allergic rhinitis  ? BPH (benign prostatic hyperplasia)   ? Elevated prostate specific antigen (PSA)   ? Hypertension   ? Plantar fasciitis of right foot   ? Pneumonia   ? Thrombocytopenia, unspecified ([place]) 07/26/2012  ? 03/24/12  129,000!  ? ?History reviewed. No pertinent surgical history. ?Patient Active Problem List  ? Diagnosis Date Noted  ? Left foot drop 08/23/2021  ? 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 Acute ischemic stroke ([place]) 07/01/2021  ? Thrombocytopenia, unspecified ([place]) 07/26/2012  ? ? ?ONSET DATE: 07/01/21 ? ?REFERRING DIAG: corona radiata, right basal ganglia  ? ?THERAPY DIAG:  ?Hemiplegia and hemiparesis following cerebral infarction affecting left dominant side ([place]) ? ?Other lack of coordination ? ?Unsteadiness on feet ? ?Other abnormalities of gait and mobility ? ?Ataxia ? ? ?PERTINENT HISTORY: HTN ? ?PRECAUTIONS: Fall Risk ? ?SUBJECTIVE: I went to [employer] and walk 0.5 mi on treadmill on incline without brace.  ? ?PAIN:  ?Are you having pain? No  ? ? ? ? ?OBJECTIVE:  ? ?TODAY'S TREATMENT: ? ?09/12/21: ? ?Standing, working on dynamic step and wt. Shift to toss beanbags with LUE to target with 3lb wt. On wrist to simulate leisure activities (corn hole,  bowling).  Then repeated with 4lb wt. On wrist with improving accuracy and balance with wt. Shift/stepping. ? ?Standing, practiced raking mulch outside with min cueing for feet position (wider) for incr balance/wt. Shift.   Pt returned demo.   ? ?Bilateral hand coordination tasks (simultaneously) to stack/unstack 1-inch blocks in 10-block towers with min difficulty/decr alignment with RUE, then stacking/unstacking coins with min difficulty. ? ?Pt instructed to try picking up items off floor with feet apart and 1 foot in front of the other for incr stability.  Practiced using this technique (R foot in front using LUE for reach and then L foot in front using RUE for reach) with improved stability noted.   ? ?  ? ?HOME EXERCISE PROGRAM ? ?08/18/21: [employer] Access Code: [email or web address] ? ? ? OT Short Term Goals - Met all STGs 08/03/21  ? ?  ? OT SHORT TERM GOAL #1  ? Title Patient will complete an HEP designed to improve coordination in LUE   ? Time 4   ? Period Weeks   ? Status Achieved  ? Target Date 09/01/21   ?  ? OT SHORT TERM GOAL #2  ? Title Patient will demonstrate ability to heat and serve simple warm meal for he and his wife with intermittent support   ? Time 4   ? Period Weeks   ?  Status Achieved   reported doing higher cooking last night 07/27/21, continues to report cooking independently 08/03/21  ?  ? OT SHORT TERM GOAL #3  ? Title Patient will feed himself with appropriate use of utensils in 30% less time without increased spills/drops   ? Time 4   ? Period Weeks   ? Status Achieved   pt reports using chopsticks and the right utensils  ?  ? OT SHORT TERM GOAL #4  ? Title Patient will report improved effectiveness and efficiency with brushing his teeth using dominant LUE   ? Time 4   ? Period Weeks   ? Status Achieved   pt reports doing this consistently 07/27/21, 08/03/21  ? ?  ?  ? ?  ? ? ? OT Long Term Goals - check by 11/24/21  ? ?  ? OT LONG TERM GOAL #1  ? Title Patient will complete Home Activity  Program designed to improve his ability to use LUE as dominant side again.   ? Time 8   ? Period Weeks   ? Status Ongoing  ? Target Date 11/24/21  ?  ? OT LONG TERM GOAL #2  ? Title Patient will play a round of golf (9 holes - par 3) and use prior right hand swing.    ? Time 8   ? Period Weeks   ? Status ONGOING  ?  ? OT LONG TERM GOAL #3  ? Title Patient will be able to fully swing a golf club at 75% power  ? Time 8   ? Period Weeks   ? Status ONGOING approx 60% of power  ?  ? OT LONG TERM GOAL #4  ? Title Patient will demonstrate awareness of return to driving recommendations   ? Time 8   ? Period Weeks   ? Status Achieved pt cleared by MD and is now driving  ?  ? OT LONG TERM GOAL #5  ? Title Patient will return to gardening - weeding, raking, hoeing with intermittent assistance   ? Time 8   ? Period Weeks   ? Status Ongoing pt reports mowing part of lawn with no difficulty  ?  ? Long Term Additional Goals  ? Additional Long Term Goals Yes   ?  ? OT LONG TERM GOAL #6  ? Title Patient will prepare and cook full meal for he and his wife - includes food prep - peeling, chopping, as well as cooking, and clean up.   ? Time 8   ? Period Weeks   ? Status Achieved reports higher level cooking and prepping with no difficulty at home 08/11/21  ?OT LONG TERM GOAL #7 ?Title  #7 Patient will drink from a hot full mug of coffee or tea using his dominant left hand  ?Time 8   ?Period Weeks   ?Status Ongoing 11/24/21  ?OT LONG TERM GOAL #8 ?Title #8 Patient will demonstrate ability to carry a plate of food and cold drink across a crowded room using BUE without spilling.  ?Time 8   ?Period Weeks   ?Status Ongoing 11/25/19  ?  ?  ? ?  ? ? ? Plan -    ? ? Clinical Impression Statement Pt continues to progress with balance and LUE functional use; however, pt continues to fatigue quickly and have more difficulty with higher- ?level tasks.  ? OT Occupational Profile and History Detailed Assessment- Review of Records and additional review  of physical, cognitive, psychosocial history related  to current functional performance   ? Occupational performance deficits (Please refer to evaluation for details): ADL's;IADL's;Leisure   ? Body Structure / Function / Physical Skills ADL;Decreased knowledge of use of DME;Balance;Dexterity;GMC;Tone;Body mechanics;UE functional use;IADL;Coordination;Flexibility;Mobility;FMC   ? Rehab Potential Excellent   ? Clinical Decision Making Limited treatment options, no task modification necessary   ? Comorbidities Affecting Occupational Performance: May have comorbidities impacting occupational performance   ? Modification or Assistance to Complete Evaluation  No modification of tasks or assist necessary to complete eval   ? OT Frequency 2x / week x 2 weeks, then 1x/wk x 8 wks  ? OT Duration 8 weeks   ? OT Treatment/Interventions Self-care/ADL training;Balance training;Aquatic Therapy;Therapeutic activities;Therapeutic exercise;Neuromuscular education;Functional Mobility Training;Patient/family education;DME and/or AE instruction   ? Plan NMR - Functional use of LUE, review HEP for proximal strengthening, continue to work towards new goals and revised goals  ? Consulted and Agree with Plan of Care Patient   ? ?  ?  ? ?  ? ? ? Vianne Bulls, [place] ?09/12/2021, 1:53 PM ? ? ?  ?Vianne Bulls, OTR/L ?[place] ?[place], [place]  [phone] ?[phone] phone ?[phone] ?09/12/21 1:53 PM ? ? ? ? ?

## 2021-09-11 NOTE — Telephone Encounter (Signed)
Cardiac event monitor order placed again. ?

## 2021-09-12 ENCOUNTER — Ambulatory Visit: Payer: Medicare Other | Admitting: Occupational Therapy

## 2021-09-12 ENCOUNTER — Telehealth: Payer: Self-pay | Admitting: Cardiology

## 2021-09-12 ENCOUNTER — Ambulatory Visit: Payer: Medicare Other | Admitting: Physical Therapy

## 2021-09-12 ENCOUNTER — Encounter: Payer: Self-pay | Admitting: Occupational Therapy

## 2021-09-12 ENCOUNTER — Encounter: Payer: Self-pay | Admitting: Physical Therapy

## 2021-09-12 DIAGNOSIS — M6281 Muscle weakness (generalized): Secondary | ICD-10-CM

## 2021-09-12 DIAGNOSIS — R270 Ataxia, unspecified: Secondary | ICD-10-CM

## 2021-09-12 DIAGNOSIS — R2689 Other abnormalities of gait and mobility: Secondary | ICD-10-CM

## 2021-09-12 DIAGNOSIS — R2681 Unsteadiness on feet: Secondary | ICD-10-CM

## 2021-09-12 DIAGNOSIS — R278 Other lack of coordination: Secondary | ICD-10-CM

## 2021-09-12 DIAGNOSIS — I69352 Hemiplegia and hemiparesis following cerebral infarction affecting left dominant side: Secondary | ICD-10-CM

## 2021-09-12 NOTE — Telephone Encounter (Signed)
Pt calling to f/u on order that was sent by Neurologist for a Cardiac Event Monitor. Pt would like to schedule if possible. Please advise ?

## 2021-09-12 NOTE — Telephone Encounter (Signed)
Patient contacted [facility] office regarding an order for a "Loop recorder".  Explained to patient an order for a cardiac event monitor had been placed but put in to be done by [facility] in error (CHF clinic).  It was written in text Loop Recorder for PAF.  Explained to patient a Cardiac event monitor is usually done as a precursor to a Loop recorder because some insurance companies require and event monitor first. ?Patient was adamant he was told the order was for a Loop monitor.  Explained to patient, I was calling from the monitor department at [facility].  A Loop recorder would require a referral directly to the Electrophysiologist.  I will forward the message to Dr. Leonie Man and his nurse, Cyril Mourning Dinkins. ?

## 2021-09-12 NOTE — Telephone Encounter (Signed)
Dr. Leonie Man has ordered a cardiac event monitor and in the comments he has specified: "Loop recorder for PAF." ? ?Apparently [facility] can't do this? Can you guys take a look at this and send it to the appropriate office? ?

## 2021-09-12 NOTE — Therapy (Signed)
?OUTPATIENT PHYSICAL THERAPY TREATMENT NOTE ? ? ?Patient Name: Eddie Calhoun ?MRN: [id number] ?DOB:October 10, 1950, 71 y.o., male ?Today's Date: 09/12/2021 ? ?PCP: Josetta Huddle, MD ?REFERRING PROVIDER: Courtney Heys, MD ? ? PT End of Session - 09/12/21 1109   ? ? Visit Number 12   ? Number of Visits 13   12+eval  ? Date for PT Re-Evaluation 11/24/21   ? Authorization Type Medicare & Aarp Supplement  Covered 100%  VL: Follows Medicare guidelines-10th visit PN   ? Progress Note Due on Visit 20   ? PT Start Time 1103   ? PT Stop Time 1150   ? PT Time Calculation (min) 47 min   ? Equipment Utilized During Treatment Other (comment);Gait belt   L AFO  ? Activity Tolerance Patient tolerated treatment well   ? Behavior During Therapy [facility] for tasks assessed/performed   ? ?  ?  ? ?  ? ? ? ? ? ?Past Medical History:  ?Diagnosis Date  ? Allergy   ? allergic rhinitis  ? BPH (benign prostatic hyperplasia)   ? Elevated prostate specific antigen (PSA)   ? Hypertension   ? Plantar fasciitis of right foot   ? Pneumonia   ? Thrombocytopenia, unspecified ([place]) 07/26/2012  ? 03/24/12  129,000!  ? ?History reviewed. No pertinent surgical history. ?Patient Active Problem List  ? Diagnosis Date Noted  ? Left foot drop 08/23/2021  ? 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 Acute ischemic stroke ([place]) 07/01/2021  ? Thrombocytopenia, unspecified ([place]) 07/26/2012  ? ? ?REFERRING DIAG: I63.9 (ICD-10-CM) - CVA (cerebral vascular accident) ([place])  ? ?THERAPY DIAG:  ?Hemiplegia and hemiparesis following cerebral infarction affecting left dominant side ([place]) ? ?Muscle weakness (generalized) ? ?Other lack of coordination ? ?Unsteadiness on feet ? ?PERTINENT HISTORY: Admitted to ED 07/01/2021 for R CVA w/ Left hemiparesis.  He had carpal tunnel surgery in early February 2023.  Discharged from inpatient rehab 07/17/2021.  ?PMH:  chronic thrombocytopenia, BPH, hyperlipidemia, HN, AKI ? ?PRECAUTIONS: Fall ? ?SUBJECTIVE: He has his loop recorder scheduling today.   Wife has appts for shoulder surgery today in [place].  He is glad to be re-certing.  Bowled 9 games and his average is improving.  He has been using treadmill without foot-up brace and notices is endurance is improving when at [employer]. ? ?PAIN:  ?Are you having pain? No ? ? ?OBJECTIVE:  ?  ?DIAGNOSTIC FINDINGS: MRI 07/02/2021 showed Acute perforator infarct at the right basal ganglia and corona radiata. ?  ?TODAY'S TREATMENT: ?Discussed scheduling at onset of session. ?Pt is wearing new shoes w/ inc wedge lift on sole placing foot in PF position in midstance. ?Reassessed LTGs: ? 09/12/21 1144  ?Functional Gait  Assessment  ?Gait assessed  Yes  ?Gait Level Surface 2  ?Change in Gait Speed 3  ?Gait with Horizontal Head Turns 3  ?Gait with Vertical Head Turns 3  ?Gait and Pivot Turn 2  ?Step Over Obstacle 3  ?Gait with Narrow Base of Support 2  ?Gait with Eyes Closed 2  ?Ambulating Backwards 2  ?Steps 3  ?Total Score 25  ? ?Pt ambulates 1150' w/o AD supervision level w/ intermittent CGA due to L toe catch x6, pt able to step and recover independently ?2MWT:  340' w/ supervision no AD ?10MWT:  8.05 seconds = 1.24 m/sec OR 4.10 ft/sec 09/12/2021 ? ? ?  ?PATIENT EDUCATION: ?Education details: Plan for re-cert, scheduling. ?Person educated: Patient ?Education method: Explanation  ?Education comprehension: verbalized understanding ?  ?  ?  HOME EXERCISE PROGRAM: ?Access Code: [email or web address] ?URL: [email or web address] ?Date: 09/05/2021 ?Prepared by: Elease Etienne ? ?Exercises ?- Gastroc Stretch on Wall  - 1 x daily - 7 x weekly - 2 sets - 2 reps - 30 sec hold ?- Seated Ankle Dorsiflexion with Anchored Resistance  - 1 x daily - 7 x weekly - 3 sets - 10 reps ?- Eccentric Heel Lowering on Step  - 1 x daily - 7 x weekly - 3 sets - 10 reps ?- Forward Step Down  - 1 x daily - 7 x weekly - 3 sets - 10 reps ?- Squat Jumps  - 1 x daily - 7 x weekly - 3 sets - 10 reps ?- Kettlebell Deadlift  - 1 x daily - 7 x weekly -  3 sets - 10 reps ?- Single Leg Stance on Foam Pad  - 1 x daily - 5 x weekly - 3 sets - 10 reps -encouraged pt to use toe touch with RLE when LLE in stance for appropriate challenge w/o requiring UE support ?- Standing with Feet Together on Foam Pad   - 1 x daily - 5 x weekly - 3 sets - 10 reps -added eyes closed for inc challenge same frequency ?- Romberg Stance Eyes Closed on Foam Pad  - 1 x daily - 5 x weekly - 3 sets - 10 reps -pt instructed to start with eyes open and use back of chair in front for added 3rd pt for balance as needed.   ?- Tandem Stance on Foam Pad with Eyes Closed  - 1 x daily - 5 x weekly - 3 sets - 10 reps ?*Pt encourage to vary length of hold time to match length of time he can hold position w/o LOB.  Demo'd appropriate corner setup at home for all standing balance tasks.   ? ?ASSESSMENT: ?  ?CLINICAL IMPRESSION: ?Long term goals reassessed and revised to reflect current functional status today.  Improved FGA score of 25/30 reflects decreased fall risk.  His gait speed has improved to unlimited community ambulator speed of 4.10 ft/sec.  He had increased difficulty with toe clearance today due to new shoes w/ inc wedge sole placing foot in PF and ambulating w/o use of foot-up resulting in multiple bouts of left toe catching limiting the distance he was able to complete in 2 mins.  He is compliant to HEP w/ additional regular [employer] attendance.  Will re-cert today with plans for addressing higher level balance and motor control in future visits. ?  ?  ?OBJECTIVE IMPAIRMENTS Abnormal gait, decreased balance, decreased coordination, decreased knowledge of use of DME, difficulty walking, decreased ROM, decreased strength, hypomobility, and impaired flexibility.  ?  ?ACTIVITY LIMITATIONS cleaning, community activity, driving, laundry, yard work, and hobbies: golfing and bowling .  ?  ?PERSONAL FACTORS Age, Past/current experiences, and 3+ comorbidities:  chronic thrombocytopenia, BPH,  hyperlipidemia, HN, AKI  are also affecting patient's functional outcome.  ?  ?  ?REHAB POTENTIAL: Excellent ?  ?CLINICAL DECISION MAKING: Stable/uncomplicated ?  ?EVALUATION COMPLEXITY: Low ?  ?  ?OLD GOALS: ?Goals reviewed with patient? Yes ?  ?SHORT TERM GOALS: Target date:  08/18/2021 ?  ?Pt will be maintain initial strength and balance HEP with supervision from family. ?Baseline: Pt is performing HEP + [employer] fitness 08/18/2021 ?Goal status: MET ?  ?2.  Pt will decrease 5xSTS to <13 seconds in order to demonstrate decreased risk for falls and improved functional bilateral LE strength and power.  ?Baseline:  10.78sec w/o UE (UE crossed across chest) from standard chair height ?Goal status: MET ?  ?3.  Pt will demonstrate improved LLE coordination and appropriate speed for safety with gait using LRAD.  ?Baseline: L AFO donned w/ improved L ataxia, pt able to maintain appropriate speed ?Goal status: MET ?  ?4.  Pt will demonstrate TUG of <9 seconds in order to decrease risk of falls and improve functional mobility using LRAD. ?Baseline: 8.25sec no AD 08/18/2021 w/ less LLE ataxia and improved control with speed w/o LOB ?Goal status: MET ?  ?5.  FGA will be assessed and goal set on visit 2. ?           Baseline:  Assessed 07/27/2021 ?           Goal status:  MET ?  ?LONG TERM GOALS: Target date:  09/15/2021 ?  ?Pt will be independent with finalized strength and balance HEP. ?Baseline: Not initiated on eval; Pt continues to be compliant to HEP verbalizing routine and at [facility] fitness. ?Goal status: MET ?  ?2.  Pt will ambulate>500 feet on 2MWT to demonstrate improved endurance for functional tasks in home and community.  ?Baseline: 27' w/ SPC w/ quad tip; 340' supervision no AD ?Goal status: PARTIALLY MET ?  ?3.  Pt will ambulate at least 1000 feet with LRAD at independent level of assist with safe speed and improved LLE placement to promote household and community access. ?Baseline: ataxia 385', scissoring of the LLE using  SPC w/ quad tip; 1150' w/o AD supervision level 09/12/2021 ?Goal status: PARTIALLY MET ?  ?4.  Pt will improve FGA score to 25/30 in order to demonstrate improved balance and decreased fall risk. ?Baseline:

## 2021-09-14 ENCOUNTER — Telehealth: Payer: Self-pay | Admitting: Neurology

## 2021-09-14 ENCOUNTER — Other Ambulatory Visit: Payer: Self-pay | Admitting: Neurology

## 2021-09-14 DIAGNOSIS — I639 Cerebral infarction, unspecified: Secondary | ICD-10-CM

## 2021-09-14 DIAGNOSIS — I480 Paroxysmal atrial fibrillation: Secondary | ICD-10-CM

## 2021-09-14 NOTE — Telephone Encounter (Addendum)
Form received and placed in Dr. Clydene Fake office for review. I had informed Erline Levine that he would not be back in the office until 09/21/21. She said this was just to have in the patient's file. He does not currently have a pending appt. We will fax it back when completed.  ?

## 2021-09-14 NOTE — Telephone Encounter (Signed)
Eddie Calhoun's office Erline Levine) faxed over a form for clearance on 08/29/21.. Would like a call from the nurse to discuss details of clearance. ?

## 2021-09-14 NOTE — Telephone Encounter (Signed)
I returned the call to patient's dentist office. They are asking for clearance for cleaning and any type of dental procedure the patient may need when seen (such as a filling). We never received the from. I spoke to [place] and she will re-fax it to our office for Dr. Clydene Fake review. ?

## 2021-09-15 ENCOUNTER — Ambulatory Visit: Payer: Medicare Other | Admitting: Occupational Therapy

## 2021-09-15 ENCOUNTER — Encounter: Payer: Self-pay | Admitting: Occupational Therapy

## 2021-09-15 DIAGNOSIS — R2689 Other abnormalities of gait and mobility: Secondary | ICD-10-CM

## 2021-09-15 DIAGNOSIS — R2681 Unsteadiness on feet: Secondary | ICD-10-CM

## 2021-09-15 DIAGNOSIS — I69352 Hemiplegia and hemiparesis following cerebral infarction affecting left dominant side: Secondary | ICD-10-CM

## 2021-09-15 DIAGNOSIS — R278 Other lack of coordination: Secondary | ICD-10-CM

## 2021-09-15 DIAGNOSIS — M6281 Muscle weakness (generalized): Secondary | ICD-10-CM

## 2021-09-15 NOTE — Therapy (Signed)
?OUTPATIENT OCCUPATIONAL THERAPY TREATMENT NOTE ? ? ?Patient Name: Eddie Calhoun ?MRN: [id number] ?DOB:1950/07/13, 71 y.o., male ?Today's Date: 09/15/2021 ? ?PCP: Josetta Huddle, MD ?REFERRING PROVIDER: Courtney Heys, MD ? ? ? OT End of Session - 09/15/21 1052   ? ? Visit Number 17   ? Number of Visits 25   ? Date for OT Re-Evaluation 11/24/21   ? Authorization Type Medicare and AARP supplement   ? Progress Note Due on Visit 18   ? OT Start Time 1045   ? OT Stop Time 1130   ? OT Time Calculation (min) 45 min   ? Activity Tolerance Patient tolerated treatment well   ? Behavior During Therapy [facility] for tasks assessed/performed   ? ?  ?  ? ?  ? ? ? ? ?  ? ?Past Medical History:  ?Diagnosis Date  ? Allergy   ? allergic rhinitis  ? BPH (benign prostatic hyperplasia)   ? Elevated prostate specific antigen (PSA)   ? Hypertension   ? Plantar fasciitis of right foot   ? Pneumonia   ? Thrombocytopenia, unspecified ([place]) 07/26/2012  ? 03/24/12  129,000!  ? ?History reviewed. No pertinent surgical history. ?Patient Active Problem List  ? Diagnosis Date Noted  ? Left foot drop 08/23/2021  ? 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 Acute ischemic stroke ([place]) 07/01/2021  ? Thrombocytopenia, unspecified ([place]) 07/26/2012  ? ? ?ONSET DATE: 07/01/21 ? ?REFERRING DIAG: corona radiata, right basal ganglia  ? ?THERAPY DIAG:  ?Hemiplegia and hemiparesis following cerebral infarction affecting left dominant side ([place]) ? ?Other lack of coordination ? ?Unsteadiness on feet ? ?Muscle weakness (generalized) ? ?Other abnormalities of gait and mobility ? ? ?PERTINENT HISTORY: HTN ? ?PRECAUTIONS: Fall Risk ? ?SUBJECTIVE: Pt reported going to the golf course and walking the course and playing 9 holes. ? ?PAIN:  ?Are you having pain? No  ? ? ? ? ?OBJECTIVE:  ? ?TODAY'S TREATMENT: ? ?09/15/21 ?Pt reports doing things with increased ease - carrying coffee cup, working on stepping. Pt has reported doing 9 holes and walking the course with AFO. ? ?Dynamic  step and reach with LUE with 1 inch blocks and placing on top of mirror with sustained weight bearing into LLE and reaching to high level with LUE.  ? ?Standing weighted 2kg/4.5lbs medball x 10 reps, bicep curl to shoulder press, chest press, horizontal abduction  ? ?Dual tasking with tossing medium ball while walking around clinic, added word finding/generation with ABC foods with min difficulty. Pt then worked on step cadence with unilateral stability and stepping with alternating leg with min/mod difficulty.  ? ?Arm Bike: for conditioning and reciprocal movements on Level 5 for 8 minutes (forward and backward)  ? ?  ? ?HOME EXERCISE PROGRAM ? ?08/18/21: Yellow Theraband Access Code: [email or web address] ? ? ? OT Short Term Goals - Met all STGs 08/03/21  ? ?  ? OT SHORT TERM GOAL #1  ? Title Patient will complete an HEP designed to improve coordination in LUE   ? Time 4   ? Period Weeks   ? Status Achieved  ? Target Date 09/01/21   ?  ? OT SHORT TERM GOAL #2  ? Title Patient will demonstrate ability to heat and serve simple warm meal for he and his wife with intermittent support   ? Time 4   ? Period Weeks   ? Status Achieved   reported doing higher cooking last night 07/27/21, continues to report cooking independently 08/03/21  ?  ?  OT SHORT TERM GOAL #3  ? Title Patient will feed himself with appropriate use of utensils in 30% less time without increased spills/drops   ? Time 4   ? Period Weeks   ? Status Achieved   pt reports using chopsticks and the right utensils  ?  ? OT SHORT TERM GOAL #4  ? Title Patient will report improved effectiveness and efficiency with brushing his teeth using dominant LUE   ? Time 4   ? Period Weeks   ? Status Achieved   pt reports doing this consistently 07/27/21, 08/03/21  ? ?  ?  ? ?  ? ? ? OT Long Term Goals - check by 11/24/21  ? ?  ? OT LONG TERM GOAL #1  ? Title Patient will complete Home Activity Program designed to improve his ability to use LUE as dominant side again.   ? Time 8   ? Period  Weeks   ? Status Ongoing  ? Target Date 11/24/21  ?  ? OT LONG TERM GOAL #2  ? Title Patient will play a round of golf (9 holes - par 3) and use prior right hand swing.    ? Time 8   ? Period Weeks   ? Status ONGOING  ?  ? OT LONG TERM GOAL #3  ? Title Patient will be able to fully swing a golf club at 75% power  ? Time 8   ? Period Weeks   ? Status ONGOING approx 60% of power  ?  ? OT LONG TERM GOAL #4  ? Title Patient will demonstrate awareness of return to driving recommendations   ? Time 8   ? Period Weeks   ? Status Achieved pt cleared by MD and is now driving  ?  ? OT LONG TERM GOAL #5  ? Title Patient will return to gardening - weeding, raking, hoeing with intermittent assistance   ? Time 8   ? Period Weeks   ? Status Ongoing pt reports mowing part of lawn with no difficulty  ?  ? Long Term Additional Goals  ? Additional Long Term Goals Yes   ?  ? OT LONG TERM GOAL #6  ? Title Patient will prepare and cook full meal for he and his wife - includes food prep - peeling, chopping, as well as cooking, and clean up.   ? Time 8   ? Period Weeks   ? Status Achieved reports higher level cooking and prepping with no difficulty at home 08/11/21  ?OT LONG TERM GOAL #7 ?Title  #7 Patient will drink from a hot full mug of coffee or tea using his dominant left hand  ?Time 8   ?Period Weeks   ?Status Ongoing 11/24/21  ?OT LONG TERM GOAL #8 ?Title #8 Patient will demonstrate ability to carry a plate of food and cold drink across a crowded room using BUE without spilling.  ?Time 8   ?Period Weeks   ?Status Ongoing 11/25/19  ?  ?  ? ?  ? ? ? Plan -    ? ? Clinical Impression Statement Pt continues to progress towards prior activities with good activity tolerance. Pt continues to have some difficulty with dual tasks with ambulation and carrying objects, etc.  ? OT Occupational Profile and History Detailed Assessment- Review of Records and additional review of physical, cognitive, psychosocial history related to current functional  performance   ? Occupational performance deficits (Please refer to evaluation for details): ADL's;IADL's;Leisure   ?  Body Structure / Function / Physical Skills ADL;Decreased knowledge of use of DME;Balance;Dexterity;GMC;Tone;Body mechanics;UE functional use;IADL;Coordination;Flexibility;Mobility;FMC   ? Rehab Potential Excellent   ? Clinical Decision Making Limited treatment options, no task modification necessary   ? Comorbidities Affecting Occupational Performance: May have comorbidities impacting occupational performance   ? Modification or Assistance to Complete Evaluation  No modification of tasks or assist necessary to complete eval   ? OT Frequency 2x / week x 2 weeks, then 1x/wk x 8 wks  ? OT Duration 8 weeks   ? OT Treatment/Interventions Self-care/ADL training;Balance training;Aquatic Therapy;Therapeutic activities;Therapeutic exercise;Neuromuscular education;Functional Mobility Training;Patient/family education;DME and/or AE instruction   ? Plan NMR - Functional use of LUE, review HEP for proximal strengthening, continue to work towards new goals and revised goals  ? Consulted and Agree with Plan of Care Patient   ? ?  ?  ? ?  ? ? ? ?Zachery Conch, OT ?09/15/2021, 11:26 AM ? ? ?  ?

## 2021-09-20 ENCOUNTER — Encounter: Payer: Self-pay | Admitting: Occupational Therapy

## 2021-09-20 ENCOUNTER — Ambulatory Visit (INDEPENDENT_AMBULATORY_CARE_PROVIDER_SITE_OTHER): Payer: Medicare Other

## 2021-09-20 ENCOUNTER — Ambulatory Visit: Payer: Medicare Other | Admitting: Occupational Therapy

## 2021-09-20 DIAGNOSIS — R278 Other lack of coordination: Secondary | ICD-10-CM

## 2021-09-20 DIAGNOSIS — I69352 Hemiplegia and hemiparesis following cerebral infarction affecting left dominant side: Secondary | ICD-10-CM

## 2021-09-20 DIAGNOSIS — I639 Cerebral infarction, unspecified: Secondary | ICD-10-CM

## 2021-09-20 DIAGNOSIS — I480 Paroxysmal atrial fibrillation: Secondary | ICD-10-CM

## 2021-09-20 DIAGNOSIS — R2681 Unsteadiness on feet: Secondary | ICD-10-CM

## 2021-09-20 DIAGNOSIS — R2689 Other abnormalities of gait and mobility: Secondary | ICD-10-CM

## 2021-09-20 DIAGNOSIS — M6281 Muscle weakness (generalized): Secondary | ICD-10-CM

## 2021-09-20 NOTE — Therapy (Signed)
?OUTPATIENT OCCUPATIONAL THERAPY TREATMENT NOTE ? ? ?Patient Name: Eddie Calhoun ?MRN: [id number] ?DOB:July 20, 1950, 71 y.o., male ?Today's Date: 09/20/2021 ? ?PCP: Josetta Huddle, MD ?REFERRING PROVIDER: Josetta Huddle, MD ? ? ? OT End of Session - 09/20/21 9444   ? ? Visit Number 18   ? Number of Visits 25   ? Date for OT Re-Evaluation 11/24/21   ? Authorization Type Medicare and AARP supplement   ? Progress Note Due on Visit 24   ? OT Start Time [phone]   ? OT Stop Time 1008   ? OT Time Calculation (min) 44 min   ? Activity Tolerance Patient tolerated treatment well   ? Behavior During Therapy [facility] for tasks assessed/performed   ? ?  ?  ? ?  ? ? ? ? ?  ? ?Past Medical History:  ?Diagnosis Date  ? Allergy   ? allergic rhinitis  ? BPH (benign prostatic hyperplasia)   ? Elevated prostate specific antigen (PSA)   ? Hypertension   ? Plantar fasciitis of right foot   ? Pneumonia   ? Thrombocytopenia, unspecified ([place]) 07/26/2012  ? 03/24/12  129,000!  ? ?History reviewed. No pertinent surgical history. ?Patient Active Problem List  ? Diagnosis Date Noted  ? Left foot drop 08/23/2021  ? 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 Acute ischemic stroke ([place]) 07/01/2021  ? Thrombocytopenia, unspecified ([place]) 07/26/2012  ? ? ?ONSET DATE: 07/01/21 ? ?REFERRING DIAG: corona radiata, right basal ganglia  ? ?THERAPY DIAG:  ?Other lack of coordination ? ?Unsteadiness on feet ? ?Hemiplegia and hemiparesis following cerebral infarction affecting left dominant side ([place]) ? ?Muscle weakness (generalized) ? ?Other abnormalities of gait and mobility ? ? ?PERTINENT HISTORY: HTN ? ?PRECAUTIONS: Fall Risk ? ?SUBJECTIVE: Pt reported bowling on Friday without his brace with no decrease in score.  ? ?PAIN:  ?Are you having pain? No  ? ? ? ? ?OBJECTIVE:  ? ?TODAY'S TREATMENT: ? ?09/20/21 ?Reviewed goals. See below. ? ?Grooved Pegs with LUE for increased coordination. Pt placed pegs with in hand manipulation and removed with in hand manipulation. Pt  completed with min difficulty and min drops.  ? ?Resistance Clothespins 1-8# with LUE for mid and high functional reaching and sustained pinch. Pt req'd no cues for movement pattern and maintaining good positioning of LUE with reach. ? ?Carrying plate of balls and 2lb kettlebell with 2 spills of balls in LUE. Pt carried kettlebell in RUE in stein hold. ? ?Simulated bowling with 3kg ball and medium cones with pt demonstrating his bimanual hold and step pattern with no LOB and appropriate manipulation and hold on ball ?  ? ?[place] ? ?08/18/21: Yellow Theraband Access Code: [email or web address] ? ? ? OT Short Term Goals - Met all STGs 08/03/21  ? ?  ? OT SHORT TERM GOAL #1  ? Title Patient will complete an HEP designed to improve coordination in LUE   ? Time 4   ? Period Weeks   ? Status Achieved  ? Target Date 09/01/21   ?  ? OT SHORT TERM GOAL #2  ? Title Patient will demonstrate ability to heat and serve simple warm meal for he and his wife with intermittent support   ? Time 4   ? Period Weeks   ? Status Achieved   reported doing higher cooking last night 07/27/21, continues to report cooking independently 08/03/21  ?  ? OT SHORT TERM GOAL #3  ? Title Patient will feed himself with appropriate use of  utensils in 30% less time without increased spills/drops   ? Time 4   ? Period Weeks   ? Status Achieved   pt reports using chopsticks and the right utensils  ?  ? OT SHORT TERM GOAL #4  ? Title Patient will report improved effectiveness and efficiency with brushing his teeth using dominant LUE   ? Time 4   ? Period Weeks   ? Status Achieved   pt reports doing this consistently 07/27/21, 08/03/21  ? ?  ?  ? ?  ? ? ? OT Long Term Goals - check by 11/24/21  ? ?  ? OT LONG TERM GOAL #1  ? Title Patient will complete Home Activity Program designed to improve his ability to use LUE as dominant side again.   ? Time 8   ? Period Weeks   ? Status Ongoing  ? Target Date 11/24/21  ?  ? OT LONG TERM GOAL #2  ? Title Patient will play  a round of golf (9 holes - par 3) and use prior right hand swing.    ? Time 8   ? Period Weeks   ? Status ACHIEVED  ?  ? OT LONG TERM GOAL #3  ? Title Patient will be able to fully swing a golf club at 75% power  ? Time 8   ? Period Weeks   ? Status ONGOING approx 70% of power per pt report  ?  ? OT LONG TERM GOAL #4  ? Title Patient will demonstrate awareness of return to driving recommendations   ? Time 8   ? Period Weeks   ? Status Achieved pt cleared by MD and is now driving  ?  ? OT LONG TERM GOAL #5  ? Title Patient will return to gardening - weeding, raking, hoeing with intermittent assistance   ? Time 8   ? Period Weeks   ? Status Ongoing pt reports doing some of it but still tires easily  ?  ? Long Term Additional Goals  ? Additional Long Term Goals Yes   ?  ? OT LONG TERM GOAL #6  ? Title Patient will prepare and cook full meal for he and his wife - includes food prep - peeling, chopping, as well as cooking, and clean up.   ? Time 8   ? Period Weeks   ? Status Achieved reports higher level cooking and prepping with no difficulty at home 08/11/21  ?OT LONG TERM GOAL #7 ?Title  #7 Patient will drink from a hot full mug of coffee or tea using his dominant left hand  ?Time 8   ?Period Weeks   ?Status Ongoing 11/24/21 still working on goal per pt report  ?OT LONG TERM GOAL #8 ?Title #8 Patient will demonstrate ability to carry a plate of food and cold drink across a crowded room using BUE without spilling.  ?Time 8   ?Period Weeks   ?Status Ongoing 11/25/19 pt reports completing but with increased time and attention  ?  ?  ? ?  ? ? ? Plan -    ? ? Clinical Impression Statement Assessed and reviewed goals. See goal statuses. Pt continues to work towards unmet goals and progress towards PLOF   ? OT Occupational Profile and History Detailed Assessment- Review of Records and additional review of physical, cognitive, psychosocial history related to current functional performance   ? Occupational performance deficits  (Please refer to evaluation for details): ADL's;IADL's;Leisure   ? Body Structure /  Function / Physical Skills ADL;Decreased knowledge of use of DME;Balance;Dexterity;GMC;Tone;Body mechanics;UE functional use;IADL;Coordination;Flexibility;Mobility;FMC   ? Rehab Potential Excellent   ? Clinical Decision Making Limited treatment options, no task modification necessary   ? Comorbidities Affecting Occupational Performance: May have comorbidities impacting occupational performance   ? Modification or Assistance to Complete Evaluation  No modification of tasks or assist necessary to complete eval   ? OT Frequency 2x / week x 2 weeks, then 1x/wk x 8 wks  ? OT Duration 8 weeks   ? OT Treatment/Interventions Self-care/ADL training;Balance training;Aquatic Therapy;Therapeutic activities;Therapeutic exercise;Neuromuscular education;Functional Mobility Training;Patient/family education;DME and/or AE instruction   ? Plan NMR - Functional use of LUE, review HEP for proximal strengthening, continue to work towards new goals and revised goals  ? Consulted and Agree with Plan of Care Patient   ? ?  ?  ? ?  ? ? ? ?Zachery Conch, OT ?09/20/2021, 10:27 AM ? ? ?  ?

## 2021-09-20 NOTE — Progress Notes (Unsigned)
[employer] event monitor serial # [id number] from office inventory applied to patient. ? ?Dr. Marlou Porch to read. ? ?Insurance requirement for Loop recorder rule out PAF.  Refer to EP for Loop if PAF not captured per Dr. Leonie Man. ?

## 2021-09-21 ENCOUNTER — Encounter: Payer: Self-pay | Admitting: Physical Medicine and Rehabilitation

## 2021-09-26 ENCOUNTER — Encounter: Payer: PRIVATE HEALTH INSURANCE | Admitting: Occupational Therapy

## 2021-09-27 ENCOUNTER — Ambulatory Visit: Payer: Medicare Other | Admitting: Occupational Therapy

## 2021-09-27 DIAGNOSIS — R278 Other lack of coordination: Secondary | ICD-10-CM

## 2021-09-27 DIAGNOSIS — I69352 Hemiplegia and hemiparesis following cerebral infarction affecting left dominant side: Secondary | ICD-10-CM

## 2021-09-27 DIAGNOSIS — R2681 Unsteadiness on feet: Secondary | ICD-10-CM

## 2021-09-27 DIAGNOSIS — M6281 Muscle weakness (generalized): Secondary | ICD-10-CM

## 2021-09-27 NOTE — Therapy (Signed)
OUTPATIENT OCCUPATIONAL THERAPY TREATMENT NOTE   Patient Name: Eddie Calhoun MRN: [id number] DOB:15-Mar-1951, 71 y.o., male Today's Date: 09/27/2021  PCP: Josetta Huddle, MD REFERRING PROVIDER: Josetta Huddle, MD       OT End of Session - 09/27/21 1106     Visit Number 19    Number of Visits 25    Date for OT Re-Evaluation 11/24/21    Authorization Type Medicare and AARP supplement    Authorization - Number of Visits 18   corrected count   Progress Note Due on Visit 24    OT Start Time 1105    OT Stop Time 1145    OT Time Calculation (min) 40 min    Activity Tolerance Patient tolerated treatment well    Behavior During Therapy [facility] for tasks assessed/performed                   Past Medical History:  Diagnosis Date   Allergy    allergic rhinitis   BPH (benign prostatic hyperplasia)    Elevated prostate specific antigen (PSA)    Hypertension    Plantar fasciitis of right foot    Pneumonia    Thrombocytopenia, unspecified ([place]) 07/26/2012   03/24/12  129,000!   No past surgical history on file. 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 No      OBJECTIVE:   TODAY'S TREATMENT:  09/27/21   Reviewed yellow theraband (Yellow Theraband Access Code: [email or web address]),   10 reps each, min v.c for positioning   Grooved Pegs with LUE for increased coordination picking up 3 at a time  to manipulate in hand. Pt placed pegs with in hand manipulation and removed with tweezers for sustained pinch. Pt completed with min difficulty and min drops.   Simulated use of chopsticks to pick up dried beans, min to mod difficulty  Gripper set at level 4 sustained grip to pick up 1 inch blocks, min difficulty, drops - for sustained grip        HOME EXERCISE PROGRAM  08/18/21: Yellow Theraband Access Code: [email or web address],     OT Short Term Goals - Met all STGs 08/03/21      OT SHORT TERM GOAL #1   Title Patient will complete an HEP designed to improve coordination in LUE    Time 4    Period Weeks    Status Achieved   Target Date 09/01/21      OT SHORT TERM GOAL #2   Title Patient will demonstrate ability to heat and serve simple warm meal for he and his wife with intermittent support    Time 4    Period Weeks    Status Achieved   reported doing higher cooking last night 07/27/21, continues to report cooking independently 08/03/21     OT SHORT TERM GOAL #3   Title Patient will feed himself with appropriate use of utensils in 30% less time without increased spills/drops  Time 4    Period Weeks    Status Achieved   pt reports using chopsticks and the right utensils     OT SHORT TERM GOAL #4   Title Patient will report improved effectiveness and efficiency with brushing his teeth using dominant LUE    Time 4    Period Weeks    Status Achieved   pt reports doing this consistently 07/27/21, 08/03/21             OT Long Term Goals - check by 11/24/21      OT LONG TERM GOAL #1   Title Patient will complete Home Activity Program designed to improve his ability to use LUE as dominant side again.    Time 8    Period Weeks    Status Ongoing   Target Date 11/24/21     OT LONG TERM GOAL #2   Title Patient will play a round of golf (9 holes - par 3) and use prior right hand swing.     Time 8    Period Weeks    Status ACHIEVED     OT LONG TERM GOAL #3   Title Patient will  be able to fully swing a golf club at 75% power   Time 8    Period Weeks    Status ONGOING approx 70% of power per pt report     OT LONG TERM GOAL #4   Title Patient will demonstrate awareness of return to driving recommendations    Time 8    Period Weeks    Status Achieved pt cleared by MD and is now driving     OT LONG TERM GOAL #5   Title Patient will return to gardening - weeding, raking, hoeing with intermittent assistance    Time 8    Period Weeks    Status Ongoing pt reports doing some of it but still tires easily     Long Term Additional Goals   Additional Long Term Goals Yes      OT LONG TERM GOAL #6   Title Patient will prepare and cook full meal for he and his wife - includes food prep - peeling, chopping, as well as cooking, and clean up.    Time 8    Period Weeks    Status Achieved reports higher level cooking and prepping with no difficulty at home 08/11/21  OT LONG TERM GOAL #7 Title  #7 Patient will drink from a hot full mug of coffee or tea using his dominant left hand  Time 8   Period Weeks   Status Ongoing 11/24/21 still working on goal per pt report  OT [place] #8 Title #8 Patient will demonstrate ability to carry a plate of food and cold drink across a crowded room using BUE without spilling.  Time 8   Period Weeks   Status Ongoing 11/25/19 pt reports completing but with increased time and attention            Plan -      Clinical Impression Statement Pt is progressing with increased strength and coordination.   OT Occupational Profile and History Detailed Assessment- Review of Records and additional review of physical, cognitive, psychosocial history related to current functional performance    Occupational performance deficits (Please refer to evaluation for details): ADL's;IADL's;Leisure    Body Structure / Function / Physical Skills ADL;Decreased knowledge of use of DME;Balance;Dexterity;GMC;Tone;Body mechanics;UE functional  use;IADL;Coordination;Flexibility;Mobility;FMC    Rehab Potential Excellent  Clinical Decision Making Limited treatment options, no task modification necessary    Comorbidities Affecting Occupational Performance: May have comorbidities impacting occupational performance    Modification or Assistance to Complete Evaluation  No modification of tasks or assist necessary to complete eval    OT Frequency 2x / week x 2 weeks, then 1x/wk x 8 wks   OT Duration 8 weeks    OT Treatment/Interventions Self-care/ADL training;Balance training;Aquatic Therapy;Therapeutic activities;Therapeutic exercise;Neuromuscular education;Functional Mobility Training;Patient/family education;DME and/or AE instruction    Plan NMR - Functional use of LUE, consider upgrading HEP for proximal strengthening, continue to work towards new goals and revised goals   Consulted and Agree with Plan of Care Patient              Janki Dike, OT 09/27/2021, 11:07 AM  Theone Murdoch, OTR/L [phone] Phone: [phone] 11:07 AM 09/27/21

## 2021-09-29 ENCOUNTER — Encounter: Payer: Self-pay | Admitting: Physical Therapy

## 2021-09-29 ENCOUNTER — Ambulatory Visit: Payer: Medicare Other | Admitting: Physical Therapy

## 2021-09-29 DIAGNOSIS — M6281 Muscle weakness (generalized): Secondary | ICD-10-CM

## 2021-09-29 DIAGNOSIS — R278 Other lack of coordination: Secondary | ICD-10-CM

## 2021-09-29 DIAGNOSIS — R2681 Unsteadiness on feet: Secondary | ICD-10-CM

## 2021-09-29 DIAGNOSIS — R2689 Other abnormalities of gait and mobility: Secondary | ICD-10-CM

## 2021-09-29 DIAGNOSIS — I69352 Hemiplegia and hemiparesis following cerebral infarction affecting left dominant side: Secondary | ICD-10-CM

## 2021-09-29 NOTE — Therapy (Signed)
OUTPATIENT PHYSICAL THERAPY TREATMENT NOTE   Patient Name: TSUTOMU BARFOOT MRN: [id number] DOB:Mar 17, 1951, 71 y.o., male Today's Date: 09/29/2021  PCP: Josetta Huddle, MD REFERRING PROVIDER: Josetta Huddle, MD   PT End of Session - 09/29/21 0934     Visit Number 13    Number of Visits 18    Date for PT Re-Evaluation 11/24/21    Authorization Type Medicare & Aarp Supplement  Covered 100%  VL: Follows Medicare guidelines-10th visit PN    PT Start Time 0931    PT Stop Time 1017    PT Time Calculation (min) 46 min    Equipment Utilized During Treatment Gait belt    Activity Tolerance Patient tolerated treatment well    Behavior During Therapy WFL for tasks assessed/performed                Past Medical History:  Diagnosis Date   Allergy    allergic rhinitis   BPH (benign prostatic hyperplasia)    Elevated prostate specific antigen (PSA)    Hypertension    Plantar fasciitis of right foot    Pneumonia    Thrombocytopenia, unspecified ([place]) 07/26/2012   03/24/12  129,000!   History reviewed. No pertinent surgical history. 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  He had carpal tunnel surgery in early February 2023.  Discharged from inpatient rehab 07/17/2021.  PMH:  chronic thrombocytopenia, BPH, hyperlipidemia, HN, AKI  PRECAUTIONS: Fall  SUBJECTIVE: He has not been able to go to the gym this week due to preparing  for visitors, but he has been doing chores and walking a lot more.  PAIN:  Are you having pain? No   OBJECTIVE:    DIAGNOSTIC FINDINGS: MRI 07/02/2021 showed Acute perforator infarct at the right basal ganglia and corona radiata.   TODAY'S TREATMENT: Elliptical training x8 minutes starting forward pedaling at L5.0 x59mns regressed to L3.5 x235ms followed by retro-pedaling x4m47m L3.5 performed for BLE strengthening, ROM, and dynamic warmup.  Pt needs 2 brief standing breaks during task and min cuing to maintain upright posture during motion. Floor to standing transfers from butt-on-heels to tall kneel to half kneel to standing x10 w/ alt LE leading to promote glut strength and SLS; improved immediate standing balance from transfer tasks noted in prior sessions. Jump squats w/ blue theraband to engage glut med x12  Squats to EOM w/ RLE elevated on 6" step 2x8 using blue theraband for glut med engagement; extended rest between sets due to fatigue SLS w/ toe touch on opp LE on airex w/ 3kg rebounder x20  PATIENT EDUCATION: Education details: Rest this weekend and stick to walking program until guests leave then resume HEP.  Edu on rest and recovery. Person educated: Patient Education method: Explanation  Education comprehension: verbalized understanding     HOME EXERCISE PROGRAM: Access Code: [email or web address]: [email or web address] Date: 09/05/2021 Prepared by: MarElease Etiennexercises - Gastroc Stretch on Wall  - 1 x daily - 7 x weekly -  2 sets - 2 reps - 30 sec hold - Seated Ankle Dorsiflexion with Anchored Resistance  - 1 x daily - 7 x weekly - 3 sets - 10 reps - Eccentric Heel Lowering on Step  - 1 x daily - 7 x weekly - 3 sets - 10 reps - Forward Step Down  - 1 x daily - 7 x weekly - 3 sets - 10 reps - Squat Jumps  - 1 x daily - 7 x weekly - 3 sets - 10 reps - Kettlebell Deadlift  - 1 x daily - 7 x weekly - 3 sets - 10 reps - Single Leg Stance on Foam Pad  - 1 x  daily - 5 x weekly - 3 sets - 10 reps -encouraged pt to use toe touch with RLE when LLE in stance for appropriate challenge w/o requiring UE support - Standing with Feet Together on Foam Pad   - 1 x daily - 5 x weekly - 3 sets - 10 reps -added eyes closed for inc challenge same frequency - Romberg Stance Eyes Closed on Foam Pad  - 1 x daily - 5 x weekly - 3 sets - 10 reps -pt instructed to start with eyes open and use back of chair in front for added 3rd pt for balance as needed.   - Tandem Stance on Foam Pad with Eyes Closed  - 1 x daily - 5 x weekly - 3 sets - 10 reps *Pt encourage to vary length of hold time to match length of time he can hold position w/o LOB.  Demo'd appropriate corner setup at home for all standing balance tasks.    ASSESSMENT:   CLINICAL IMPRESSION:  Focus of skilled session on addressing medial collapse of bilateral knees and improved engagement of glut med with high level strength tasks.  Pt continues to be most challenged by activities while maintaining true glut engagement.  Will continue to address deficits present in coming sessions per POC.     OBJECTIVE IMPAIRMENTS Abnormal gait, decreased balance, decreased coordination, decreased knowledge of use of DME, difficulty walking, decreased ROM, decreased strength, hypomobility, and impaired flexibility.    ACTIVITY LIMITATIONS cleaning, community activity, driving, laundry, yard work, and hobbies: golfing and bowling .    PERSONAL FACTORS Age, Past/current experiences, and 3+ comorbidities:  chronic thrombocytopenia, BPH, hyperlipidemia, HN, AKI  are also affecting patient's functional outcome.      REHAB POTENTIAL: Excellent   CLINICAL DECISION MAKING: Stable/uncomplicated   EVALUATION COMPLEXITY: Low  NEW GOALS: SHORT TERM GOALS: Target date:  10/13/2021 Pt will demonstrate SLS of >/=30 seconds on LLE maintaining soft knee bend to demonstrate improved static balance and quad control. Baseline: Pt unable to  stand >2-3 seconds on uni LE Goal status: INITIAL  2.  Pt will demo improved left lateral hip flare during gait training to indicate improved glut strength. Baseline: left lateral hip flare present Goal status: INITIAL  3.  Pt will demonstrate improved pivot turns on BLE w/o LOB to improve fluidity and efficiency of functional mobility. Baseline: inc difficulty on LLE in stance Goal status: INITIAL  LONG TERM GOALS: Target date:  11/10/2021 Pt will report regular aerobic activity with advanced HEP maintained for strength, motor control, and endurance of BLE independently. Baseline: Has current HEP Goal status: INITIAL  2.  Pt will ambulate>500 feet on 2MWT to demonstrate improved endurance for functional tasks in home and community.  Baseline: 53' w/ SPC w/ quad tip; 340' supervision no AD  Goal status: INITIAL   3.  Pt will ambulate at least 1000 feet with LRAD at independent level of assist with safe speed and improved LLE placement to promote household and community access. Baseline: ataxia 385', scissoring of the LLE using SPC w/ quad tip; 1150' w/o AD supervision level 09/12/2021 Goal status: INITIAL  4.  Pt will improve FGA score to 28/30 in order to demonstrate improved balance and decreased fall risk. Baseline: 25/30 Goal status: INITIAL  5.  Pt will ambulate >300' w/o foot-up brace w/o toe catch for improved safety with functional mobility. Baseline: toe catch w/o foot-up brace Goal status: INITIAL   PLAN: PT FREQUENCY:  1x/wk   PT DURATION: 8 weeks   PLANNED INTERVENTIONS: Therapeutic exercises, Therapeutic activity, Neuromuscular re-education, Balance training, Gait training, Patient/Family education, Joint mobilization, Stair training, Vestibular training, Orthotic/Fit training, DME instructions, and Manual therapy   PLAN FOR NEXT SESSION: SLS.  Focus on dec speed of task and increased quality on movement.  Continue weighted squats on BOSU w/ improved quality.   Ambulate up grass hill to simulate golf environment.  SLS on airex>rebounder.  Single leg deadlift.   Heel taps from 6">8" step.  Continue use of leg press.  Continue retro/fwd/sideways gait training on TM- work on incline forward. Try squats on large tilt board with weighted ball and tandem weight shifting on tilt board. Incorporate bowling activity-bowling ball weighs 15#, dual task-maintain L knee control, SciFit for aerobic health; leg press holds for quad control and endurance.  Wall sits w/ forward lean>weighted ball  Bary Richard, PT, DPT 09/29/2021, 2:01 PM

## 2021-10-03 ENCOUNTER — Ambulatory Visit: Payer: PRIVATE HEALTH INSURANCE | Admitting: Physical Therapy

## 2021-10-04 ENCOUNTER — Encounter: Payer: Self-pay | Admitting: Occupational Therapy

## 2021-10-04 ENCOUNTER — Ambulatory Visit: Payer: Medicare Other | Admitting: Occupational Therapy

## 2021-10-04 DIAGNOSIS — R278 Other lack of coordination: Secondary | ICD-10-CM

## 2021-10-04 DIAGNOSIS — R2689 Other abnormalities of gait and mobility: Secondary | ICD-10-CM

## 2021-10-04 DIAGNOSIS — R2681 Unsteadiness on feet: Secondary | ICD-10-CM

## 2021-10-04 DIAGNOSIS — I69352 Hemiplegia and hemiparesis following cerebral infarction affecting left dominant side: Secondary | ICD-10-CM

## 2021-10-04 DIAGNOSIS — M6281 Muscle weakness (generalized): Secondary | ICD-10-CM

## 2021-10-04 NOTE — Therapy (Signed)
OUTPATIENT OCCUPATIONAL THERAPY TREATMENT NOTE   Patient Name: Eddie Calhoun MRN: [id number] DOB:[birth year], 71 y.o., male Today's Date: 10/04/2021  PCP: Josetta Huddle, MD REFERRING PROVIDER: Josetta Huddle, MD       OT End of Session - 10/04/21 1109     Visit Number 20    Number of Visits 25    Date for OT Re-Evaluation 11/24/21    Authorization Type Medicare and AARP supplement    Authorization - Number of Visits --   corrected count   Progress Note Due on Visit 24    OT Start Time 1107    OT Stop Time 1145    OT Time Calculation (min) 38 min    Activity Tolerance Patient tolerated treatment well    Behavior During Therapy [facility] for tasks assessed/performed                   Past Medical History:  Diagnosis Date   Allergy    allergic rhinitis   BPH (benign prostatic hyperplasia)    Elevated prostate specific antigen (PSA)    Hypertension    Plantar fasciitis of right foot    Pneumonia    Thrombocytopenia, unspecified ([place]) 07/26/2012   03/24/12  129,000!   History reviewed. No pertinent surgical history. 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  PAIN:  Are you having pain? No      OBJECTIVE:   TODAY'S TREATMENT:  10/04/21  Zoom ball x hula hoop with stepping in and out and weight shifts in hula hoop and working on zoom ball with min difficulty with  coordination for zoom ball and process of novel task. LOB x 1  Working on physical and cognitive simultaneously with left attention for ability to carry items. Pt worked on holding several different items with need for increased attention and coordination for maintaining hold and reducing spills. Pt had spills x 2 during task with holding cup of water, plate of rolling objects and cone with balancing ball. Pt was simultaneously stepping in and out of hula hoop and word finding for foods in ABC order. Pt with increased difficulty with added cog component of word finding.  Brewing technologist with reaching with LUE for shoulder endurance.     HOME EXERCISE PROGRAM  08/18/21: Yellow Theraband Access Code: [email or web address],     OT Short Term Goals - Met all STGs 08/03/21      OT SHORT TERM GOAL #1   Title Patient will complete an HEP designed to improve coordination in LUE    Time 4    Period Weeks    Status Achieved   Target Date 09/01/21      OT SHORT TERM GOAL #2   Title Patient will demonstrate ability to heat and serve simple warm meal for he and his wife with intermittent support    Time 4    Period Weeks    Status Achieved   reported  doing higher cooking last night 07/27/21, continues to report cooking independently 08/03/21     OT [place] #3   Title Patient will feed himself with appropriate use of utensils in 30% less time without increased spills/drops    Time 4    Period Weeks    Status Achieved   pt reports using chopsticks and the right utensils     OT SHORT TERM GOAL #4   Title Patient will report improved effectiveness and efficiency with brushing his teeth using dominant LUE    Time 4    Period Weeks    Status Achieved   pt reports doing this consistently 07/27/21, 08/03/21             OT Long Term Goals - check by 11/24/21      OT LONG TERM GOAL #1   Title Patient will complete Home Activity Program designed to improve his ability to use LUE as dominant side  again.    Time 8    Period Weeks    Status Ongoing   Target Date 11/24/21     OT LONG TERM GOAL #2   Title Patient will play a round of golf (9 holes - par 3) and use prior right hand swing.     Time 8    Period Weeks    Status ACHIEVED     OT LONG TERM GOAL #3   Title Patient will be able to fully swing a golf club at 75% power   Time 8    Period Weeks    Status ONGOING approx 70% of power per pt report     OT LONG TERM GOAL #4   Title Patient will demonstrate awareness of return to driving recommendations    Time 8    Period Weeks    Status Achieved pt cleared by MD and is now driving     OT LONG TERM GOAL #5   Title Patient will return to gardening - weeding, raking, hoeing with intermittent assistance    Time 8    Period Weeks    Status Ongoing pt reports doing some of it but still tires easily     Long Term Additional Goals   Additional Long Term Goals Yes      OT LONG TERM GOAL #6   Title Patient will prepare and cook full meal for he and his wife - includes food prep - peeling, chopping, as well as cooking, and clean up.    Time 8    Period Weeks    Status Achieved reports higher level cooking and prepping with no difficulty at home 08/11/21  OT LONG TERM GOAL #7 Title  #7 Patient will drink from a hot full mug of coffee or tea using his dominant left hand  Time 8   Period Weeks   Status Ongoing 11/24/21 still working on goal per pt report  OT [place] #8 Title #8 Patient will demonstrate ability to carry a plate of food and cold drink across a crowded room using BUE without spilling.  Time 8   Period Weeks   Status Ongoing 11/25/19 pt reports completing but with increased time and attention            Plan -      Clinical Impression Statement Pt is progressing with increased strength and coordination.   OT Occupational Profile and History Detailed Assessment- Review of Records and additional review of physical, cognitive, psychosocial history  related to  current functional performance    Occupational performance deficits (Please refer to evaluation for details): ADL's;IADL's;Leisure    Body Structure / Function / Physical Skills ADL;Decreased knowledge of use of DME;Balance;Dexterity;GMC;Tone;Body mechanics;UE functional use;IADL;Coordination;Flexibility;Mobility;FMC    Rehab Potential Excellent    Clinical Decision Making Limited treatment options, no task modification necessary    Comorbidities Affecting Occupational Performance: May have comorbidities impacting occupational performance    Modification or Assistance to Complete Evaluation  No modification of tasks or assist necessary to complete eval    OT Frequency 2x / week x 2 weeks, then 1x/wk x 8 wks   OT Duration 8 weeks    OT Treatment/Interventions Self-care/ADL training;Balance training;Aquatic Therapy;Therapeutic activities;Therapeutic exercise;Neuromuscular education;Functional Mobility Training;Patient/family education;DME and/or AE instruction    Plan Continue to work in shoulder endurance and dual tasking with eft attention for carrying objects   Consulted and Agree with Plan of Care Patient              Zachery Conch, OT 10/04/2021, 11:10 AM

## 2021-10-06 ENCOUNTER — Encounter: Payer: PRIVATE HEALTH INSURANCE | Admitting: Occupational Therapy

## 2021-10-06 ENCOUNTER — Ambulatory Visit: Payer: PRIVATE HEALTH INSURANCE | Admitting: Physical Therapy

## 2021-10-11 ENCOUNTER — Encounter: Payer: PRIVATE HEALTH INSURANCE | Admitting: Occupational Therapy

## 2021-10-12 ENCOUNTER — Other Ambulatory Visit: Payer: Self-pay

## 2021-10-12 NOTE — Patient Outreach (Signed)
[facility]) Care Management  10/12/2021  Eddie Calhoun 07-Dec-1950 [id number]   First telephone outreach attempt to obtain mRS. No answer. Left message for returned call.  Philmore Pali [facility] Management Assistant [phone]

## 2021-10-13 ENCOUNTER — Encounter: Payer: Self-pay | Admitting: Physical Therapy

## 2021-10-13 ENCOUNTER — Other Ambulatory Visit: Payer: Self-pay

## 2021-10-13 ENCOUNTER — Ambulatory Visit: Payer: Medicare Other | Attending: Physical Medicine and Rehabilitation | Admitting: Physical Therapy

## 2021-10-13 ENCOUNTER — Encounter: Payer: Self-pay | Admitting: Occupational Therapy

## 2021-10-13 ENCOUNTER — Ambulatory Visit: Payer: Medicare Other | Admitting: Occupational Therapy

## 2021-10-13 DIAGNOSIS — R278 Other lack of coordination: Secondary | ICD-10-CM

## 2021-10-13 DIAGNOSIS — M6281 Muscle weakness (generalized): Secondary | ICD-10-CM | POA: Insufficient documentation

## 2021-10-13 DIAGNOSIS — I69352 Hemiplegia and hemiparesis following cerebral infarction affecting left dominant side: Secondary | ICD-10-CM | POA: Diagnosis present

## 2021-10-13 DIAGNOSIS — R2689 Other abnormalities of gait and mobility: Secondary | ICD-10-CM | POA: Insufficient documentation

## 2021-10-13 DIAGNOSIS — R2681 Unsteadiness on feet: Secondary | ICD-10-CM | POA: Diagnosis present

## 2021-10-13 NOTE — Therapy (Signed)
OUTPATIENT OCCUPATIONAL THERAPY TREATMENT NOTE & DISCHARGE   Patient Name: Eddie Calhoun MRN: [id number] DOB:Jul 26, 1950, 71 y.o., male Today's Date: 10/13/2021  PCP: Josetta Huddle, MD REFERRING PROVIDER: Josetta Huddle, MD       OT End of Session - 10/13/21 0931     Visit Number 21    Number of Visits 25    Date for OT Re-Evaluation 11/24/21    Authorization Type Medicare and AARP supplement    Authorization - Number of Visits --   corrected count   Progress Note Due on Visit 24    OT Start Time 0930    OT Stop Time 1015    OT Time Calculation (min) 45 min    Activity Tolerance Patient tolerated treatment well    Behavior During Therapy [facility] for tasks assessed/performed                   Past Medical History:  Diagnosis Date   Allergy    allergic rhinitis   BPH (benign prostatic hyperplasia)    Elevated prostate specific antigen (PSA)    Hypertension    Plantar fasciitis of right foot    Pneumonia    Thrombocytopenia, unspecified ([place]) 07/26/2012   03/24/12  129,000!   History reviewed. No pertinent surgical history. 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 No      OBJECTIVE:   TODAY'S TREATMENT:  10/13/21  Motor coordination activities with tennis balls with catching and flipping hand, juggling 2 balls, throwing ball at target at mirror and bouncing and catching with min difficulty.  Encouraged to continue practicing at home.   OCCUPATIONAL THERAPY DISCHARGE SUMMARY  Visits from Start of Care: 21  Current functional level related to goals / functional outcomes: Pt has improved significantly with overall functional use of LUE, range of motion and strength    Remaining deficits: Some deficits with overall power, inattention and gross motor coordination   Education / Equipment: HEPs   Patient agrees to discharge. Patient goals were met. Patient is being discharged due to meeting the stated rehab goals.Marland Kitchen        HOME EXERCISE PROGRAM  08/18/21: Yellow Theraband Access Code: [email or web address],     OT Short Term Goals - Met all STGs 08/03/21      OT SHORT TERM GOAL #1   Title Patient will complete an HEP designed to improve coordination in LUE    Time 4    Period Weeks    Status Achieved   Target Date 09/01/21      OT SHORT TERM GOAL #2   Title Patient will demonstrate ability to heat and serve simple warm meal for he and his wife with intermittent support    Time 4    Period Weeks    Status Achieved   reported doing higher cooking last night 07/27/21, continues to report cooking independently 08/03/21     OT SHORT TERM GOAL #3   Title Patient  will feed himself with appropriate use of utensils in 30% less time without increased spills/drops    Time 4    Period Weeks    Status Achieved   pt reports using chopsticks and the right utensils     OT SHORT TERM GOAL #4   Title Patient will report improved effectiveness and efficiency with brushing his teeth using dominant LUE    Time 4    Period Weeks    Status Achieved   pt reports doing this consistently 07/27/21, 08/03/21             OT Long Term Goals - check by 11/24/21      OT LONG TERM GOAL #1   Title Patient will complete Home Activity Program designed to improve his ability to use LUE as dominant side again.    Time 8    Period Weeks    Status Achieved   Target Date 11/24/21     OT LONG TERM GOAL  #2   Title Patient will play a round of golf (9 holes - par 3) and use prior right hand swing.     Time 8    Period Weeks    Status ACHIEVED     OT LONG TERM GOAL #3   Title Patient will be able to fully swing a golf club at 75% power   Time 8    Period Weeks    Status Achieved approx 70% of power per pt report     OT LONG TERM GOAL #4   Title Patient will demonstrate awareness of return to driving recommendations    Time 8    Period Weeks    Status Achieved pt cleared by MD and is now driving     OT LONG TERM GOAL #5   Title Patient will return to gardening - weeding, raking, hoeing with intermittent assistance    Time 8    Period Weeks    Status Achieved      Long Term Additional Goals   Additional Long Term Goals Yes      OT LONG TERM GOAL #6   Title Patient will prepare and cook full meal for he and his wife - includes food prep - peeling, chopping, as well as cooking, and clean up.    Time 8    Period Weeks    Status Achieved reports higher level cooking and prepping with no difficulty at home 08/11/21  OT LONG TERM GOAL #7 Title  #7 Patient will drink from a hot full mug of coffee or tea using his dominant left hand  Time 8   Period Weeks   Status Achieved  completing per patient  OT LONG TERM GOAL #8 Title #8 Patient will demonstrate ability to carry a plate of food and cold drink across a crowded room using BUE without spilling.  Time 8   Period Weeks   Status Achieved  pt reports completing but with increased time and attention            Plan -      Clinical Impression Statement OT discharge today.   OT Occupational Profile and History Detailed Assessment- Review of Records and additional review of physical, cognitive, psychosocial history related to current functional performance    Occupational performance deficits (Please refer to evaluation for details): ADL's;IADL's;Leisure    Body Structure / Function / Physical Skills ADL;Decreased knowledge of use  of DME;Balance;Dexterity;GMC;Tone;Body mechanics;UE functional use;IADL;Coordination;Flexibility;Mobility;FMC    Rehab Potential Excellent  Clinical Decision Making Limited treatment options, no task modification necessary    Comorbidities Affecting Occupational Performance: May have comorbidities impacting occupational performance    Modification or Assistance to Complete Evaluation  No modification of tasks or assist necessary to complete eval    OT Frequency 2x / week x 2 weeks, then 1x/wk x 8 wks   OT Duration 8 weeks    OT Treatment/Interventions Self-care/ADL training;Balance training;Aquatic Therapy;Therapeutic activities;Therapeutic exercise;Neuromuscular education;Functional Mobility Training;Patient/family education;DME and/or AE instruction    Plan OT d/c   Consulted and Agree with Plan of Care Patient              Zachery Conch, OT 10/13/2021, 9:32 AM

## 2021-10-13 NOTE — Patient Instructions (Signed)
Access Code: [email or web address] URL: [email or web address] Date: 09/05/2021 Prepared by: [place] on Wall  - 1 x daily - 7 x weekly - 2 sets - 2 reps - 30 sec hold - Seated Ankle Dorsiflexion with Anchored Resistance  - 1 x daily - 7 x weekly - 3 sets - 10 reps - Eccentric Heel Lowering on Step  - 1 x daily - 7 x weekly - 3 sets - 10 reps - Forward Step Down  - 1 x daily - 7 x weekly - 3 sets - 10 reps - Squat Jumps  - 1 x daily - 7 x weekly - 3 sets - 10 reps - Kettlebell Deadlift  - 1 x daily - 7 x weekly - 3 sets - 10 reps - Single Leg Stance on Foam Pad  - 1 x daily - 5 x weekly - 3 sets - 10 reps -encouraged pt to use toe touch with RLE when LLE in stance for appropriate challenge w/o requiring UE support - Standing with Feet Together on Foam Pad   - 1 x daily - 5 x weekly - 3 sets - 10 reps -added eyes closed for inc challenge same frequency - Romberg Stance Eyes Closed on Foam Pad  - 1 x daily - 5 x weekly - 3 sets - 10 reps -pt instructed to start with eyes open and use back of chair in front for added 3rd pt for balance as needed.   - Tandem Stance on Foam Pad with Eyes Closed  - 1 x daily - 5 x weekly - 3 sets - 10 reps *Pt encourage to vary length of hold time to match length of time he can hold position w/o LOB.  Demo'd appropriate corner setup at home for all standing balance tasks.   - Mountain Climber on Counter  - 1 x daily - 5 x weekly - 3 sets - 10 reps

## 2021-10-13 NOTE — Patient Outreach (Signed)
[facility]) Care Management  10/13/2021  Eddie Calhoun Jul 14, 1950 [id number]   Telephone outreach to patient to obtain mRS was successfully completed. MRS=1  [place] [facility] Management Assistant [phone]

## 2021-10-13 NOTE — Therapy (Addendum)
OUTPATIENT PHYSICAL THERAPY TREATMENT NOTE   Patient Name: Eddie Calhoun MRN: [id number] DOB:1951/02/06, 71 y.o., male Today's Date: 10/13/2021  PCP: Josetta Huddle, MD (Switching to Scarlette Calico, MD on 12/12/2021) REFERRING PROVIDER: Josetta Huddle, MD   PT End of Session - 10/13/21 1010     Visit Number 14    Number of Visits 21    Date for PT Re-Evaluation 11/24/21    Authorization Type Medicare & Aarp Supplement  Covered 100%  VL: Follows Medicare guidelines-10th visit PN    PT Start Time 1008    PT Stop Time 1100    PT Time Calculation (min) 52 min    Equipment Utilized During Treatment Gait belt    Activity Tolerance Patient tolerated treatment well    Behavior During Therapy WFL for tasks assessed/performed                Past Medical History:  Diagnosis Date   Allergy    allergic rhinitis   BPH (benign prostatic hyperplasia)    Elevated prostate specific antigen (PSA)    Hypertension    Plantar fasciitis of right foot    Pneumonia    Thrombocytopenia, unspecified ([place]) 07/26/2012   03/24/12  129,000!   History reviewed. No pertinent surgical history. 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  He had carpal tunnel surgery in early February 2023.  Discharged from inpatient rehab 07/17/2021.  PMH:  chronic thrombocytopenia, BPH, hyperlipidemia, HN, AKI  PRECAUTIONS: Fall  SUBJECTIVE: Continues to be  active without recent falls.  PAIN:  Are you having pain? No   OBJECTIVE:    DIAGNOSTIC FINDINGS: MRI 07/02/2021 showed Acute perforator infarct at the right basal ganglia and corona radiata.   TODAY'S TREATMENT: Mountain climbers 2x15 at [employer] cuing for form and engagement of core, gluts, and hip flexors as pt is lacking in hip flexor engagement SLS on foam-focus on form and symmetrical hip engagement>rebounder w/ 3.3kg weighted ball, pt still performs R toe touch when in left stance Squats on firm BOSU x15>15# kettlebell squats x15 w/ improved symmetrical form, cues for weight close to body Pivot turns x8 at countertop and on open floor to promote improved mechanics and prevent left heel whip secondary to decreased glut engagement of swing limb  PATIENT EDUCATION: Education details: Additions to HEP.  Extensive conversation on chronicity of stroke deficits and progression of form, neuroplasticity and gait mechanics.  Discussed quality of movement and pacing of activity to promote strength in kinetic chain.  Discussed increased repetitions for endurance and focusing on form and quality of repetitions.   Demonstrated how to palpate for trendelenburg during gait and NMR activities so pt can self-correct at home. Person educated: Patient Education method: Explanation  Education comprehension: verbalized understanding     HOME EXERCISE PROGRAM: Access Code: [email or web address] URL: [email or web address] Date: 09/05/2021 Prepared by: Elease Etienne  Exercises - Gastroc Stretch on Wall  - 1 x daily - 7 x weekly - 2 sets - 2 reps -  30 sec hold - Seated Ankle Dorsiflexion with Anchored Resistance  - 1 x daily - 7 x weekly - 3 sets - 10 reps - Eccentric Heel Lowering on Step  - 1 x daily - 7 x weekly - 3 sets - 10 reps - Forward Step Down  - 1 x daily - 7 x weekly - 3 sets - 10 reps - Squat Jumps  - 1 x daily - 7 x weekly - 3 sets - 10 reps - Kettlebell Deadlift  - 1 x  daily - 7 x weekly - 3 sets - 10 reps - Single Leg Stance on Foam Pad  - 1 x daily - 5 x weekly - 3 sets - 10 reps -encouraged pt to use toe touch with RLE when LLE in stance for appropriate challenge w/o requiring UE support - Standing with Feet Together on Foam Pad   - 1 x daily - 5 x weekly - 3 sets - 10 reps -added eyes closed for inc challenge same frequency - Romberg Stance Eyes Closed on Foam Pad  - 1 x daily - 5 x weekly - 3 sets - 10 reps -pt instructed to start with eyes open and use back of chair in front for added 3rd pt for balance as needed.   - Tandem Stance on Foam Pad with Eyes Closed  - 1 x daily - 5 x weekly - 3 sets - 10 reps *Pt encourage to vary length of hold time to match length of time he can hold position w/o LOB.  Demo'd appropriate corner setup at home for all standing balance tasks.   - Mountain Climber on Counter  - 1 x daily - 5 x weekly - 3 sets - 10 reps  ASSESSMENT:   CLINICAL IMPRESSION:  Focus of skilled session on kinetic chain form to prevent medial heel whip in swing phase of turns, medial unlocking of the left knee in stance, and trendelenburg that persists on the left lower extremity.  He continues to progress his activity tolerance outside the clinic with education on quality of repetitions to promote progression during functional plateau where he has returned to high level activity, but not as well as he would like.  Will continue to address deficits per POC to improve quality of movement and progress towards goals.     OBJECTIVE IMPAIRMENTS Abnormal gait, decreased balance, decreased coordination, decreased knowledge of use of DME, difficulty walking, decreased ROM, decreased strength, hypomobility, and impaired flexibility.    ACTIVITY LIMITATIONS cleaning, community activity, driving, laundry, yard work, and hobbies: golfing and bowling .    PERSONAL FACTORS Age, Past/current experiences, and 3+ comorbidities:  chronic thrombocytopenia, BPH,  hyperlipidemia, HN, AKI  are also affecting patient's functional outcome.      REHAB POTENTIAL: Excellent   CLINICAL DECISION MAKING: Stable/uncomplicated   EVALUATION COMPLEXITY: Low  NEW GOALS: SHORT TERM GOALS: Target date:  10/13/2021 Pt will demonstrate SLS of >/=30 seconds on LLE maintaining soft knee bend to demonstrate improved static balance and quad control. Baseline: Pt unable to stand >2-3 seconds on uni LE Goal status: INITIAL  2.  Pt will demo improved left lateral hip flare during gait training to indicate improved glut strength. Baseline: left lateral hip flare present Goal status: INITIAL  3.  Pt will demonstrate improved pivot turns on BLE w/o LOB to improve fluidity and efficiency of functional mobility. Baseline: inc difficulty on LLE in stance Goal status: INITIAL  LONG TERM GOALS: Target date:  11/10/2021 Pt  will report regular aerobic activity with advanced HEP maintained for strength, motor control, and endurance of BLE independently. Baseline: Has current HEP Goal status: INITIAL  2.  Pt will ambulate>500 feet on 2MWT to demonstrate improved endurance for functional tasks in home and community.  Baseline: 47' w/ SPC w/ quad tip; 340' supervision no AD Goal status: INITIAL   3.  Pt will ambulate at least 1000 feet with LRAD at independent level of assist with safe speed and improved LLE placement to promote household and community access. Baseline: ataxia 385', scissoring of the LLE using SPC w/ quad tip; 1150' w/o AD supervision level 09/12/2021 Goal status: INITIAL  4.  Pt will improve FGA score to 28/30 in order to demonstrate improved balance and decreased fall risk. Baseline: 25/30 Goal status: INITIAL  5.  Pt will ambulate >300' w/o foot-up brace w/o toe catch for improved safety with functional mobility. Baseline: toe catch w/o foot-up brace Goal status: INITIAL   PLAN: PT FREQUENCY:  1x/wk   PT DURATION: 8 weeks   PLANNED INTERVENTIONS:  Therapeutic exercises, Therapeutic activity, Neuromuscular re-education, Balance training, Gait training, Patient/Family education, Joint mobilization, Stair training, Vestibular training, Orthotic/Fit training, DME instructions, and Manual therapy   PLAN FOR NEXT SESSION: Missed STGs due last session-assess and update baselines!  Left hip flexor engagement.  Continue retro/fwd/sideways gait training on TM- work on incline forward.  SLS-airex rebounder.  Pivot turns to prevent medial heel whip on LLE.  Continue weighted squats on BOSU w/ improved quality.  Single leg squats, SLS w/ knee bend.  Ambulate up grass hill to simulate golf environment.  SLS on airex>rebounder.  Single leg deadlift.  Continue use of leg press.   Dual task-maintain L knee control, Wall sits w/ forward lean>weighted ball  Bary Richard, PT, DPT 10/13/2021, 5:26 PM

## 2021-10-18 ENCOUNTER — Encounter: Payer: Self-pay | Admitting: Physical Therapy

## 2021-10-18 ENCOUNTER — Ambulatory Visit: Payer: Medicare Other | Admitting: Physical Therapy

## 2021-10-18 ENCOUNTER — Ambulatory Visit: Payer: Medicare Other | Admitting: Occupational Therapy

## 2021-10-18 DIAGNOSIS — I69352 Hemiplegia and hemiparesis following cerebral infarction affecting left dominant side: Secondary | ICD-10-CM

## 2021-10-18 DIAGNOSIS — R278 Other lack of coordination: Secondary | ICD-10-CM | POA: Diagnosis not present

## 2021-10-18 DIAGNOSIS — R2689 Other abnormalities of gait and mobility: Secondary | ICD-10-CM

## 2021-10-18 DIAGNOSIS — R2681 Unsteadiness on feet: Secondary | ICD-10-CM

## 2021-10-18 DIAGNOSIS — M6281 Muscle weakness (generalized): Secondary | ICD-10-CM

## 2021-10-18 NOTE — Therapy (Signed)
OUTPATIENT PHYSICAL THERAPY TREATMENT NOTE   Patient Name: Eddie Calhoun MRN: [id number] DOB:1950/10/06, 71 y.o., male Today's Date: 10/18/2021  PCP: Josetta Huddle, MD (Switching to Scarlette Calico, MD on 12/12/2021) REFERRING PROVIDER: Josetta Huddle, MD   PT End of Session - 10/18/21 1236     Visit Number 15    Number of Visits 21    Date for PT Re-Evaluation 11/24/21    Authorization Type Medicare & Aarp Supplement  Covered 100%  VL: Follows Medicare guidelines-10th visit PN    PT Start Time 1232    PT Stop Time 1317    PT Time Calculation (min) 45 min    Equipment Utilized During Treatment Gait belt    Activity Tolerance Patient tolerated treatment well    Behavior During Therapy [facility] for tasks assessed/performed                Past Medical History:  Diagnosis Date   Allergy    allergic rhinitis   BPH (benign prostatic hyperplasia)    Elevated prostate specific antigen (PSA)    Hypertension    Plantar fasciitis of right foot    Pneumonia    Thrombocytopenia, unspecified ([place]) 07/26/2012   03/24/12  129,000!   History reviewed. No pertinent surgical history. 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  He had carpal tunnel surgery in early February 2023.  Discharged from inpatient rehab 07/17/2021.  PMH:  chronic thrombocytopenia, BPH, hyperlipidemia, HN, AKI  PRECAUTIONS: Fall  SUBJECTIVE: He is working on  pivot turns and quality of movement at home.  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  His bowling game is improving, but he has not had opportunities to gold due to weather.  PAIN:  Are you having pain? No  OBJECTIVE:    DIAGNOSTIC FINDINGS: MRI 07/02/2021 showed Acute perforator infarct at the right basal ganglia and corona radiata.   TODAY'S TREATMENT: STG assessment: -SLS 5.5 sec on LLE and 6 sec on RLE; pt attempts SLS several bouts following assessment w/o UE support on firm surface GAIT: Gait pattern: step through pattern, genu recurvatum- Left, and trendelenburg Distance walked: 345' Assistive device utilized: None Level of assistance: Complete Independence Comments: Pt demonstrates left trendelenburg with mild correction and maintenance with min cuing.  He has intermittent left foot flat on initial contact. -Assessed pivot turns w/ pt demonstrating improved lateral hip stability and narrowed BOS when advancing swing limb around body.  Able to perform several bouts left and right without UE support for stability. -In supine:  bridges x5 w/ LLE on green airdisc > single leg bridges w/ LLE elevated on airdisc x6, cued for core engagement w/ inc instability noted >single leg bridges alt LE x8 each leg on mat surface > BLE on soft side of BOSU bridges x10; tried single leg bridges on soft BOSU with improved stability; instructed pt to perform unilateral bridging as part of  HEP.   PATIENT EDUCATION: Education details: Continue HEP and addition to HEP. Person educated: Patient Education method: Explanation  Education comprehension: verbalized understanding     HOME EXERCISE PROGRAM: Access Code: [email or web address] URL: [email or web address] Date: 09/05/2021 Prepared by: [place] on Wall  - 1 x daily - 7 x weekly - 2 sets - 2 reps - 30 sec hold - Seated Ankle  Dorsiflexion with Anchored Resistance  - 1 x daily - 7 x weekly - 3 sets - 10 reps - Eccentric Heel Lowering on Step  - 1 x daily - 7 x weekly - 3 sets - 10 reps - Forward Step Down  - 1 x daily - 7 x weekly - 3 sets - 10 reps - Squat Jumps  - 1 x daily - 7 x weekly - 3 sets - 10 reps - Kettlebell Deadlift  - 1 x daily - 7 x weekly - 3 sets - 10 reps - Single Leg Stance on Foam Pad  - 1 x daily - 5 x weekly - 3 sets - 10 reps -encouraged pt to use toe touch with RLE when LLE in stance for appropriate challenge w/o requiring UE support - Standing with Feet Together on Foam Pad   - 1 x daily - 5 x weekly - 3 sets - 10 reps -added eyes closed for inc challenge same frequency - Romberg Stance Eyes Closed on Foam Pad  - 1 x daily - 5 x weekly - 3 sets - 10 reps -pt instructed to start with eyes open and use back of chair in front for added 3rd pt for balance as needed.   - Tandem Stance on Foam Pad with Eyes Closed  - 1 x daily - 5 x weekly - 3 sets - 10 reps *Pt encourage to vary length of hold time to match length of time he can hold position w/o LOB.  Demo'd appropriate corner setup at home for all standing balance tasks.   - Mountain Climber on Counter  - 1 x daily - 5 x weekly - 3 sets - 10 reps - Single Leg Bridge  - 1 x daily - 5 x weekly - 3 sets - 10 reps  ASSESSMENT:   CLINICAL IMPRESSION:  Pt is progressing towards long-term goals upon assessment of short term goals today with most ongoing at this time.  He is continuing to work on details of movement to enhance overall quality of gait and functional mobility.  Remains limited by need for increased cognitive load when engaging hip musculature and quads throughout.  Pivot turns are progressing with improved hip engagement, but during gait pt continues to demo trendelenburg and genu recurvatum.  Will continue to address deficits ongoing with POC.     OBJECTIVE IMPAIRMENTS Abnormal gait, decreased balance, decreased coordination, decreased  knowledge of use of DME, difficulty walking, decreased ROM, decreased strength, hypomobility, and impaired flexibility.    ACTIVITY LIMITATIONS cleaning, community activity, driving, laundry, yard work, and hobbies: golfing and bowling .    PERSONAL FACTORS Age, Past/current experiences, and 3+ comorbidities:  chronic thrombocytopenia, BPH, hyperlipidemia, HN, AKI  are also affecting patient's functional outcome.      REHAB POTENTIAL: Excellent   CLINICAL DECISION MAKING: Stable/uncomplicated   EVALUATION COMPLEXITY: Low  NEW GOALS: SHORT TERM GOALS: Target date:  10/13/2021 Pt will demonstrate SLS of >/=30 seconds on LLE maintaining soft knee bend to demonstrate improved static balance and quad control. Baseline: Pt unable to stand >  2-3 seconds on uni LE; 5.5 sec on LLE Goal status: IN PROGRESS  2.  Pt will demo improved left lateral hip flare during gait training to indicate improved glut strength. Baseline: left lateral hip flare present; lateral hip flare still present when pt not thinking about engaging hip Goal status: IN PROGRESS  3.  Pt will demonstrate improved pivot turns on BLE w/o LOB to improve fluidity and efficiency of functional mobility. Baseline: inc difficulty on LLE in stance; improved pivot turns L and R w/ improved hip muscle engagement Goal status: MET  LONG TERM GOALS: Target date:  11/10/2021 Pt will report regular aerobic activity with advanced HEP maintained for strength, motor control, and endurance of BLE independently. Baseline: Has current HEP Goal status: INITIAL  2.  Pt will ambulate>500 feet on 2MWT to demonstrate improved endurance for functional tasks in home and community.  Baseline: 55' w/ SPC w/ quad tip; 340' supervision no AD Goal status: INITIAL   3.  Pt will ambulate at least 1000 feet with LRAD at independent level of assist with safe speed and improved LLE placement to promote household and community access. Baseline: ataxia 385',  scissoring of the LLE using SPC w/ quad tip; 1150' w/o AD supervision level 09/12/2021 Goal status: INITIAL  4.  Pt will improve FGA score to 28/30 in order to demonstrate improved balance and decreased fall risk. Baseline: 25/30 Goal status: INITIAL  5.  Pt will ambulate >300' w/o foot-up brace w/o toe catch for improved safety with functional mobility. Baseline: toe catch w/o foot-up brace Goal status: INITIAL   PLAN: PT FREQUENCY:  1x/wk   PT DURATION: 8 weeks   PLANNED INTERVENTIONS: Therapeutic exercises, Therapeutic activity, Neuromuscular re-education, Balance training, Gait training, Patient/Family education, Joint mobilization, Stair training, Vestibular training, Orthotic/Fit training, DME instructions, and Manual therapy   PLAN FOR NEXT SESSION: Pivot turns w/ weight on ankle.  SLS w/ lateral reach.  Squat > kickback and abd w/ ankle weight.  Circuit training.  Left hip flexor engagement.  Continue single leg bridging w/ compliant surface. Continue retro/fwd/sideways gait training on TM- work on incline forward.  SLS-airex rebounder.  Pivot turns to prevent medial heel whip on LLE.  Continue weighted squats on BOSU w/ improved quality.  Single leg squats, SLS w/ knee bend.  Ambulate up grass hill to simulate golf environment.  SLS on airex>rebounder.  Single leg deadlift.  Continue use of leg press.   Dual task-maintain L knee control, Wall sits w/ forward lean>weighted ball  Bary Richard, PT, DPT 10/18/2021, 1:46 PM

## 2021-10-18 NOTE — Patient Instructions (Signed)
Access Code: [email or web address] URL: [email or web address] Date: 10/18/2021 Prepared by: [place] on Wall  - 1 x daily - 7 x weekly - 2 sets - 2 reps - 30 sec hold - Seated Ankle Dorsiflexion with Anchored Resistance  - 1 x daily - 7 x weekly - 3 sets - 10 reps - Eccentric Heel Lowering on Step  - 1 x daily - 7 x weekly - 3 sets - 10 reps - Forward Step Down  - 1 x daily - 7 x weekly - 3 sets - 10 reps - Squat Jumps  - 1 x daily - 7 x weekly - 3 sets - 10 reps - Kettlebell Deadlift  - 1 x daily - 7 x weekly - 3 sets - 10 reps - Single Leg Stance on Foam Pad  - 1 x daily - 5 x weekly - 3 sets - 10 reps - Standing with Feet Together on Foam Pad (BKA)  - 1 x daily - 5 x weekly - 3 sets - 10 reps - Romberg Stance Eyes Closed on Foam Pad  - 1 x daily - 5 x weekly - 3 sets - 10 reps - Tandem Stance on Foam Pad with Eyes Closed  - 1 x daily - 5 x weekly - 3 sets - 10 reps - [employer] on Counter  - 1 x daily - 5 x weekly - 3 sets - 10 reps - Single Leg Bridge  - 1 x daily - 5 x weekly - 3 sets - 10 reps

## 2021-10-23 ENCOUNTER — Telehealth: Payer: Self-pay | Admitting: Neurology

## 2021-10-23 NOTE — Telephone Encounter (Signed)
Rescheduled 8/21 appt with pt over the phone - MD out

## 2021-10-25 ENCOUNTER — Encounter: Payer: PRIVATE HEALTH INSURANCE | Admitting: Occupational Therapy

## 2021-10-27 ENCOUNTER — Ambulatory Visit: Payer: Medicare Other | Admitting: Physical Therapy

## 2021-10-27 DIAGNOSIS — R278 Other lack of coordination: Secondary | ICD-10-CM | POA: Diagnosis not present

## 2021-10-27 DIAGNOSIS — I69352 Hemiplegia and hemiparesis following cerebral infarction affecting left dominant side: Secondary | ICD-10-CM

## 2021-10-27 DIAGNOSIS — R2681 Unsteadiness on feet: Secondary | ICD-10-CM

## 2021-10-30 ENCOUNTER — Telehealth: Payer: Self-pay | Admitting: *Deleted

## 2021-10-30 ENCOUNTER — Encounter: Payer: Self-pay | Admitting: Cardiology

## 2021-10-30 DIAGNOSIS — I639 Cerebral infarction, unspecified: Secondary | ICD-10-CM

## 2021-10-31 ENCOUNTER — Other Ambulatory Visit: Payer: Self-pay | Admitting: *Deleted

## 2021-10-31 DIAGNOSIS — I639 Cerebral infarction, unspecified: Secondary | ICD-10-CM

## 2021-11-01 ENCOUNTER — Encounter: Payer: Self-pay | Admitting: Physical Therapy

## 2021-11-01 ENCOUNTER — Ambulatory Visit: Payer: Medicare Other | Admitting: Physical Therapy

## 2021-11-01 ENCOUNTER — Encounter: Payer: PRIVATE HEALTH INSURANCE | Admitting: Occupational Therapy

## 2021-11-01 ENCOUNTER — Telehealth: Payer: Self-pay | Admitting: Neurology

## 2021-11-01 DIAGNOSIS — M6281 Muscle weakness (generalized): Secondary | ICD-10-CM

## 2021-11-01 DIAGNOSIS — R2681 Unsteadiness on feet: Secondary | ICD-10-CM

## 2021-11-01 DIAGNOSIS — I69352 Hemiplegia and hemiparesis following cerebral infarction affecting left dominant side: Secondary | ICD-10-CM

## 2021-11-01 DIAGNOSIS — R278 Other lack of coordination: Secondary | ICD-10-CM | POA: Diagnosis not present

## 2021-11-01 DIAGNOSIS — R2689 Other abnormalities of gait and mobility: Secondary | ICD-10-CM

## 2021-11-01 NOTE — Addendum Note (Signed)
Addended by: Desmond Lope on: 11/01/2021 06:25 PM   Modules accepted: Orders

## 2021-11-01 NOTE — Telephone Encounter (Signed)
Referral for Cardiology sent to [place], [place] [phone].

## 2021-11-01 NOTE — Telephone Encounter (Signed)
Referral corrected in Epic.

## 2021-11-01 NOTE — Telephone Encounter (Signed)
If patient is being referred for a LOOP recorder the referral was entered incorrectly.  Referral was entered [id number] REFERRALTO CARDIOLOGY and referred to [facility]  who does not have Electrophysiology.  Referral should have been entered as Procedures: [id number] REF TO ELECTROPHYSIOLOGY  Referred to Department:  [facility] [address] [facility]  Could you please enter the necessary corrections for referral for [facility] to schedule the patient with an Electrophysiologist to insert a LOOP recorder.  Thank you.

## 2021-11-01 NOTE — Therapy (Signed)
OUTPATIENT PHYSICAL THERAPY TREATMENT NOTE   Patient Name: Eddie Calhoun MRN: [id number] DOB:05-Dec-1950, 71 y.o., male Today's Date: 11/01/2021  PCP: Josetta Huddle, MD (Switching to Scarlette Calico, MD on 12/12/2021) REFERRING PROVIDER: Josetta Huddle, MD   PT End of Session - 11/01/21 1147     Visit Number 17    Number of Visits 21    Date for PT Re-Evaluation 11/24/21    Authorization Type Medicare & Aarp Supplement  Covered 100%  VL: Follows Medicare guidelines-10th visit PN    Progress Note Due on Visit 20    PT Start Time 1145    PT Stop Time 1230    PT Time Calculation (min) 45 min    Equipment Utilized During Treatment Gait belt    Activity Tolerance Patient tolerated treatment well    Behavior During Therapy [facility] for tasks assessed/performed                Past Medical History:  Diagnosis Date   Allergy    allergic rhinitis   BPH (benign prostatic hyperplasia)    Elevated prostate specific antigen (PSA)    Hypertension    Plantar fasciitis of right foot    Pneumonia    Thrombocytopenia, unspecified ([place]) 07/26/2012   03/24/12  129,000!   History reviewed. No pertinent surgical history. 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  He had carpal tunnel surgery in early February 2023.  Discharged from inpatient rehab 07/17/2021.  PMH:  chronic thrombocytopenia, BPH, hyperlipidemia, HN, AKI  PRECAUTIONS:  Fall  SUBJECTIVE: Pt states he has been bowling and using gym equipment like row machine, leg press, and core machines.  Has been walking up to 3.2 mph on treadmill.     PAIN:  Are you having pain? No  OBJECTIVE:    DIAGNOSTIC FINDINGS: MRI 07/02/2021 showed Acute perforator infarct at the right basal ganglia and corona radiata.   TODAY'S TREATMENT: -Treadmill training:  forward x3 minutes at 2.5 mph and 2% incline > laterally at 0.7 mph no incline each direction x60min w/ inc difficulty when advancing the LLE first > retro-stepping x3 mins w/ BUE support at 1.25mph no incline -Wall squats w/ forward reach to chair and hold 6x10 sec > 2.2 lb weighted ball wall squat w/ chest press x10 > wt ball wall squat with forward lean to chair x10 -Reverse lunges onto soft side of BOSU x40 each LE progressed to no UE support w/ use of mirror for postural feedback  PATIENT EDUCATION: Education details: Continue HEP.  Discussed progression of care with routine exercise class, strength training with trainer, etc.  Discussed progression of functional in chronic stroke stage.  Discussed use of stair stepper and other equipment in the gym and performing some advanced exercises with modifications based on pt described gym setup. Person educated: Patient Education method: Explanation  Education comprehension: verbalized understanding     HOME EXERCISE PROGRAM: Access Code: [email or web address] URL: [email or web address] Date: 09/05/2021 Prepared by: Elease Etienne  Exercises -  Gastroc Stretch on Wall  - 1 x daily - 7 x weekly - 2 sets - 2 reps - 30 sec hold - Seated Ankle Dorsiflexion with Anchored Resistance  - 1 x daily - 7 x weekly - 3 sets - 10 reps - Eccentric Heel Lowering on Step  - 1 x daily - 7 x weekly - 3 sets - 10 reps - Forward Step Down  - 1 x daily - 7 x weekly - 3 sets - 10 reps - Squat Jumps  - 1 x daily - 7 x weekly - 3 sets - 10 reps - Kettlebell Deadlift  - 1 x daily - 7 x weekly  - 3 sets - 10 reps - Single Leg Stance on Foam Pad  - 1 x daily - 5 x weekly - 3 sets - 10 reps -encouraged pt to use toe touch with RLE when LLE in stance for appropriate challenge w/o requiring UE support - Standing with Feet Together on Foam Pad   - 1 x daily - 5 x weekly - 3 sets - 10 reps -added eyes closed for inc challenge same frequency - Romberg Stance Eyes Closed on Foam Pad  - 1 x daily - 5 x weekly - 3 sets - 10 reps -pt instructed to start with eyes open and use back of chair in front for added 3rd pt for balance as needed.   - Tandem Stance on Foam Pad with Eyes Closed  - 1 x daily - 5 x weekly - 3 sets - 10 reps *Pt encourage to vary length of hold time to match length of time he can hold position w/o LOB.  Demo'd appropriate corner setup at home for all standing balance tasks.   - Mountain Climber on Counter  - 1 x daily - 5 x weekly - 3 sets - 10 reps - Single Leg Bridge  - 1 x daily - 5 x weekly - 3 sets - 10 reps  ASSESSMENT:   CLINICAL IMPRESSION: Pt continues to progress towards LTGs and is in agreement to discharge in 2 visits.  Focus of skilled session on addressing gait training from a glut max and med strength standpoint.  His functional level continues to improve as he is consistent and motivated outside clinic.     OBJECTIVE IMPAIRMENTS Abnormal gait, decreased balance, decreased coordination, decreased knowledge of use of DME, difficulty walking, decreased ROM, decreased strength, hypomobility, and impaired flexibility.    ACTIVITY LIMITATIONS cleaning, community activity, driving, laundry, yard work, and hobbies: golfing and bowling .    PERSONAL FACTORS Age, Past/current experiences, and 3+ comorbidities:  chronic thrombocytopenia, BPH, hyperlipidemia, HN, AKI  are also affecting patient's functional outcome.      REHAB POTENTIAL: Excellent   CLINICAL DECISION MAKING: Stable/uncomplicated   EVALUATION COMPLEXITY: Low  NEW GOALS: SHORT TERM GOALS: Target  date:  10/13/2021 Pt will demonstrate SLS of >/=30 seconds on LLE maintaining soft knee bend to demonstrate improved static balance and quad control. Baseline: Pt unable to stand >2-3 seconds on uni LE; 5.5 sec on LLE Goal status: IN PROGRESS  2.  Pt will demo improved left lateral hip flare during gait training to indicate improved glut strength. Baseline: left lateral hip flare present; lateral hip flare still present when pt not thinking about engaging hip Goal status: IN PROGRESS  3.  Pt will demonstrate improved pivot turns on BLE w/o LOB to improve fluidity and efficiency of functional mobility. Baseline: inc difficulty on LLE in stance;  improved pivot turns L and R w/ improved hip muscle engagement Goal status: MET  LONG TERM GOALS: Target date:  11/15/2021 Pt will report regular aerobic activity with advanced HEP maintained for strength, motor control, and endurance of BLE independently. Baseline: Has current HEP Goal status: INITIAL  2.  Pt will ambulate>500 feet on 2MWT to demonstrate improved endurance for functional tasks in home and community.  Baseline: 18' w/ SPC w/ quad tip; 340' supervision no AD Goal status: INITIAL   3.  Pt will ambulate at least 1000 feet with LRAD at independent level of assist with safe speed and improved LLE placement to promote household and community access. Baseline: ataxia 385', scissoring of the LLE using SPC w/ quad tip; 1150' w/o AD supervision level 09/12/2021 Goal status: INITIAL  4.  Pt will improve FGA score to 28/30 in order to demonstrate improved balance and decreased fall risk. Baseline: 25/30 Goal status: INITIAL  5.  Pt will ambulate >300' w/o foot-up brace w/o toe catch for improved safety with functional mobility. Baseline: toe catch w/o foot-up brace Goal status: INITIAL   PLAN: PT FREQUENCY:  1x/wk   PT DURATION: 8 weeks   PLANNED INTERVENTIONS: Therapeutic exercises, Therapeutic activity, Neuromuscular re-education,  Balance training, Gait training, Patient/Family education, Joint mobilization, Stair training, Vestibular training, Orthotic/Fit training, DME instructions, and Manual therapy   PLAN FOR NEXT SESSION: Address hip and ankle strength.  Pivot turns w/ weight on ankle.  SLS w/ lateral reach.  Squat > kickback and abd w/ ankle weight.  Circuit training.  Left hip flexor engagement.  Continue single leg bridging w/ compliant surface. Continue retro/fwd/sideways gait training on TM- work on incline forward.  SLS-airex rebounder.  Pivot turns to prevent medial heel whip on LLE.  Continue weighted squats on BOSU w/ improved quality.  Single leg squats, SLS w/ knee bend.  Ambulate up grass hill to simulate golf environment.  SLS on airex>rebounder.  Single leg deadlift.  Continue use of leg press.   Dual task-maintain L knee control, Wall sits w/ forward lean>weighted ball  Bary Richard, PT, DPT 11/01/2021, 12:53 PM

## 2021-11-03 ENCOUNTER — Telehealth: Payer: Self-pay

## 2021-11-03 DIAGNOSIS — I639 Cerebral infarction, unspecified: Secondary | ICD-10-CM

## 2021-11-03 NOTE — Telephone Encounter (Signed)
-----   Message from Jerline Pain, MD sent at 11/03/2021 10:36 AM EDT ----- Regarding: FW: Loop recorder Contact: [phone] Please set up with EP referral to discuss implantable loop recorder. Cryptogenic stroke with normal event monitor.  Referring is Dr. Leonie Man. Candee Furbish, MD  ----- Message ----- From: Jennefer Bravo Sent: 10/31/2021   8:54 AM EDT To: Jerline Pain, MD; Shellia Cleverly, RN Subject: Melton Alar: Loop recorder                               ----- Message ----- From: Kevin Fenton Sent: 10/30/2021   3:16 PM EDT To: Jennefer Bravo Subject: Loop recorder                                  Pt is requesting call back to discuss loop monitor. He was told that would be the next step after he wore the monitor. Please advise. [phone]

## 2021-11-08 ENCOUNTER — Ambulatory Visit: Payer: Medicare Other | Attending: Physical Medicine and Rehabilitation | Admitting: Physical Therapy

## 2021-11-08 ENCOUNTER — Encounter: Payer: Self-pay | Admitting: Physical Therapy

## 2021-11-08 ENCOUNTER — Encounter: Payer: PRIVATE HEALTH INSURANCE | Admitting: Occupational Therapy

## 2021-11-08 DIAGNOSIS — R2689 Other abnormalities of gait and mobility: Secondary | ICD-10-CM

## 2021-11-08 DIAGNOSIS — I69352 Hemiplegia and hemiparesis following cerebral infarction affecting left dominant side: Secondary | ICD-10-CM

## 2021-11-08 DIAGNOSIS — R2681 Unsteadiness on feet: Secondary | ICD-10-CM

## 2021-11-08 DIAGNOSIS — M6281 Muscle weakness (generalized): Secondary | ICD-10-CM | POA: Diagnosis present

## 2021-11-08 DIAGNOSIS — R278 Other lack of coordination: Secondary | ICD-10-CM

## 2021-11-08 NOTE — Therapy (Signed)
OUTPATIENT PHYSICAL THERAPY TREATMENT NOTE   Patient Name: Eddie Calhoun MRN: [id number] DOB:18-Aug-1950, 71 y.o., male Today's Date: 11/08/2021  PCP: Josetta Huddle, MD (Switching to Scarlette Calico, MD on 12/12/2021) REFERRING PROVIDER: Josetta Huddle, MD   PT End of Session - 11/08/21 1154     Visit Number 18    Number of Visits 21    Date for PT Re-Evaluation 11/24/21    Authorization Type Medicare & Aarp Supplement  Covered 100%  VL: Follows Medicare guidelines-10th visit PN    Progress Note Due on Visit 20    PT Start Time 1150    PT Stop Time 1229    PT Time Calculation (min) 39 min    Equipment Utilized During Treatment Gait belt    Activity Tolerance Patient tolerated treatment well    Behavior During Therapy WFL for tasks assessed/performed                Past Medical History:  Diagnosis Date   Allergy    allergic rhinitis   BPH (benign prostatic hyperplasia)    Elevated prostate specific antigen (PSA)    Hypertension    Plantar fasciitis of right foot    Pneumonia    Thrombocytopenia, unspecified ([place]) 07/26/2012   03/24/12  129,000!   History reviewed. No pertinent surgical history. 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  He had carpal tunnel surgery in early February 2023.  Discharged from inpatient rehab 07/17/2021.  PMH:  chronic thrombocytopenia, BPH, hyperlipidemia, HN, AKI  PRECAUTIONS:  Fall  SUBJECTIVE: Pt states he has his trainer lined up for when he discharges next visit and he feels good about discharge.   PAIN:  Are you having pain? No  OBJECTIVE:    DIAGNOSTIC FINDINGS: MRI 07/02/2021 showed Acute perforator infarct at the right basal ganglia and corona radiata.   TODAY'S TREATMENT: Single leg bridges w/ soft BOSU x10 each LE SLS on airex performing stair taps to 4th step w/o UE support SBA At countertop pt stands in SLS performing lateral reaches outside BOS x6 each leg each side, dec stability on LLE In // bars:  reverse lunges to soft BOSU x12 each LE > front LE on airex performing reverse lunge to soft BOSU x12 each LE; Single leg squats w/ and w/o UE support x10 each LE, pt not able to reach 90-90, time spent providing edu on using wall sits to progress this activity if performing in the gym with parameters to prevent fall and promote safety.  PATIENT EDUCATION: Education details: Continue HEP.  Discussed d/c plan for next visit, current workout regimen, and information to share with trainer to help progress him as he feels inclined to share.  Edu on progression of treadmill training to promote improved hip and ankle strength using incline and UE support as needed.  Further edu on advancing exercises in gym with correct setup for safety. Person educated: Patient Education method: Explanation  Education comprehension: verbalized understanding  HOME EXERCISE PROGRAM: Access Code: [email or web address] URL: [email or web address] Date: 09/05/2021 Prepared by: [place] on Wall  - 1 x daily - 7 x weekly - 2 sets - 2 reps - 30 sec hold - Seated Ankle Dorsiflexion with Anchored Resistance  - 1 x daily - 7 x weekly - 3 sets - 10 reps - Eccentric Heel Lowering on Step  - 1 x daily - 7 x weekly - 3 sets - 10 reps - Forward Step Down  - 1 x daily - 7 x weekly - 3 sets - 10 reps - Squat Jumps  - 1 x daily - 7 x weekly - 3  sets - 10 reps - Kettlebell Deadlift  - 1 x daily - 7 x weekly - 3 sets - 10 reps - Single Leg Stance on Foam Pad  - 1 x daily - 5 x weekly - 3 sets - 10 reps -encouraged pt to use toe touch with RLE when LLE in stance for appropriate challenge w/o requiring UE support - Standing with Feet Together on Foam Pad   - 1 x daily - 5 x weekly - 3 sets - 10 reps -added eyes closed for inc challenge same frequency - Romberg Stance Eyes Closed on Foam Pad  - 1 x daily - 5 x weekly - 3 sets - 10 reps -pt instructed to start with eyes open and use back of chair in front for added 3rd pt for balance as needed.   - Tandem Stance on Foam Pad with Eyes Closed  - 1 x daily - 5 x weekly - 3 sets - 10 reps *Pt encourage to vary length of hold time to match length of time he can hold position w/o LOB.  Demo'd appropriate corner setup at home for all standing balance tasks.   - Mountain Climber on Counter  - 1 x daily - 5 x weekly - 3 sets - 10 reps - Single Leg Bridge  - 1 x daily - 5 x weekly - 3 sets - 10 reps  ASSESSMENT:   CLINICAL IMPRESSION: Focus of skilled session today on addressing single leg stability to promote functional strength gains in primarily the LLE.  Pt continues to demonstrate good progress and understanding of progression of chronic stroke deficits.  Planning to D/C next session with pt in agreement to continuum of care with personal trainer following D/C to progress functional strength gains as he is at an appropriate functional level to do so.  Will assess LTGs at next session.     OBJECTIVE IMPAIRMENTS Abnormal gait, decreased balance, decreased coordination, decreased knowledge of use of DME, difficulty walking, decreased ROM, decreased strength, hypomobility, and impaired flexibility.    ACTIVITY LIMITATIONS cleaning, community activity, driving, laundry, yard work, and hobbies: golfing and bowling .    PERSONAL FACTORS Age, Past/current experiences, and 3+ comorbidities:  chronic  thrombocytopenia, BPH, hyperlipidemia, HN, AKI  are also affecting patient's functional outcome.      REHAB POTENTIAL: Excellent   CLINICAL DECISION MAKING: Stable/uncomplicated   EVALUATION COMPLEXITY: Low  NEW GOALS: SHORT TERM GOALS: Target date:  10/13/2021 Pt will demonstrate SLS of >/=30 seconds on LLE maintaining soft knee bend to demonstrate improved static balance and quad control. Baseline: Pt unable to stand >2-3 seconds on uni LE; 5.5 sec on LLE Goal status: IN PROGRESS  2.  Pt will demo improved left lateral hip flare during gait training to indicate improved glut strength. Baseline: left lateral hip  flare present; lateral hip flare still present when pt not thinking about engaging hip Goal status: IN PROGRESS  3.  Pt will demonstrate improved pivot turns on BLE w/o LOB to improve fluidity and efficiency of functional mobility. Baseline: inc difficulty on LLE in stance; improved pivot turns L and R w/ improved hip muscle engagement Goal status: MET  LONG TERM GOALS: Target date:  11/15/2021 Pt will report regular aerobic activity with advanced HEP maintained for strength, motor control, and endurance of BLE independently. Baseline: Has current HEP Goal status: INITIAL  2.  Pt will ambulate>500 feet on 2MWT to demonstrate improved endurance for functional tasks in home and community.  Baseline: 64' w/ SPC w/ quad tip; 340' supervision no AD Goal status: INITIAL   3.  Pt will ambulate at least 1000 feet with LRAD at independent level of assist with safe speed and improved LLE placement to promote household and community access. Baseline: ataxia 385', scissoring of the LLE using SPC w/ quad tip; 1150' w/o AD supervision level 09/12/2021 Goal status: INITIAL  4.  Pt will improve FGA score to 28/30 in order to demonstrate improved balance and decreased fall risk. Baseline: 25/30 Goal status: INITIAL  5.  Pt will ambulate >300' w/o foot-up brace w/o toe catch for improved  safety with functional mobility. Baseline: toe catch w/o foot-up brace Goal status: INITIAL   PLAN: PT FREQUENCY:  1x/wk   PT DURATION: 8 weeks   PLANNED INTERVENTIONS: Therapeutic exercises, Therapeutic activity, Neuromuscular re-education, Balance training, Gait training, Patient/Family education, Joint mobilization, Stair training, Vestibular training, Orthotic/Fit training, DME instructions, and Manual therapy   PLAN FOR NEXT SESSION: Assess LTGs-D/C! Circuit training.  Left hip flexor engagement.  Single leg squats, SLS w/ knee bend.  Ambulate up grass hill to simulate golf environment.  SLS on airex>rebounder.  Dual task-maintain L knee control.  Bary Richard, PT, DPT 11/08/2021, 1:36 PM

## 2021-11-10 ENCOUNTER — Ambulatory Visit: Payer: PRIVATE HEALTH INSURANCE | Admitting: Physical Medicine and Rehabilitation

## 2021-11-15 ENCOUNTER — Ambulatory Visit: Payer: Medicare Other | Admitting: Physical Therapy

## 2021-11-15 ENCOUNTER — Encounter: Payer: Self-pay | Admitting: Neurology

## 2021-11-15 ENCOUNTER — Ambulatory Visit (INDEPENDENT_AMBULATORY_CARE_PROVIDER_SITE_OTHER): Payer: Medicare Other | Admitting: Neurology

## 2021-11-15 ENCOUNTER — Encounter: Payer: Self-pay | Admitting: Physical Therapy

## 2021-11-15 VITALS — BP 114/67 | HR 65 | Ht 70.0 in | Wt 179.0 lb

## 2021-11-15 DIAGNOSIS — I6381 Other cerebral infarction due to occlusion or stenosis of small artery: Secondary | ICD-10-CM

## 2021-11-15 DIAGNOSIS — R2681 Unsteadiness on feet: Secondary | ICD-10-CM | POA: Diagnosis not present

## 2021-11-15 DIAGNOSIS — R29898 Other symptoms and signs involving the musculoskeletal system: Secondary | ICD-10-CM

## 2021-11-15 DIAGNOSIS — Z8673 Personal history of transient ischemic attack (TIA), and cerebral infarction without residual deficits: Secondary | ICD-10-CM | POA: Diagnosis not present

## 2021-11-15 DIAGNOSIS — I69352 Hemiplegia and hemiparesis following cerebral infarction affecting left dominant side: Secondary | ICD-10-CM

## 2021-11-15 DIAGNOSIS — M6281 Muscle weakness (generalized): Secondary | ICD-10-CM

## 2021-11-15 DIAGNOSIS — R278 Other lack of coordination: Secondary | ICD-10-CM

## 2021-11-15 DIAGNOSIS — R2689 Other abnormalities of gait and mobility: Secondary | ICD-10-CM

## 2021-11-15 NOTE — Therapy (Signed)
OUTPATIENT PHYSICAL THERAPY TREATMENT NOTE/DISCHARGE SUMMARY   Patient Name: Eddie Calhoun MRN: [id number] DOB:1950-08-31, 71 y.o., male Today's Date: 11/15/2021  PCP: Josetta Huddle, MD (Switching to Scarlette Calico, MD on 12/12/2021) REFERRING PROVIDER: Josetta Huddle, MD  PHYSICAL THERAPY DISCHARGE SUMMARY  Visits from Start of Care: 19  Current functional level related to goals / functional outcomes: See clinical impression statement.   Remaining deficits: Mild left trendelenburg and left foot slap   Education / Equipment: D/C plan, continued activity, following up with doctor as needed.   Patient agrees to discharge. Patient goals were met. Patient is being discharged due to meeting the stated rehab goals.    PT End of Session - 11/15/21 1018     Visit Number 19    Number of Visits 21    Date for PT Re-Evaluation 11/24/21    Authorization Type Medicare & Aarp Supplement  Covered 100%  VL: Follows Medicare guidelines-10th visit PN    Progress Note Due on Visit 20    PT Start Time 1016    PT Stop Time 1046    PT Time Calculation (min) 30 min    Equipment Utilized During Treatment Gait belt    Activity Tolerance Patient tolerated treatment well    Behavior During Therapy WFL for tasks assessed/performed                Past Medical History:  Diagnosis Date   Allergy    allergic rhinitis   BPH (benign prostatic hyperplasia)    Elevated prostate specific antigen (PSA)    Hypertension    Plantar fasciitis of right foot    Pneumonia    Thrombocytopenia, unspecified ([place]) 07/26/2012   03/24/12  129,000!   History reviewed. No pertinent surgical history. 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  He had carpal tunnel surgery in early February 2023.  Discharged from inpatient rehab 07/17/2021.  PMH:  chronic thrombocytopenia, BPH, hyperlipidemia, HN, AKI  PRECAUTIONS: Fall  SUBJECTIVE: Pt denies falls, trips, or near misses.  He states his appts with his trainer begin next week.  He has been bowling and push mowing his lawn.  He got his treadmill to 6% incline.  He sees Dr. Leonie Man today, they moved his appt up.  PAIN:  Are you having pain? No  OBJECTIVE:    DIAGNOSTIC FINDINGS: MRI 07/02/2021 showed Acute perforator infarct at the right basal ganglia and corona radiata.   TODAY'S TREATMENT: Assessed FGA:  [facility] PT Assessment - 11/15/21 1037       Functional Gait  Assessment   Gait assessed  Yes    Gait Level Surface Walks 20 ft in less than 7 sec but greater than 5.5 sec, uses assistive device, slower speed, mild gait deviations, or deviates 6-10 in outside of the 12 in walkway width.    Change in Gait Speed Able to smoothly change walking speed without loss of balance or gait deviation. Deviate no more than 6 in outside of  the 12 in walkway width.    Gait with Horizontal Head Turns Performs head turns smoothly with no change in gait. Deviates no more than 6 in outside 12 in walkway width    Gait with Vertical Head Turns Performs head turns with no change in gait. Deviates no more than 6 in outside 12 in walkway width.    Gait and Pivot Turn Pivot turns safely within 3 sec and stops quickly with no loss of balance.    Step Over Obstacle Is able to step over 2 stacked shoe boxes taped together (9 in total height) without changing gait speed. No evidence of imbalance.    Gait with Narrow  Base of Support Ambulates 7-9 steps.   mild instability on initiation w/ good recovery for 8 steps   Gait with Eyes Closed Walks 20 ft, no assistive devices, good speed, no evidence of imbalance, normal gait pattern, deviates no more than 6 in outside 12 in walkway width. Ambulates 20 ft in less than 7 sec.    Ambulating Backwards Walks 20 ft, no assistive devices, good speed, no evidence for imbalance, normal gait    Steps Alternating feet, no rail.    Total Score 28    FGA comment: Low Fall Risk           -See LTGs section for gait updates. -Discussed weight shift on stairs and avoiding toe catch with differing stair setups.  Discussed fall prevention strategies with attention to gait quality and using best judgement when needing to slow down to perform new tasks.  PT provides advice on progression of current gym routine with focus on quality of movement.  PATIENT EDUCATION: Education details: D/C plan. Person educated: Patient Education method: Explanation  Education comprehension: verbalized understanding     HOME EXERCISE PROGRAM: Access Code: [email or web address] URL: [email or web address] Date: 09/05/2021 Prepared by: [place] on Wall  - 1 x daily - 7 x weekly - 2 sets - 2 reps - 30 sec hold - Seated Ankle Dorsiflexion with Anchored Resistance  - 1 x daily - 7 x weekly - 3 sets - 10 reps - Eccentric Heel Lowering on Step  - 1 x daily - 7 x weekly - 3 sets - 10 reps - Forward Step Down  - 1 x daily - 7 x weekly - 3 sets - 10 reps - Squat Jumps  - 1 x daily - 7 x weekly - 3 sets - 10 reps - Kettlebell Deadlift  - 1 x daily - 7 x weekly - 3 sets - 10 reps - Single Leg Stance on Foam Pad  - 1 x daily - 5 x weekly - 3 sets - 10 reps -encouraged pt to use toe touch with RLE when LLE in stance for appropriate challenge w/o requiring UE support - Standing with Feet Together on Foam Pad   - 1 x daily - 5 x weekly - 3 sets - 10 reps -added eyes  closed for inc challenge same frequency - Romberg Stance Eyes Closed on Foam Pad  - 1 x daily - 5 x weekly - 3 sets - 10 reps -pt instructed to start with eyes open and use back of chair in front for added 3rd pt for balance as needed.   - Tandem Stance on Foam Pad with Eyes Closed  - 1 x daily - 5 x weekly - 3 sets - 10 reps *Pt encourage to vary length of hold time to match length  of time he can hold position w/o LOB.  Demo'd appropriate corner setup at home for all standing balance tasks.   - Mountain Climber on Counter  - 1 x daily - 5 x weekly - 3 sets - 10 reps - Single Leg Bridge  - 1 x daily - 5 x weekly - 3 sets - 10 reps  ASSESSMENT:   CLINICAL IMPRESSION: Assessment of LTGs this session in preparation for discharge from PT this session.  Pt has made significant progress with skilled PT demonstrating decreased fall risk as assessed by FGA improving from 20 on evaluation to 28/30 today.  He continues to demonstrates some mild left trendelenburg and left foot slap with increased ambulation distance, but is otherwise independent with all ambulation tasks tolerating >1000' this session. In addition to minor gait deviations he remains challenged by tandem BOS.  Encouraged continuum of care with trainer and continued extracurricular activities to maintain gains and fine tune progress over extended time.  Pt has good safety awareness and is able to verbalize strategies to prevent falls in the future at the end of session.  Will discharge from PT as patient has met goals and maximized functional progress with this level of care and is appropriate to return to training in community setting.   OBJECTIVE IMPAIRMENTS Abnormal gait, decreased balance, decreased coordination, decreased knowledge of use of DME, difficulty walking, decreased ROM, decreased strength, hypomobility, and impaired flexibility.    ACTIVITY LIMITATIONS cleaning, community activity, driving, laundry, yard work, and hobbies: golfing  and bowling .    PERSONAL FACTORS Age, Past/current experiences, and 3+ comorbidities:  chronic thrombocytopenia, BPH, hyperlipidemia, HN, AKI  are also affecting patient's functional outcome.      REHAB POTENTIAL: Excellent   CLINICAL DECISION MAKING: Stable/uncomplicated   EVALUATION COMPLEXITY: Low  NEW GOALS: SHORT TERM GOALS: Target date:  10/13/2021 Pt will demonstrate SLS of >/=30 seconds on LLE maintaining soft knee bend to demonstrate improved static balance and quad control. Baseline: Pt unable to stand >2-3 seconds on uni LE; 5.5 sec on LLE Goal status: IN PROGRESS  2.  Pt will demo improved left lateral hip flare during gait training to indicate improved glut strength. Baseline: left lateral hip flare present; lateral hip flare still present when pt not thinking about engaging hip Goal status: IN PROGRESS  3.  Pt will demonstrate improved pivot turns on BLE w/o LOB to improve fluidity and efficiency of functional mobility. Baseline: inc difficulty on LLE in stance; improved pivot turns L and R w/ improved hip muscle engagement Goal status: MET  LONG TERM GOALS: Target date:  11/15/2021 Pt will report regular aerobic activity with advanced HEP maintained for strength, motor control, and endurance of BLE independently. Baseline: Pt has appts set up with trainer beginning next week, he attends gym regularly using stair stepper, bike, and treadmill, continues use of HEP intermittently. Goal status: MET  2.  Pt will ambulate>500 feet on 2MWT to demonstrate improved endurance for functional tasks in home and community.  Baseline: 64' w/ SPC w/ quad tip; 340' supervision no AD; 11/15/2021 498' no AD  Goal status: PARTIALLY MET   3.  Pt will ambulate at least 1000 feet with LRAD at independent level of assist with safe speed and improved LLE placement to promote household and community access. Baseline: ataxia 385', scissoring of the LLE using SPC w/ quad tip; 1150' w/o AD  supervision level 09/12/2021; 1,265' no AD independent consistent pace Goal status: MET  4.  Pt  will improve FGA score to 28/30 in order to demonstrate improved balance and decreased fall risk. Baseline: 25/30; 28/30 Goal status: MET  5.  Pt will ambulate >300' w/o foot-up brace w/o toe catch for improved safety with functional mobility. Baseline: toe catch w/o foot-up brace; no AFO or brace needed for ambulation > 1000 feet Goal status: MET  PLAN: PT FREQUENCY:  1x/wk   PT DURATION: 8 weeks   PLANNED INTERVENTIONS: Therapeutic exercises, Therapeutic activity, Neuromuscular re-education, Balance training, Gait training, Patient/Family education, Joint mobilization, Stair training, Vestibular training, Orthotic/Fit training, DME instructions, and Manual therapy   PLAN FOR NEXT SESSION: N/A  Bary Richard, PT, DPT 11/15/2021, 11:03 AM

## 2021-11-15 NOTE — Patient Instructions (Signed)
I had a long d/w patient about his recent stroke, discussed results of 30-day heart monitor, risk for recurrent stroke/TIAs, personally independently reviewed imaging studies and stroke evaluation results and answered questions.Continue aspirin 81 mg daily  for secondary stroke prevention and maintain strict control of hypertension with blood pressure goal below 130/90, diabetes with hemoglobin A1c goal below 6.5% and lipids with LDL cholesterol goal below 70 mg/dL. I also advised the patient to eat a healthy diet with plenty of whole grains, cereals, fruits and vegetables, exercise regularly and maintain ideal body weight .Continue scheduled appointment with Dr. Lovena Le for loop recorder.  Followup in the future with me in 1 year or call earlier if necessary. Stroke Prevention Some medical conditions and behaviors can lead to a higher chance of having a stroke. You can help prevent a stroke by eating healthy, exercising, not smoking, and managing any medical conditions you have. Stroke is a leading cause of functional impairment. Primary prevention is particularly important because a majority of strokes are first-time events. Stroke changes the lives of not only those who experience a stroke but also their family and other caregivers. How can this condition affect me? A stroke is a medical emergency and should be treated right away. A stroke can lead to brain damage and can sometimes be life-threatening. If a person gets medical treatment right away, there is a better chance of surviving and recovering from a stroke. What can increase my risk? The following medical conditions may increase your risk of a stroke: Cardiovascular disease. High blood pressure (hypertension). Diabetes. High cholesterol. Sickle cell disease. Blood clotting disorders (hypercoagulable state). Obesity. Sleep disorders (obstructive sleep apnea). Other risk factors include: Being older than age 70. Having a history of blood  clots, stroke, or mini-stroke (transient ischemic attack, TIA). Genetic factors, such as race, ethnicity, or a family history of stroke. Smoking cigarettes or using other tobacco products. Taking birth control pills, especially if you also use tobacco. Heavy use of alcohol or drugs, especially cocaine and methamphetamine. Physical inactivity. What actions can I take to prevent this? Manage your health conditions High cholesterol levels. Eating a healthy diet is important for preventing high cholesterol. If cholesterol cannot be managed through diet alone, you may need to take medicines. Take any prescribed medicines to control your cholesterol as told by your health care provider. Hypertension. To reduce your risk of stroke, try to keep your blood pressure below 130/80. Eating a healthy diet and exercising regularly are important for controlling blood pressure. If these steps are not enough to manage your blood pressure, you may need to take medicines. Take any prescribed medicines to control hypertension as told by your health care provider. Ask your health care provider if you should monitor your blood pressure at home. Have your blood pressure checked every year, even if your blood pressure is normal. Blood pressure increases with age and some medical conditions. Diabetes. Eating a healthy diet and exercising regularly are important parts of managing your blood sugar (glucose). If your blood sugar cannot be managed through diet and exercise, you may need to take medicines. Take any prescribed medicines to control your diabetes as told by your health care provider. Get evaluated for obstructive sleep apnea. Talk to your health care provider about getting a sleep evaluation if you snore a lot or have excessive sleepiness. Make sure that any other medical conditions you have, such as atrial fibrillation or atherosclerosis, are managed. Nutrition Follow instructions from your health care  provider about  what to eat or drink to help manage your health condition. These instructions may include: Reducing your daily calorie intake. Limiting how much salt (sodium) you use to 1,500 milligrams (mg) each day. Using only healthy fats for cooking, such as olive oil, canola oil, or sunflower oil. Eating healthy foods. You can do this by: Choosing foods that are high in fiber, such as whole grains, and fresh fruits and vegetables. Eating at least 5 servings of fruits and vegetables a day. Try to fill one-half of your plate with fruits and vegetables at each meal. Choosing lean protein foods, such as lean cuts of meat, poultry without skin, fish, tofu, beans, and nuts. Eating low-fat dairy products. Avoiding foods that are high in sodium. This can help lower blood pressure. Avoiding foods that have saturated fat, trans fat, and cholesterol. This can help prevent high cholesterol. Avoiding processed and prepared foods. Counting your daily carbohydrate intake.  Lifestyle If you drink alcohol: Limit how much you have to: 0-1 drink a day for women who are not pregnant. 0-2 drinks a day for men. Know how much alcohol is in your drink. In the [place] one drink equals one 12 oz bottle of beer (328m), one 5 oz glass of wine (149m, or one 1 oz glass of hard liquor (4424m Do not use any products that contain nicotine or tobacco. These products include cigarettes, chewing tobacco, and vaping devices, such as e-cigarettes. If you need help quitting, ask your health care provider. Avoid secondhand smoke. Do not use drugs. Activity  Try to stay at a healthy weight. Get at least 30 minutes of exercise on most days, such as: Fast walking. Biking. Swimming. Medicines Take over-the-counter and prescription medicines only as told by your health care provider. Aspirin or blood thinners (antiplatelets or anticoagulants) may be recommended to reduce your risk of forming blood clots that can lead to  stroke. Avoid taking birth control pills. Talk to your health care provider about the risks of taking birth control pills if: You are over 35 55ars old. You smoke. You get very bad headaches. You have had a blood clot. Where to find more information American Stroke Association: www.strokeassociation.org Get help right away if: You or a loved one has any symptoms of a stroke. "BE FAST" is an easy way to remember the main warning signs of a stroke: B - Balance. Signs are dizziness, sudden trouble walking, or loss of balance. E - Eyes. Signs are trouble seeing or a sudden change in vision. F - Face. Signs are sudden weakness or numbness of the face, or the face or eyelid drooping on one side. A - Arms. Signs are weakness or numbness in an arm. This happens suddenly and usually on one side of the body. S - Speech. Signs are sudden trouble speaking, slurred speech, or trouble understanding what people say. T - Time. Time to call emergency services. Write down what time symptoms started. You or a loved one has other signs of a stroke, such as: A sudden, severe headache with no known cause. Nausea or vomiting. Seizure. These symptoms may represent a serious problem that is an emergency. Do not wait to see if the symptoms will go away. Get medical help right away. Call your local emergency services (911 in the [place] Do not drive yourself to the hospital. Summary You can help to prevent a stroke by eating healthy, exercising, not smoking, limiting alcohol intake, and managing any medical conditions you may have. Do not use any  products that contain nicotine or tobacco. These include cigarettes, chewing tobacco, and vaping devices, such as e-cigarettes. If you need help quitting, ask your health care provider. Remember "BE FAST" for warning signs of a stroke. Get help right away if you or a loved one has any of these signs. This information is not intended to replace advice given to you by your  health care provider. Make sure you discuss any questions you have with your health care provider. Document Revised: 11/23/2019 Document Reviewed: 11/23/2019 Elsevier Patient Education  [place].

## 2021-11-15 NOTE — Progress Notes (Signed)
[facility] [address] [place]. [place] [address] [phone]       OFFICE FOLLOW UP VISIT NOTE  [name] Date of Birth:  [birth year] Medical Record Number:  [id number]   Referring MD:  Silvestre Mesi PA-c  Reason for Referral:  Stroke  HPI: Initial visit 09/05/2021 : Eddie Calhoun is a 71 year old male seen today for initial office consultation visit for stroke.  He is accompanied by his wife.  History is obtained from them and review of electronic medical records and I have personally reviewed pertinent available imaging films in PACS.  He has a past medical history of hypertension, bilateral carpal tunnel surgeries, benign prostatic hypertrophy, seasonal allergies who presented on 07/01/2021 with sudden onset of left-sided heaviness involving face arm and leg and weakness.  He had difficulty raising his left arm and leg.  He called EMS who noticed facial asymmetry and significant left-sided weakness and activated a code stroke.  Noncontrast CT scan of the head was negative for any acute process.  Given recent carpal tunnel surgery there was careful discussion about risk benefits of IV TNK and patient agreed to proceed and this was administered with the door to needle time of only 9 minutes.  Patient did remarkably well and started improving shortly thereafter.  MRI scan of the brain showed a moderate-sized large basal ganglia and corona radiata infarct.  MR angiogram of the brain showed no significant proximal large vessel stenosis but did show some irregularities in the right P3 P4 segments.  Echocardiogram showed normal ejection fraction of 60 to 65% without any clot.  LDL cholesterol was 79 mg percent.  Hemoglobin A1c was 5.5.  MR angiogram of the neck showed no extracranial large vessel stenosis.  Patient started on dual antiplatelet therapy aspirin and Plavix and transferred to inpatient rehab and did really well he is currently at home and walking independently.  He  is doing still outpatient physical and Occupational Therapy.  His left hand strength is improved significantly though fine motor skills are still diminished.  His left foot is still heavy and drags and he has to wear an ankle-foot brace.  His on aspirin tolerating well without bruising or bleeding.  His blood pressure is under good control.  He is tolerating Crestor well without muscle aches or pains.  He has not had any recurrent stroke or TIA symptoms. Update 11/15/2021 ; He returns for follow-up after last visit 2 months ago.  Patient states he is doing well.  This is finished outpatient physical and Occupational Therapy.  Still has some diminished fine motor skills in the left foot with some numbness and and but is walking independently.  He is tolerating aspirin well without bruising or bleeding.  His planning to see a personal trainer at the gym facility to continue to improve further.  Blood pressures well controlled today it is 114/67.  He remains on Crestor which is tolerating well without muscle aches and pains.  He has an appointment coming to see Dr. Lovena Le to discuss loop recorder.  His insurance company refused it without first 2030-day heart monitor which was done on 09/20/2021 and showed no evidence of A-fib.  He has no new complaints today. ROS:   14 system review of systems is positive for slurred speech, foot numbness, weakness all other systems negative  PMH:  Past Medical History:  Diagnosis Date   Allergy    allergic rhinitis   BPH (benign prostatic hyperplasia)    Elevated prostate specific  antigen (PSA)    Hypertension    Plantar fasciitis of right foot    Pneumonia    Thrombocytopenia, unspecified ([place]) 07/26/2012   03/24/12  129,000!    Social History:  Social History   Socioeconomic History   Marital status: Married    Spouse name: Not on file   Number of children: Not on file   Years of education: Not on file   Highest education level: Not on file  Occupational  History   Not on file  Tobacco Use   Smoking status: Former    Packs/day: 2.00    Years: 20.00    Total pack years: 40.00    Types: Cigarettes    Quit date: 09/21/1987    Years since quitting: 34.1   Smokeless tobacco: Never  Vaping Use   Vaping Use: Never used  Substance and Sexual Activity   Alcohol use: No   Drug use: Never   Sexual activity: Not Currently  Other Topics Concern   Not on file  Social History Narrative   Not on file   Social Determinants of Health   Financial Resource Strain: Not on file  Food Insecurity: Not on file  Transportation Needs: Not on file  Physical Activity: Not on file  Stress: Not on file  Social Connections: Not on file  Intimate Partner Violence: Not on file    Medications:   Current Outpatient Medications on File Prior to Visit  Medication Sig Dispense Refill   acetaminophen (TYLENOL) 325 MG tablet Take 2 tablets (650 mg total) by mouth every 4 (four) hours as needed for mild pain (or temp > 37.5 C (99.5 F)). 30 tablet 0   aspirin EC 81 MG tablet 1 tablet     cholecalciferol (VITAMIN D3) 25 MCG (1000 UNIT) tablet Take 1 tablet (1,000 Units total) by mouth daily. 30 tablet 0   Multiple Vitamin (MULTIVITAMIN) tablet Take 1 tablet by mouth daily.       olmesartan (BENICAR) 20 MG tablet Take 10 mg by mouth daily.     rosuvastatin (CRESTOR) 20 MG tablet Take 20 mg by mouth daily.     tamsulosin (FLOMAX) 0.4 MG CAPS capsule Take 1 capsule (0.4 mg total) by mouth daily after supper. 30 capsule 0   vitamin B-12 (CYANOCOBALAMIN) 1000 MCG tablet Take 1 tablet (1,000 mcg total) by mouth daily. 30 tablet 0   No current facility-administered medications on file prior to visit.    Allergies:   Allergies  Allergen Reactions   Contrast Media [Iodinated Contrast Media] Hives   Dutasteride Other (See Comments)   Iodine Other (See Comments)    Physical Exam General: well developed, well nourished pleasant elderly male, seated, in no evident  distress Head: head normocephalic and atraumatic.   Neck: supple with no carotid or supraclavicular bruits Cardiovascular: regular rate and rhythm, no murmurs Musculoskeletal: no deformity Skin:  no rash/petichiae Vascular:  Normal pulses all extremities  Neurologic Exam Mental Status: Awake and fully alert. Oriented to place and time. Recent and remote memory intact. Attention span, concentration and fund of knowledge appropriate. Mood and affect appropriate.  Cranial Nerves: Fundoscopic exam not done. Pupils equal, briskly reactive to light. Extraocular movements full without nystagmus. Visual fields full to confrontation. Hearing intact. Facial sensation intact. Face, tongue, palate moves normally and symmetrically.  Motor: Normal bulk and tone. Normal strength in all tested extremity muscles.  Except slight diminished fine finger movements on the left.  Orbits right over left upper extremity.  Mild ankle dorsiflexor weakness on the left.  Tone is increased in the left leg. Sensory.: intact to touch , pinprick , position and vibratory sensation.  Coordination: Rapid alternating movements normal in all extremities. Finger-to-nose and heel-to-shin performed accurately bilaterally.  Unsteady while standing on left foot unsupported. Gait and Station: Arises from chair without difficulty. Stance is normal. Gait demonstrates slight stiffness and dragging of the left leg..  Tandem walking not tested.  Reflexes: 1+ and asymmetric and brisker on the left. Toes downgoing.   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  Vascular risk factors of hyperlipidemia and hypertension.  He is doing well except for mild left leg and hand weakness.     PLAN:I had a long d/w patient about his recent stroke, discussed results of 30-day heart monitor, risk for recurrent stroke/TIAs, personally independently  reviewed imaging studies and stroke evaluation results and answered questions.Continue aspirin 81 mg daily  for secondary stroke prevention and maintain strict control of hypertension with blood pressure goal below 130/90, diabetes with hemoglobin A1c goal below 6.5% and lipids with LDL cholesterol goal below 70 mg/dL. I also advised the patient to eat a healthy diet with plenty of whole grains, cereals, fruits and vegetables, exercise regularly and maintain ideal body weight .Continue scheduled appointment with Dr. Lovena Le for loop recorder.  Followup in the future with me in 1 year or call earlier if necessary. Greater than 50% time during this 35-minute consultation visit was spent on counseling and coordination of care about his hemiparesis and cryptogenic stroke and discussion about evaluation and treatment plan and answering questions.  Antony Contras, MD Note: This document was prepared with digital dictation and possible smart phrase technology. Any transcriptional errors that result from this process are unintentional.

## 2021-11-18 ENCOUNTER — Encounter: Payer: Self-pay | Admitting: Podiatry

## 2021-12-04 ENCOUNTER — Encounter
Payer: Medicare Other | Attending: Physical Medicine and Rehabilitation | Admitting: Physical Medicine and Rehabilitation

## 2021-12-04 ENCOUNTER — Encounter: Payer: Self-pay | Admitting: Physical Medicine and Rehabilitation

## 2021-12-04 VITALS — BP 131/81 | HR 48 | Ht 70.0 in | Wt 182.6 lb

## 2021-12-04 DIAGNOSIS — I6381 Other cerebral infarction due to occlusion or stenosis of small artery: Secondary | ICD-10-CM | POA: Diagnosis not present

## 2021-12-04 DIAGNOSIS — M21372 Foot drop, left foot: Secondary | ICD-10-CM | POA: Diagnosis not present

## 2021-12-04 DIAGNOSIS — I639 Cerebral infarction, unspecified: Secondary | ICD-10-CM | POA: Insufficient documentation

## 2021-12-04 NOTE — Progress Notes (Signed)
Subjective:    Patient ID: Eddie Calhoun, male    DOB: 04/02/1951, 71 y.o.   MRN: [id number]  HPI  Pt is a 71 yr old male with R BG/corona radiata lacunar infarct in March 2023- with L hemiparesis; HTN, chronic thrombocytopenia and BPH and recent B/L carpal tunnel surgeries;  Here for f/u on Stroke   Graduated from therapy 2 weeks ago Going to personal trainer 2 1/2 hour sessions/week And also does 45 minutes 2x/week as well at gym Can do 250 steps in 4 minutes on stair stepper.  And non exercise days, does 1-2 mile walk in neighborhood.    Wife thinks is foot slapping still; but lifting the toe is better More of an issue going downhill in neighborhood.   Compensating in R hip and entire RLE "comes up". and L shoulder go "out"- circumducts with L leg. Doing smaller steps as well  Wife also having concerns with memory.  Not quiet and gentle anymore- more assertive and on occ cantankerous.   Saying some comments that concern wife-  Saying things that sets off wife- or "embarrasses himself".  Doesn't fit the situation- non sequitur  Temperature on L side feels cooler to pt and wife.  Cannot feel if muscles getting tired or feel the "burn" when working out.   Exhausted all the time- because has to think through everything- not automatic anymore.    Takes a nap 2x/day will take 10 minutes nap- that's normal for him. Feels great and recharged after that nap.   Been accepted for peer program- for stroke! Going to support group monthly.  At [facility].   Also describes a few spasms at night in LLE. Also L leg feels more normal- not quite back, but not like a "lump"  Back to bowling with 15 lbs and doing yard work and cooks most nights and vacuums.   Pain Inventory Average Pain 0 Pain Right Now 1 My pain is aching  LOCATION OF PAIN  back  BOWEL Number of stools per week: 7    BLADDER Normal    Mobility walk without assistance how many minutes can you walk?  40 ability to climb steps?  yes do you drive?  yes  Function retired  Neuro/Psych No problems in this area  Prior Studies Any changes since last visit?  no  Physicians involved in your care Any changes since last visit?  no   Family History  Problem Relation Age of Onset   Hypertension Mother    Hypertension Father    Cancer Father 14       father died with hx of prostate cancer   Social History   Socioeconomic History   Marital status: Married    Spouse name: Not on file   Number of children: Not on file   Years of education: Not on file   Highest education level: Not on file  Occupational History   Not on file  Tobacco Use   Smoking status: Former    Packs/day: 2.00    Years: 20.00    Total pack years: 40.00    Types: Cigarettes    Quit date: 09/21/1987    Years since quitting: 34.2   Smokeless tobacco: Never  Vaping Use   Vaping Use: Never used  Substance and Sexual Activity   Alcohol use: No   Drug use: Never   Sexual activity: Not Currently  Other Topics Concern   Not on file  Social History Narrative   Not  on file   Social Determinants of Health   Financial Resource Strain: Not on file  Food Insecurity: Not on file  Transportation Needs: Not on file  Physical Activity: Not on file  Stress: Not on file  Social Connections: Not on file   No past surgical history on file. Past Medical History:  Diagnosis Date   Allergy    allergic rhinitis   BPH (benign prostatic hyperplasia)    Elevated prostate specific antigen (PSA)    Hypertension    Plantar fasciitis of right foot    Pneumonia    Thrombocytopenia, unspecified ([place]) 07/26/2012   03/24/12  129,000!   BP 131/81   Pulse (!) 48   Ht '5\' 10"'$  (1.778 m)   Wt 182 lb 9.6 oz (82.8 kg)   SpO2 96%   BMI 26.20 kg/m   Opioid Risk Score:   Fall Risk Score:  `1  Depression screen [facility] 2/9     12/04/2021   10:28 AM 08/23/2021   10:21 AM 07/28/2021    1:14 PM  Depression screen PHQ 2/9   Decreased Interest 0 0 0  Down, Depressed, Hopeless 0 0 0  PHQ - 2 Score 0 0 0  Altered sleeping   0  Tired, decreased energy   1  Change in appetite   0  Feeling bad or failure about yourself    0  Trouble concentrating   0  Moving slowly or fidgety/restless   3  Suicidal thoughts   0  PHQ-9 Score   4     Review of Systems  Constitutional: Negative.   HENT: Negative.    Eyes: Negative.   Respiratory: Negative.    Cardiovascular: Negative.   Gastrointestinal: Negative.   Endocrine: Negative.   Genitourinary: Negative.   Musculoskeletal: Negative.   Skin: Negative.   Allergic/Immunologic: Negative.   Neurological: Negative.   Hematological: Negative.   Psychiatric/Behavioral: Negative.    All other systems reviewed and are negative.     Objective:   Physical Exam  MS: 5/5 in Ue's B/L  biceps, triceps, WE, grip and FA LE's- RLE 5/5 in HF/KE/KDF and PF LLE- HF 5-/5; KE/KF 5/5; DF 5-/5 and PF 4+ to 5-/5  Neuro: Intact to light touch in all 4 extremities No hoffman's no clonus; no spasms seen; no increased tone   Gait: Circumducts- LLE immediately- to get around DF/PF-  Good speed of walking. Mild foot slap, but very mild.      Assessment & Plan:   Pt is a 71 yr old male with R BG/corona radiata lacunar infarct in March 2023- with L hemiparesis; HTN, chronic thrombocytopenia and BPH and recent B/L carpal tunnel surgeries;  Here for f/u on Stroke.   Discussed Amantadine for energy levels if exhaustion doesn't pass.   2. I think some of circumduction- of L leg is due to a habit that's formed.  Work on slowly going through gait pattern- can hopefully focus on changing the gait.   3. Not sure he needs electro-stimulation- Bioness- is unlikely to be needed.   4. Work on thinking "is this needed, necessary, appropriate?" To help with the filter issues.    5. Focus on being the best, not the old you!   6. Doesn't need another handicapped  placard/sticker   7. F/U in 6 months   I spent a total of  43   minutes on total care today- >50% coordination of care- due to  discussion about prognosis.

## 2021-12-04 NOTE — Patient Instructions (Signed)
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   Discussed Amantadine for energy levels if exhaustion doesn't pass.   2. I think some of circumduction- of L leg is due to a habit that's formed.  Work on slowly going through gait pattern- can hopefully focus on changing the gait.   3. Not sure he needs electro-stimulation- Bioness- is unlikely to be needed.   4. Work on thinking "is this needed, necessary, appropriate?" To help with the filter issues.    5. Focus on being the best, not the old you!   6. Doesn't need another handicapped placard/sticker   7. F/U in 6 months

## 2021-12-08 ENCOUNTER — Institutional Professional Consult (permissible substitution): Payer: PRIVATE HEALTH INSURANCE | Admitting: Cardiology

## 2021-12-12 ENCOUNTER — Ambulatory Visit (INDEPENDENT_AMBULATORY_CARE_PROVIDER_SITE_OTHER): Payer: Medicare Other | Admitting: Internal Medicine

## 2021-12-12 ENCOUNTER — Encounter: Payer: Self-pay | Admitting: Internal Medicine

## 2021-12-12 ENCOUNTER — Telehealth: Payer: Self-pay | Admitting: Internal Medicine

## 2021-12-12 ENCOUNTER — Telehealth: Payer: Self-pay

## 2021-12-12 VITALS — BP 128/82 | HR 55 | Temp 97.9°F | Ht 70.0 in | Wt 182.2 lb

## 2021-12-12 DIAGNOSIS — I10 Essential (primary) hypertension: Secondary | ICD-10-CM

## 2021-12-12 DIAGNOSIS — I6381 Other cerebral infarction due to occlusion or stenosis of small artery: Secondary | ICD-10-CM | POA: Diagnosis not present

## 2021-12-12 DIAGNOSIS — N401 Enlarged prostate with lower urinary tract symptoms: Secondary | ICD-10-CM | POA: Diagnosis not present

## 2021-12-12 DIAGNOSIS — D539 Nutritional anemia, unspecified: Secondary | ICD-10-CM | POA: Diagnosis not present

## 2021-12-12 DIAGNOSIS — D696 Thrombocytopenia, unspecified: Secondary | ICD-10-CM | POA: Diagnosis not present

## 2021-12-12 DIAGNOSIS — R3912 Poor urinary stream: Secondary | ICD-10-CM

## 2021-12-12 DIAGNOSIS — R001 Bradycardia, unspecified: Secondary | ICD-10-CM | POA: Diagnosis not present

## 2021-12-12 DIAGNOSIS — Z1211 Encounter for screening for malignant neoplasm of colon: Secondary | ICD-10-CM | POA: Insufficient documentation

## 2021-12-12 DIAGNOSIS — E785 Hyperlipidemia, unspecified: Secondary | ICD-10-CM

## 2021-12-12 DIAGNOSIS — D508 Other iron deficiency anemias: Secondary | ICD-10-CM

## 2021-12-12 LAB — URINALYSIS, ROUTINE W REFLEX MICROSCOPIC
Bilirubin Urine: NEGATIVE
Hgb urine dipstick: NEGATIVE
Ketones, ur: NEGATIVE
Nitrite: NEGATIVE
Specific Gravity, Urine: <=1.005 — AB (ref 1.000–1.030)
Total Protein, Urine: NEGATIVE
Urine Glucose: NEGATIVE
Urobilinogen, UA: 0.2 (ref 0.0–1.0)
pH: 7 (ref 5.0–8.0)

## 2021-12-12 LAB — BASIC METABOLIC PANEL
BUN: 19 mg/dL (ref 6–23)
CO2: 29 mEq/L (ref 19–32)
Calcium: 9.2 mg/dL (ref 8.4–10.5)
Chloride: 105 mEq/L (ref 96–112)
Creatinine, Ser: 1.2 mg/dL (ref 0.40–1.50)
GFR: 61.39 mL/min (ref >60.00)
Glucose, Bld: 107 mg/dL — ABNORMAL HIGH (ref 70–99)
Potassium: 4.4 mEq/L (ref 3.5–5.1)
Sodium: 140 mEq/L (ref 135–145)

## 2021-12-12 LAB — PSA: PSA: 1.98 ng/mL (ref 0.10–4.00)

## 2021-12-12 LAB — CBC WITH DIFFERENTIAL/PLATELET
Basophils Absolute: 0 10*3/uL (ref 0.0–0.1)
Basophils Relative: 0.4 % (ref 0.0–3.0)
Eosinophils Absolute: 0.3 10*3/uL (ref 0.0–0.7)
Eosinophils Relative: 9.4 % — ABNORMAL HIGH (ref 0.0–5.0)
HCT: 37.9 % — ABNORMAL LOW (ref 39.0–52.0)
Hemoglobin: 13 g/dL (ref 13.0–17.0)
Lymphocytes Relative: 20.7 % (ref 12.0–46.0)
Lymphs Abs: 0.8 10*3/uL (ref 0.7–4.0)
MCHC: 34.3 g/dL (ref 30.0–36.0)
MCV: 100 fl (ref 78.0–100.0)
Monocytes Absolute: 0.2 10*3/uL (ref 0.1–1.0)
Monocytes Relative: 6.5 % (ref 3.0–12.0)
Neutro Abs: 2.3 10*3/uL (ref 1.4–7.7)
Neutrophils Relative %: 63 % (ref 43.0–77.0)
Platelets: 118 10*3/uL — ABNORMAL LOW (ref 150.0–400.0)
RBC: 3.79 Mil/uL — ABNORMAL LOW (ref 4.22–5.81)
RDW: 13.2 % (ref 11.5–15.5)
WBC: 3.7 10*3/uL — ABNORMAL LOW (ref 4.0–10.5)

## 2021-12-12 LAB — IBC + FERRITIN
Ferritin: 8 ng/mL — ABNORMAL LOW (ref 22.0–322.0)
Iron: 95 ug/dL (ref 42–165)
Saturation Ratios: 20.8 % (ref 20.0–50.0)
TIBC: 457.8 ug/dL — ABNORMAL HIGH (ref 250.0–450.0)
Transferrin: 327 mg/dL (ref 212.0–360.0)

## 2021-12-12 LAB — VITAMIN B12: Vitamin B-12: 522 pg/mL (ref 211–911)

## 2021-12-12 LAB — TSH: TSH: 2.22 u[IU]/mL (ref 0.35–5.50)

## 2021-12-12 LAB — FOLATE: Folate: >24.2 ng/mL (ref >5.9)

## 2021-12-12 MED ORDER — OLMESARTAN MEDOXOMIL 20 MG PO TABS: 10.0000 mg | ORAL_TABLET | Freq: Every day | ORAL | 1 refills | Status: DC

## 2021-12-12 MED ORDER — ACCRUFER 30 MG PO CAPS: 1.0000 | ORAL_CAPSULE | Freq: Two times a day (BID) | ORAL | 1 refills | Status: DC

## 2021-12-12 MED ORDER — TAMSULOSIN HCL 0.4 MG PO CAPS: 0.4000 mg | ORAL_CAPSULE | Freq: Every day | ORAL | 1 refills | Status: DC

## 2021-12-12 MED ORDER — FERROUS SULFATE 325 (65 FE) MG PO TABS: 325.0000 mg | ORAL_TABLET | Freq: Every day | ORAL | 0 refills | Status: DC

## 2021-12-12 MED ORDER — ROSUVASTATIN CALCIUM 20 MG PO TABS: 20.0000 mg | ORAL_TABLET | Freq: Every day | ORAL | 1 refills | Status: DC

## 2021-12-12 NOTE — Patient Instructions (Signed)

## 2021-12-12 NOTE — Telephone Encounter (Signed)
Pt states he is not understanding why the Ferric Maltol (ACCRUFER) 30 MG CAPS was sent in to [facility] as he has not discussed this medication with provider. Pt states he does not want to use [facility] pharmacy for anything as he uses [facility].  Pt wants to know what was his lab work results as well.

## 2021-12-12 NOTE — Telephone Encounter (Signed)
Pt had an appt today with Dr. Caryl Comes and was told to call back once he decided on if he wanted the loop recorder. Pt is requesting call back to make the appt for the loop recorder.

## 2021-12-12 NOTE — Progress Notes (Signed)
Subjective:  Patient ID: Eddie Calhoun, male    DOB: 11-21-1950  Age: 71 y.o. 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  He has residual but improving left upper and lower extremity weakness.  He is active and denies chest pain, shortness of breath, or edema.  History Eddie Calhoun has a past medical history of Allergy, BPH (benign prostatic hyperplasia), Elevated prostate specific antigen (PSA), Hypertension, Plantar fasciitis of right foot, Pneumonia, and Thrombocytopenia, unspecified ([place]) (07/26/2012).   He has no past surgical history on file.   His family history includes Cancer (age of onset: 75) in his father; Hypertension in his father and mother.He reports that he quit smoking about 34 years ago. His smoking use included cigarettes. He has a 40.00 pack-year smoking history. He has never used smokeless tobacco. He reports that he does not drink alcohol and does not use drugs.  Outpatient Medications Prior to Visit  Medication Sig Dispense Refill   acetaminophen (TYLENOL) 325 MG tablet Take 2 tablets (650 mg total) by mouth every 4 (four) hours as needed for mild pain (or temp > 37.5 C (99.5 F)). 30 tablet 0   aspirin EC 81 MG tablet 1 tablet     cholecalciferol (VITAMIN D3) 25 MCG (1000 UNIT) tablet Take 1 tablet (1,000 Units total) by mouth daily. 30 tablet 0   Multiple Vitamin (MULTIVITAMIN) tablet Take 1 tablet by mouth daily.       vitamin B-12 (CYANOCOBALAMIN) 1000 MCG tablet Take 1 tablet (1,000 mcg total) by mouth daily. 30 tablet 0   olmesartan (BENICAR) 20 MG tablet Take 10 mg by mouth daily.     rosuvastatin (CRESTOR) 20 MG tablet      tamsulosin (FLOMAX) 0.4 MG CAPS capsule Take 1 capsule (0.4 mg total) by mouth daily after supper. 30 capsule 0   No facility-administered medications prior to visit.    ROS Review of Systems  Constitutional: Negative.  Negative for diaphoresis and fatigue.  HENT:  Negative.  Negative for sore throat and trouble swallowing.   Eyes: Negative.  Negative for visual disturbance.  Respiratory:  Negative for cough, chest tightness, shortness of breath and wheezing.   Cardiovascular:  Negative for chest pain, palpitations and leg swelling.  Gastrointestinal:  Negative for abdominal pain, constipation, diarrhea, nausea and vomiting.  Endocrine: Negative.   Genitourinary: Negative.  Negative for difficulty urinating.  Musculoskeletal:  Positive for gait problem.  Skin: Negative.   Allergic/Immunologic: Negative.   Neurological:  Positive for weakness and numbness. Negative for dizziness and light-headedness.  Hematological:  Negative for adenopathy. Does not bruise/bleed easily.    Objective:  BP 128/82 (BP Location: Right Arm, Patient Position: Sitting, Cuff Size: Large)   Pulse (!) 55   Temp 97.9 F (36.6 C) (Oral)   Ht '5\' 10"'$  (1.778 m)   Wt 182 lb 4 oz (82.7 kg)   SpO2 96%   BMI 26.15 kg/m   Physical Exam Vitals reviewed.  HENT:     Nose: Nose normal.  Eyes:     General: No scleral icterus.    Conjunctiva/sclera: Conjunctivae normal.  Cardiovascular:     Rate and Rhythm: Regular rhythm. Bradycardia present.     Heart sounds: Normal heart sounds, S1 normal and S2 normal. No murmur heard.    Comments: EKG- SB, 45 Otherwise normal Pulmonary:     Effort: Pulmonary effort is normal.     Breath  sounds: No stridor. No wheezing, rhonchi or rales.  Abdominal:     General: Abdomen is flat.     Palpations: There is no mass.     Tenderness: There is no abdominal tenderness. There is no guarding.     Hernia: No hernia is present. There is no hernia in the left inguinal area or right inguinal area.  Genitourinary:    Pubic Area: No rash.      Penis: Normal and circumcised.      Testes: Normal.     Epididymis:     Right: Normal.     Left: Normal.     Prostate: Enlarged. Not tender and no nodules present.     Rectum: Guaiac result negative.  External hemorrhoid and internal hemorrhoid present. No mass, tenderness or anal fissure. Normal anal tone.  Musculoskeletal:     Cervical back: Neck supple.     Right lower leg: No edema.     Left lower leg: No edema.  Lymphadenopathy:     Cervical: No cervical adenopathy.     Lower Body: No right inguinal adenopathy. No left inguinal adenopathy.  Skin:    General: Skin is warm and dry.  Neurological:     Mental Status: He is alert. Mental status is at baseline.     Motor: Weakness present.     Gait: Gait abnormal.  Psychiatric:        Mood and Affect: Mood normal.     Lab Results  Component Value Date   WBC 3.7 (L) 12/12/2021   HGB 13.0 12/12/2021   HCT 37.9 (L) 12/12/2021   PLT 118.0 (L) 12/12/2021   GLUCOSE 107 (H) 12/12/2021   CHOL 136 07/02/2021   TRIG 65 07/02/2021   HDL 44 07/02/2021   LDLCALC 79 07/02/2021   ALT 19 07/10/2021   AST 19 07/10/2021   NA 140 12/12/2021   K 4.4 12/12/2021   CL 105 12/12/2021   CREATININE 1.20 12/12/2021   BUN 19 12/12/2021   CO2 29 12/12/2021   TSH 2.22 12/12/2021   PSA 1.98 12/12/2021   INR 0.9 07/01/2021   HGBA1C 5.5 07/02/2021     Assessment & Plan:   Eddie Calhoun was seen today for anemia.  Diagnoses and all orders for this visit:  Deficiency anemia- His H&H have improved.  Will screen for vitamin deficiencies. -     Vitamin B1; Future -     Zinc; Future -     CBC with Differential/Platelet; Future -     Folate; Future -     Vitamin B12; Future -     IBC + Ferritin; Future -     Reticulocytes; Future -     Basic metabolic panel; Future -     Basic metabolic panel -     Reticulocytes -     IBC + Ferritin -     Vitamin B12 -     Folate -     CBC with Differential/Platelet -     Zinc -     Vitamin B1  Thrombocytopenia, unspecified (H[facility])- There is no history of bleeding or bruising.  Will evaluate for secondary causes. -     Vitamin B1; Future -     Zinc; Future -     CBC with Differential/Platelet; Future -      Folate; Future -     Vitamin B12; Future -     Basic metabolic panel; Future -     Basic metabolic panel -  Vitamin B12 -     Folate -     CBC with Differential/Platelet -     Zinc -     Vitamin B1  Benign prostatic hyperplasia with weak urinary stream- His PSA is normal.  Symptoms are well controlled. -     PSA; Future -     Urinalysis, Routine w reflex microscopic; Future -     Basic metabolic panel; Future -     tamsulosin (FLOMAX) 0.4 MG CAPS capsule; Take 1 capsule (0.4 mg total) by mouth daily after supper. -     Basic metabolic panel -     Urinalysis, Routine w reflex microscopic -     PSA  Bradycardia- He is asymptomatic with this.  Labs are negative for secondary causes. -     TSH; Future -     Basic metabolic panel; Future -     EKG 12-Lead -     Basic metabolic panel -     TSH  Screen for colon cancer -     Ambulatory referral to Gastroenterology  Dyslipidemia, goal LDL below 70 -     rosuvastatin (CRESTOR) 20 MG tablet; Take 1 tablet (20 mg total) by mouth daily. -     Lipid panel; Future  Primary hypertension -     olmesartan (BENICAR) 20 MG tablet; Take 0.5 tablets (10 mg total) by mouth daily.  Iron deficiency anemia secondary to inadequate dietary iron intake -     Discontinue: Ferric Maltol (ACCRUFER) 30 MG CAPS; Take 1 capsule by mouth in the morning and at bedtime. -     ferrous sulfate 325 (65 FE) MG tablet; Take 1 tablet (325 mg total) by mouth daily with breakfast.   I have discontinued Eddie Dibbles C. Dalton's ACCRUFeR. I have also changed his rosuvastatin and olmesartan. Additionally, I am having him start on ferrous sulfate. Lastly, I am having him maintain his multivitamin, acetaminophen, cholecalciferol, cyanocobalamin, aspirin EC, and tamsulosin.  Meds ordered this encounter  Medications   rosuvastatin (CRESTOR) 20 MG tablet    Sig: Take 1 tablet (20 mg total) by mouth daily.    Dispense:  90 tablet    Refill:  1   olmesartan (BENICAR) 20 MG  tablet    Sig: Take 0.5 tablets (10 mg total) by mouth daily.    Dispense:  90 tablet    Refill:  1   tamsulosin (FLOMAX) 0.4 MG CAPS capsule    Sig: Take 1 capsule (0.4 mg total) by mouth daily after supper.    Dispense:  90 capsule    Refill:  1   DISCONTD: Ferric Maltol (ACCRUFER) 30 MG CAPS    Sig: Take 1 capsule by mouth in the morning and at bedtime.    Dispense:  90 capsule    Refill:  1   ferrous sulfate 325 (65 FE) MG tablet    Sig: Take 1 tablet (325 mg total) by mouth daily with breakfast.    Dispense:  90 tablet    Refill:  0     Follow-up: Return in about 6 months (around 06/14/2022).  Scarlette Calico, MD

## 2021-12-12 NOTE — Patient Instructions (Addendum)
Medication Instructions:  Your physician recommends that you continue on your current medications as directed. Please refer to the Current Medication list given to you today.  *If you need a refill on your cardiac medications before your next appointment, please call your pharmacy*  NO CHANGES TO [place]  Lab Work: None ordered.  If you have labs (blood work) drawn today and your tests are completely normal, you will receive your results only by: [place] (if you have MyChart) OR A paper copy in the mail If you have any lab test that is abnormal or we need to change your treatment, we will call you to review the results.  Testing/Procedures: None ordered.  Follow-Up: AS NEEDED, patient will contact [facility] / Dr Tanna Furry nurse when makes a decision regarding Loop Recorder.     We recommend signing up for the patient portal called "MyChart".  Sign up information is provided on this After Visit Summary.  MyChart is used to connect with patients for Virtual Visits (Telemedicine).  Patients are able to view lab/test results, encounter notes, upcoming appointments, etc.  Non-urgent messages can be sent to your provider as well.   To learn more about what you can do with MyChart, go to [email or web address].     Provider:   Cristopher Peru, MD{or one of the following Advanced Practice Providers on your designated Care Team:   Tommye Standard, [place] Legrand Como "Jonni Sanger" Chalmers Cater, [place]    Important Information About Sugar

## 2021-12-12 NOTE — Progress Notes (Signed)
HPI [name] is referred today by Dr. 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 He had a stroke over a year ago. He has worn a cardiac monitor which did not show any atrial fib. He has HTN and asymptomatic sinus node dysfunction. No syncope. He denies chest pain or sob and is active exercising regularly. No other neuro symptoms. He appears to have had a nice recovery from his stroke.  Allergies  Allergen Reactions   Contrast Media [Iodinated Contrast Media] Hives   Dutasteride Other (See Comments)    Other reaction(s): itching Other reaction(s): itching   Iodine Other (See Comments)    Other reaction(s): I iodinated contrast dyes Other reaction(s): I iodinated contrast dyes     Current Outpatient Medications  Medication Sig Dispense Refill   acetaminophen (TYLENOL) 325 MG tablet Take 2 tablets (650 mg total) by mouth every 4 (four) hours as needed for mild pain (or temp > 37.5 C (99.5 F)). 30 tablet 0   aspirin EC 81 MG tablet 1 tablet     cholecalciferol (VITAMIN D3) 25 MCG (1000 UNIT) tablet Take 1 tablet (1,000 Units total) by mouth daily. 30 tablet 0   Multiple Vitamin (MULTIVITAMIN) tablet Take 1 tablet by mouth daily.       olmesartan (BENICAR) 20 MG tablet Take 0.5 tablets (10 mg total) by mouth daily. 90 tablet 1   rosuvastatin (CRESTOR) 20 MG tablet Take 1 tablet (20 mg total) by mouth daily. 90 tablet 1   tamsulosin (FLOMAX) 0.4 MG CAPS capsule Take 1 capsule (0.4 mg total) by mouth daily after supper. 90 capsule 1   vitamin B-12 (CYANOCOBALAMIN) 1000 MCG tablet Take 1 tablet (1,000 mcg total) by mouth daily. 30 tablet 0   Ferric Maltol (ACCRUFER) 30 MG CAPS Take 1 capsule by mouth in the morning and at bedtime. 90 capsule 1   No current facility-administered medications for this visit.     Past Medical History:  Diagnosis Date   Allergy    allergic rhinitis   BPH (benign prostatic hyperplasia)    Elevated prostate specific  antigen (PSA)    Hypertension    Plantar fasciitis of right foot    Pneumonia    Thrombocytopenia, unspecified ([place]) 07/26/2012   03/24/12  129,000!    ROS:   All systems reviewed and negative except as noted in the HPI.   History reviewed. No pertinent surgical history.   Family History  Problem Relation Age of Onset   Hypertension Mother    Hypertension Father    Cancer Father 61       father died with hx of prostate cancer     Social History   Socioeconomic History   Marital status: Married    Spouse name: Not on file   Number of children: Not on file   Years of education: Not on file   Highest education level: Not on file  Occupational History   Not on file  Tobacco Use   Smoking status: Former    Packs/day: 2.00    Years: 20.00    Total pack years: 40.00    Types: Cigarettes    Quit date: 09/21/1987    Years since quitting: 34.2   Smokeless tobacco: Never  Vaping Use   Vaping Use: Never used  Substance and Sexual Activity   Alcohol use: No   Drug use: Never   Sexual activity: Not Currently  Other Topics Concern   Not  on file  Social History Narrative   Not on file   Social Determinants of Health   Financial Resource Strain: Not on file  Food Insecurity: Not on file  Transportation Needs: Not on file  Physical Activity: Not on file  Stress: Not on file  Social Connections: Not on file  Intimate Partner Violence: Not on file     BP 130/70   Pulse 67   Ht '5\' 10"'$  (1.778 m)   Wt 182 lb (82.6 kg)   SpO2 96%   BMI 26.11 kg/m   Physical Exam:  Well appearing NAD HEENT: Unremarkable Neck:  No JVD, no thyromegally Lymphatics:  No adenopathy Back:  No CVA tenderness Lungs:  Clear with no wheezes HEART:  Regular rate rhythm, no murmurs, no rubs, no clicks Abd:  soft, positive bowel sounds, no organomegally, no rebound, no guarding Ext:  2 plus pulses, no edema, no cyanosis, no clubbing Skin:  No rashes no nodules Neuro:  CN II through XII  intact, motor grossly intact  EKG - nsr   Assess/Plan:  Cryptogenic stroke -I have reviewed the treatment options with the patient. He is currently not inclined to pursue ILR insertion but will call us if he changes his mind.  HTN - his bp is controlled. No change in meds.   Eddie Calhoun Dhana Totton,MD

## 2021-12-12 NOTE — Telephone Encounter (Signed)
Pt rescheduled for October for loop implant.

## 2021-12-13 ENCOUNTER — Encounter: Payer: Self-pay | Admitting: Internal Medicine

## 2021-12-15 ENCOUNTER — Other Ambulatory Visit: Payer: Self-pay | Admitting: Internal Medicine

## 2021-12-15 DIAGNOSIS — E785 Hyperlipidemia, unspecified: Secondary | ICD-10-CM

## 2021-12-15 LAB — ZINC: Zinc: 69 ug/dL (ref 60–130)

## 2021-12-15 LAB — VITAMIN B1: Vitamin B1 (Thiamine): 14 nmol/L (ref 8–30)

## 2021-12-15 LAB — RETICULOCYTES
ABS Retic: 58050 cells/uL (ref 25000–90000)
Retic Ct Pct: 1.5 %

## 2021-12-17 ENCOUNTER — Encounter: Payer: Self-pay | Admitting: Physical Medicine and Rehabilitation

## 2021-12-18 ENCOUNTER — Other Ambulatory Visit (INDEPENDENT_AMBULATORY_CARE_PROVIDER_SITE_OTHER): Payer: Medicare Other

## 2021-12-18 DIAGNOSIS — E785 Hyperlipidemia, unspecified: Secondary | ICD-10-CM

## 2021-12-18 LAB — LIPID PANEL
Cholesterol: 132 mg/dL (ref 0–200)
HDL: 53.7 mg/dL (ref >39.00)
LDL Cholesterol: 63 mg/dL (ref 0–99)
NonHDL: 78.11
Total CHOL/HDL Ratio: 2
Triglycerides: 78 mg/dL (ref 0.0–149.0)
VLDL: 15.6 mg/dL (ref 0.0–40.0)

## 2021-12-21 ENCOUNTER — Encounter: Payer: Self-pay | Admitting: Gastroenterology

## 2021-12-25 ENCOUNTER — Ambulatory Visit: Payer: Medicare Other | Admitting: Neurology

## 2022-01-09 ENCOUNTER — Ambulatory Visit: Payer: Medicare Other | Admitting: Neurology

## 2022-01-23 ENCOUNTER — Ambulatory Visit (INDEPENDENT_AMBULATORY_CARE_PROVIDER_SITE_OTHER): Payer: Medicare Other | Admitting: Internal Medicine

## 2022-01-23 ENCOUNTER — Encounter: Payer: Self-pay | Admitting: Internal Medicine

## 2022-01-23 VITALS — BP 122/78 | HR 50 | Temp 98.0°F | Ht 70.0 in | Wt 181.0 lb

## 2022-01-23 DIAGNOSIS — D696 Thrombocytopenia, unspecified: Secondary | ICD-10-CM | POA: Diagnosis not present

## 2022-01-23 DIAGNOSIS — Z1159 Encounter for screening for other viral diseases: Secondary | ICD-10-CM | POA: Insufficient documentation

## 2022-01-23 DIAGNOSIS — D508 Other iron deficiency anemias: Secondary | ICD-10-CM | POA: Diagnosis not present

## 2022-01-23 DIAGNOSIS — I6381 Other cerebral infarction due to occlusion or stenosis of small artery: Secondary | ICD-10-CM | POA: Diagnosis not present

## 2022-01-23 LAB — CBC WITH DIFFERENTIAL/PLATELET
Basophils Absolute: 0 10*3/uL (ref 0.0–0.1)
Basophils Relative: 0.5 % (ref 0.0–3.0)
Eosinophils Absolute: 0.2 10*3/uL (ref 0.0–0.7)
Eosinophils Relative: 6.8 % — ABNORMAL HIGH (ref 0.0–5.0)
HCT: 38.2 % — ABNORMAL LOW (ref 39.0–52.0)
Hemoglobin: 13 g/dL (ref 13.0–17.0)
Lymphocytes Relative: 18.8 % (ref 12.0–46.0)
Lymphs Abs: 0.7 10*3/uL (ref 0.7–4.0)
MCHC: 34.1 g/dL (ref 30.0–36.0)
MCV: 101.8 fl — ABNORMAL HIGH (ref 78.0–100.0)
Monocytes Absolute: 0.3 10*3/uL (ref 0.1–1.0)
Monocytes Relative: 8.3 % (ref 3.0–12.0)
Neutro Abs: 2.4 10*3/uL (ref 1.4–7.7)
Neutrophils Relative %: 65.6 % (ref 43.0–77.0)
Platelets: 115 10*3/uL — ABNORMAL LOW (ref 150.0–400.0)
RBC: 3.75 Mil/uL — ABNORMAL LOW (ref 4.22–5.81)
RDW: 13.3 % (ref 11.5–15.5)
WBC: 3.7 10*3/uL — ABNORMAL LOW (ref 4.0–10.5)

## 2022-01-23 LAB — IBC + FERRITIN
Ferritin: 14.8 ng/mL — ABNORMAL LOW (ref 22.0–322.0)
Iron: 96 ug/dL (ref 42–165)
Saturation Ratios: 26.2 % (ref 20.0–50.0)
TIBC: 366.8 ug/dL (ref 250.0–450.0)
Transferrin: 262 mg/dL (ref 212.0–360.0)

## 2022-01-23 NOTE — Progress Notes (Signed)
Subjective:  Patient ID: Eddie Calhoun, male    DOB: [birth year]  Age: 71 y.o. MRN: [id number]  CC: Anemia   HPI Eddie Calhoun presents for f/up -  His stroke symptoms are stable.  He has been experiencing nerve pain in his left lower extremity so he has been taking his wife's supply of gabapentin.  He denies chest pain, shortness of breath, diaphoresis, or edema.  Outpatient Medications Prior to Visit  Medication Sig Dispense Refill   ACCRUFER 30 MG CAPS SMARTSIG:1 Capsule(s) By Mouth Morning-Night     acetaminophen (TYLENOL) 325 MG tablet Take 2 tablets (650 mg total) by mouth every 4 (four) hours as needed for mild pain (or temp > 37.5 C (99.5 F)). 30 tablet 0   aspirin EC 81 MG tablet 1 tablet     cholecalciferol (VITAMIN D3) 25 MCG (1000 UNIT) tablet Take 1 tablet (1,000 Units total) by mouth daily. 30 tablet 0   ferrous sulfate 325 (65 FE) MG tablet Take 1 tablet (325 mg total) by mouth daily with breakfast. 90 tablet 0   Multiple Vitamin (MULTIVITAMIN) tablet Take 1 tablet by mouth daily.       olmesartan (BENICAR) 20 MG tablet Take 0.5 tablets (10 mg total) by mouth daily. 90 tablet 1   rosuvastatin (CRESTOR) 20 MG tablet Take 1 tablet (20 mg total) by mouth daily. 90 tablet 1   tamsulosin (FLOMAX) 0.4 MG CAPS capsule Take 1 capsule (0.4 mg total) by mouth daily after supper. 90 capsule 1   vitamin B-12 (CYANOCOBALAMIN) 1000 MCG tablet Take 1 tablet (1,000 mcg total) by mouth daily. 30 tablet 0   No facility-administered medications prior to visit.    ROS Review of Systems  Constitutional: Negative.  Negative for diaphoresis and fatigue.  HENT: Negative.    Eyes: Negative.   Respiratory:  Negative for cough, chest tightness, shortness of breath and wheezing.   Cardiovascular:  Negative for chest pain, palpitations and leg swelling.  Gastrointestinal:  Negative for abdominal pain, constipation, diarrhea and nausea.  Endocrine: Negative.   Genitourinary: Negative.   Negative for difficulty urinating.  Musculoskeletal: Negative.   Skin: Negative.  Negative for color change.  Neurological:  Positive for weakness. Negative for dizziness.  Psychiatric/Behavioral: Negative.      Objective:  BP 122/78 (BP Location: Left Arm, Patient Position: Sitting, Cuff Size: Normal)   Pulse (!) 50   Temp 98 F (36.7 C) (Oral)   Ht '5\' 10"'$  (1.778 m)   Wt 181 lb (82.1 kg)   SpO2 99%   BMI 25.97 kg/m   BP Readings from Last 3 Encounters:  01/23/22 122/78  12/12/21 130/70  12/12/21 128/82    Wt Readings from Last 3 Encounters:  01/23/22 181 lb (82.1 kg)  12/12/21 182 lb (82.6 kg)  12/12/21 182 lb 4 oz (82.7 kg)    Physical Exam Vitals reviewed.  HENT:     Nose: Nose normal.     Mouth/Throat:     Mouth: Mucous membranes are moist.  Eyes:     General: No scleral icterus.    Conjunctiva/sclera: Conjunctivae normal.  Cardiovascular:     Rate and Rhythm: Normal rate and regular rhythm.     Heart sounds: No murmur heard. Pulmonary:     Effort: Pulmonary effort is normal.     Breath sounds: No stridor. No wheezing, rhonchi or rales.  Abdominal:     General: Abdomen is flat.     Palpations: There is no mass.  Tenderness: There is no abdominal tenderness. There is no guarding.     Hernia: No hernia is present.  Musculoskeletal:        General: Normal range of motion.     Cervical back: Neck supple.     Right lower leg: No edema.     Left lower leg: No edema.  Lymphadenopathy:     Cervical: No cervical adenopathy.  Skin:    General: Skin is warm and dry.  Neurological:     Mental Status: He is alert. Mental status is at baseline.     Lab Results  Component Value Date   WBC 3.7 (L) 01/23/2022   HGB 13.0 01/23/2022   HCT 38.2 (L) 01/23/2022   PLT 115.0 (L) 01/23/2022   GLUCOSE 107 (H) 12/12/2021   CHOL 132 12/18/2021   TRIG 78.0 12/18/2021   HDL 53.70 12/18/2021   LDLCALC 63 12/18/2021   ALT 19 07/10/2021   AST 19 07/10/2021   NA 140  12/12/2021   K 4.4 12/12/2021   CL 105 12/12/2021   CREATININE 1.20 12/12/2021   BUN 19 12/12/2021   CO2 29 12/12/2021   TSH 2.22 12/12/2021   PSA 1.98 12/12/2021   INR 0.9 07/01/2021   HGBA1C 5.5 07/02/2021    No results found.  Assessment & Plan:   Eddie Calhoun was seen today for anemia.  Diagnoses and all orders for this visit:  Need for hepatitis C screening test -     Hepatitis C antibody; Future -     Hepatitis C antibody  Thrombocytopenia, unspecified ([place])- His platelet count is stable. -     CBC with Differential/Platelet; Future -     CBC with Differential/Platelet  Iron deficiency anemia secondary to inadequate dietary iron intake- His H&H and iron level have improved. -     CBC with Differential/Platelet; Future -     IBC + Ferritin; Future -     IBC + Ferritin -     CBC with Differential/Platelet   I am having Eddie Calhoun maintain his multivitamin, acetaminophen, cholecalciferol, cyanocobalamin, aspirin EC, rosuvastatin, olmesartan, tamsulosin, ferrous sulfate, and ACCRUFeR.  No orders of the defined types were placed in this encounter.    Follow-up: Return in about 6 months (around 07/24/2022).  Scarlette Calico, MD

## 2022-01-23 NOTE — Patient Instructions (Signed)
Iron Deficiency Anemia, Adult  Iron deficiency anemia is a condition in which the concentration of red blood cells or hemoglobin in the blood is below normal because of too little iron. Hemoglobin is a substance in red blood cells that carries oxygen to the body's tissues. When the concentration of red blood cells or hemoglobin is too low, not enough oxygen reaches these tissues. Iron deficiency anemia is usually long-lasting, and it develops over time. It may or may not cause symptoms. It is a common type of anemia. What are the causes? This condition may be caused by: Not enough iron in the diet. Abnormal absorption in the gut. Blood loss. What increases the risk? You are more likely to develop this condition if you get menstrual periods (menstruate) or are pregnant. What are the signs or symptoms? Symptoms of this condition may include: Pale skin, lips, and nail beds. Weakness, dizziness, and getting tired easily. Shortness of breath when moving or exercising. Cold hands or feet. Mild anemia may not cause any symptoms. How is this diagnosed? This condition is diagnosed based on: Your medical history. A physical exam. Blood tests. How is this treated? This condition is treated by correcting the cause of your iron deficiency. Treatment may involve: Adding iron-rich foods to your diet. Taking iron supplements. If you are pregnant or breastfeeding, you may need to take extra iron because your normal diet usually does not provide the amount of iron that you need. Increasing vitamin C intake. Vitamin C helps your body absorb iron. Your health care provider may recommend that you take iron supplements along with a glass of orange juice or a vitamin C supplement. Medicines to make heavy menstrual flow lighter. Surgery or additional testing procedures to determine the cause of your anemia. You may need repeat blood tests to determine whether treatment is working. If the treatment does not  seem to be working, you may need more tests. Follow these instructions at home: Medicines Take over-the-counter and prescription medicines only as told by your health care provider. This includes iron supplements and vitamins. This is important because too much iron can be harmful. For the best iron absorption, you should take iron supplements when your stomach is empty. If you cannot tolerate them on an empty stomach, you may need to take them with food. Do not drink milk or take antacids at the same time as your iron supplements. Milk and antacids may interfere with how your body absorbs iron. Iron supplements may turn stool (feces) a darker color and it may appear black. If you cannot tolerate taking iron supplements by mouth, talk with your health care provider about taking them through an IV or through an injection into a muscle. Eating and drinking Talk with your health care provider before changing your diet. Your provider may recommend that you eat foods that contain a lot of iron, such as: Liver. Low-fat (lean) beef. Breads and cereals that have iron added to them (are fortified). Eggs. Dried fruit. Dark green, leafy vegetables. To help your body use the iron from iron-rich foods, eat those foods at the same time as fresh fruits and vegetables that are high in vitamin C. Foods that are high in vitamin C include: Oranges. Peppers. Tomatoes. Mangoes. Managing constipation If you are taking an iron supplement, it may cause constipation. To prevent or treat constipation, you may need to: Drink enough fluid to keep your urine pale yellow. Take over-the-counter or prescription medicines. Eat foods that are high in fiber, such   as beans, whole grains, and fresh fruits and vegetables. Limit foods that are high in fat and processed sugars, such as fried or sweet foods. General instructions Return to your normal activities as told by your health care provider. Ask your health care provider  what activities are safe for you. Keep all follow-up visits. Contact a health care provider if: You feel nauseous or you vomit. You feel weak. You become light-headed when getting up from a sitting or lying down position. You have unexplained sweating. You develop symptoms of constipation. You have a heaviness in your chest. You have trouble breathing with physical activity. Get help right away if: You faint. If this happens, do not drive yourself to the hospital. You have an irregular or rapid heartbeat. Summary Iron deficiency anemia is a condition in which the concentration of red blood cells or hemoglobin in the blood is below normal because of too little iron. This condition is treated by correcting the cause of your iron deficiency. Take over-the-counter and prescription medicines only as told by your health care provider. This includes iron supplements and vitamins. To help your body use the iron from iron-rich foods, eat those foods at the same time as fresh fruits and vegetables that are high in vitamin C. Seek medical help if you have signs or symptoms of worsening anemia. This information is not intended to replace advice given to you by your health care provider. Make sure you discuss any questions you have with your health care provider. Document Revised: 05/31/2021 Document Reviewed: 05/31/2021 Elsevier Patient Education  2023 Elsevier Inc.  

## 2022-01-24 LAB — HEPATITIS C ANTIBODY: Hepatitis C Ab: NONREACTIVE

## 2022-02-04 DIAGNOSIS — Z95818 Presence of other cardiac implants and grafts: Secondary | ICD-10-CM

## 2022-02-04 HISTORY — DX: Presence of other cardiac implants and grafts: Z95.818

## 2022-02-05 ENCOUNTER — Encounter: Payer: Self-pay | Admitting: Physical Medicine and Rehabilitation

## 2022-02-06 ENCOUNTER — Ambulatory Visit (AMBULATORY_SURGERY_CENTER): Payer: Self-pay

## 2022-02-06 VITALS — Ht 70.0 in | Wt 184.0 lb

## 2022-02-06 DIAGNOSIS — Z1211 Encounter for screening for malignant neoplasm of colon: Secondary | ICD-10-CM

## 2022-02-06 MED ORDER — NA SULFATE-K SULFATE-MG SULF 17.5-3.13-1.6 GM/177ML PO SOLN: 1.0000 | ORAL | 0 refills | Status: DC

## 2022-02-06 NOTE — Progress Notes (Signed)
No egg or soy allergy known to patient  No issues known to pt with past sedation with any surgeries or procedures Patient denies ever being told they had issues or difficulty with intubation  No FH of Malignant Hyperthermia Pt is not on diet pills Pt is not on  home 02  Pt is not on blood thinners  Pt denies issues with constipation  No A fib or A flutter Have any cardiac testing pending--denied  Loop monitor scheduled for Thursday 02/08/22 Pt instructed to use [email or web address] or GoodRx for a price reduction on prep

## 2022-02-07 MED ORDER — GABAPENTIN 400 MG PO CAPS: 400.0000 mg | ORAL_CAPSULE | Freq: Three times a day (TID) | ORAL | 5 refills | Status: DC

## 2022-02-08 ENCOUNTER — Ambulatory Visit: Payer: Medicare Other | Attending: Internal Medicine | Admitting: Internal Medicine

## 2022-02-08 ENCOUNTER — Telehealth (INDEPENDENT_AMBULATORY_CARE_PROVIDER_SITE_OTHER): Payer: PRIVATE HEALTH INSURANCE

## 2022-02-08 VITALS — BP 104/62 | HR 94 | Ht 70.0 in | Wt 180.0 lb

## 2022-02-08 DIAGNOSIS — I639 Cerebral infarction, unspecified: Secondary | ICD-10-CM | POA: Insufficient documentation

## 2022-02-08 DIAGNOSIS — I6381 Other cerebral infarction due to occlusion or stenosis of small artery: Secondary | ICD-10-CM | POA: Diagnosis not present

## 2022-02-08 DIAGNOSIS — I10 Essential (primary) hypertension: Secondary | ICD-10-CM | POA: Diagnosis not present

## 2022-02-08 DIAGNOSIS — Z1211 Encounter for screening for malignant neoplasm of colon: Secondary | ICD-10-CM

## 2022-02-08 MED ORDER — NA SULFATE-K SULFATE-MG SULF 17.5-3.13-1.6 GM/177ML PO SOLN: 1.0000 | ORAL | 0 refills | Status: DC

## 2022-02-08 NOTE — Patient Instructions (Addendum)
Medication Instructions:  Your physician recommends that you continue on your current medications as directed. Please refer to the Current Medication list given to you today.  Labwork: None ordered.  Testing/Procedures: None ordered.  Follow-Up:  Your physician wants you to follow-up in: one year with Dr. Gregg Taylor.  You will receive a reminder letter in the mail two months in advance. If you don't receive a letter, please call our office to schedule the follow-up appointment.    Implantable Loop Recorder Placement, Care After This sheet gives you information about how to care for yourself after your procedure. Your health care provider may also give you more specific instructions. If you have problems or questions, contact your health care provider. What can I expect after the procedure? After the procedure, it is common to have: Soreness or discomfort near the incision. Some swelling or bruising near the incision.  Follow these instructions at home: Incision care  Monitor your cardiac device site for redness, swelling, and drainage. Call the device clinic at [phone] if you experience these symptoms or fever/chills.  Keep the large square bandage on your site for 24 hours and then you may remove it yourself. Keep the steri-strips underneath in place.   You may shower after 72 hours / 3 days from your procedure with the steri-strips in place. They will usually fall off on their own, or may be removed after 10 days. Pat dry.   Avoid lotions, ointments, or perfumes over your incision until it is well-healed.  Please do not submerge in water until your site is completely healed.   Your device is MRI compatible.   Remote monitoring is used to monitor your cardiac device from home. This monitoring is scheduled every month by our office. It allows us to keep an eye on the function of your device to ensure it is working properly.  If your wound site starts to bleed apply  pressure.    For help with the monitor please call Medtronic Monitor Support Specialist directly at [phone].    If you have any questions/concerns please call the device clinic at [phone].  Activity  Return to your normal activities.  General instructions Follow instructions from your health care provider about how to manage your implantable loop recorder and transmit the information. Learn how to activate a recording if this is necessary for your type of device. You may go through a metal detection gate, and you may let someone hold a metal detector over your chest. Show your ID card if needed. Do not have an MRI unless you check with your health care provider first. Take over-the-counter and prescription medicines only as told by your health care provider. Keep all follow-up visits as told by your health care provider. This is important. Contact a health care provider if: You have redness, swelling, or pain around your incision. You have a fever. You have pain that is not relieved by your pain medicine. You have triggered your device because of fainting (syncope) or because of a heartbeat that feels like it is racing, slow, fluttering, or skipping (palpitations). Get help right away if you have: Chest pain. Difficulty breathing. Summary After the procedure, it is common to have soreness or discomfort near the incision. Change your dressing as told by your health care provider. Follow instructions from your health care provider about how to manage your implantable loop recorder and transmit the information. Keep all follow-up visits as told by your health care provider. This is important. This   information is not intended to replace advice given to you by your health care provider. Make sure you discuss any questions you have with your health care provider. Document Released: 04/04/2015 Document Revised: 06/08/2017 Document Reviewed: 06/08/2017 Elsevier Patient Education   2020 Elsevier Inc.       

## 2022-02-08 NOTE — Telephone Encounter (Signed)
Inbound call from patient states the suprep is too expensive for him. Would like alternative sent to [facility] in [place]. Please advise, thank you.

## 2022-02-08 NOTE — Telephone Encounter (Signed)
Patient called and now states he needs suprep sent to [facility] in [place], has coupon for $29.99.

## 2022-02-08 NOTE — Telephone Encounter (Signed)
Returned call. Voicemail. Will offer Miralax (not Rx) or Golytely(larger volume) when he calls back.

## 2022-02-08 NOTE — Telephone Encounter (Signed)
Spoke with pt. New Suprep Rx sent to [facility].

## 2022-02-08 NOTE — Progress Notes (Signed)
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 He had a stroke over a year ago. He has worn a cardiac monitor which did not show any atrial fib. He has HTN and asymptomatic sinus node dysfunction. No syncope. He denies chest pain or sob and is active exercising regularly. No other neuro symptoms. He appears to have had a nice recovery from his stroke. He is interested in proceeding with ILR insertion. Allergies  Allergen Reactions   Contrast Media [Iodinated Contrast Media] Hives   Dutasteride Other (See Comments)    Other reaction(s): itching Other reaction(s): itching     Current Outpatient Medications  Medication Sig Dispense Refill   acetaminophen (TYLENOL) 325 MG tablet Take 2 tablets (650 mg total) by mouth every 4 (four) hours as needed for mild pain (or temp > 37.5 C (99.5 F)). 30 tablet 0   aspirin EC 81 MG tablet 1 tablet     cholecalciferol (VITAMIN D3) 25 MCG (1000 UNIT) tablet Take 1 tablet (1,000 Units total) by mouth daily. 30 tablet 0   ferrous sulfate 325 (65 FE) MG tablet Take 1 tablet (325 mg total) by mouth daily with breakfast. 90 tablet 0   gabapentin (NEURONTIN) 400 MG capsule Take 1 capsule (400 mg total) by mouth 3 (three) times daily. For nerve pain (Patient taking differently: Take 400 mg by mouth 2 (two) times daily. For nerve pain) 90 capsule 5   Multiple Vitamin (MULTIVITAMIN) tablet Take 1 tablet by mouth daily.       Na Sulfate-K Sulfate-Mg Sulf 17.5-3.13-1.6 GM/177ML SOLN Take 1 kit by mouth as directed. May use generic Suprep, no prior authorization. Take as directed. 354 mL 0   olmesartan (BENICAR) 20 MG tablet Take 0.5 tablets (10 mg total) by mouth daily. 90 tablet 1   rosuvastatin (CRESTOR) 20 MG tablet Take 1 tablet (20 mg total) by mouth daily. 90 tablet 1   tamsulosin (FLOMAX) 0.4 MG CAPS capsule Take 1 capsule (0.4 mg total) by mouth daily after supper. 90 capsule 1   vitamin B-12  (CYANOCOBALAMIN) 1000 MCG tablet Take 1 tablet (1,000 mcg total) by mouth daily. 30 tablet 0   No current facility-administered medications for this visit.     Past Medical History:  Diagnosis Date   Allergy    allergic rhinitis   BPH (benign prostatic hyperplasia)    Elevated prostate specific antigen (PSA)    Hypertension    Plantar fasciitis of right foot    Pneumonia    Thrombocytopenia, unspecified ([place]) 07/26/2012   03/24/12  129,000!    ROS:   All systems reviewed and negative except as noted in the HPI.   Past Surgical History:  Procedure Laterality Date   CARPAL TUNNEL RELEASE Bilateral 04/2021     Family History  Problem Relation Age of Onset   Hypertension Mother    Colon cancer Father 17   Hypertension Father    Cancer Father 65       father died with hx of prostate cancer   Esophageal cancer Neg Hx    Rectal cancer Neg Hx    Stomach cancer Neg Hx      Social History   Socioeconomic History   Marital status: Married    Spouse name: Not on file   Number of children: Not on file   Years of education: Not on file   Highest education level: Not on file  Occupational History  Not on file  Tobacco Use   Smoking status: Former    Packs/day: 2.00    Years: 20.00    Total pack years: 40.00    Types: Cigarettes    Quit date: 09/21/1987    Years since quitting: 34.4   Smokeless tobacco: Never  Vaping Use   Vaping Use: Never used  Substance and Sexual Activity   Alcohol use: Yes    Comment: social   Drug use: Never   Sexual activity: Not Currently  Other Topics Concern   Not on file  Social History Narrative   Not on file   Social Determinants of Health   Financial Resource Strain: Not on file  Food Insecurity: Not on file  Transportation Needs: Not on file  Physical Activity: Not on file  Stress: Not on file  Social Connections: Not on file  Intimate Partner Violence: Not on file     BP 104/62   Pulse 94   Ht 5' 10" (1.778 m)    Wt 180 lb (81.6 kg)   SpO2 94%   BMI 25.83 kg/m   Physical Exam:  Well appearing NAD HEENT: Unremarkable Neck:  No JVD, no thyromegally Lymphatics:  No adenopathy Back:  No CVA tenderness Lungs:  Clear with no wheezes HEART:  Regular rate rhythm, no murmurs, no rubs, no clicks Abd:  soft, positive bowel sounds, no organomegally, no rebound, no guarding Ext:  2 plus pulses, no edema, no cyanosis, no clubbing Skin:  No rashes no nodules Neuro:  CN II through XII intact, motor grossly intact   Assess/Plan:   Cryptogenic stroke -I have reviewed the treatment options with the patient. He would like to pursue ILR insertion. I have outlined the indications/risks/benefits/goals/expectations and he wishes to proceed. HTN - his bp is well controlled. No change in his meds.  EP Procedure Note  Procedure Performed: ILR insertion  Preoperative diagnosis: cryptogenic stroke  Postop diagnosis: same  Description of the procedure: after informed consent was obtained, the patient was prepped and draped in a sterile manner. 10 cc of lidocaine was infiltrated. A one Cm stab incision was carried out. The Medtronic ILR, #[id number] was inserted. The R waves measured 0.4 MV. Hemostasis was achieved. Benzoin and steristrips were placed and the incision was covered with a bandage and the patient returned to his room in stable condition.  Complications: none immediately  Conclusion: successful ILR insertion.  Gregg Taylor,MD   Gregg Taylor,MD 

## 2022-02-23 NOTE — Telephone Encounter (Signed)
Been harder to get rid of foot slap-  If IT band exercises don't work- call me back and will get more scans to make sure things not getting worse.  Walking is worse. Cannot do exercises quite as easily anymore as well.   Continue Gabapentin, but cannot tolerate higher dose-  Due to sedation- so cannot increase it.   Call me back in 2 weeks and we will rescan you if need be- ML  These exercises are focused on correcting the most common causes that lead  to iliotibial band (IT band) syndrome. It is frequently related to hip weakness  and instability standing on one leg.  The exercises are listed in a progressive order. The first three can be started  immediately after the injury. Each exercise listed after the first three should be  added only when you are able to complete the previous exercise using a good  technique and without symptoms resulting.  Note: These are general exercise recommendations and not all may be beneficial  to your particular health issue. None of the exercises should make your symptoms  worse. If an exercise is painful, it may not be appropriate for your condition. If you  have questions regarding the exercises, consult your physical therapist.  1.Clamshells:  Lie on your side with your feet together and  knees bent. While keeping your feet together,  lift the top knee. Repeat 15 times, 2 sets.  2.Gluteal stretch:  Lie on your back with your knees bent and  feet on the bed or floor. Place one ankle on  the opposite thigh. Pull your knee towards  the opposite shoulder. The stretch should  be felt on the back and lateral side of your  hip. Hold for 30 seconds, 2 times. 3.Hip flexor stretch:  a.Standing: Stand with a bent knee on a chair and the  standing leg in front. Tighten your buttocks to  move your hips forward. The stretch should be  on the front of your hip and/or thigh. Hold 30  seconds, 2 times. b.Thomas position: Sit on the side of a bed holding  one leg to your  chest. Lie down. Press the heel of the leg off the  bed down and pull it back. You should feel the  stretch on the front of your hip and/or thigh.  Hold for 30 seconds, 2 times.  4.Single leg stance:  Stand in a corner without furniture or in front of  a sink in case you lose balance. Place your  hands on your hips. Keep your hips level and  your trunk upright. Aim to hold for 30 seconds  without resting. Practice for a total of 2 minutes.  Iliotibial Band Strengthening Exercises Syndrome  Courtesy of: Orthopaedics If you have  questions, please  contact our team  by email to  [email or web address] or call  [phone]. [phone].Standing hip motions:  Practice standing on each leg. Begin  with no band and progress to using a  stretchable band. With each position, keep  your hips level and your trunk upright.  Repeat 10 times in each direction, 2 sets. a.Flexion:  Standing upright, move one leg forward. b.Abduction: Standing upright, move one leg to the side. c.Extension:  Standing upright, move one leg back. 6.Windmill:  Begin the exercise by standing upright  on one leg that is slightly bent. Bend  your trunk forward as you bring the  other leg back. Complete the exercise  by touching an object  on the floor.  Repeat 10 times, 2 sets.  Foam roll massage options Side of thigh:  Lay on foam roll on your injured side so that the roll sits under the side of the leg just below the hip joint.  Support the weight of your body on your hands and opposite leg which is crossed out in front to help you  balance. Roll back and forth from below the hip until just above the knee.  NOTE: If your IT band is really tight this may be a painful exercise. You can adjust the amount of pressure  you apply to make this more tolerable by helping to support the weight of your body with your uninjured  leg. As your IT band becomes looser the exercise will  become less uncomfortable.  Gluteal muscles:  Lay on foam roll on your injured side under gluteal muscles.

## 2022-02-26 ENCOUNTER — Ambulatory Visit (AMBULATORY_SURGERY_CENTER): Payer: Medicare Other | Admitting: Gastroenterology

## 2022-02-26 ENCOUNTER — Encounter: Payer: Self-pay | Admitting: Gastroenterology

## 2022-02-26 VITALS — BP 131/68 | HR 51 | Temp 97.1°F | Resp 9 | Ht 70.0 in | Wt 184.0 lb

## 2022-02-26 DIAGNOSIS — D123 Benign neoplasm of transverse colon: Secondary | ICD-10-CM | POA: Diagnosis not present

## 2022-02-26 DIAGNOSIS — D124 Benign neoplasm of descending colon: Secondary | ICD-10-CM

## 2022-02-26 DIAGNOSIS — Z1211 Encounter for screening for malignant neoplasm of colon: Secondary | ICD-10-CM | POA: Diagnosis not present

## 2022-02-26 DIAGNOSIS — D122 Benign neoplasm of ascending colon: Secondary | ICD-10-CM | POA: Diagnosis not present

## 2022-02-26 MED ORDER — SODIUM CHLORIDE 0.9 % IV SOLN: 500.0000 mL | Freq: Once | INTRAVENOUS | Status: DC

## 2022-02-26 NOTE — Progress Notes (Signed)
To pacu, VSS. Report to [email or web address] 

## 2022-02-26 NOTE — Progress Notes (Signed)
[facility] History and Physical   Primary Care Physician:  Janith Lima, MD   Reason for Procedure:   Colon cancer screening  Plan:    Screening colonoscopy     HPI: Eddie Calhoun is a 71 y.o. male undergoing initial screening colonoscopy.  His father was diagnosed with colon cancer in his 11s.  He has no chronic GI symptoms. He has had 2 prior colonoscopies without polyps, last one at age 39 with [facility].   Past Medical History:  Diagnosis Date   Allergy    allergic rhinitis   BPH (benign prostatic hyperplasia)    Elevated prostate specific antigen (PSA)    Hypertension    Implantable loop recorder present 02/04/2022   Plantar fasciitis of right foot    Pneumonia    Thrombocytopenia, unspecified ([place]) 07/26/2012   03/24/12  129,000!    Past Surgical History:  Procedure Laterality Date   CARPAL TUNNEL RELEASE Bilateral 04/2021    Prior to Admission medications   Medication Sig Start Date End Date Taking? Authorizing Provider  acetaminophen (TYLENOL) 325 MG tablet Take 2 tablets (650 mg total) by mouth every 4 (four) hours as needed for mild pain (or temp > 37.5 C (99.5 F)). 07/04/21  Yes de Yolanda Manges, Cortney E, NP  aspirin EC 81 MG tablet 1 tablet 07/17/21  Yes [provider]  cholecalciferol (VITAMIN D3) 25 MCG (1000 UNIT) tablet Take 1 tablet (1,000 Units total) by mouth daily. 07/17/21  Yes Angiulli, Lavon Paganini, PA-C  gabapentin (NEURONTIN) 400 MG capsule Take 1 capsule (400 mg total) by mouth 3 (three) times daily. For nerve pain Patient taking differently: Take 400 mg by mouth 2 (two) times daily. For nerve pain 02/07/22  Yes Lovorn, Jinny Blossom, MD  Multiple Vitamin (MULTIVITAMIN) tablet Take 1 tablet by mouth daily.     Yes [provider]  olmesartan (BENICAR) 20 MG tablet Take 0.5 tablets (10 mg total) by mouth daily. 12/12/21  Yes Janith Lima, MD  rosuvastatin (CRESTOR) 20 MG tablet Take 1 tablet (20 mg total) by mouth daily. 12/12/21   Yes Janith Lima, MD  tamsulosin (FLOMAX) 0.4 MG CAPS capsule Take 1 capsule (0.4 mg total) by mouth daily after supper. 12/12/21  Yes Janith Lima, MD  vitamin B-12 (CYANOCOBALAMIN) 1000 MCG tablet Take 1 tablet (1,000 mcg total) by mouth daily. 07/17/21  Yes Angiulli, Lavon Paganini, PA-C  ferrous sulfate 325 (65 FE) MG tablet Take 1 tablet (325 mg total) by mouth daily with breakfast. 12/12/21   Janith Lima, MD    Current Outpatient Medications  Medication Sig Dispense Refill   acetaminophen (TYLENOL) 325 MG tablet Take 2 tablets (650 mg total) by mouth every 4 (four) hours as needed for mild pain (or temp > 37.5 C (99.5 F)). 30 tablet 0   aspirin EC 81 MG tablet 1 tablet     cholecalciferol (VITAMIN D3) 25 MCG (1000 UNIT) tablet Take 1 tablet (1,000 Units total) by mouth daily. 30 tablet 0   gabapentin (NEURONTIN) 400 MG capsule Take 1 capsule (400 mg total) by mouth 3 (three) times daily. For nerve pain (Patient taking differently: Take 400 mg by mouth 2 (two) times daily. For nerve pain) 90 capsule 5   Multiple Vitamin (MULTIVITAMIN) tablet Take 1 tablet by mouth daily.       olmesartan (BENICAR) 20 MG tablet Take 0.5 tablets (10 mg total) by mouth daily. 90 tablet 1   rosuvastatin (CRESTOR) 20 MG  tablet Take 1 tablet (20 mg total) by mouth daily. 90 tablet 1   tamsulosin (FLOMAX) 0.4 MG CAPS capsule Take 1 capsule (0.4 mg total) by mouth daily after supper. 90 capsule 1   vitamin B-12 (CYANOCOBALAMIN) 1000 MCG tablet Take 1 tablet (1,000 mcg total) by mouth daily. 30 tablet 0   ferrous sulfate 325 (65 FE) MG tablet Take 1 tablet (325 mg total) by mouth daily with breakfast. 90 tablet 0   Current Facility-Administered Medications  Medication Dose Route Frequency Provider Last Rate Last Admin   0.9 %  sodium chloride infusion  500 mL Intravenous Once Daryel November, MD        Allergies as of 02/26/2022 - Review Complete 02/26/2022  Allergen Reaction Noted   Contrast media  [iodinated contrast media] Hives 09/21/2010   Dutasteride Other (See Comments) 07/19/2021    Family History  Problem Relation Age of Onset   Hypertension Mother    Colon cancer Father 63   Hypertension Father    Cancer Father 4       father died with hx of prostate cancer   Esophageal cancer Neg Hx    Rectal cancer Neg Hx    Stomach cancer Neg Hx     Social History   Socioeconomic History   Marital status: Married    Spouse name: Not on file   Number of children: Not on file   Years of education: Not on file   Highest education level: Not on file  Occupational History   Not on file  Tobacco Use   Smoking status: Former    Packs/day: 2.00    Years: 20.00    Total pack years: 40.00    Types: Cigarettes    Quit date: 09/21/1987    Years since quitting: 34.4   Smokeless tobacco: Never  Vaping Use   Vaping Use: Never used  Substance and Sexual Activity   Alcohol use: Yes    Comment: social   Drug use: Never   Sexual activity: Not Currently  Other Topics Concern   Not on file  Social History Narrative   Not on file   Social Determinants of Health   Financial Resource Strain: Not on file  Food Insecurity: Not on file  Transportation Needs: Not on file  Physical Activity: Not on file  Stress: Not on file  Social Connections: Not on file  Intimate Partner Violence: Not on file    Review of Systems:  All other review of systems negative except as mentioned in the HPI.  Physical Exam: Vital signs BP 121/71   Pulse (!) 54   Temp (!) 97.1 F (36.2 C) (Skin)   Ht '5\' 10"'$  (1.778 m)   Wt 184 lb (83.5 kg)   SpO2 96%   BMI 26.40 kg/m   General:   Alert,  Well-developed, well-nourished, pleasant and cooperative in NAD Airway:  Mallampati 2 Lungs:  Clear throughout to auscultation.   Heart:  Regular rate and rhythm; no murmurs, clicks, rubs,  or gallops. Abdomen:  Soft, nontender and nondistended. Normal bowel sounds.   Neuro/Psych:  Normal mood and affect. A  and O x 3   Deyona Soza E. Candis Schatz, MD [facility]

## 2022-02-26 NOTE — Progress Notes (Signed)
Pt's states no medical or surgical changes since previsit or office visit. VS assessed by D.T 

## 2022-02-26 NOTE — Patient Instructions (Signed)
YOU HAD AN ENDOSCOPIC PROCEDURE TODAY AT THE [facility]:   Refer to the procedure report that was given to you for any specific questions about what was found during the examination.  If the procedure report does not answer your questions, please call your gastroenterologist to clarify.  If you requested that your care partner not be given the details of your procedure findings, then the procedure report has been included in a sealed envelope for you to review at your convenience later.  YOU SHOULD EXPECT: Some feelings of bloating in the abdomen. Passage of more gas than usual.  Walking can help get rid of the air that was put into your GI tract during the procedure and reduce the bloating. If you had a lower endoscopy (such as a colonoscopy or flexible sigmoidoscopy) you may notice spotting of blood in your stool or on the toilet paper. If you underwent a bowel prep for your procedure, you may not have a normal bowel movement for a few days.  Please Note:  You might notice some irritation and congestion in your nose or some drainage.  This is from the oxygen used during your procedure.  There is no need for concern and it should clear up in a day or so.  SYMPTOMS TO REPORT IMMEDIATELY:  Following lower endoscopy (colonoscopy or flexible sigmoidoscopy):  Excessive amounts of blood in the stool  Significant tenderness or worsening of abdominal pains  Swelling of the abdomen that is new, acute  Fever of 100F or higher  For urgent or emergent issues, a gastroenterologist can be reached at any hour by calling [phone]. Do not use MyChart messaging for urgent concerns.    DIET:  We do recommend a small meal at first, but then you may proceed to your regular diet.  Drink plenty of fluids but you should avoid alcoholic beverages for 24 hours.  ACTIVITY:  You should plan to take it easy for the rest of today and you should NOT DRIVE or use heavy machinery until tomorrow (because of  the sedation medicines used during the test).    FOLLOW UP: Our staff will call the number listed on your records the next business day following your procedure.  We will call around 7:15- 8:00 am to check on you and address any questions or concerns that you may have regarding the information given to you following your procedure. If we do not reach you, we will leave a message.     If any biopsies were taken you will be contacted by phone or by letter within the next 1-3 weeks.  Please call us at [phone] if you have not heard about the biopsies in 3 weeks.    SIGNATURES/CONFIDENTIALITY: You and/or your care partner have signed paperwork which will be entered into your electronic medical record.  These signatures attest to the fact that that the information above on your After Visit Summary has been reviewed and is understood.  Full responsibility of the confidentiality of this discharge information lies with you and/or your care-partner.  

## 2022-02-26 NOTE — Op Note (Signed)
[facility] Patient Name: Eddie Calhoun Procedure Date: 02/26/2022 7:56 AM MRN: [id number] Endoscopist: Nicki Reaper E. Candis Schatz , MD Age: 71 Referring MD:  Date of Birth: [birth year] Gender: Male Account #: [id number] Procedure:                Colonoscopy Indications:              Screening in patient at increased risk: Colorectal                            cancer in father 75 or older, last colonoscopy age                            65, normal. No history of polyps. Medicines:                Monitored Anesthesia Care Procedure:                Pre-Anesthesia Assessment:                           - Prior to the procedure, a History and Physical                            was performed, and patient medications and                            allergies were reviewed. The patient's tolerance of                            previous anesthesia was also reviewed. The risks                            and benefits of the procedure and the sedation                            options and risks were discussed with the patient.                            All questions were answered, and informed consent                            was obtained. Prior Anticoagulants: The patient has                            taken no previous anticoagulant or antiplatelet                            agents except for aspirin. ASA Grade Assessment:                            III - A patient with severe systemic disease. After                            reviewing the risks and benefits, the patient was  deemed in satisfactory condition to undergo the                            procedure.                           After obtaining informed consent, the colonoscope                            was passed under direct vision. Throughout the                            procedure, the patient's blood pressure, pulse, and                            oxygen saturations were monitored continuously.  The                            Olympus CF-HQ190L (Serial# [id number]) Colonoscope was                            introduced through the anus and advanced to the the                            terminal ileum, with identification of the                            appendiceal orifice and IC valve. The colonoscopy                            was somewhat difficult due to significant looping                            and a tortuous colon. Successful completion of the                            procedure was aided by using manual pressure. The                            patient tolerated the procedure well. The quality                            of the bowel preparation was good. The ileocecal                            valve, appendiceal orifice, and rectum were                            photographed. The bowel preparation used was SUPREP                            via split dose instruction. Scope In: 8:03:14 AM Scope Out: 8:32:06 AM Scope Withdrawal Time: 0 hours 16 minutes 37 seconds  Total Procedure Duration: 0 hours 28  minutes 52 seconds  Findings:                 Hemorrhoids were found on perianal exam.                           The digital rectal exam was normal. Pertinent                            negatives include normal sphincter tone and no                            palpable rectal lesions.                           A 2 mm polyp was found in the ascending colon. The                            polyp was sessile. The polyp was removed with a                            cold snare. Resection and retrieval were complete.                            Estimated blood loss was minimal.                           A 3 mm polyp was found in the hepatic flexure. The                            polyp was sessile. The polyp was removed with a                            cold snare. Resection and retrieval were complete.                            Estimated blood loss was minimal.                            Two sessile polyps were found in the transverse                            colon. The polyps were 2 to 5 mm in size. These                            polyps were removed with a cold snare. Resection                            and retrieval were complete. Estimated blood loss                            was minimal.                           A  3 mm polyp was found in the splenic flexure. The                            polyp was sessile. The polyp was removed with a                            cold snare. Resection and retrieval were complete.                            Estimated blood loss was minimal.                           Two sessile polyps were found in the descending                            colon. The polyps were 4 mm in size. These polyps                            were removed with a cold snare. Resection and                            retrieval were complete. Estimated blood loss was                            minimal.                           The exam was otherwise normal throughout the                            examined colon.                           Non-bleeding internal hemorrhoids were found during                            retroflexion. The hemorrhoids were Grade I                            (internal hemorrhoids that do not prolapse).                           Anal papilla(e) were hypertrophied.                           No additional abnormalities were found on                            retroflexion. Complications:            No immediate complications. Estimated Blood Loss:     Estimated blood loss was minimal. Impression:               - Hemorrhoids found on perianal exam.                           -  One 2 mm polyp in the ascending colon, removed                            with a cold snare. Resected and retrieved.                           - One 3 mm polyp at the hepatic flexure, removed                            with a cold snare. Resected and retrieved.                            - Two 2 to 5 mm polyps in the transverse colon,                            removed with a cold snare. Resected and retrieved.                           - One 3 mm polyp at the splenic flexure, removed                            with a cold snare. Resected and retrieved.                           - Two 4 mm polyps in the descending colon, removed                            with a cold snare. Resected and retrieved.                           - Non-bleeding internal hemorrhoids.                           - Anal papilla(e) were hypertrophied. Recommendation:           - Patient has a contact number available for                            emergencies. The signs and symptoms of potential                            delayed complications were discussed with the                            patient. Return to normal activities tomorrow.                            Written discharge instructions were provided to the                            patient.                           - Resume previous diet.                           -  Continue present medications.                           - Await pathology results.                           - Repeat colonoscopy (date not yet determined) for                            surveillance based on pathology results. Masyn Fullam E. Candis Schatz, MD 02/26/2022 8:42:40 AM This report has been signed electronically.

## 2022-02-27 ENCOUNTER — Telehealth: Payer: Self-pay | Admitting: *Deleted

## 2022-02-27 NOTE — Telephone Encounter (Signed)
  Follow up Call-     02/26/2022    7:08 AM  Call back number  Post procedure Call Back phone  # [phone]  Permission to leave phone message Yes     Patient questions:  Do you have a fever, pain , or abdominal swelling? No. Pain Score  0 *  Have you tolerated food without any problems? Yes.    Have you been able to return to your normal activities? Yes.    Do you have any questions about your discharge instructions: Diet   No. Medications  No. Follow up visit  No.  Do you have questions or concerns about your Care? No.  Actions: * If pain score is 4 or above: No action needed, pain <4.

## 2022-03-01 NOTE — Progress Notes (Signed)
Eddie Calhoun, The seven polyps that I removed during your recent procedure were completely benign but were proven to be "pre-cancerous" polyps that MAY have grown into cancers if they had not been removed.  Studies shows that at least 20% of women over age 70 and 30% of men over age 77 have pre-cancerous polyps.  Based on current nationally recognized surveillance guidelines, I recommend that you have a repeat colonoscopy in 3 years.   If you develop any new rectal bleeding, abdominal pain or significant bowel habit changes, please contact me before then.

## 2022-03-09 ENCOUNTER — Telehealth: Payer: Self-pay | Admitting: Physical Medicine and Rehabilitation

## 2022-03-09 DIAGNOSIS — M5416 Radiculopathy, lumbar region: Secondary | ICD-10-CM

## 2022-03-09 NOTE — Telephone Encounter (Signed)
Pt having more weakness of foot/ with more foot  drop- yes he had a stroke, but this isn't worse due to a stroke- since it's gradual onset.   Cannot stand on 1 foot more than 1-2 seconds anymore- used to do 30 seconds.  Pain in LLE is better with IT exercises, but not gone- and goes down the L side of LLE- better with gabapentin.   Plan: 1/. Due to LLE weakness, HAVE to get lumbar MRI- is noticeable in pt functionally as well as strength wise and in gait. Concerned might need lumbar surgery form herniated disc.   2. Will order lumbar MRI without contrast

## 2022-03-14 ENCOUNTER — Ambulatory Visit (INDEPENDENT_AMBULATORY_CARE_PROVIDER_SITE_OTHER): Payer: Medicare Other

## 2022-03-14 DIAGNOSIS — I639 Cerebral infarction, unspecified: Secondary | ICD-10-CM

## 2022-03-15 LAB — CUP PACEART REMOTE DEVICE CHECK
Date Time Interrogation Session: 20231108091854
Implantable Pulse Generator Implant Date: 20231005

## 2022-03-19 ENCOUNTER — Encounter: Payer: Self-pay | Admitting: Physical Medicine and Rehabilitation

## 2022-03-19 ENCOUNTER — Encounter
Payer: Medicare Other | Attending: Physical Medicine and Rehabilitation | Admitting: Physical Medicine and Rehabilitation

## 2022-03-19 VITALS — BP 134/81 | HR 61 | Ht 70.0 in | Wt 183.0 lb

## 2022-03-19 DIAGNOSIS — M792 Neuralgia and neuritis, unspecified: Secondary | ICD-10-CM | POA: Insufficient documentation

## 2022-03-19 DIAGNOSIS — M21372 Foot drop, left foot: Secondary | ICD-10-CM | POA: Diagnosis not present

## 2022-03-19 DIAGNOSIS — R29898 Other symptoms and signs involving the musculoskeletal system: Secondary | ICD-10-CM | POA: Diagnosis present

## 2022-03-19 DIAGNOSIS — I639 Cerebral infarction, unspecified: Secondary | ICD-10-CM | POA: Diagnosis not present

## 2022-03-19 NOTE — Patient Instructions (Signed)
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 Also LLE pain which I'm concerned that could be due to lumbar spine stenosis/radiculopathy?  MRI scheduled for this Sunday.  To see if can figure out cause of pain/ as well as increased LLE weakness- has very trace spasticity, but I don't think this is much worse than before.   2. Strength based on exam, is much worse and gait much worse than before- so MRI should hopefully tell us what's going on.    3. Gabapentin makes him a little sleep- so don't want ot increase.    4. Duloxetine for nerve pain-  30 mg daily or nightly x 1 week, then 60 mg daily/nightly-   - most common side effect - 3% have nausea- if you get it, call me for anti-nausea medicine- only last 7-10 days-   Mild constipation, dry mouth dry eyes- difficulty urinating- longer til orgasm.   5.  F/U in 3 months, but will call when gets MRI results.   6. If cannot find cause, will need to send to Neuro for additional w/u.

## 2022-03-19 NOTE — Progress Notes (Signed)
Subjective:    Patient ID: Eddie Calhoun, male    DOB: 12/15/1950, 71 y.o.   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  Talked last week about LLE weakness.  Morning, the pain level is doing a little better.   Waking up at 4:30am or so- gabapentin 400 mg and tylenol and 200 mg Advil- and by the time wakes up at 6-6:30am, pain is much more tolerable.   Still causes him to have knee giving out- and will stumble always catches self, but very painful. Doesn't feel real safe with walking.   Walking is not as good- have more foot slap- and feels weaker on LLE.    Pain mostly in L knee and back of knee- moves around some- but also has an acing/throbbing pain in L lateral calf- and goes down to L foot-  Down to toes that "don't work " on l foot- And top of L foot and underneath-     Pain Inventory Average Pain 5 Pain Right Now 2 My pain is dull and throbbing  In the last 24 hours, has pain interfered with the following? General activity 0 Relation with others 0 Enjoyment of life 2 What TIME of day is your pain at its worst? morning  Sleep (in general) Good  Pain is worse with: walking and bending Pain improves with: therapy/exercise and medication Relief from Meds: 5  Family History  Problem Relation Age of Onset   Hypertension Mother    Colon cancer Father 38   Hypertension Father    Cancer Father 41       father died with hx of prostate cancer   Esophageal cancer Neg Hx    Rectal cancer Neg Hx    Stomach cancer Neg Hx    Social History   Socioeconomic History   Marital status: Married    Spouse name: Not on file   Number of children: Not on file   Years of education: Not on file   Highest education level: Not on file  Occupational History   Not on file  Tobacco Use   Smoking  status: Former    Packs/day: 2.00    Years: 20.00    Total pack years: 40.00    Types: Cigarettes    Quit date: 09/21/1987    Years since quitting: 34.5   Smokeless tobacco: Never  Vaping Use   Vaping Use: Never used  Substance and Sexual Activity   Alcohol use: Yes    Comment: social   Drug use: Never   Sexual activity: Not Currently  Other Topics Concern   Not on file  Social History Narrative   Not on file   Social Determinants of Health   Financial Resource Strain: Not on file  Food Insecurity: Not on file  Transportation Needs: Not on file  Physical Activity: Not on file  Stress: Not on file  Social Connections: Not on file   Past Surgical History:  Procedure Laterality Date   CARPAL TUNNEL RELEASE Bilateral 04/2021   Past Surgical History:  Procedure Laterality Date   CARPAL TUNNEL RELEASE Bilateral 04/2021   Past Medical History:  Diagnosis Date   Allergy    allergic rhinitis   BPH (benign prostatic hyperplasia)    Elevated prostate specific antigen (PSA)  Hypertension    Implantable loop recorder present 02/04/2022   Plantar fasciitis of right foot    Pneumonia    Thrombocytopenia, unspecified ([place]) 07/26/2012   03/24/12  129,000!   BP 134/81   Pulse 61   Ht '5\' 10"'$  (1.778 m)   Wt 183 lb (83 kg)   SpO2 97%   BMI 26.26 kg/m   Opioid Risk Score:   Fall Risk Score:  `1  Depression screen [facility] 2/9     03/19/2022    2:35 PM 01/23/2022    8:35 AM 12/12/2021    8:29 AM 12/04/2021   10:28 AM 08/23/2021   10:21 AM 07/28/2021    1:14 PM  Depression screen PHQ 2/9  Decreased Interest 0 0 0 0 0 0  Down, Depressed, Hopeless 0 0 0 0 0 0  PHQ - 2 Score 0 0 0 0 0 0  Altered sleeping  0    0  Tired, decreased energy  0    1  Change in appetite  0    0  Feeling bad or failure about yourself   0    0  Trouble concentrating  0    0  Moving slowly or fidgety/restless  0    3  Suicidal thoughts  0    0  PHQ-9 Score  0    4  Difficult doing work/chores   Not difficult at all         Review of Systems  Musculoskeletal:        Pelvic pain Left leg pain  All other systems reviewed and are negative.     Objective:   Physical Exam  Awake, alert, appropriate, sitting on table, NAD No assistive device MS: RLE_ 5/5 in same muscles  LLE- HF 4-/5; KE 5-/5 and PF 5-/5; DF 4-/5 and PF 3-/5 (was HF 5-/5; KE/KF 5/5, DF 5-5 and PF 4+ to 5-/5) RUE: 5/5 in same muscles LUE: 5-5 throughout in LUE Neuro- maybe MAS of 1 in L knee/ankle, but not in L hip: 2-3 beats clonus LLE- slightly increased tone No hoffman's.   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 Also LLE pain which I'm concerned that could be due to lumbar spine stenosis/radiculopathy?  MRI scheduled for this Sunday.  To see if can figure out cause of pain/ as well as increased LLE weakness- has very trace spasticity, but I don't think this is much worse than before.   2. Strength based on exam, is much worse and gait much worse than before- so MRI should hopefully tell us what's going on.    3. Gabapentin makes him a little sleep- so don't want ot increase.    4. Duloxetine for nerve pain-  30 mg daily or nightly x 1 week, then 60 mg daily/nightly-   - most common side effect - 3% have nausea- if you get it, call me for anti-nausea medicine- only last 7-10 days-   Mild constipation, dry mouth dry eyes- difficulty urinating- longer til orgasm.   5.  F/U in 3 months, but will call when gets MRI results.  6. If cannot find cause, will need to send to Neuro for additional w/u.    7. Will wait to send back to therapy until figure out cause.    I spent a  total of  30  minutes on total care today- >50% coordination of care- due to gait, discussing MRI, and causes, can also send to Neuro if cannot find cause.

## 2022-03-20 MED ORDER — DULOXETINE HCL 30 MG PO CPEP: 30.0000 mg | ORAL_CAPSULE | Freq: Every day | ORAL | 5 refills | Status: DC

## 2022-03-20 NOTE — Addendum Note (Signed)
Addended by: Courtney Heys on: 03/20/2022 08:06 PM   Modules accepted: Orders

## 2022-03-23 ENCOUNTER — Ambulatory Visit
Admission: RE | Admit: 2022-03-23 | Discharge: 2022-03-23 | Disposition: A | Discharge status: Home / Self Care | Payer: Medicare Other | Source: Ambulatory Visit | Attending: Physical Medicine and Rehabilitation | Admitting: Physical Medicine and Rehabilitation

## 2022-03-23 DIAGNOSIS — M5416 Radiculopathy, lumbar region: Secondary | ICD-10-CM

## 2022-03-25 ENCOUNTER — Other Ambulatory Visit: Payer: Medicare Other

## 2022-03-26 ENCOUNTER — Telehealth: Payer: Self-pay | Admitting: Physical Medicine and Rehabilitation

## 2022-03-26 DIAGNOSIS — M5416 Radiculopathy, lumbar region: Secondary | ICD-10-CM

## 2022-03-26 DIAGNOSIS — M48061 Spinal stenosis, lumbar region without neurogenic claudication: Secondary | ICD-10-CM

## 2022-03-26 NOTE — Telephone Encounter (Signed)
I already placed referral- he needs to get results/actual disc by getting it from location he go tit done- thanks- ML

## 2022-03-26 NOTE — Telephone Encounter (Signed)
Have called pt- he needs l L5 ESI- explained what was going on in his MRI and what this means- needs ESI first, and if not effective, might need [facility] consult/referral- He wants to call [facility] to see if there's someone there that will do it- if not, will let me know if Dr Aretta Nip can do.

## 2022-03-26 NOTE — Telephone Encounter (Signed)
Patient requesting referrals to [facility] per conversation with Dr. Dagoberto Ligas. Please attach MRI results with referral. Patient is hoping to be seen this week by them.

## 2022-03-27 NOTE — Telephone Encounter (Signed)
Handled in mychart message

## 2022-03-28 NOTE — Progress Notes (Signed)
Carelink Summary Report / Loop Recorder 

## 2022-04-16 ENCOUNTER — Ambulatory Visit: Payer: PRIVATE HEALTH INSURANCE | Admitting: Physical Medicine and Rehabilitation

## 2022-04-16 ENCOUNTER — Ambulatory Visit (INDEPENDENT_AMBULATORY_CARE_PROVIDER_SITE_OTHER): Payer: Medicare Other

## 2022-04-16 DIAGNOSIS — I639 Cerebral infarction, unspecified: Secondary | ICD-10-CM

## 2022-04-17 LAB — CUP PACEART REMOTE DEVICE CHECK
Date Time Interrogation Session: 20231211091847
Implantable Pulse Generator Implant Date: 20231005

## 2022-04-25 ENCOUNTER — Ambulatory Visit: Payer: PRIVATE HEALTH INSURANCE | Admitting: Physical Medicine and Rehabilitation

## 2022-05-03 ENCOUNTER — Encounter: Payer: Self-pay | Admitting: Physical Medicine and Rehabilitation

## 2022-05-09 NOTE — Telephone Encounter (Signed)
D/w pt- don't know anyone that does post stroke therapy.   Getting more strength in LLE.  Still some foot slap- but better than it was.   Pain much better- actually gone! Reducing Duloxetine dose to every other day now.

## 2022-05-21 ENCOUNTER — Ambulatory Visit (INDEPENDENT_AMBULATORY_CARE_PROVIDER_SITE_OTHER): Payer: Medicare Other

## 2022-05-21 DIAGNOSIS — I639 Cerebral infarction, unspecified: Secondary | ICD-10-CM

## 2022-05-22 LAB — CUP PACEART REMOTE DEVICE CHECK
Date Time Interrogation Session: 20240113091538
Implantable Pulse Generator Implant Date: 20231005

## 2022-05-25 NOTE — Progress Notes (Signed)
Carelink Summary Report / Loop Recorder

## 2022-06-14 ENCOUNTER — Encounter: Payer: Self-pay | Admitting: Internal Medicine

## 2022-06-14 ENCOUNTER — Ambulatory Visit (INDEPENDENT_AMBULATORY_CARE_PROVIDER_SITE_OTHER): Payer: Medicare Other | Admitting: Internal Medicine

## 2022-06-14 VITALS — BP 128/74 | HR 60 | Temp 97.9°F | Resp 16 | Ht 70.0 in | Wt 179.0 lb

## 2022-06-14 DIAGNOSIS — E785 Hyperlipidemia, unspecified: Secondary | ICD-10-CM | POA: Diagnosis not present

## 2022-06-14 DIAGNOSIS — D508 Other iron deficiency anemias: Secondary | ICD-10-CM | POA: Diagnosis not present

## 2022-06-14 DIAGNOSIS — Z23 Encounter for immunization: Secondary | ICD-10-CM

## 2022-06-14 DIAGNOSIS — I10 Essential (primary) hypertension: Secondary | ICD-10-CM

## 2022-06-14 DIAGNOSIS — D696 Thrombocytopenia, unspecified: Secondary | ICD-10-CM

## 2022-06-14 LAB — HEPATIC FUNCTION PANEL
ALT: 25 U/L (ref 0–53)
AST: 23 U/L (ref 0–37)
Albumin: 4.3 g/dL (ref 3.5–5.2)
Alkaline Phosphatase: 50 U/L (ref 39–117)
Bilirubin, Direct: 0.1 mg/dL (ref 0.0–0.3)
Total Bilirubin: 0.6 mg/dL (ref 0.2–1.2)
Total Protein: 7.1 g/dL (ref 6.0–8.3)

## 2022-06-14 LAB — CBC WITH DIFFERENTIAL/PLATELET
Basophils Absolute: 0 10*3/uL (ref 0.0–0.1)
Basophils Relative: 0.4 % (ref 0.0–3.0)
Eosinophils Absolute: 0.6 10*3/uL (ref 0.0–0.7)
Eosinophils Relative: 9.5 % — ABNORMAL HIGH (ref 0.0–5.0)
HCT: 40.5 % (ref 39.0–52.0)
Hemoglobin: 14.1 g/dL (ref 13.0–17.0)
Lymphocytes Relative: 18.7 % (ref 12.0–46.0)
Lymphs Abs: 1.1 10*3/uL (ref 0.7–4.0)
MCHC: 34.8 g/dL (ref 30.0–36.0)
MCV: 102.8 fl — ABNORMAL HIGH (ref 78.0–100.0)
Monocytes Absolute: 0.4 10*3/uL (ref 0.1–1.0)
Monocytes Relative: 6.7 % (ref 3.0–12.0)
Neutro Abs: 3.9 10*3/uL (ref 1.4–7.7)
Neutrophils Relative %: 64.7 % (ref 43.0–77.0)
Platelets: 139 10*3/uL — ABNORMAL LOW (ref 150.0–400.0)
RBC: 3.94 Mil/uL — ABNORMAL LOW (ref 4.22–5.81)
RDW: 13.3 % (ref 11.5–15.5)
WBC: 6.1 10*3/uL (ref 4.0–10.5)

## 2022-06-14 LAB — LIPID PANEL
Cholesterol: 133 mg/dL (ref 0–200)
HDL: 55.9 mg/dL (ref >39.00)
LDL Cholesterol: 56 mg/dL (ref 0–99)
NonHDL: 77.06
Total CHOL/HDL Ratio: 2
Triglycerides: 105 mg/dL (ref 0.0–149.0)
VLDL: 21 mg/dL (ref 0.0–40.0)

## 2022-06-14 LAB — IBC + FERRITIN
Ferritin: 23.4 ng/mL (ref 22.0–322.0)
Iron: 136 ug/dL (ref 42–165)
Saturation Ratios: 34.9 % (ref 20.0–50.0)
TIBC: 389.2 ug/dL (ref 250.0–450.0)
Transferrin: 278 mg/dL (ref 212.0–360.0)

## 2022-06-14 LAB — BASIC METABOLIC PANEL
BUN: 23 mg/dL (ref 6–23)
CO2: 28 mEq/L (ref 19–32)
Calcium: 9.6 mg/dL (ref 8.4–10.5)
Chloride: 104 mEq/L (ref 96–112)
Creatinine, Ser: 1.2 mg/dL (ref 0.40–1.50)
GFR: 61.17 mL/min (ref >60.00)
Glucose, Bld: 93 mg/dL (ref 70–99)
Potassium: 4.7 mEq/L (ref 3.5–5.1)
Sodium: 140 mEq/L (ref 135–145)

## 2022-06-14 MED ORDER — SCOPOLAMINE 1 MG/3DAYS TD PT72: 1.0000 | MEDICATED_PATCH | TRANSDERMAL | 0 refills | Status: DC

## 2022-06-14 NOTE — Progress Notes (Signed)
Subjective:  Patient ID: Eddie Calhoun, male    DOB: [birth year]  Age: 72 y.o. MRN: [id number]  CC: Hypertension, Anemia, and Hyperlipidemia   HPI Eddie Calhoun presents for f/up --  He climbs 130 stairs in 2 minutes and does not experience chest pain, shortness of breath, diaphoresis, edema, palpitations, dizziness, or lightheadedness.  Outpatient Medications Prior to Visit  Medication Sig Dispense Refill   acetaminophen (TYLENOL) 325 MG tablet Take 2 tablets (650 mg total) by mouth every 4 (four) hours as needed for mild pain (or temp > 37.5 C (99.5 F)). 30 tablet 0   aspirin EC 81 MG tablet 1 tablet     cholecalciferol (VITAMIN D3) 25 MCG (1000 UNIT) tablet Take 1 tablet (1,000 Units total) by mouth daily. 30 tablet 0   DULoxetine (CYMBALTA) 30 MG capsule Take 1 capsule (30 mg total) by mouth daily. X 1 week, then 60 mg daily- for nerve pain 60 capsule 5   ferrous sulfate 325 (65 FE) MG tablet Take 1 tablet (325 mg total) by mouth daily with breakfast. 90 tablet 0   gabapentin (NEURONTIN) 400 MG capsule Take 1 capsule (400 mg total) by mouth 3 (three) times daily. For nerve pain (Patient taking differently: Take 400 mg by mouth 2 (two) times daily. For nerve pain) 90 capsule 5   Multiple Vitamin (MULTIVITAMIN) tablet Take 1 tablet by mouth daily.       olmesartan (BENICAR) 20 MG tablet Take 0.5 tablets (10 mg total) by mouth daily. 90 tablet 1   tamsulosin (FLOMAX) 0.4 MG CAPS capsule Take 1 capsule (0.4 mg total) by mouth daily after supper. 90 capsule 1   vitamin B-12 (CYANOCOBALAMIN) 1000 MCG tablet Take 1 tablet (1,000 mcg total) by mouth daily. 30 tablet 0   rosuvastatin (CRESTOR) 20 MG tablet Take 1 tablet (20 mg total) by mouth daily. 90 tablet 1   No facility-administered medications prior to visit.    ROS Review of Systems  Constitutional: Negative.  Negative for diaphoresis and fatigue.  HENT: Negative.  Negative for nosebleeds.   Eyes: Negative.   Respiratory:   Negative for cough, chest tightness, shortness of breath and wheezing.   Cardiovascular:  Negative for chest pain, palpitations and leg swelling.  Gastrointestinal:  Negative for abdominal pain, blood in stool, constipation, diarrhea, nausea and vomiting.  Endocrine: Negative.   Genitourinary: Negative.  Negative for difficulty urinating and hematuria.  Musculoskeletal: Negative.  Negative for arthralgias and myalgias.  Skin: Negative.  Negative for color change and pallor.  Neurological:  Negative for dizziness and weakness.  Hematological:  Negative for adenopathy. Does not bruise/bleed easily.  Psychiatric/Behavioral: Negative.      Objective:  BP 128/74 (BP Location: Left Arm, Patient Position: Sitting, Cuff Size: Large)   Pulse 60   Temp 97.9 F (36.6 C) (Oral)   Resp 16   Ht 5' 10"$  (1.778 m)   Wt 179 lb (81.2 kg)   SpO2 98%   BMI 25.68 kg/m   BP Readings from Last 3 Encounters:  06/14/22 128/74  03/19/22 134/81  02/26/22 131/68    Wt Readings from Last 3 Encounters:  06/14/22 179 lb (81.2 kg)  03/19/22 183 lb (83 kg)  02/26/22 184 lb (83.5 kg)    Physical Exam Vitals reviewed.  Constitutional:      Appearance: He is not ill-appearing.  HENT:     Nose: Nose normal.     Mouth/Throat:     Mouth: Mucous membranes are moist.  Eyes:     General: No scleral icterus.    Conjunctiva/sclera: Conjunctivae normal.  Cardiovascular:     Rate and Rhythm: Normal rate and regular rhythm.     Heart sounds: No murmur heard. Pulmonary:     Effort: Pulmonary effort is normal.     Breath sounds: No stridor. No wheezing, rhonchi or rales.  Abdominal:     General: Abdomen is flat.     Palpations: There is no mass.     Tenderness: There is no abdominal tenderness. There is no guarding.     Hernia: No hernia is present.  Musculoskeletal:        General: Normal range of motion.     Cervical back: Neck supple.     Right lower leg: No edema.     Left lower leg: No edema.   Lymphadenopathy:     Cervical: No cervical adenopathy.  Skin:    General: Skin is warm and dry.  Neurological:     General: No focal deficit present.     Mental Status: He is alert.  Psychiatric:        Mood and Affect: Mood normal.        Behavior: Behavior normal.     Lab Results  Component Value Date   WBC 6.1 06/14/2022   HGB 14.1 06/14/2022   HCT 40.5 06/14/2022   PLT 139.0 (L) 06/14/2022   GLUCOSE 93 06/14/2022   CHOL 133 06/14/2022   TRIG 105.0 06/14/2022   HDL 55.90 06/14/2022   LDLCALC 56 06/14/2022   ALT 25 06/14/2022   AST 23 06/14/2022   NA 140 06/14/2022   K 4.7 06/14/2022   CL 104 06/14/2022   CREATININE 1.20 06/14/2022   BUN 23 06/14/2022   CO2 28 06/14/2022   TSH 2.22 12/12/2021   PSA 1.98 12/12/2021   INR 0.9 07/01/2021   HGBA1C 5.5 07/02/2021    MR LUMBAR SPINE WO CONTRAST  Result Date: 03/24/2022 CLINICAL DATA:  Lumbar radiculopathy EXAM: MRI LUMBAR SPINE WITHOUT CONTRAST TECHNIQUE: Multiplanar, multisequence MR imaging of the lumbar spine was performed. No intravenous contrast was administered. COMPARISON:  None Available. FINDINGS: Segmentation:  Standard. Alignment:  Physiologic. Vertebrae:  No fracture, evidence of discitis, or bone lesion. Conus medullaris and cauda equina: Conus extends to the L1 level. Conus and cauda equina appear normal. Paraspinal and other soft tissues: Negative Disc levels: L1-L2: Normal disc space and facet joints. No spinal canal stenosis. No neural foraminal stenosis. L2-L3: Normal disc space and facet joints. No spinal canal stenosis. No neural foraminal stenosis. L3-L4: Small disc bulge. Mild spinal canal stenosis. No neural foraminal stenosis. L4-L5: Intermediate disc bulge with superimposed central protrusion. Moderate spinal canal stenosis. Mass effect on the left L5 nerve root in the lateral recess. No neural foraminal stenosis. L5-S1: Small central disc protrusion. No spinal canal stenosis. No neural foraminal  stenosis. Visualized sacrum: Normal. IMPRESSION: 1. Moderate L4-L5 spinal canal stenosis with mass effect on the left L5 nerve root in the lateral recess. 2. Mild L3-L4 spinal canal stenosis. Electronically Signed   By: Ulyses Jarred M.D.   On: 03/24/2022 23:35    Assessment & Plan:   Byren was seen today for hypertension, anemia and hyperlipidemia.  Diagnoses and all orders for this visit:  Thrombocytopenia, unspecified ([place])- His platelet count has improved. -     IBC + Ferritin; Future -     CBC with Differential/Platelet; Future -     CBC with Differential/Platelet -  IBC + Ferritin  Iron deficiency anemia secondary to inadequate dietary iron intake- His H&H and iron are normal now. -     CBC with Differential/Platelet; Future -     CBC with Differential/Platelet  Dyslipidemia, goal LDL below 70- LDL goal achieved. Doing well on the statin  -     Hepatic function panel; Future -     Lipid panel; Future -     Lipid panel -     Hepatic function panel  Primary hypertension- His blood pressure is adequately well-controlled. -     Basic metabolic panel; Future -     Basic metabolic panel  Other orders -     Pneumococcal polysaccharide vaccine 23-valent greater than or equal to 2yo subcutaneous/IM -     scopolamine (TRANSDERM-SCOP) 1 MG/3DAYS; Place 1 patch (1.5 mg total) onto the skin every 3 (three) days.   I am having Eddie Calhoun start on scopolamine. I am also having him maintain his multivitamin, acetaminophen, cholecalciferol, cyanocobalamin, aspirin EC, olmesartan, tamsulosin, ferrous sulfate, gabapentin, and DULoxetine.  Meds ordered this encounter  Medications   scopolamine (TRANSDERM-SCOP) 1 MG/3DAYS    Sig: Place 1 patch (1.5 mg total) onto the skin every 3 (three) days.    Dispense:  10 patch    Refill:  0     Follow-up: Return in about 4 months (around 10/13/2022).  Scarlette Calico, MD

## 2022-06-14 NOTE — Patient Instructions (Signed)
Thrombocytopenia Thrombocytopenia is a condition in which there are a low number of platelets in the blood. Platelets are also called thrombocytes. Platelets are parts of blood that stick together and form a clot to help the body stop bleeding after an injury. If you have too few platelets, your blood may have trouble clotting. This may cause you to bleed and bruise very easily. Some cases of thrombocytopenia are mild while others are more severe. What are the causes? This condition is caused by a low number of platelets in your blood. There are three main reasons for this: Your body not making enough platelets. This may be caused by: Bone marrow diseases. This include aplastic anemia, leukemia, and myelodysplastic anemia. Congenital thrombocytopenia. This is a condition that is passed from parent to child (inherited). Certain cancer treatments, including chemotherapy and radiation therapy. Infections from bacteria or viruses. Alcohol use disorder and alcoholism. Platelets not being released in the blood. This is called platelet sequestration and it can happen due to: An overactive spleen (hypersplenism). The spleen gathers up platelets from circulation, meaning that the platelets are not available to help with clotting your blood. The spleen can be enlarged because of scarring or other conditions. Gaucher disease. Your body destroying platelets too quickly. This may be caused by: An autoimmune disease that causes immune thrombocytopenia (ITP). ITP is sometimes associated with other autoimmune conditions such as lupus. Certain medicines, such as blood thinners. Certain blood clotting or bleeding disorders. Exposure to toxic chemicals, such as pesticides, lead, benzene, and arsenic. Pregnancy. What are the signs or symptoms? Symptoms of this condition are the result of poor blood clotting. They will vary depending on how low the platelet counts are. Symptoms may include: Bruising  easily. Bleeding from the mouth or nose. Heavy menstrual periods. Blood in the urine, stool (feces), or vomit. Purplish-red discolorations on the skin (purpura). A rash that looks like pinpoint, purplish-red spots (petechiae) on the lower legs. How is this diagnosed?  This condition may be diagnosed with blood tests and a physical exam. You may also have other tests, including: A sample of bone marrow (biopsy) may be removed to look for the original cells that make platelets. An ultrasound or CT scan of the abdomen to check for an enlarged spleen, enlarged lymph nodes, or liver problems. How is this treated? Treatment for this condition depends on the cause. Treatment may include: Treatment of another condition that is causing the low platelet count. Medicines to help protect your platelets from being destroyed. A replacement (transfusion) of platelets to stop or prevent bleeding. Surgery to remove the spleen. Follow these instructions at home: Medicines Take over-the-counter and prescription medicines only as told by your health care provider. Do not take any medicines that contain aspirin or NSAIDs, such as ibuprofen. These medicines increase your risk for dangerous bleeding. Activity Avoid activities that could cause injury or bruising, and follow instructions about how to prevent falls. Do not play contact sports. Ask your health care provider what activities are safe for you. Take extra care to protect yourself from burns when ironing or cooking. Take extra care not to cut yourself when you shave or when you use scissors, needles, knives, and other tools. General instructions  Check your skin and the inside of your mouth for bruising or bleeding as told by your health care provider. Wear a medical alert bracelet that says that you have a bleeding disorder. This can help you get the treatment you need in case of emergency. Check  your urine and stool for blood as told by your health  care provider. Do not drink alcohol. If you do drink alcohol, limit the amount that you drink. Minimize contact with toxic chemicals. Tell all your health care providers, including dental care providers and eye doctors, about your condition. Make sure to tell dental care providers before you have any procedure done, including dental cleanings. Keep all follow-up visits. This is important. Contact a health care provider if: You have unexplained bruising. You have new symptoms. You have symptoms that get worse. You have a fever. Get help right away if: You have severe bleeding from anywhere on your body. You have blood in your vomit, urine, or stool. You have an injury to your head. You have a sudden, severe headache. Summary Thrombocytopenia is a condition in which you have a low number of platelets in the blood. Platelets are parts of blood that stick together to form a clot. Symptoms of this condition are the result of poor blood clotting and may include bruising easily, bleeding from the nose or mouth, petechiae, and purpura. This condition may be diagnosed with blood tests and a physical exam. Treatment for this condition depends on the cause. This information is not intended to replace advice given to you by your health care provider. Make sure you discuss any questions you have with your health care provider. Document Revised: 10/06/2020 Document Reviewed: 10/06/2020 Elsevier Patient Education  Eddie Calhoun.

## 2022-06-15 MED ORDER — ROSUVASTATIN CALCIUM 20 MG PO TABS: 20.0000 mg | ORAL_TABLET | Freq: Every day | ORAL | 1 refills | Status: DC

## 2022-06-22 ENCOUNTER — Encounter
Payer: Medicare Other | Attending: Physical Medicine and Rehabilitation | Admitting: Physical Medicine and Rehabilitation

## 2022-06-22 ENCOUNTER — Encounter: Payer: Self-pay | Admitting: Physical Medicine and Rehabilitation

## 2022-06-22 VITALS — BP 145/85 | HR 60 | Ht 70.0 in | Wt 182.8 lb

## 2022-06-22 DIAGNOSIS — M722 Plantar fascial fibromatosis: Secondary | ICD-10-CM | POA: Diagnosis present

## 2022-06-22 DIAGNOSIS — M21372 Foot drop, left foot: Secondary | ICD-10-CM | POA: Diagnosis not present

## 2022-06-22 DIAGNOSIS — M6281 Muscle weakness (generalized): Secondary | ICD-10-CM | POA: Diagnosis not present

## 2022-06-22 DIAGNOSIS — G8114 Spastic hemiplegia affecting left nondominant side: Secondary | ICD-10-CM

## 2022-06-22 DIAGNOSIS — I639 Cerebral infarction, unspecified: Secondary | ICD-10-CM

## 2022-06-22 DIAGNOSIS — R269 Unspecified abnormalities of gait and mobility: Secondary | ICD-10-CM

## 2022-06-22 NOTE — Patient Instructions (Signed)
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 Also has lumbar radiculopathy which is improving with increased foot drop on L-   Would need to speak to Neurology about memory issues since having stroke puts him at risk for multiple infarct dementia.   2. Had loop recorder implanted- since last saw me- going well- con't.    3.  Went over long term prognosis- if didn't have strength at this time, wouldn't get better  4. Has spasticity, but doesn't get in way of function and it's not painful.  So no need for spasticity treatment.   5. Doesn't need L foot drop brace- due to improved strength.    6. Don't get rid of Cymbalta and gabapentin but call Neurosurgery if needs another shot.   7. For plantar fasciitis- soup can- [employer] soup can- do before you get out of bed- 30 seconds- tries to do every time. Roll in on arch.   8. Cholesterol is MUCH better-  HDL 56; LDL 56- total cholesterol 133- - con't 1 inch of wine and statin meds.   9. F/U in 6 months-

## 2022-06-22 NOTE — Progress Notes (Signed)
Subjective:    Patient ID: Eddie Calhoun, male    DOB: Jul 05, 1950, 72 y.o.   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 Also has lumbar radiculopathy which is improving with increased foot drop on L-   Doing better/well.   Doing much better with shot in back- With [facility] did L lumbar radiculopathy ESI. ESI was December 2023.  Can wear off, but hasn't so far.    Getting back to "almost" normal walking- goes "up and down".  "Carousel horse" per wife.   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  Concentrate on legs-   Knees not giving out anymore.  Toes coming back on L- and can stand on 1 for for 1 minute now (used to be at 30 seconds- now up to 1 minute). Can now vacuum again for a long period of time.   Could tie bow tie! Fine motor skills MUCH better- handwriting basically back to normal per wife.  Can take 2 coffeee cups to LR to wife again.   Does better when walking for awhile- when first gets up, sees exaggerated movements more.       Off all nerve pain meds  Asking about aquatic therapy! Cruise in May 2024.  To [place], [place], [place], [place], and [place]- 2+ weeks  Memory still an issue- per wife- as well as unfiltered moments.    If has been active during day, LLE will jerk when in bed reading.  Not as bad as before- a lot less! Has improved 90-95% from initial.       Pain Inventory Average Pain 0 Pain Right Now 0 My pain is  no pain  LOCATION OF PAIN  no pain  BOWEL Number of stools per week: 7 Oral laxative use No  Type of laxative . Enema or suppository use No  History of colostomy No  Incontinent No   BLADDER Normal In and out cath, frequency . Able to self cath  . Bladder incontinence No  Frequent urination No  Leakage with coughing No   Difficulty starting stream No  Incomplete bladder emptying No    Mobility walk without assistance how many minutes can you walk? 60+ ability to climb steps?  yes do you drive?  yes  Function retired  Neuro/Psych No problems in this area  Prior Studies Any changes since last visit?  no  Physicians involved in your care Any changes since last visit?  no   Family History  Problem Relation Age of Onset   Hypertension Mother    Colon cancer Father 7   Hypertension Father    Cancer Father 21       father died with hx of prostate cancer   Esophageal cancer Neg Hx    Rectal cancer Neg Hx    Stomach cancer Neg Hx    Social History   Socioeconomic History   Marital status: Married    Spouse name: Not on file   Number of children: Not on file   Years of education: Not on file   Highest education level: Not on file  Occupational History   Not on file  Tobacco Use   Smoking status: Former    Packs/day: 2.00    Years: 20.00    Total pack years: 40.00  Types: Cigarettes    Quit date: 09/21/1987    Years since quitting: 34.7   Smokeless tobacco: Never  Vaping Use   Vaping Use: Never used  Substance and Sexual Activity   Alcohol use: Yes    Comment: social   Drug use: Never   Sexual activity: Not Currently  Other Topics Concern   Not on file  Social History Narrative   Not on file   Social Determinants of Health   Financial Resource Strain: Not on file  Food Insecurity: Not on file  Transportation Needs: Not on file  Physical Activity: Not on file  Stress: Not on file  Social Connections: Not on file   Past Surgical History:  Procedure Laterality Date   CARPAL TUNNEL RELEASE Bilateral 04/2021   Past Medical History:  Diagnosis Date   Allergy    allergic rhinitis   BPH (benign prostatic hyperplasia)    Elevated prostate specific antigen (PSA)    Hypertension    Implantable loop recorder present 02/04/2022   Plantar fasciitis of right foot     Pneumonia    Thrombocytopenia, unspecified ([place]) 07/26/2012   03/24/12  129,000!   BP (!) 145/85   Pulse 60   Ht 5' 10"$  (1.778 m)   Wt 182 lb 12.8 oz (82.9 kg)   SpO2 98%   BMI 26.23 kg/m   Opioid Risk Score:   Fall Risk Score:  `1  Depression screen [facility] 2/9     03/19/2022    2:35 PM 01/23/2022    8:35 AM 12/12/2021    8:29 AM 12/04/2021   10:28 AM 08/23/2021   10:21 AM 07/28/2021    1:14 PM  Depression screen PHQ 2/9  Decreased Interest 0 0 0 0 0 0  Down, Depressed, Hopeless 0 0 0 0 0 0  PHQ - 2 Score 0 0 0 0 0 0  Altered sleeping  0    0  Tired, decreased energy  0    1  Change in appetite  0    0  Feeling bad or failure about yourself   0    0  Trouble concentrating  0    0  Moving slowly or fidgety/restless  0    3  Suicidal thoughts  0    0  PHQ-9 Score  0    4  Difficult doing work/chores  Not difficult at all         Review of Systems  All other systems reviewed and are negative.     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 Also has lumbar radiculopathy which is improving with increased foot drop on L-   Would need to speak to Neurology about  memory issues since having stroke puts him at risk for multiple infarct dementia.   2. Had loop recorder implanted- since last saw me- going well- con't.     3.  Went over long term prognosis- if didn't have strength at this time, wouldn't get better  4. Has spasticity, but doesn't get in way of function and it's not painful.  So no need for spasticity treatment.   5. Doesn't need L foot drop brace- due to improved strength.    6. Don't get rid of Cymbalta and gabapentin but call Neurosurgery if needs another shot.   7. For plantar fasciitis- soup can- campbell's soup can- do before you get out of bed- 30 seconds- tries to do every time. Roll in on arch.   8. Cholesterol is MUCH better-  HDL 56; LDL 56- total cholesterol 133- - con't 1 inch of wine and statin meds.   9. F/U in 6 months-   I spent a total of 42    minutes on total care today- >50% coordination of care- due to d/w pt about wife about memory; L foot drop- and plantar fasciitis.

## 2022-06-24 LAB — CUP PACEART REMOTE DEVICE CHECK
Date Time Interrogation Session: 20240215091427
Implantable Pulse Generator Implant Date: 20231005

## 2022-06-25 ENCOUNTER — Ambulatory Visit (INDEPENDENT_AMBULATORY_CARE_PROVIDER_SITE_OTHER): Payer: Medicare Other

## 2022-06-25 DIAGNOSIS — I639 Cerebral infarction, unspecified: Secondary | ICD-10-CM | POA: Diagnosis not present

## 2022-07-11 NOTE — Progress Notes (Signed)
Carelink Summary Report / Loop Recorder 

## 2022-07-13 ENCOUNTER — Other Ambulatory Visit: Payer: Self-pay | Admitting: Internal Medicine

## 2022-07-13 DIAGNOSIS — N401 Enlarged prostate with lower urinary tract symptoms: Secondary | ICD-10-CM

## 2022-07-30 ENCOUNTER — Ambulatory Visit (INDEPENDENT_AMBULATORY_CARE_PROVIDER_SITE_OTHER): Payer: Medicare Other

## 2022-07-30 DIAGNOSIS — I639 Cerebral infarction, unspecified: Secondary | ICD-10-CM | POA: Diagnosis not present

## 2022-07-30 LAB — CUP PACEART REMOTE DEVICE CHECK
Date Time Interrogation Session: 20240324232030
Implantable Pulse Generator Implant Date: 20231005

## 2022-08-09 NOTE — Progress Notes (Signed)
Carelink Summary Report / Loop Recorder 

## 2022-09-03 ENCOUNTER — Ambulatory Visit (INDEPENDENT_AMBULATORY_CARE_PROVIDER_SITE_OTHER): Payer: Medicare Other

## 2022-09-03 DIAGNOSIS — I639 Cerebral infarction, unspecified: Secondary | ICD-10-CM

## 2022-09-03 LAB — CUP PACEART REMOTE DEVICE CHECK
Date Time Interrogation Session: 20240426230322
Implantable Pulse Generator Implant Date: 20231005

## 2022-09-04 ENCOUNTER — Encounter: Payer: Self-pay | Admitting: Podiatry

## 2022-09-04 ENCOUNTER — Ambulatory Visit (INDEPENDENT_AMBULATORY_CARE_PROVIDER_SITE_OTHER): Payer: Medicare Other | Admitting: Podiatry

## 2022-09-04 DIAGNOSIS — M722 Plantar fascial fibromatosis: Secondary | ICD-10-CM

## 2022-09-04 NOTE — Progress Notes (Signed)
Subjective:  Patient ID: Eddie Calhoun, male    DOB: December 24, 1950,  MRN: [id number]  Chief Complaint  Patient presents with   Plantar Fasciitis    72 y.o. male presents with the above complaint.  Patient presents with right Plan[place] fasciitis.  Patient states the pain started coming back he had a stroke last year so he was not walking as much.  He denies any other acute complaints.  Nter medication none of which has helped.  He denies any other acute complaints.   Review of Systems: Negative except as noted in the HPI. Denies N/V/F/Ch.  Past Medical History:  Diagnosis Date   Allergy    allergic rhinitis   BPH (benign prostatic hyperplasia)    Elevated prostate specific antigen (PSA)    Hypertension    Implantable loop recorder present 02/04/2022   Plantar fasciitis of right foot    Pneumonia    Thrombocytopenia, unspecified ([facility]) 07/26/2012   03/24/12  129,000!    Current Outpatient Medications:    acetaminophen (TYLENOL) 325 MG tablet, Take 2 tablets (650 mg total) by mouth every 4 (four) hours as needed for mild pain (or temp > 37.5 C (99.5 F))., Disp: 30 tablet, Rfl: 0   aspirin EC 81 MG tablet, 1 tablet, Disp: , Rfl:    cholecalciferol (VITAMIN D3) 25 MCG (1000 UNIT) tablet, Take 1 tablet (1,000 Units total) by mouth daily., Disp: 30 tablet, Rfl: 0   DULoxetine (CYMBALTA) 30 MG capsule, Take 1 capsule (30 mg total) by mouth daily. X 1 week, then 60 mg daily- for nerve pain (Patient not taking: Reported on 06/22/2022), Disp: 60 capsule, Rfl: 5   gabapentin (NEURONTIN) 400 MG capsule, Take 1 capsule (400 mg total) by mouth 3 (three) times daily. For nerve pain (Patient not taking: Reported on 06/22/2022), Disp: 90 capsule, Rfl: 5   Multiple Vitamin (MULTIVITAMIN) tablet, Take 1 tablet by mouth daily.  , Disp: , Rfl:    olmesartan (BENICAR) 20 MG tablet, Take 0.5 tablets (10 mg total) by mouth daily., Disp: 90 tablet, Rfl: 1   rosuvastatin (CRESTOR) 20 MG tablet, Take 1 tablet (20  mg total) by mouth daily., Disp: 90 tablet, Rfl: 1   scopolamine (TRANSDERM-SCOP) 1 MG/3DAYS, Place 1 patch (1.5 mg total) onto the skin every 3 (three) days. (Patient not taking: Reported on 06/22/2022), Disp: 10 patch, Rfl: 0   tamsulosin (FLOMAX) 0.4 MG CAPS capsule, TAKE 1 CAPSULE BY MOUTH DAILY AFTER SUPPER, Disp: 90 capsule, Rfl: 1   vitamin B-12 (CYANOCOBALAMIN) 1000 MCG tablet, Take 1 tablet (1,000 mcg total) by mouth daily., Disp: 30 tablet, Rfl: 0  Social History   Tobacco Use  Smoking Status Former   Packs/day: 2.00   Years: 20.00   Additional pack years: 0.00   Total pack years: 40.00   Types: Cigarettes   Quit date: 09/21/1987   Years since quitting: 34.9  Smokeless Tobacco Never    Allergies  Allergen Reactions   Contrast Media [Iodinated Contrast Media] Hives   Dutasteride Other (See Comments)    Other reaction(s): itching Other reaction(s): itching   Objective:  There were no vitals filed for this visit. There is no height or weight on file to calculate BMI. Constitutional Well developed. Well nourished.  Vascular Dorsalis pedis pulses palpable bilaterally. Posterior tibial pulses palpable bilaterally. Capillary refill normal to all digits.  No cyanosis or clubbing noted. Pedal hair growth normal.  Neurologic Normal speech. Oriented to person, place, and time. Epicritic sensation to light  touch grossly present bilaterally.  Dermatologic Nails well groomed and normal in appearance. No open wounds. No skin lesions.  Orthopedic: Normal joint ROM without pain or crepitus bilaterally. No visible deformities. Tender to palpation at the calcaneal tuber right. No pain with calcaneal squeeze right. Ankle ROM diminished range of motion right. Silfverskiold Test: positive right.   Radiographs: Taken and reviewed. No acute fractures or dislocations. No evidence of stress fracture.  Plantar heel spur present. Posterior heel spur absent.   Assessment:   1. Plantar  fasciitis of right foot     Plan:  Patient was evaluated and treated and all questions answered.  Plantar Fasciitis, right~recurrence - XR reviewed as above.  - Educated on icing and stretching. Instructions given.  - Injection delivered to the plantar fascia as below. - DME: Plantar Fascial Brace - Pharmacologic management: None  Procedure: Injection Tendon/Ligament Location: Right plantar fascia at the glabrous junction; medial approach. Skin Prep: alcohol Injectate: 0.5 cc 0.5% marcaine plain, 0.5 cc of 1% Lidocaine, 0.5 cc kenalog 10. Disposition: Patient tolerated procedure well. Injection site dressed with a band-aid.  No follow-ups on file.

## 2022-09-11 NOTE — Progress Notes (Signed)
Carelink Summary Report / Loop Recorder 

## 2022-10-05 ENCOUNTER — Telehealth: Payer: Self-pay | Admitting: Radiology

## 2022-10-05 NOTE — Telephone Encounter (Signed)
Left voice mail for patient to call back at [phone] to schedule Medicare Annual Wellness Visit    Last AWV:  no hx of AWV   Please schedule Sequential/Initial AWV with [facility]   Carling Liberman K. CMA   

## 2022-10-05 NOTE — Progress Notes (Signed)
Carelink Summary Report / Loop Recorder 

## 2022-10-08 ENCOUNTER — Ambulatory Visit (INDEPENDENT_AMBULATORY_CARE_PROVIDER_SITE_OTHER): Payer: Medicare Other

## 2022-10-08 DIAGNOSIS — I639 Cerebral infarction, unspecified: Secondary | ICD-10-CM | POA: Diagnosis not present

## 2022-10-08 LAB — CUP PACEART REMOTE DEVICE CHECK
Date Time Interrogation Session: 20240602230623
Implantable Pulse Generator Implant Date: 20231005

## 2022-10-31 NOTE — Progress Notes (Signed)
Carelink Summary Report / Loop Recorder 

## 2022-11-12 ENCOUNTER — Ambulatory Visit (INDEPENDENT_AMBULATORY_CARE_PROVIDER_SITE_OTHER): Payer: Medicare Other

## 2022-11-12 DIAGNOSIS — I639 Cerebral infarction, unspecified: Secondary | ICD-10-CM

## 2022-11-12 LAB — CUP PACEART REMOTE DEVICE CHECK
Date Time Interrogation Session: 20240705230425
Implantable Pulse Generator Implant Date: 20231005

## 2022-11-26 ENCOUNTER — Encounter: Payer: Self-pay | Admitting: Neurology

## 2022-11-26 ENCOUNTER — Ambulatory Visit (INDEPENDENT_AMBULATORY_CARE_PROVIDER_SITE_OTHER): Payer: Medicare Other | Admitting: Neurology

## 2022-11-26 VITALS — BP 127/69 | HR 53 | Ht 70.0 in | Wt 182.6 lb

## 2022-11-26 DIAGNOSIS — R29898 Other symptoms and signs involving the musculoskeletal system: Secondary | ICD-10-CM | POA: Diagnosis not present

## 2022-11-26 DIAGNOSIS — M5416 Radiculopathy, lumbar region: Secondary | ICD-10-CM

## 2022-11-26 DIAGNOSIS — I6990 Unspecified sequelae of unspecified cerebrovascular disease: Secondary | ICD-10-CM | POA: Diagnosis not present

## 2022-11-26 NOTE — Patient Instructions (Addendum)
I had a long d/w patient about his remote stroke, mild residual left sided leg weakness and leg spasticity,risk for recurrent stroke/TIAs, personally independently reviewed imaging studies and stroke evaluation results and answered questions.Continue aspirin 81 mg daily  for secondary stroke prevention and maintain strict control of hypertension with blood pressure goal below 130/90, diabetes with hemoglobin A1c goal below 6.5% and lipids with LDL cholesterol goal below 70 mg/dL. I also advised the patient to eat a healthy diet with plenty of whole grains, cereals, fruits and vegetables, exercise regularly and maintain ideal body weight.  Check follow-up carotid ultrasound study.  Recommended patient follow-up f with his interventional pain physician for epidural steroid injections since he had great relief with it in the past..  Follow up in the future with me in 1 year or call earlier if necessary.

## 2022-11-26 NOTE — Progress Notes (Signed)
[facility] [address] [place]. [place] [address] [phone]       OFFICE FOLLOW UP VISIT NOTE  [name] Date of Birth:  25-Mar-1951 Medical Record Number:  [id number]   Referring MD:  Harvel Ricks PA-c  Reason for Referral:  Stroke  HPI: Initial visit 09/05/2021 : Eddie Calhoun is a 72 year old male seen today for initial office consultation visit for stroke.  He is accompanied by his wife.  History is obtained from them and review of electronic medical records and I have personally reviewed pertinent available imaging films in PACS.  He has a past medical history of hypertension, bilateral carpal tunnel surgeries, benign prostatic hypertrophy, seasonal allergies who presented on 07/01/2021 with sudden onset of left-sided heaviness involving face arm and leg and weakness.  He had difficulty raising his left arm and leg.  He called EMS who noticed facial asymmetry and significant left-sided weakness and activated a code stroke.  Noncontrast CT scan of the head was negative for any acute process.  Given recent carpal tunnel surgery there was careful discussion about risk benefits of IV TNK and patient agreed to proceed and this was administered with the door to needle time of only 9 minutes.  Patient did remarkably well and started improving shortly thereafter.  MRI scan of the brain showed a moderate-sized large basal ganglia and corona radiata infarct.  MR angiogram of the brain showed no significant proximal large vessel stenosis but did show some irregularities in the right P3 P4 segments.  Echocardiogram showed normal ejection fraction of 60 to 65% without any clot.  LDL cholesterol was 79 mg percent.  Hemoglobin A1c was 5.5.  MR angiogram of the neck showed no extracranial large vessel stenosis.  Patient started on dual antiplatelet therapy aspirin and Plavix and transferred to inpatient rehab and did really well he is currently at home and walking independently.  He  is doing still outpatient physical and Occupational Therapy.  His left hand strength is improved significantly though fine motor skills are still diminished.  His left foot is still heavy and drags and he has to wear an ankle-foot brace.  His on aspirin tolerating well without bruising or bleeding.  His blood pressure is under good control.  He is tolerating Crestor well without muscle aches or pains.  He has not had any recurrent stroke or TIA symptoms. Update 11/15/2021 ; He returns for follow-up after last visit 2 months ago.  Patient states he is doing well.  This is finished outpatient physical and Occupational Therapy.  Still has some diminished fine motor skills in the left foot with some numbness and and but is walking independently.  He is tolerating aspirin well without bruising or bleeding.  His planning to see a personal trainer at the gym facility to continue to improve further.  Blood pressures well controlled today it is 114/67.  He remains on Crestor which is tolerating well without muscle aches and pains.  He has an appointment coming to see Dr. Ladona Ridgel to discuss loop recorder.  His insurance company refused it without first 2030-day heart monitor which was done on 09/20/2021 and showed no evidence of A-fib.  He has no new complaints today. Update 11/26/2022 : He returns for follow-up after last visit a year ago.  He states he is doing well without any recurrent stroke or TIA symptoms.  He remains on aspirin which is tolerating well without bruising or bleeding.  He is tolerating Crestor well without muscle aches  and pains.  Last lipid profile in 06/14/2022 showed LDL 56 mg%.  He has a loop recorder and interrogation on 11/09/2022 has not yet shown A-fib.  His blood pressure remains under good control though it is elevated slightly in the office today.  He has been eating a healthy diet and has lost weight which is now quite steady at 175 pounds.  He is quite active and walks a couple of miles a day as  well as goes to the gym frequently.  He continues to have mild left leg weakness in fact he had some leg pain and underwent MRI of the lumbar spine on 03/23/2022 which showed moderate spinal stenosis at L4-5 with left L5 root compression.  He underwent epidural steroid injection with good relief of his symptoms for several months but now his left leg seems to be bothering him again. ROS:   14 system review of systems is positive for slurred speech, foot numbness, weakness all other systems negative  PMH:  Past Medical History:  Diagnosis Date   Allergy    allergic rhinitis   BPH (benign prostatic hyperplasia)    Elevated prostate specific antigen (PSA)    Hypertension    Implantable loop recorder present 02/04/2022   Plantar fasciitis of right foot    Pneumonia    Thrombocytopenia, unspecified ([facility]) 07/26/2012   03/24/12  129,000!    Social History:  Social History   Socioeconomic History   Marital status: Married    Spouse name: Not on file   Number of children: Not on file   Years of education: Not on file   Highest education level: Not on file  Occupational History   Not on file  Tobacco Use   Smoking status: Former    Current packs/day: 0.00    Average packs/day: 2.0 packs/day for 20.0 years (40.0 ttl pk-yrs)    Types: Cigarettes    Start date: 09/21/1967    Quit date: 09/21/1987    Years since quitting: 35.2   Smokeless tobacco: Never  Vaping Use   Vaping status: Never Used  Substance and Sexual Activity   Alcohol use: Yes    Comment: social   Drug use: Never   Sexual activity: Not Currently  Other Topics Concern   Not on file  Social History Narrative   Not on file   Social Determinants of Health   Financial Resource Strain: Not on file  Food Insecurity: Not on file  Transportation Needs: Not on file  Physical Activity: Not on file  Stress: Not on file  Social Connections: Not on file  Intimate Partner Violence: Not on file    Medications:   Current  Outpatient Medications on File Prior to Visit  Medication Sig Dispense Refill   acetaminophen (TYLENOL) 325 MG tablet Take 2 tablets (650 mg total) by mouth every 4 (four) hours as needed for mild pain (or temp > 37.5 C (99.5 F)). 30 tablet 0   aspirin EC 81 MG tablet 1 tablet     cholecalciferol (VITAMIN D3) 25 MCG (1000 UNIT) tablet Take 1 tablet (1,000 Units total) by mouth daily. 30 tablet 0   Multiple Vitamin (MULTIVITAMIN) tablet Take 1 tablet by mouth daily.       olmesartan (BENICAR) 20 MG tablet Take 0.5 tablets (10 mg total) by mouth daily. 90 tablet 1   rosuvastatin (CRESTOR) 20 MG tablet Take 1 tablet (20 mg total) by mouth daily. 90 tablet 1   scopolamine (TRANSDERM-SCOP) 1 MG/3DAYS Place 1  patch (1.5 mg total) onto the skin every 3 (three) days. 10 patch 0   tamsulosin (FLOMAX) 0.4 MG CAPS capsule TAKE 1 CAPSULE BY MOUTH DAILY AFTER SUPPER 90 capsule 1   vitamin B-12 (CYANOCOBALAMIN) 1000 MCG tablet Take 1 tablet (1,000 mcg total) by mouth daily. 30 tablet 0   No current facility-administered medications on file prior to visit.    Allergies:   Allergies  Allergen Reactions   Contrast Media [Iodinated Contrast Media] Hives   Dutasteride Other (See Comments)    Other reaction(s): itching Other reaction(s): itching    Physical Exam General: well developed, well nourished pleasant elderly male, seated, in no evident distress Head: head normocephalic and atraumatic.   Neck: supple with no carotid or supraclavicular bruits Cardiovascular: regular rate and rhythm, no murmurs Musculoskeletal: no deformity Skin:  no rash/petichiae Vascular:  Normal pulses all extremities  Neurologic Exam Mental Status: Awake and fully alert. Oriented to place and time. Recent and remote memory intact. Attention span, concentration and fund of knowledge appropriate. Mood and affect appropriate.  Cranial Nerves: Fundoscopic exam not done. Pupils equal, briskly reactive to light. Extraocular  movements full without nystagmus. Visual fields full to confrontation. Hearing intact. Facial sensation intact. Face, tongue, palate moves normally and symmetrically.  Motor: Normal bulk and tone. Normal strength in all tested extremity muscles.  Except slight diminished fine finger movements on the left.  Orbits right over left upper extremity.  Mild ankle dorsiflexor weakness on the left.  Tone is increased in the left leg.  Mild increase in tone in the left leg with spasticity. Sensory.: intact to touch , pinprick , position and vibratory sensation.  Coordination: Rapid alternating movements normal in all extremities. Finger-to-nose and heel-to-shin performed accurately bilaterally.  Unsteady while standing on left foot unsupported. Gait and Station: Arises from chair without difficulty. Stance is normal. Gait demonstrates  stiffness and dragging of the left leg..  Tandem walking not tested.   Reflexes: 1+ and asymmetric and brisker on the left. Toes downgoing.   NIHSS  0 Modified Rankin  2   ASSESSMENT: 72 year old male with large right subcortical infarct in February 2023 likely of cryptogenic etiology.  Vascular risk factors of hyperlipidemia and hypertension.  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 I also advised the patient to eat a healthy diet with plenty of whole grains, cereals, fruits and vegetables,  exercise regularly and maintain ideal body weight.  Check follow-up carotid ultrasound study.  Recommended patient follow-up f with his interventional pain physician for epidural steroid injections since he had great relief with it in the past..  Follow up in the future with me in 1 year or call earlier if necessary.Greater than 50% time during this 35-minute consultation visit was spent on counseling and coordination of care about his hemiparesis and cryptogenic stroke and discussion about evaluation and treatment plan and answering questions.  Delia Heady, MD Note: This document was prepared with digital dictation and  possible smart Lobbyist. Any transcriptional errors that result from this process are unintentional.

## 2022-11-29 NOTE — Progress Notes (Signed)
Carelink Summary Report / Loop Recorder 

## 2022-12-05 ENCOUNTER — Ambulatory Visit (HOSPITAL_COMMUNITY)
Admission: RE | Admit: 2022-12-05 | Discharge: 2022-12-05 | Disposition: A | Discharge status: Home / Self Care | Payer: Medicare Other | Source: Ambulatory Visit | Attending: Neurology | Admitting: Neurology

## 2022-12-05 DIAGNOSIS — I6990 Unspecified sequelae of unspecified cerebrovascular disease: Secondary | ICD-10-CM | POA: Diagnosis present

## 2022-12-12 ENCOUNTER — Other Ambulatory Visit: Payer: Self-pay | Admitting: Internal Medicine

## 2022-12-12 DIAGNOSIS — I10 Essential (primary) hypertension: Secondary | ICD-10-CM

## 2022-12-12 NOTE — Progress Notes (Signed)
Kindly inform the patient that carotid ultrasound study shows no significant narrowing of either carotid arteries in the neck

## 2022-12-17 ENCOUNTER — Ambulatory Visit (INDEPENDENT_AMBULATORY_CARE_PROVIDER_SITE_OTHER): Payer: Medicare Other

## 2022-12-17 DIAGNOSIS — I639 Cerebral infarction, unspecified: Secondary | ICD-10-CM | POA: Diagnosis not present

## 2022-12-24 ENCOUNTER — Encounter
Payer: Medicare Other | Attending: Physical Medicine and Rehabilitation | Admitting: Physical Medicine and Rehabilitation

## 2022-12-24 ENCOUNTER — Encounter: Payer: Self-pay | Admitting: Physical Medicine and Rehabilitation

## 2022-12-24 VITALS — BP 138/81 | HR 54 | Ht 70.0 in | Wt 178.0 lb

## 2022-12-24 DIAGNOSIS — M6281 Muscle weakness (generalized): Secondary | ICD-10-CM | POA: Insufficient documentation

## 2022-12-24 DIAGNOSIS — M21372 Foot drop, left foot: Secondary | ICD-10-CM | POA: Insufficient documentation

## 2022-12-24 DIAGNOSIS — R269 Unspecified abnormalities of gait and mobility: Secondary | ICD-10-CM | POA: Diagnosis present

## 2022-12-24 DIAGNOSIS — G8114 Spastic hemiplegia affecting left nondominant side: Secondary | ICD-10-CM | POA: Diagnosis present

## 2022-12-24 NOTE — Progress Notes (Signed)
Subjective:    Patient ID: Eddie Calhoun, male    DOB: May 16, 1950, 72 y.o.   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 Also has lumbar radiculopathy which is improving with increased foot drop on L-     Did cruise- 2 weeks- [place] to [place].  1st cruise.  [place].   Lots of walking No problem- able to walk- 5 miles/day when on cruise.   Trying to walk more at home- tending to swing L leg in circumduction.  Thinks due to knee not coming up as much, more than Toes being weak.   Breaking down step so  Feels like walking in neighborhood- 2 miles/day- thinks going faster than before- 17.5 min/mile. Getting endurance up. Trying to keep strength up- and doing up/down hills as well.  Concentrating on less foot drop on downhill portions.   After walks, somewhat tired-   Saw Dr Pearlean Brownie- ankle and foot quite a bit weaker in July-  Not having any pain anymore- from L4/5 radiculopathy.   Got another Lumbar radi L4/5- Dr Lorrine Kin did it- 1 month ago- end of July-  Helped right away-  Knee was a lot better- could stand on toes now on L leg.   Not as good as now- but still better.   Went 9 months without injection- so hopeful will do ~ 9 months.     Still has temperature differential- and mild weakness LUE.  Certain positions will spasm. Doing yard work- so using LUE regularly.  Fine motor skills OK in L hand as well.   Is L handed, so L handwriting "still terrible"- Can sign name- looks like it did before.   Can print and do cursive and looks similar.    Cries out of nowhere- still- like watching TV show- at appropriate times.    Put in name at 4 different places- independent locations- [facility], [facility] , etc. Looking into that And put names on list.   Pain  Inventory Average Pain 0 Pain Right Now 0 My pain is  Denies Pain  LOCATION OF PAIN  Numbness in toes on the left foot  BOWEL Number of stools per week: 7   BLADDER Normal    Mobility ability to climb steps?  yes do you drive?  yes Do you have any goals in this area?  yes  Function retired Do you have any goals in this area?  yes  Neuro/Psych weakness numbness trouble walking spasms  Prior Studies Any changes since last visit?  yes Carotid study  Physicians involved in your care Any changes since last visit?  no   Family History  Problem Relation Age of Onset   Hypertension Mother    Colon cancer Father 50   Hypertension Father    Cancer Father 13       father died with hx of prostate cancer   Esophageal cancer Neg Hx    Rectal cancer Neg Hx    Stomach cancer Neg Hx    Social History   Socioeconomic History   Marital status: Married    Spouse name: Not on file   Number of children: Not on file   Years of education: Not on file   Highest education level: Not on file  Occupational History  Not on file  Tobacco Use   Smoking status: Former    Current packs/day: 0.00    Average packs/day: 2.0 packs/day for 20.0 years (40.0 ttl pk-yrs)    Types: Cigarettes    Start date: 09/21/1967    Quit date: 09/21/1987    Years since quitting: 35.2   Smokeless tobacco: Never  Vaping Use   Vaping status: Never Used  Substance and Sexual Activity   Alcohol use: Yes    Comment: social   Drug use: Never   Sexual activity: Not Currently  Other Topics Concern   Not on file  Social History Narrative   Not on file   Social Determinants of Health   Financial Resource Strain: Not on file  Food Insecurity: Not on file  Transportation Needs: Not on file  Physical Activity: Not on file  Stress: Not on file  Social Connections: Not on file   Past Surgical History:  Procedure Laterality Date   CARPAL TUNNEL RELEASE Bilateral 04/2021   Past Medical History:   Diagnosis Date   Allergy    allergic rhinitis   BPH (benign prostatic hyperplasia)    Elevated prostate specific antigen (PSA)    Hypertension    Implantable loop recorder present 02/04/2022   Plantar fasciitis of right foot    Pneumonia    Thrombocytopenia, unspecified (HCC) 07/26/2012   03/24/12  129,000!   BP 138/81   Pulse (!) 54   Ht 5\' 10"  (1.778 m)   Wt 178 lb (80.7 kg)   SpO2 97%   BMI 25.54 kg/m   Opioid Risk Score:   Fall Risk Score:  `1  Depression screen [facility] 2/9     12/24/2022    9:13 AM 03/19/2022    2:35 PM 01/23/2022    8:35 AM 12/12/2021    8:29 AM 12/04/2021   10:28 AM 08/23/2021   10:21 AM 07/28/2021    1:14 PM  Depression screen PHQ 2/9  Decreased Interest 0 0 0 0 0 0 0  Down, Depressed, Hopeless 0 0 0 0 0 0 0  PHQ - 2 Score 0 0 0 0 0 0 0  Altered sleeping   0    0  Tired, decreased energy   0    1  Change in appetite   0    0  Feeling bad or failure about yourself    0    0  Trouble concentrating   0    0  Moving slowly or fidgety/restless   0    3  Suicidal thoughts   0    0  PHQ-9 Score   0    4  Difficult doing work/chores   Not difficult at all        Review of Systems  Musculoskeletal:  Positive for gait problem.       Left knee locking  Neurological:  Positive for weakness and numbness.       Spasms  All other systems reviewed and are negative.      Objective:   Physical Exam  Awake, alert, appropriate, Ox3, NAD Doesn't have signs of pseudobulbar affect right now.   MS; 5-/5 in WE, grip and FA; but Deltoid, Biceps and triceps 5/5 on LUE 5/5 in RUE RLE- 5/5 in HF, KE, KF,  DF and PF LLE-  5-/5 on KF on LLE- otherwise 5/5   Neuro: Hoffman's LUE, not RUE 2-3 beats clonus LLE Patella 4+= with crossing on L- 2+ on R Achilles-  3+ on L- 2+ on R  Gait- walks slightly stiffly Mild circumduction on LLE Mild foot slap on LLE    Assessment & Plan:   Pt is a 72 yr old L handed  male with R BG/corona radiata lacunar infarct in  March 2023- with L hemiparesis; HTN, chronic thrombocytopenia and BPH and recent B/L carpal tunnel surgeries;  Here for f/u on Stroke. Also has lumbar radiculopathy which is improving with increased foot drop on L-   Describes mild pseudobulbar affect-  Mild spasticity  We discussed Celexa for for mild Pseudobulbar affect- but decided not to treat right now. But we discussed and pt aware of Symptoms.   2.  Doesn't like meds- wait on Baclofen- but do Range of motion- 2-3x/day-  Full ROM of all joints in LUE/LLE- 5-10 reps. 2-3x/day- to help with stiffness- and can help with function.    3.  I don't prescribe meds for him- doesn't need refills.    4.  F/U in 6 months-    I spent a total of  41  minutes on total care today- >50% coordination of care- due to  discussion about spasticity, ROM demonstration; also discussion and education PBA-  and decided against meds.   Discussed medical issues at length.

## 2022-12-24 NOTE — Patient Instructions (Signed)
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 Also has lumbar radiculopathy which is improving with increased foot drop on L-   Describes mild pseudobulbar affect-  Mild spasticity  We discussed Celexa for for mild Pseudobulbar affect- but decided not to treat right now. But we discussed and pt aware of Symptoms.   2.  Doesn't like meds- wait on Baclofen- but do Range of motion- 2-3x/day-  Full ROM of all joints in LUE/LLE- 5-10 reps. 2-3x/day- to help with stiffness- and can help with function.    3.  I don't prescribe meds for him- doesn't need refills.    4.  F/U in 6 months-

## 2023-01-01 ENCOUNTER — Ambulatory Visit: Payer: Medicare Other

## 2023-01-01 VITALS — Ht 70.0 in | Wt 177.0 lb

## 2023-01-01 DIAGNOSIS — Z0000 Encounter for general adult medical examination without abnormal findings: Secondary | ICD-10-CM

## 2023-01-01 NOTE — Progress Notes (Signed)
Carelink Summary Report / Loop Recorder 

## 2023-01-01 NOTE — Patient Instructions (Addendum)
Mr. Mehle , Thank you for taking time to come for your Medicare Wellness Visit. I appreciate your ongoing commitment to your health goals. Please review the following plan we discussed and let me know if I can assist you in the future.   Referrals/Orders/Follow-Ups/Clinician Recommendations: No  This is a list of the screening recommended for you and due dates:  Health Maintenance  Topic Date Due   Flu Shot  12/06/2022   Medicare Annual Wellness Visit  01/01/2024   Colon Cancer Screening  02/26/2025   DTaP/Tdap/Td vaccine (3 - Td or Tdap) 09/28/2026   Pneumonia Vaccine  Completed   Hepatitis C Screening  Completed   Zoster (Shingles) Vaccine  Completed   HPV Vaccine  Aged Out   COVID-19 Vaccine  Discontinued    Advanced directives: (Copy Requested) Please bring a copy of your health care power of attorney and living will to the office to be added to your chart at your convenience.  Next Medicare Annual Wellness Visit scheduled for next year: Yes

## 2023-01-01 NOTE — Progress Notes (Signed)
Subjective:   Eddie Calhoun is a 72 y.o. male who presents for an Initial Medicare Annual Wellness Visit.  Visit Complete: Virtual  I connected with  Eddie Calhoun on 01/01/23 by a audio enabled telemedicine application and verified that I am speaking with the correct person using two identifiers.  Patient Location: Home  Provider Location: Office/Clinic  I discussed the limitations of evaluation and management by telemedicine. The patient expressed understanding and agreed to proceed.  Patient Medicare AWV questionnaire was completed by the patient on 12/31/2022; I have confirmed that all information answered by patient is correct and no changes since this date.  Vital Signs: Because this visit was a virtual/telehealth visit, some criteria may be missing or patient reported. Any vitals not documented were not able to be obtained and vitals that have been documented are patient reported.   Review of Systems     Cardiac Risk Factors include: advanced age (>61men, >24 women);dyslipidemia;family history of premature cardiovascular disease;hypertension;male gender     Objective:    Today's Vitals   01/01/23 1305 01/01/23 1306  Weight: 177 lb (80.3 kg)   Height: 5\' 10"  (1.778 m)   PainSc: 0-No pain 0-No pain   Body mass index is 25.4 kg/m.     01/01/2023    1:07 PM 07/18/2021    5:41 PM 07/18/2021   12:35 PM 07/04/2021    3:32 PM 07/02/2021    1:25 AM  Advanced Directives  Does Patient Have a Medical Advance Directive? Yes Yes Yes Yes Yes  Type of Estate agent of [place];Living will Living will;Healthcare Power of Attorney  Living will;Healthcare Power of Attorney Living will;Healthcare Power of Attorney  Does patient want to make changes to medical advance directive?    No - Patient declined No - Patient declined  Copy of Healthcare Power of Attorney in Chart? No - copy requested   No - copy requested     Current Medications (verified) Outpatient  Encounter Medications as of 01/01/2023  Medication Sig   acetaminophen (TYLENOL) 325 MG tablet Take 2 tablets (650 mg total) by mouth every 4 (four) hours as needed for mild pain (or temp > 37.5 C (99.5 F)).   amoxicillin (AMOXIL) 500 MG capsule Take 1,000 mg by mouth 2 (two) times daily. (Patient not taking: Reported on 12/24/2022)   aspirin EC 81 MG tablet 1 tablet   cholecalciferol (VITAMIN D3) 25 MCG (1000 UNIT) tablet Take 1 tablet (1,000 Units total) by mouth daily.   Multiple Vitamin (MULTIVITAMIN) tablet Take 1 tablet by mouth daily.     olmesartan (BENICAR) 20 MG tablet TAKE 1/2 TABLET BY MOUTH DAILY   rosuvastatin (CRESTOR) 20 MG tablet Take 1 tablet (20 mg total) by mouth daily.   scopolamine (TRANSDERM-SCOP) 1 MG/3DAYS Place 1 patch (1.5 mg total) onto the skin every 3 (three) days. (Patient not taking: Reported on 12/24/2022)   tamsulosin (FLOMAX) 0.4 MG CAPS capsule TAKE 1 CAPSULE BY MOUTH DAILY AFTER SUPPER   vitamin B-12 (CYANOCOBALAMIN) 1000 MCG tablet Take 1 tablet (1,000 mcg total) by mouth daily.   No facility-administered encounter medications on file as of 01/01/2023.    Allergies (verified) Contrast media [iodinated contrast media] and Dutasteride   History: Past Medical History:  Diagnosis Date   Allergy    allergic rhinitis   BPH (benign prostatic hyperplasia)    Elevated prostate specific antigen (PSA)    Hypertension    Implantable loop recorder present 02/04/2022   Plantar fasciitis  of right foot    Pneumonia    Thrombocytopenia, unspecified (HCC) 07/26/2012   03/24/12  129,000!   Past Surgical History:  Procedure Laterality Date   CARPAL TUNNEL RELEASE Bilateral 04/2021   Family History  Problem Relation Age of Onset   Hypertension Mother    Colon cancer Father 77   Hypertension Father    Cancer Father 95       father died with hx of prostate cancer   Esophageal cancer Neg Hx    Rectal cancer Neg Hx    Stomach cancer Neg Hx    Social History    Socioeconomic History   Marital status: Married    Spouse name: Not on file   Number of children: Not on file   Years of education: Not on file   Highest education level: Not on file  Occupational History   Not on file  Tobacco Use   Smoking status: Former    Current packs/day: 0.00    Average packs/day: 2.0 packs/day for 20.0 years (40.0 ttl pk-yrs)    Types: Cigarettes    Start date: 09/21/1967    Quit date: 09/21/1987    Years since quitting: 35.3   Smokeless tobacco: Never  Vaping Use   Vaping status: Never Used  Substance and Sexual Activity   Alcohol use: Yes    Comment: social   Drug use: Never   Sexual activity: Not Currently  Other Topics Concern   Not on file  Social History Narrative   Not on file   Social Determinants of Health   Financial Resource Strain: Low Risk  (01/01/2023)   Overall Financial Resource Strain (CARDIA)    Difficulty of Paying Living Expenses: Not hard at all  Food Insecurity: No Food Insecurity (01/01/2023)   Hunger Vital Sign    Worried About Running Out of Food in the Last Year: Never true    Ran Out of Food in the Last Year: Never true  Transportation Needs: No Transportation Needs (01/01/2023)   PRAPARE - Administrator, Civil Service (Medical): No    Lack of Transportation (Non-Medical): No  Physical Activity: Sufficiently Active (01/01/2023)   Exercise Vital Sign    Days of Exercise per Week: 6 days    Minutes of Exercise per Session: 40 min  Stress: No Stress Concern Present (01/01/2023)   [employer] of Occupational Health - Occupational Stress Questionnaire    Feeling of Stress : Not at all  Social Connections: Unknown (01/01/2023)   Social Connection and Isolation Panel [NHANES]    Frequency of Communication with Friends and Family: Twice a week    Frequency of Social Gatherings with Friends and Family: Twice a week    Attends Religious Services: Patient unable to answer    Active Member of Clubs or  Organizations: Yes    Attends Banker Meetings: 1 to 4 times per year    Marital Status: Married    Tobacco Counseling Counseling given: Not Answered   Clinical Intake:  Pre-visit preparation completed: Yes  Pain : No/denies pain Pain Score: 0-No pain     BMI - recorded: 25.4 Nutritional Status: BMI 25 -29 Overweight Nutritional Risks: None Diabetes: No  How often do you need to have someone help you when you read instructions, pamphlets, or other written materials from your doctor or pharmacy?: 1 - Never What is the last grade level you completed in school?: 2 YEARS OF COLLEGE  Interpreter Needed?: No  Information entered  by :: Brand Males. Aarya Quebedeaux, LPN.   Activities of Daily Living    01/01/2023    1:08 PM 12/31/2022    3:05 PM  In your present state of health, do you have any difficulty performing the following activities:  Hearing? 0 0  Vision? 0 0  Difficulty concentrating or making decisions? 0 0  Walking or climbing stairs? 0 0  Dressing or bathing? 0 0  Doing errands, shopping? 0 0  Preparing Food and eating ? N N  Using the Toilet? N N  In the past six months, have you accidently leaked urine? N N  Do you have problems with loss of bowel control? N N  Managing your Medications? N N  Managing your Finances? N N  Housekeeping or managing your Housekeeping? N N    Patient Care Team: Etta Grandchild, MD as PCP - General (Internal Medicine) Jimmey Ralph Forest Becker, DO as Consulting Physician (Ophthalmology)  Indicate any recent Medical Services you may have received from other than [facility] providers in the past year (date may be approximate).     Assessment:   This is a routine wellness examination for [place].  Hearing/Vision screen Hearing Screening - Comments:: Patient denied any hearing difficulty.   No hearing aids.  Vision Screening - Comments:: Patient does wear corrective lenses/contacts.  Annual [facility] exam done by: [facility]   Dietary issues and exercise activities discussed:     Goals Addressed               This Visit's Progress     Patient Stated (pt-stated)        My healthcare goal for 2024-2025 is to maintain my current health status by continuing to eat healthy, stay independent, physically and socially active.      Depression Screen    01/01/2023    1:07 PM 12/24/2022    9:13 AM 03/19/2022    2:35 PM 01/23/2022    8:35 AM 12/12/2021    8:29 AM 12/04/2021   10:28 AM 08/23/2021   10:21 AM  PHQ 2/9 Scores  PHQ - 2 Score 0 0 0 0 0 0 0  PHQ- 9 Score 0   0       Fall Risk    01/01/2023    1:07 PM 12/31/2022    3:05 PM 12/24/2022    9:13 AM 06/22/2022    1:50 PM 03/19/2022    2:35 PM  Fall Risk   Falls in the past year? 0 0 0 0 0  Number falls in past yr: 0 0 0    Injury with Fall? 0 0 0    Risk for fall due to : No Fall Risks      Follow up Falls prevention discussed        MEDICARE RISK AT HOME: Medicare Risk at Home Any stairs in or around the home?: Yes If so, are there any without handrails?: Yes Home free of loose throw rugs in walkways, pet beds, electrical cords, etc?: Yes Adequate lighting in your home to reduce risk of falls?: Yes Life alert?: No Use of a cane, walker or w/c?: No Grab bars in the bathroom?: Yes Shower chair or bench in shower?: Yes Elevated toilet seat or a handicapped toilet?: No  TIMED UP AND GO:  Was the test performed? No    Cognitive Function:        01/01/2023    1:17 PM  6CIT Screen  What Year? 0 points  What month? 0 points  What time? 0 points  Count back from 20 0 points  Months in reverse 0 points  Repeat phrase 0 points  Total Score 0 points    Immunizations Immunization History  Administered Date(s) Administered   PFIZER(Purple Top)SARS-COV-2 Vaccination 06/11/2019, 07/06/2019   Pneumococcal Conjugate-13 12/14/2015   Pneumococcal Polysaccharide-23 01/21/2017, 06/14/2022   Tdap 01/16/2007, 09/27/2016   Zoster  Recombinant(Shingrix) 08/14/2017, 12/25/2017    TDAP status: Up to date  Flu Vaccine status: Due, Education has been provided regarding the importance of this vaccine. Advised may receive this vaccine at local pharmacy or Health Dept. Aware to provide a copy of the vaccination record if obtained from local pharmacy or Health Dept. Verbalized acceptance and understanding.  Pneumococcal vaccine status: Up to date  Covid-19 vaccine status: Completed vaccines  Qualifies for Shingles Vaccine? Yes   Zostavax completed Yes   Shingrix Completed?: Yes  Screening Tests Health Maintenance  Topic Date Due   INFLUENZA VACCINE  12/06/2022   Medicare Annual Wellness (AWV)  01/01/2024   Colonoscopy  02/26/2025   DTaP/Tdap/Td (3 - Td or Tdap) 09/28/2026   Pneumonia Vaccine 51+ Years old  Completed   Hepatitis C Screening  Completed   Zoster Vaccines- Shingrix  Completed   HPV VACCINES  Aged Out   COVID-19 Vaccine  Discontinued    Health Maintenance  Health Maintenance Due  Topic Date Due   INFLUENZA VACCINE  12/06/2022    Colorectal cancer screening: Type of screening: Colonoscopy. Completed 02/26/2022. Repeat every 3 years  Lung Cancer Screening: (Low Dose CT Chest recommended if Age 35-80 years, 20 pack-year currently smoking OR have quit w/in 15years.) does not qualify.   Lung Cancer Screening Referral: no  Additional Screening:  Hepatitis C Screening: does qualify; Completed 01/23/2022  Vision Screening: Recommended annual ophthalmology exams for early detection of glaucoma and other disorders of the eye. Is the patient up to date with their annual eye exam?  Yes  Who is the provider or what is the name of the office in which the patient attends annual eye exams? Blair Hailey, DO. If pt is not established with a provider, would they like to be referred to a provider to establish care? No .   Dental Screening: Recommended annual dental exams for proper oral hygiene  Diabetic  Foot Exam: N/A  Community Resource Referral / Chronic Care Management: CRR required this visit?  No   CCM required this visit?  No    Plan:     I have personally reviewed and noted the following in the patient's chart:   Medical and social history Use of alcohol, tobacco or illicit drugs  Current medications and supplements including opioid prescriptions. Patient is not currently taking opioid prescriptions. Functional ability and status Nutritional status Physical activity Advanced directives List of other physicians Hospitalizations, surgeries, and ER visits in previous 12 months Vitals Screenings to include cognitive, depression, and falls Referrals and appointments  In addition, I have reviewed and discussed with patient certain preventive protocols, quality metrics, and best practice recommendations. A written personalized care plan for preventive services as well as general preventive health recommendations were provided to patient.     Mickeal Needy, LPN   6/38/7564   After Visit Summary: (Mail) Due to this being a telephonic visit, the after visit summary with patients personalized plan was offered to patient via mail   Nurse Notes: Normal cognitive status assessed by direct observation via telephone conversation by this Nurse Health  Advisor. No abnormalities found.

## 2023-01-12 ENCOUNTER — Other Ambulatory Visit: Payer: Self-pay | Admitting: Internal Medicine

## 2023-01-12 DIAGNOSIS — N401 Enlarged prostate with lower urinary tract symptoms: Secondary | ICD-10-CM

## 2023-01-21 ENCOUNTER — Ambulatory Visit (INDEPENDENT_AMBULATORY_CARE_PROVIDER_SITE_OTHER): Payer: Medicare Other

## 2023-01-21 DIAGNOSIS — I639 Cerebral infarction, unspecified: Secondary | ICD-10-CM

## 2023-01-22 LAB — CUP PACEART REMOTE DEVICE CHECK
Date Time Interrogation Session: 20240913230534
Implantable Pulse Generator Implant Date: 20231005

## 2023-02-06 NOTE — Progress Notes (Signed)
Carelink Summary Report / Loop Recorder 

## 2023-02-25 ENCOUNTER — Ambulatory Visit (INDEPENDENT_AMBULATORY_CARE_PROVIDER_SITE_OTHER): Payer: Medicare Other

## 2023-02-25 DIAGNOSIS — I639 Cerebral infarction, unspecified: Secondary | ICD-10-CM | POA: Diagnosis not present

## 2023-02-25 LAB — CUP PACEART REMOTE DEVICE CHECK
Date Time Interrogation Session: 20241020231500
Implantable Pulse Generator Implant Date: 20231005

## 2023-03-02 ENCOUNTER — Other Ambulatory Visit: Payer: Self-pay | Admitting: Medical Genetics

## 2023-03-02 DIAGNOSIS — Z006 Encounter for examination for normal comparison and control in clinical research program: Secondary | ICD-10-CM

## 2023-03-05 ENCOUNTER — Other Ambulatory Visit: Payer: Self-pay | Admitting: Internal Medicine

## 2023-03-05 DIAGNOSIS — I10 Essential (primary) hypertension: Secondary | ICD-10-CM

## 2023-03-09 ENCOUNTER — Other Ambulatory Visit: Payer: Self-pay | Admitting: Internal Medicine

## 2023-03-09 DIAGNOSIS — I10 Essential (primary) hypertension: Secondary | ICD-10-CM

## 2023-03-11 ENCOUNTER — Telehealth: Payer: Self-pay | Admitting: Internal Medicine

## 2023-03-11 NOTE — Telephone Encounter (Signed)
Patient has been scheduled for an office visit to have his medication refill due to Dr. Yetta Barre denying it

## 2023-03-11 NOTE — Telephone Encounter (Signed)
Prescription Request  03/11/2023  LOV: 06/14/2022 Pt is requesting for  a temporary refill and appointment was made.  What is the name of the medication or equipment? olmesartan (BENICAR) 20 MG tablet [   Have you contacted your pharmacy to request a refill? No   Which pharmacy would you like this sent to?     [id number] - [place], [place] - [address] [place] [address] Phone: [phone] Fax: [phone]  Patient notified that their request is being sent to the clinical staff for review and that they should receive a response within 2 business days.   Please advise at Mobile [phone] (mobile)

## 2023-03-14 NOTE — Progress Notes (Signed)
Carelink Summary Report / Loop Recorder 

## 2023-03-20 ENCOUNTER — Encounter: Payer: Self-pay | Admitting: Internal Medicine

## 2023-03-20 ENCOUNTER — Ambulatory Visit: Payer: Medicare Other | Admitting: Internal Medicine

## 2023-03-20 VITALS — BP 130/78 | HR 55 | Temp 97.8°F | Resp 16 | Ht 70.0 in | Wt 185.8 lb

## 2023-03-20 DIAGNOSIS — E785 Hyperlipidemia, unspecified: Secondary | ICD-10-CM | POA: Diagnosis not present

## 2023-03-20 DIAGNOSIS — N401 Enlarged prostate with lower urinary tract symptoms: Secondary | ICD-10-CM

## 2023-03-20 DIAGNOSIS — R3912 Poor urinary stream: Secondary | ICD-10-CM

## 2023-03-20 DIAGNOSIS — I10 Essential (primary) hypertension: Secondary | ICD-10-CM

## 2023-03-20 DIAGNOSIS — D696 Thrombocytopenia, unspecified: Secondary | ICD-10-CM

## 2023-03-20 LAB — PSA: PSA: 2.43 ng/mL (ref 0.10–4.00)

## 2023-03-20 LAB — FOLATE: Folate: >24.2 ng/mL (ref >5.9)

## 2023-03-20 LAB — VITAMIN B12: Vitamin B-12: 511 pg/mL (ref 211–911)

## 2023-03-20 NOTE — Progress Notes (Signed)
Subjective:  Patient ID: Eddie Calhoun, male    DOB: 1950/07/24  Age: 72 y.o. MRN: [id number]  CC: Hypertension and Hyperlipidemia   HPI Eddie Calhoun presents for f/up -----  Discussed the use of AI scribe software for clinical note transcription with the patient, who gave verbal consent to proceed.  History of Present Illness   The patient, with a history of low platelets, stroke, and bradycardia, reports no significant bleeding or bruising, attributing minor bruising to physical activities such as yard work. He denies any symptoms of dizziness or lightheadedness, noting that his heart rate typically ranges from 55 to 60 bpm, and can decrease to 40 bpm during sleep, without any associated symptoms.  The patient remains active, engaging in regular walking and yard work, despite residual weakness in the left leg following a stroke. He reports no chest pain, shortness of breath, or cold sweats during exertion. He has received his annual flu shot and COVID-19 vaccination.  No new stroke symptoms, such as numbness, weakness, or tingling, have been reported.  The patient's blood pressure has been stable, with readings taken once or twice a week, typically around 130/70. He recently had a dental implant due to a cracked tooth. The patient also notes that the left side of his body remains cooler than the right, presumably a residual effect of his stroke.       Outpatient Medications Prior to Visit  Medication Sig Dispense Refill   acetaminophen (TYLENOL) 325 MG tablet Take 2 tablets (650 mg total) by mouth every 4 (four) hours as needed for mild pain (or temp > 37.5 C (99.5 F)). 30 tablet 0   aspirin EC 81 MG tablet 1 tablet     cholecalciferol (VITAMIN D3) 25 MCG (1000 UNIT) tablet Take 1 tablet (1,000 Units total) by mouth daily. 30 tablet 0   Multiple Vitamin (MULTIVITAMIN) tablet Take 1 tablet by mouth daily.       rosuvastatin (CRESTOR) 20 MG tablet Take 1 tablet (20 mg total) by  mouth daily. 90 tablet 1   tamsulosin (FLOMAX) 0.4 MG CAPS capsule TAKE 1 CAPSULE BY MOUTH DAILY AFTER SUPPER 90 capsule 0   vitamin B-12 (CYANOCOBALAMIN) 1000 MCG tablet Take 1 tablet (1,000 mcg total) by mouth daily. 30 tablet 0   olmesartan (BENICAR) 20 MG tablet TAKE 1/2 TABLET BY MOUTH DAILY 45 tablet 0   amoxicillin (AMOXIL) 500 MG capsule Take 1,000 mg by mouth 2 (two) times daily. (Patient not taking: Reported on 12/24/2022)     scopolamine (TRANSDERM-SCOP) 1 MG/3DAYS Place 1 patch (1.5 mg total) onto the skin every 3 (three) days. 10 patch 0   No facility-administered medications prior to visit.    ROS Review of Systems  Constitutional:  Negative for diaphoresis and fatigue.  HENT: Negative.    Eyes: Negative.   Respiratory: Negative.  Negative for cough, chest tightness, shortness of breath and wheezing.   Cardiovascular:  Negative for chest pain, palpitations and leg swelling.  Gastrointestinal:  Negative for abdominal pain, diarrhea, nausea and vomiting.  Endocrine: Negative.   Genitourinary: Negative.  Negative for difficulty urinating.  Musculoskeletal: Negative.  Negative for arthralgias and myalgias.  Skin: Negative.   Neurological:  Positive for weakness.  Hematological:  Negative for adenopathy. Does not bruise/bleed easily.  Psychiatric/Behavioral: Negative.      Objective:  BP 130/78 (BP Location: Left Arm, Patient Position: Sitting, Cuff Size: Normal)   Pulse (!) 55   Temp 97.8 F (36.6 C) (Oral)  Resp 16   Ht 5\' 10"  (1.778 m)   Wt 185 lb 12.8 oz (84.3 kg)   SpO2 96%   BMI 26.66 kg/m   BP Readings from Last 3 Encounters:  03/20/23 130/78  12/24/22 138/81  11/26/22 127/69    Wt Readings from Last 3 Encounters:  03/20/23 185 lb 12.8 oz (84.3 kg)  01/01/23 177 lb (80.3 kg)  12/24/22 178 lb (80.7 kg)    Physical Exam Vitals reviewed.  HENT:     Mouth/Throat:     Mouth: Mucous membranes are moist.  Eyes:     General: No scleral icterus.     Conjunctiva/sclera: Conjunctivae normal.  Cardiovascular:     Rate and Rhythm: Regular rhythm. Bradycardia present.     Pulses: Normal pulses.     Heart sounds: No murmur heard.    No friction rub. No gallop.  Pulmonary:     Effort: Pulmonary effort is normal.     Breath sounds: No stridor. No wheezing, rhonchi or rales.  Abdominal:     General: Abdomen is flat.     Tenderness: There is no abdominal tenderness. There is no guarding.     Hernia: No hernia is present.  Musculoskeletal:        General: Normal range of motion.     Right lower leg: No edema.     Left lower leg: No edema.  Skin:    General: Skin is warm.  Neurological:     Mental Status: Mental status is at baseline.  Psychiatric:        Mood and Affect: Mood normal.        Behavior: Behavior normal.     Lab Results  Component Value Date   WBC 6.1 06/14/2022   HGB 14.1 06/14/2022   HCT 40.5 06/14/2022   PLT 139.0 (L) 06/14/2022   GLUCOSE 93 06/14/2022   CHOL 133 06/14/2022   TRIG 105.0 06/14/2022   HDL 55.90 06/14/2022   LDLCALC 56 06/14/2022   ALT 25 06/14/2022   AST 23 06/14/2022   NA 140 06/14/2022   K 4.7 06/14/2022   CL 104 06/14/2022   CREATININE 1.20 06/14/2022   BUN 23 06/14/2022   CO2 28 06/14/2022   TSH 2.22 12/12/2021   PSA 2.43 03/20/2023   INR 0.9 07/01/2021   HGBA1C 5.5 07/02/2021    VAS US CAROTID  Result Date: 12/06/2022 Carotid Arterial Duplex Study Patient Name:  Eddie Calhoun  Date of Exam:   12/05/2022 Medical Rec #: [id number]        Accession #:    [id number] Date of Birth: April 14, 1951        Patient Gender: M Patient Age:   69 years Exam Location:  [facility] Procedure:      VAS US CAROTID Referring Phys: PRAMOD SETHI --------------------------------------------------------------------------------  Indications:      Hx of CVA. Risk Factors:     Hypertension, hyperlipidemia, past history of smoking, prior                   CVA. Comparison Study: No previous carotid duplex  exams. MRA on 07/02/21 WNL Performing Technologist: Ernestene Mention RVT, RDMS  Examination Guidelines: A complete evaluation includes B-mode imaging, spectral Doppler, color Doppler, and power Doppler as needed of all accessible portions of each vessel. Bilateral testing is considered an integral part of a complete examination. Limited examinations for reoccurring indications may be performed as noted.  Right Carotid Findings: +----------+--------+--------+--------+------------------+------------------+  PSV cm/sEDV cm/sStenosisPlaque DescriptionComments           +----------+--------+--------+--------+------------------+------------------+ CCA Prox  96      11                                intimal thickening +----------+--------+--------+--------+------------------+------------------+ CCA Distal91      21                                intimal thickening +----------+--------+--------+--------+------------------+------------------+ ICA Prox  76      17                                tortuous           +----------+--------+--------+--------+------------------+------------------+ ICA Distal79      26                                                   +----------+--------+--------+--------+------------------+------------------+ ECA       129     11                                                   +----------+--------+--------+--------+------------------+------------------+ +----------+--------+-------+--------+-------------------+           PSV cm/sEDV cmsDescribeArm Pressure (mmHG) +----------+--------+-------+--------+-------------------+ [id number]            Stenotic                    +----------+--------+-------+--------+-------------------+ +---------+--------+--+--------+-+---------+ VertebralPSV cm/s35EDV cm/s9Antegrade +---------+--------+--+--------+-+---------+  Left Carotid Findings:  +----------+--------+--------+--------+------------------+------------------+           PSV cm/sEDV cm/sStenosisPlaque DescriptionComments           +----------+--------+--------+--------+------------------+------------------+ CCA Prox  106     17                                intimal thickening +----------+--------+--------+--------+------------------+------------------+ CCA Distal102     19                                intimal thickening +----------+--------+--------+--------+------------------+------------------+ ICA Prox  52      18                                                   +----------+--------+--------+--------+------------------+------------------+ ICA Distal67      18                                                   +----------+--------+--------+--------+------------------+------------------+ ECA       86      12                                                   +----------+--------+--------+--------+------------------+------------------+ +----------+--------+--------+----------------+-------------------+  PSV cm/sEDV cm/sDescribe        Arm Pressure (mmHG) +----------+--------+--------+----------------+-------------------+ [id number]             Multiphasic, WNL                    +----------+--------+--------+----------------+-------------------+ +---------+--------+--+--------+--+---------+ VertebralPSV cm/s42EDV cm/s13Antegrade +---------+--------+--+--------+--+---------+   Summary: Right Carotid: The extracranial vessels were near-normal with only minimal wall                thickening or plaque. Left Carotid: The extracranial vessels were near-normal with only minimal wall               thickening or plaque. Vertebrals:  Bilateral vertebral arteries demonstrate antegrade flow. Subclavians: Right subclavian artery was stenotic. Normal flow hemodynamics were              seen in the left subclavian artery. *See table(s)  above for measurements and observations.  Electronically signed by Delia Heady MD on 12/06/2022 at 11:10:18 AM.    Final     Assessment & Plan:   Benign prostatic hyperplasia with weak urinary stream - PSA is normal. -     PSA; Future  Thrombocytopenia, unspecified (HCC)- There has been no bleeding/bruising. -     Vitamin B12; Future -     Folate; Future -     CBC with Differential/Platelet; Future  Dyslipidemia, goal LDL below 70 - LDL goal achieved. Doing well on the statin  -     Lipid panel; Future  Primary hypertension - His BP is well controlled. -     Olmesartan Medoxomil; Take 0.5 tablets (10 mg total) by mouth daily.  Dispense: 45 tablet; Refill: 1     Follow-up: Return in about 6 months (around 09/17/2023).  Sanda Linger, MD

## 2023-03-20 NOTE — Patient Instructions (Signed)
Thrombocytopenia Thrombocytopenia is a condition in which there are a low number of platelets in the blood. Platelets are also called thrombocytes. Platelets are parts of blood that stick together and form a clot to help the body stop bleeding after an injury. If you have too few platelets, your blood may have trouble clotting. This may cause you to bleed and bruise very easily. Some cases of thrombocytopenia are mild while others are more severe. What are the causes? This condition is caused by a low number of platelets in your blood. There are three main reasons for this: Your body not making enough platelets. This may be caused by: Bone marrow diseases. This include aplastic anemia, leukemia, and myelodysplastic anemia. Congenital thrombocytopenia. This is a condition that is passed from parent to child (inherited). Certain cancer treatments, including chemotherapy and radiation therapy. Infections from bacteria or viruses. Alcohol use disorder and alcoholism. Platelets not being released in the blood. This is called platelet sequestration and it can happen due to: An overactive spleen (hypersplenism). The spleen gathers up platelets from circulation, meaning that the platelets are not available to help with clotting your blood. The spleen can be enlarged because of scarring or other conditions. Gaucher disease. Your body destroying platelets too quickly. This may be caused by: An autoimmune disease that causes immune thrombocytopenia (ITP). ITP is sometimes associated with other autoimmune conditions such as lupus. Certain medicines, such as blood thinners. Certain blood clotting or bleeding disorders. Exposure to toxic chemicals, such as pesticides, lead, benzene, and arsenic. Pregnancy. What are the signs or symptoms? Symptoms of this condition are the result of poor blood clotting. They will vary depending on how low the platelet counts are. Symptoms may include: Bruising  easily. Bleeding from the mouth or nose. Heavy menstrual periods. Blood in the urine, stool (feces), or vomit. Purplish-red discolorations on the skin (purpura). A rash that looks like pinpoint, purplish-red spots (petechiae) on the lower legs. How is this diagnosed?  This condition may be diagnosed with blood tests and a physical exam. You may also have other tests, including: A sample of bone marrow (biopsy) may be removed to look for the original cells that make platelets. An ultrasound or CT scan of the abdomen to check for an enlarged spleen, enlarged lymph nodes, or liver problems. How is this treated? Treatment for this condition depends on the cause. Treatment may include: Treatment of another condition that is causing the low platelet count. Medicines to help protect your platelets from being destroyed. A replacement (transfusion) of platelets to stop or prevent bleeding. Surgery to remove the spleen. Follow these instructions at home: Medicines Take over-the-counter and prescription medicines only as told by your health care provider. Do not take any medicines that contain aspirin or NSAIDs, such as ibuprofen. These medicines increase your risk for dangerous bleeding. Activity Avoid activities that could cause injury or bruising, and follow instructions about how to prevent falls. Do not play contact sports. Ask your health care provider what activities are safe for you. Take extra care to protect yourself from burns when ironing or cooking. Take extra care not to cut yourself when you shave or when you use scissors, needles, knives, and other tools. General instructions  Check your skin and the inside of your mouth for bruising or bleeding as told by your health care provider. Wear a medical alert bracelet that says that you have a bleeding disorder. This can help you get the treatment you need in case of emergency. Check   your urine and stool for blood as told by your health  care provider. Do not drink alcohol. If you do drink alcohol, limit the amount that you drink. Minimize contact with toxic chemicals. Tell all your health care providers, including dental care providers and eye doctors, about your condition. Make sure to tell dental care providers before you have any procedure done, including dental cleanings. Keep all follow-up visits. This is important. Contact a health care provider if: You have unexplained bruising. You have new symptoms. You have symptoms that get worse. You have a fever. Get help right away if: You have severe bleeding from anywhere on your body. You have blood in your vomit, urine, or stool. You have an injury to your head. You have a sudden, severe headache. Summary Thrombocytopenia is a condition in which you have a low number of platelets in the blood. Platelets are parts of blood that stick together to form a clot. Symptoms of this condition are the result of poor blood clotting and may include bruising easily, bleeding from the nose or mouth, petechiae, and purpura. This condition may be diagnosed with blood tests and a physical exam. Treatment for this condition depends on the cause. This information is not intended to replace advice given to you by your health care provider. Make sure you discuss any questions you have with your health care provider. Document Revised: 10/06/2020 Document Reviewed: 10/06/2020 Elsevier Patient Education  2024 Elsevier Inc.  

## 2023-03-21 ENCOUNTER — Other Ambulatory Visit: Payer: Self-pay | Admitting: Internal Medicine

## 2023-03-21 DIAGNOSIS — I10 Essential (primary) hypertension: Secondary | ICD-10-CM

## 2023-03-21 MED ORDER — OLMESARTAN MEDOXOMIL 20 MG PO TABS: 10.0000 mg | ORAL_TABLET | Freq: Every day | ORAL | 1 refills | Status: DC

## 2023-03-25 ENCOUNTER — Telehealth: Payer: Self-pay | Admitting: Internal Medicine

## 2023-03-25 NOTE — Telephone Encounter (Signed)
Patient has picked up his medication per the pharmacist at the pharmacy.

## 2023-03-25 NOTE — Telephone Encounter (Signed)
Patients pharmacy - did not receive the olmesartain that was sent on the 14th.  Please resend.  Patient wants to pick up today.

## 2023-04-01 ENCOUNTER — Ambulatory Visit (INDEPENDENT_AMBULATORY_CARE_PROVIDER_SITE_OTHER): Payer: Medicare Other

## 2023-04-01 DIAGNOSIS — I639 Cerebral infarction, unspecified: Secondary | ICD-10-CM | POA: Diagnosis not present

## 2023-04-01 LAB — CUP PACEART REMOTE DEVICE CHECK
Date Time Interrogation Session: 20241122230430
Implantable Pulse Generator Implant Date: 20231005

## 2023-04-02 ENCOUNTER — Encounter: Payer: Self-pay | Admitting: Internal Medicine

## 2023-04-02 ENCOUNTER — Ambulatory Visit: Payer: Medicare Other | Attending: Internal Medicine | Admitting: Internal Medicine

## 2023-04-02 VITALS — BP 122/76 | HR 50 | Ht 70.0 in | Wt 185.0 lb

## 2023-04-02 DIAGNOSIS — I10 Essential (primary) hypertension: Secondary | ICD-10-CM | POA: Insufficient documentation

## 2023-04-02 NOTE — Progress Notes (Signed)
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 He had a stroke over a year ago. He has worn a cardiac monitor which did not show any atrial fib. He has HTN and asymptomatic sinus node dysfunction. No syncope. He denies chest pain or sob and is active exercising regularly. No other neuro symptoms. He appears to have had a nice recovery from his stroke. He is s/p ILR insertion over a year ago.  Allergies  Allergen Reactions   Contrast Media [Iodinated Contrast Media] Hives   Dutasteride Other (See Comments)    Other reaction(s): itching Other reaction(s): itching     Current Outpatient Medications  Medication Sig Dispense Refill   acetaminophen (TYLENOL) 325 MG tablet Take 2 tablets (650 mg total) by mouth every 4 (four) hours as needed for mild pain (or temp > 37.5 C (99.5 F)). 30 tablet 0   aspirin EC 81 MG tablet 1 tablet     cholecalciferol (VITAMIN D3) 25 MCG (1000 UNIT) tablet Take 1 tablet (1,000 Units total) by mouth daily. 30 tablet 0   Multiple Vitamin (MULTIVITAMIN) tablet Take 1 tablet by mouth daily.       olmesartan (BENICAR) 20 MG tablet Take 0.5 tablets (10 mg total) by mouth daily. 45 tablet 1   rosuvastatin (CRESTOR) 20 MG tablet Take 1 tablet (20 mg total) by mouth daily. 90 tablet 1   tamsulosin (FLOMAX) 0.4 MG CAPS capsule TAKE 1 CAPSULE BY MOUTH DAILY AFTER SUPPER 90 capsule 0   vitamin B-12 (CYANOCOBALAMIN) 1000 MCG tablet Take 1 tablet (1,000 mcg total) by mouth daily. 30 tablet 0   No current facility-administered medications for this visit.     Past Medical History:  Diagnosis Date   Allergy    allergic rhinitis   BPH (benign prostatic hyperplasia)    Elevated prostate specific antigen (PSA)    Hypertension    Implantable loop recorder present 02/04/2022   Plantar fasciitis of right foot    Pneumonia    Thrombocytopenia, unspecified (HCC) 07/26/2012   03/24/12  129,000!    ROS:   All systems  reviewed and negative except as noted in the HPI.   Past Surgical History:  Procedure Laterality Date   CARPAL TUNNEL RELEASE Bilateral 04/2021     Family History  Problem Relation Age of Onset   Hypertension Mother    Colon cancer Father 76   Hypertension Father    Cancer Father 105       father died with hx of prostate cancer   Esophageal cancer Neg Hx    Rectal cancer Neg Hx    Stomach cancer Neg Hx      Social History   Socioeconomic History   Marital status: Married    Spouse name: Not on file   Number of children: Not on file   Years of education: Not on file   Highest education level: Associate degree: occupational, Scientist, product/process development, or vocational program  Occupational History   Not on file  Tobacco Use   Smoking status: Former    Current packs/day: 0.00    Average packs/day: 2.0 packs/day for 20.0 years (40.0 ttl pk-yrs)    Types: Cigarettes    Start date: 09/21/1967    Quit date: 09/21/1987    Years since quitting: 35.5   Smokeless tobacco: Never  Vaping Use   Vaping status: Never Used  Substance and Sexual Activity   Alcohol use: Yes    Comment:  social   Drug use: Never   Sexual activity: Not Currently  Other Topics Concern   Not on file  Social History Narrative   Not on file   Social Determinants of Health   Financial Resource Strain: Low Risk  (03/16/2023)   Overall Financial Resource Strain (CARDIA)    Difficulty of Paying Living Expenses: Not hard at all  Food Insecurity: No Food Insecurity (03/16/2023)   Hunger Vital Sign    Worried About Running Out of Food in the Last Year: Never true    Ran Out of Food in the Last Year: Never true  Transportation Needs: No Transportation Needs (03/16/2023)   PRAPARE - Administrator, Civil Service (Medical): No    Lack of Transportation (Non-Medical): No  Physical Activity: Sufficiently Active (03/16/2023)   Exercise Vital Sign    Days of Exercise per Week: 6 days    Minutes of Exercise per Session:  30 min  Stress: No Stress Concern Present (03/16/2023)   [employer] of Occupational Health - Occupational Stress Questionnaire    Feeling of Stress : Not at all  Social Connections: Socially Integrated (03/16/2023)   Social Connection and Isolation Panel [NHANES]    Frequency of Communication with Friends and Family: Twice a week    Frequency of Social Gatherings with Friends and Family: More than three times a week    Attends Religious Services: More than 4 times per year    Active Member of [employer] or Organizations: Yes    Attends Engineer, structural: More than 4 times per year    Marital Status: Married  Catering manager Violence: Not on file     BP 122/76   Pulse (!) 50   Ht 5\' 10"  (1.778 m)   Wt 185 lb (83.9 kg)   SpO2 97%   BMI 26.54 kg/m   Physical Exam:  Well appearing NAD HEENT: Unremarkable Neck:  No JVD, no thyromegally Lymphatics:  No adenopathy Back:  No CVA tenderness Lungs:  Clear with no wheezes HEART:  Regular rate rhythm, no murmurs, no rubs, no clicks Abd:  soft, positive bowel sounds, no organomegally, no rebound, no guarding Ext:  2 plus pulses, no edema, no cyanosis, no clubbing Skin:  No rashes no nodules Neuro:  CN II through XII intact, motor grossly intact  EKG - nsr  DEVICE  Normal device function.  See PaceArt for details. No afib on iLR   Assess/Plan:  Cryptogenic stroke -He is s/p ILR and has not had any atrial fib. He will undergo watchful waiting. HTN - his bp is well controlled. No change in his meds.

## 2023-04-02 NOTE — Patient Instructions (Signed)

## 2023-04-08 ENCOUNTER — Ambulatory Visit: Payer: Medicare Other | Admitting: Internal Medicine

## 2023-04-19 ENCOUNTER — Telehealth: Payer: Self-pay | Admitting: Medical Genetics

## 2023-04-19 ENCOUNTER — Other Ambulatory Visit (INDEPENDENT_AMBULATORY_CARE_PROVIDER_SITE_OTHER): Payer: Medicare Other

## 2023-04-19 DIAGNOSIS — E785 Hyperlipidemia, unspecified: Secondary | ICD-10-CM | POA: Diagnosis not present

## 2023-04-19 DIAGNOSIS — D696 Thrombocytopenia, unspecified: Secondary | ICD-10-CM

## 2023-04-19 LAB — LIPID PANEL
Cholesterol: 130 mg/dL (ref 0–200)
HDL: 57.7 mg/dL (ref >39.00)
LDL Cholesterol: 58 mg/dL (ref 0–99)
NonHDL: 72.18
Total CHOL/HDL Ratio: 2
Triglycerides: 69 mg/dL (ref 0.0–149.0)
VLDL: 13.8 mg/dL (ref 0.0–40.0)

## 2023-04-19 LAB — CBC WITH DIFFERENTIAL/PLATELET
Basophils Absolute: 0 10*3/uL (ref 0.0–0.1)
Basophils Relative: 0.5 % (ref 0.0–3.0)
Eosinophils Absolute: 0.4 10*3/uL (ref 0.0–0.7)
Eosinophils Relative: 8.7 % — ABNORMAL HIGH (ref 0.0–5.0)
HCT: 39.8 % (ref 39.0–52.0)
Hemoglobin: 13.7 g/dL (ref 13.0–17.0)
Lymphocytes Relative: 22.7 % (ref 12.0–46.0)
Lymphs Abs: 0.9 10*3/uL (ref 0.7–4.0)
MCHC: 34.4 g/dL (ref 30.0–36.0)
MCV: 103.2 fL — ABNORMAL HIGH (ref 78.0–100.0)
Monocytes Absolute: 0.3 10*3/uL (ref 0.1–1.0)
Monocytes Relative: 8.3 % (ref 3.0–12.0)
Neutro Abs: 2.5 10*3/uL (ref 1.4–7.7)
Neutrophils Relative %: 59.8 % (ref 43.0–77.0)
Platelets: 120 10*3/uL — ABNORMAL LOW (ref 150.0–400.0)
RBC: 3.86 Mil/uL — ABNORMAL LOW (ref 4.22–5.81)
RDW: 12.7 % (ref 11.5–15.5)
WBC: 4.1 10*3/uL (ref 4.0–10.5)

## 2023-04-19 NOTE — Telephone Encounter (Signed)
[facility]  December 13th, 2024 9:50 AM  Confirmed I was speaking with OVERTON SAMBERG [phone] by using name and phone number. Informed participant the reason for this call is to follow-up on a recent request to withdrawal from [employer]. Informed participant of the difference between a withdrawal versus a deletion and asked which they would like to request. Participant requested a deletion. Informed participant that their request will be processed through [employer], their orders will be canceled in their EMR, and their study status will be updated to "withdrawn". Participant was pleased with the actions to be taken. Participant was thanked for their time.

## 2023-04-20 ENCOUNTER — Other Ambulatory Visit: Payer: Self-pay | Admitting: Internal Medicine

## 2023-04-20 DIAGNOSIS — N401 Enlarged prostate with lower urinary tract symptoms: Secondary | ICD-10-CM

## 2023-04-24 ENCOUNTER — Encounter: Payer: Self-pay | Admitting: Internal Medicine

## 2023-05-02 NOTE — Progress Notes (Signed)
Carelink Summary Report / Loop Recorder 

## 2023-05-06 ENCOUNTER — Ambulatory Visit (INDEPENDENT_AMBULATORY_CARE_PROVIDER_SITE_OTHER): Payer: Medicare Other

## 2023-05-06 DIAGNOSIS — I639 Cerebral infarction, unspecified: Secondary | ICD-10-CM

## 2023-05-06 LAB — CUP PACEART REMOTE DEVICE CHECK
Date Time Interrogation Session: 20241229231509
Implantable Pulse Generator Implant Date: 20231005

## 2023-05-10 ENCOUNTER — Encounter
Payer: Medicare Other | Attending: Physical Medicine and Rehabilitation | Admitting: Physical Medicine and Rehabilitation

## 2023-05-10 ENCOUNTER — Encounter: Payer: Self-pay | Admitting: Physical Medicine and Rehabilitation

## 2023-05-10 VITALS — BP 133/72 | HR 55 | Ht 70.0 in | Wt 167.0 lb

## 2023-05-10 DIAGNOSIS — M21372 Foot drop, left foot: Secondary | ICD-10-CM | POA: Diagnosis not present

## 2023-05-10 DIAGNOSIS — M6281 Muscle weakness (generalized): Secondary | ICD-10-CM | POA: Insufficient documentation

## 2023-05-10 DIAGNOSIS — G8114 Spastic hemiplegia affecting left nondominant side: Secondary | ICD-10-CM | POA: Diagnosis present

## 2023-05-10 DIAGNOSIS — R269 Unspecified abnormalities of gait and mobility: Secondary | ICD-10-CM | POA: Diagnosis present

## 2023-05-10 DIAGNOSIS — M5416 Radiculopathy, lumbar region: Secondary | ICD-10-CM | POA: Insufficient documentation

## 2023-05-10 MED ORDER — BACLOFEN 5 MG PO TABS: 5.0000 mg | ORAL_TABLET | Freq: Three times a day (TID) | ORAL | 5 refills | Status: DC | PRN

## 2023-05-10 NOTE — Progress Notes (Addendum)
 Subjective:    Patient ID: Eddie Calhoun, male    DOB: [birth year], 73 y.o.   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 Also has lumbar radiculopathy which is improving with increased foot drop on L-  Overall things pretty good.   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   Spasticity- esp L knee- a little worse- called ot see me early- since really  bothersome.   When walks  (speed 16-17 minutes/miles at best), was feeling good, in summer, etc.  Not sure if cold is the problem- cold some effect, but also notices in home-  Esp going down the stairs vs up the stairs.  Feels the ratcheting- catching.is pretty frequent- like every step.    Got 2nd shot for back- did reduce amount of spasticity.   Called to see if could get another appt- has one for Tuesday next week for shot for back. Last shot was April/May of last year-   Hips out of kilter when walks, per wife.   Not back to having a lot of pain- no pain at all.   L hand is getting stiff- actually both hands, but L hand getting stiffer-    Just walking- not to gym or doing HEP    Wife needs back surgery- fusion in lumbar spine- Dr Debby? [facility]-     Pain Inventory Average Pain  None, Stroke follow up  Pain Right Now   None, Stroke follow up  My pain is   None, Stroke follow up   Family History  Problem Relation Age of Onset   Hypertension Mother    Colon cancer Father 4   Hypertension Father    Cancer Father 64       father died with hx of prostate cancer   Esophageal cancer Neg Hx    Rectal cancer Neg Hx    Stomach cancer Neg Hx    Social History   Socioeconomic History   Marital status: Married    Spouse name: Not on file   Number of children: Not on file   Years of education: Not on file    Highest education level: Associate degree: occupational, scientist, product/process development, or vocational program  Occupational History   Not on file  Tobacco Use   Smoking status: Former    Current packs/day: 0.00    Average packs/day: 2.0 packs/day for 20.0 years (40.0 ttl pk-yrs)    Types: Cigarettes    Start date: 09/21/1967    Quit date: 09/21/1987    Years since quitting: 35.6   Smokeless tobacco: Never  Vaping Use   Vaping status: Never Used  Substance and Sexual Activity   Alcohol use: Yes    Comment: social   Drug use: Never   Sexual activity: Not Currently  Other Topics Concern   Not on file  Social History Narrative   Not on file   Social Drivers of Health   Financial Resource Strain: Low Risk  (03/16/2023)   Overall Financial Resource Strain (CARDIA)    Difficulty of Paying Living Expenses: Not hard at all  Food Insecurity: No Food Insecurity (03/16/2023)   Hunger Vital Sign    Worried About Running Out of Food in the Last Year: Never true    Ran  Out of Food in the Last Year: Never true  Transportation Needs: No Transportation Needs (03/16/2023)   PRAPARE - Administrator, Civil Service (Medical): No    Lack of Transportation (Non-Medical): No  Physical Activity: Sufficiently Active (03/16/2023)   Exercise Vital Sign    Days of Exercise per Week: 6 days    Minutes of Exercise per Session: 30 min  Stress: No Stress Concern Present (03/16/2023)   [employer] of Occupational Health - Occupational Stress Questionnaire    Feeling of Stress : Not at all  Social Connections: Socially Integrated (03/16/2023)   Social Connection and Isolation Panel [NHANES]    Frequency of Communication with Friends and Family: Twice a week    Frequency of Social Gatherings with Friends and Family: More than three times a week    Attends Religious Services: More than 4 times per year    Active Member of Clubs or Organizations: Yes    Attends Engineer, Structural: More than 4 times  per year    Marital Status: Married   Past Surgical History:  Procedure Laterality Date   CARPAL TUNNEL RELEASE Bilateral 04/2021   Past Surgical History:  Procedure Laterality Date   CARPAL TUNNEL RELEASE Bilateral 04/2021   Past Medical History:  Diagnosis Date   Allergy    allergic rhinitis   BPH (benign prostatic hyperplasia)    Elevated prostate specific antigen (PSA)    Hypertension    Implantable loop recorder present 02/04/2022   Plantar fasciitis of right foot    Pneumonia    Thrombocytopenia, unspecified (HCC) 07/26/2012   03/24/12  129,000!   BP 133/72   Pulse (!) 55   Ht 5' 10 (1.778 m)   Wt 167 lb (75.8 kg)   SpO2 94%   BMI 23.96 kg/m   Opioid Risk Score:   Fall Risk Score:  `1  Depression screen PHQ 2/9     05/10/2023    1:21 PM 01/01/2023    1:07 PM 12/24/2022    9:13 AM 03/19/2022    2:35 PM 01/23/2022    8:35 AM 12/12/2021    8:29 AM 12/04/2021   10:28 AM  Depression screen PHQ 2/9  Decreased Interest 0 0 0 0 0 0 0  Down, Depressed, Hopeless 0 0 0 0 0 0 0  PHQ - 2 Score 0 0 0 0 0 0 0  Altered sleeping  0   0    Tired, decreased energy  0   0    Change in appetite  0   0    Feeling bad or failure about yourself   0   0    Trouble concentrating  0   0    Moving slowly or fidgety/restless  0   0    Suicidal thoughts  0   0    PHQ-9 Score  0   0    Difficult doing work/chores  Not difficult at all   Not difficult at all        Review of Systems An entire ROS was completed and found to be negative except for HPI    Objective:   Physical Exam  Awake, alert, appropriate, hard to change topics; unaccompanied today, no assistive device, NAD MSK: LUE 5/5 and RUE 5/5 B/L  RLE_ 5/5- HF maybe very slightly weak 5-/5 LLE- HF 4/5; KE/KF 5-/5; DF 4+ to 5-/5 and PF 4+/5  Fine motor movements decreased on L- like hard to reach-  arthritic? 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 Also has lumbar radiculopathy which is improving with increased foot drop on L-     Asking if OK to do injections q6 months for back- - I don't think we need to worry about this right now. Suggest doing q6 months as needed.    2.  Voltaren gel use up to 4x/day- - if needs it, use it 4x/day to keep in system better for a few days- and see how that helps- pea sized dollop- for hand pain- can also help for superficial joints overall.  Ex: neck, hands, elbows, shoulders, etc.    3. Try baclofen  5 mg 3x/day AS NEEDED- only lasts 8 hours- doesn't last longer than 8 hours.  If 5 mg tablet isn't covered by insurance- then will change to 10 mg tablet and you will break in half.   I have had 3 patients have allergic reaction to Baclofen - so first pill first hour, be with someone.    4.  Get back to do Home exercise program. 10-15 minutes/day-   Get back to these exercises from PT- can also go back to gym as well.   Exercises - Gastroc Stretch on Wall  - 1 x daily - 7 x weekly - 2 sets - 2 reps - 30 sec hold - Seated Ankle Dorsiflexion with Anchored Resistance  - 1 x daily - 7 x weekly - 3 sets - 10 reps - Eccentric Heel Lowering on Step  - 1 x daily - 7 x weekly - 3 sets - 10 reps - Forward Step Down  - 1 x daily - 7 x weekly - 3 sets - 10 reps - Squat Jumps  - 1 x daily - 7 x weekly - 3 sets - 10 reps - Kettlebell Deadlift  - 1 x daily - 7 x weekly - 3 sets - 10 reps - Single Leg Stance on Foam Pad  - 1 x daily - 5 x weekly - 3 sets - 10 reps -encouraged pt to use toe touch with RLE when LLE in stance for appropriate challenge w/o requiring UE support -  Standing with Feet Together on Foam Pad   - 1 x daily - 5 x weekly - 3 sets - 10 reps -added eyes closed for inc challenge same frequency - Romberg Stance Eyes Closed on Foam Pad  - 1 x daily - 5 x weekly - 3 sets - 10 reps -pt instructed to start with eyes open and use back of chair in front for added 3rd pt for balance as needed.   - Tandem Stance on Foam Pad with Eyes Closed  - 1 x daily - 5 x weekly - 3 sets - 10 reps *Pt encourage to vary length of hold time to match length of time he can hold position w/o LOB.  Demo'd appropriate corner setup at home for all standing balance tasks.   - Mountain Climber on Counter  - 1 x daily - 5 x weekly -  3 sets - 10 reps - Single Leg Bridge  - 1 x daily - 5 x weekly - 3 sets - 10 reps 5.   F/U in 3 months- to f/u on spasticity- call me in ~ 4 weeks to make sure no issues    I spent a total of  34  minutes on total care today- >50% coordination of care- due to d/w pt about spasticity- wait on botox for now- since mild- discussed/educate don HEP and stretching.

## 2023-05-10 NOTE — Patient Instructions (Signed)
 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 Also has lumbar radiculopathy which is improving with increased foot drop on L-     Asking if OK to do injections q6 months for back- - I don't think we need to worry about this right now. Suggest doing q6 months as needed.    2.  Voltaren gel use up to 4x/day- - if needs it, use it 4x/day to keep in system better for a few days- and see how that helps- pea sized dollop- for hand pain- can also help for superficial joints overall.  Ex: neck, hands, elbows, shoulders, etc.    3. Try baclofen  5 mg 3x/day AS NEEDED- only lasts 8 hours- doesn't last longer than 8 hours.  If 5 mg tablet isn't covered by insurance- then will change to 10 mg tablet and you will break in half.   I have had 3 patients have allergic reaction to Baclofen - so first pill first hour, be with someone.    4.  Get back to do Home exercise program. 10-15 minutes/day-   Get back to these exercises from PT- can also go back to gym as well.   Exercises - Gastroc Stretch on Wall  - 1 x daily - 7 x weekly - 2 sets - 2 reps - 30 sec hold - Seated Ankle Dorsiflexion with Anchored Resistance  - 1 x daily - 7 x weekly - 3 sets - 10 reps - Eccentric Heel Lowering on Step  - 1 x daily - 7 x weekly - 3 sets - 10 reps - Forward Step Down  - 1 x daily - 7 x weekly - 3 sets - 10 reps - Squat Jumps  - 1 x daily - 7 x weekly - 3 sets - 10 reps - Kettlebell Deadlift  - 1 x daily - 7 x weekly - 3 sets - 10 reps - Single Leg Stance on Foam Pad  - 1 x daily - 5 x weekly - 3 sets - 10 reps -encouraged pt to use toe touch with RLE when LLE in stance for appropriate challenge w/o requiring UE support - Standing with Feet Together on Foam Pad   - 1 x daily - 5 x weekly - 3 sets - 10 reps -added eyes closed for inc challenge same frequency -  Romberg Stance Eyes Closed on Foam Pad  - 1 x daily - 5 x weekly - 3 sets - 10 reps -pt instructed to start with eyes open and use back of chair in front for added 3rd pt for balance as needed.   - Tandem Stance on Foam Pad with Eyes Closed  - 1 x daily - 5 x weekly - 3 sets - 10 reps *Pt encourage to vary length of hold time to match length of time he can hold position w/o LOB.  Demo'd appropriate corner setup at home for all standing balance tasks.   - Mountain Climber on Counter  - 1 x daily - 5 x weekly - 3 sets - 10 reps - Single Leg Bridge  - 1 x daily - 5 x weekly - 3 sets - 10 reps 5.   F/U in 3 months- to f/u on spasticity- call me in ~ 4 weeks to make sure no issues

## 2023-05-16 ENCOUNTER — Encounter: Payer: Self-pay | Admitting: Physical Medicine and Rehabilitation

## 2023-06-10 ENCOUNTER — Ambulatory Visit (INDEPENDENT_AMBULATORY_CARE_PROVIDER_SITE_OTHER): Payer: PRIVATE HEALTH INSURANCE

## 2023-06-10 DIAGNOSIS — I639 Cerebral infarction, unspecified: Secondary | ICD-10-CM | POA: Diagnosis not present

## 2023-06-10 LAB — CUP PACEART REMOTE DEVICE CHECK
Date Time Interrogation Session: 20250202231425
Implantable Pulse Generator Implant Date: 20231005

## 2023-06-11 ENCOUNTER — Encounter: Payer: Self-pay | Admitting: Internal Medicine

## 2023-06-14 ENCOUNTER — Other Ambulatory Visit: Payer: Self-pay | Admitting: Internal Medicine

## 2023-06-14 DIAGNOSIS — E785 Hyperlipidemia, unspecified: Secondary | ICD-10-CM

## 2023-06-19 ENCOUNTER — Encounter: Payer: Self-pay | Admitting: Physical Medicine and Rehabilitation

## 2023-06-24 ENCOUNTER — Ambulatory Visit: Payer: Medicare Other | Admitting: Physical Medicine and Rehabilitation

## 2023-07-15 ENCOUNTER — Ambulatory Visit (INDEPENDENT_AMBULATORY_CARE_PROVIDER_SITE_OTHER): Payer: PRIVATE HEALTH INSURANCE

## 2023-07-15 DIAGNOSIS — I639 Cerebral infarction, unspecified: Secondary | ICD-10-CM

## 2023-07-15 LAB — CUP PACEART REMOTE DEVICE CHECK
Date Time Interrogation Session: 20250309231506
Implantable Pulse Generator Implant Date: 20231005

## 2023-07-16 ENCOUNTER — Other Ambulatory Visit: Payer: Self-pay | Admitting: Internal Medicine

## 2023-07-16 DIAGNOSIS — N401 Enlarged prostate with lower urinary tract symptoms: Secondary | ICD-10-CM

## 2023-07-17 ENCOUNTER — Encounter: Payer: Self-pay | Admitting: Internal Medicine

## 2023-07-18 NOTE — Progress Notes (Signed)
 [facility] Summary Report / Loop Recorder

## 2023-08-09 ENCOUNTER — Encounter
Payer: Medicare Other | Attending: Physical Medicine and Rehabilitation | Admitting: Physical Medicine and Rehabilitation

## 2023-08-09 ENCOUNTER — Encounter: Payer: Self-pay | Admitting: Physical Medicine and Rehabilitation

## 2023-08-09 VITALS — BP 128/76 | HR 78 | Ht 70.0 in | Wt 184.0 lb

## 2023-08-09 DIAGNOSIS — G8114 Spastic hemiplegia affecting left nondominant side: Secondary | ICD-10-CM | POA: Diagnosis present

## 2023-08-09 DIAGNOSIS — M21372 Foot drop, left foot: Secondary | ICD-10-CM | POA: Diagnosis present

## 2023-08-09 DIAGNOSIS — M5416 Radiculopathy, lumbar region: Secondary | ICD-10-CM

## 2023-08-09 NOTE — Progress Notes (Signed)
 Subjective:    Patient ID: Eddie Calhoun, male    DOB: 12-01-1950, 73 y.o.   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 with L mild L hemiparesis and spasticity . Also has lumbar radiculopathy which is improving with improving foot drop on L-    Been good-  Wife- had back surgery- has been nursing- at L4/5. For past 2 months.   Didn't need a RW.    Been doing yard work and been exercising- and used Systems analyst for 2 months  was raking leaves- has 1.5 acres-  has been walking a [employer] a Museum/gallery curator- 1st place-  usually gets 148 average. Has new bowling ball- 15 lbs ball-  as well and trying to do golf this year.  Does 9-10 games in a hour.   Not going on cruise like planned, but doing one for Xmas.  And next October to [place].   IS taking baclofen 2x/day- in AM and night- for the most part, it works- can still spasms if overdoes that day- mainly LLE.  Not like it was before.  Doesn't have twitching during day like used to- doesn't taking lunch time dose.  Not painful like it was initially.   Now L side is not a different temperature  like it used to- was really annoying-    Voltaren gel has tried for hands- hand pain is getting worse- stiff and sore.  Hands warmer now and getting warmer outside, so pain is better when it's so cold.   Feels also in B/L wrists, but nowhere else.  Just use Voltaren if pain bad- mainly in AM. Puts coating on and puts latex glove in place- L hand much worse than R hand so only treats L hand.   HEP- not really doing HEP, but is exercising other ways as detailed above.   Last had back injection- in December 2024-  No back pain right now- effect was good immediately.  And still working a little.   Not having the same types of pain/problems.   Thinks has worn off, but not as bad .       Pain Inventory Average Pain 0 Pain Right Now 0 My pain is intermittent and aching mostly from arthritis   LOCATION OF PAIN  No pain- weakness and spasticity on left side: arm, hand, leg  BOWEL Number of stools per week: 7 Oral laxative use No   BLADDER Normal Difficulty starting stream  taking Flomax     Mobility ability to climb steps?  yes do you drive?  yes Do you have any goals in this area?  yes  Function retired Do you have any goals in this area?  yes  Neuro/Psych weakness numbness  Prior Studies Any changes since last visit?  yes x-rays Dental  Physicians involved in your care Any changes since last visit?  no   Family History  Problem Relation Age of Onset   Hypertension Mother    Colon cancer Father 83   Hypertension Father    Cancer Father 58       father died with hx of prostate cancer   Esophageal cancer Neg Hx    Rectal cancer Neg Hx    Stomach cancer Neg Hx    Social History   Socioeconomic  History   Marital status: Married    Spouse name: Not on file   Number of children: Not on file   Years of education: Not on file   Highest education level: Associate degree: occupational, Scientist, product/process development, or vocational program  Occupational History   Not on file  Tobacco Use   Smoking status: Former    Current packs/day: 0.00    Average packs/day: 2.0 packs/day for 20.0 years (40.0 ttl pk-yrs)    Types: Cigarettes    Start date: 09/21/1967    Quit date: 09/21/1987    Years since quitting: 35.9   Smokeless tobacco: Never  Vaping Use   Vaping status: Never Used  Substance and Sexual Activity   Alcohol use: Yes    Comment: social   Drug use: Never   Sexual activity: Not Currently  Other Topics Concern   Not on file  Social History Narrative   Not on file   Social Drivers of Health   Financial Resource Strain: Low Risk  (03/16/2023)   Overall Financial Resource Strain (CARDIA)    Difficulty  of Paying Living Expenses: Not hard at all  Food Insecurity: No Food Insecurity (03/16/2023)   Hunger Vital Sign    Worried About Running Out of Food in the Last Year: Never true    Ran Out of Food in the Last Year: Never true  Transportation Needs: No Transportation Needs (03/16/2023)   PRAPARE - Administrator, Civil Service (Medical): No    Lack of Transportation (Non-Medical): No  Physical Activity: Sufficiently Active (03/16/2023)   Exercise Vital Sign    Days of Exercise per Week: 6 days    Minutes of Exercise per Session: 30 min  Stress: No Stress Concern Present (03/16/2023)   [employer] of Occupational Health - Occupational Stress Questionnaire    Feeling of Stress : Not at all  Social Connections: Socially Integrated (03/16/2023)   Social Connection and Isolation Panel [NHANES]    Frequency of Communication with Friends and Family: Twice a week    Frequency of Social Gatherings with Friends and Family: More than three times a week    Attends Religious Services: More than 4 times per year    Active Member of Clubs or Organizations: Yes    Attends Engineer, structural: More than 4 times per year    Marital Status: Married   Past Surgical History:  Procedure Laterality Date   CARPAL TUNNEL RELEASE Bilateral 04/2021   Past Medical History:  Diagnosis Date   Allergy    allergic rhinitis   BPH (benign prostatic hyperplasia)    Elevated prostate specific antigen (PSA)    Hypertension    Implantable loop recorder present 02/04/2022   Plantar fasciitis of right foot    Pneumonia    Thrombocytopenia, unspecified (HCC) 07/26/2012   03/24/12  129,000!   Ht 5\' 10"  (1.778 m)   Wt 184 lb (83.5 kg)   BMI 26.40 kg/m   Opioid Risk Score:   Fall Risk Score:  `1  Depression screen [facility] 2/9     08/09/2023    8:46 AM 05/10/2023    1:21 PM 01/01/2023    1:07 PM 12/24/2022    9:13 AM 03/19/2022    2:35 PM 01/23/2022    8:35 AM 12/12/2021    8:29 AM   Depression screen PHQ 2/9  Decreased Interest 0 0 0 0 0 0 0  Down, Depressed, Hopeless 0 0 0 0 0 0 0  PHQ -  2 Score 0 0 0 0 0 0 0  Altered sleeping   0   0   Tired, decreased energy   0   0   Change in appetite   0   0   Feeling bad or failure about yourself    0   0   Trouble concentrating   0   0   Moving slowly or fidgety/restless   0   0   Suicidal thoughts   0   0   PHQ-9 Score   0   0   Difficult doing work/chores   Not difficult at all   Not difficult at all     Review of Systems  Neurological:  Positive for weakness and numbness.  All other systems reviewed and are negative.      Objective:   Physical Exam  Awake,alert, appropriate, wringing hands intermittently; NAD MSK: B/L Ue's 5/5- except 5-/5 in L FA RLE 5/5 in HF, KE, KF, DF and PF LLE- HF 4+/5; KE/KF 5-/5; DF 4+ to 5-/5 and PF 4+/5   Gait:  Mild circumduction   On LLE/L foot- with mild foot slap noted.  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 Also has lumbar radiculopathy which is improving with improving  foot drop on L-  Can Can take 10 mg Baclofen when overdoes it- so 5 mg in Am and 10 mg at night only on those days. But sounds like much better than it was- so continue baclofen- doesn't need refills.    2. Wants to wait to get Epidural steroid injection until pain/weakness real bad- doesn't want to schedule every 6 months- which I reasonable.     3. Discussed if Symptoms worse in October, try to schedule before cruise.    4.  Con't Home exercise programs/exercise daily- will help back and  keep you strong!   5.  F/U q3 months- f/u on L hemiparesis and spasticity as wel as L lumbar radiculopathy    I spent a total of 31   minutes on total care today- >50% coordination of care- due to d/w pt about his spasticity and L lumbar radiculopathy-  and how to relates to his life schedule- encouraged to do exercise daily.

## 2023-08-09 NOTE — Patient Instructions (Signed)
 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 Also has lumbar radiculopathy which is improving with improving  foot drop on L-  Can Can take 10 mg Baclofen when overdoes it- so 5 mg in Am and 10 mg at night only on those days. But sounds like much better than it was- so continue baclofen- doesn't need refills.    2. Wants to wait to get Epidural steroid injection until pain/weakness real bad- doesn't want to schedule every 6 months- which I reasonable.     3. Discussed if Symptoms worse in October, try to schedule before cruise.    4.  Con't Home exercise programs/exercise daily- will help back and keep you strong!   5.  F/U q3 months- f/u on L hemiparesis and spasticity as wel as L lumbar radiculopathy

## 2023-08-19 ENCOUNTER — Ambulatory Visit (INDEPENDENT_AMBULATORY_CARE_PROVIDER_SITE_OTHER): Payer: PRIVATE HEALTH INSURANCE

## 2023-08-19 DIAGNOSIS — I639 Cerebral infarction, unspecified: Secondary | ICD-10-CM | POA: Diagnosis not present

## 2023-08-19 LAB — CUP PACEART REMOTE DEVICE CHECK
Date Time Interrogation Session: 20250413231548
Implantable Pulse Generator Implant Date: 20231005

## 2023-08-20 ENCOUNTER — Encounter: Payer: Self-pay | Admitting: Internal Medicine

## 2023-08-26 ENCOUNTER — Ambulatory Visit
Admission: RE | Admit: 2023-08-26 | Discharge: 2023-08-26 | Disposition: A | Discharge status: Home / Self Care | Source: Ambulatory Visit | Attending: Family Medicine | Admitting: Family Medicine

## 2023-08-26 VITALS — BP 122/80 | HR 73 | Temp 98.9°F | Resp 20

## 2023-08-26 DIAGNOSIS — J320 Chronic maxillary sinusitis: Secondary | ICD-10-CM

## 2023-08-26 DIAGNOSIS — H65191 Other acute nonsuppurative otitis media, right ear: Secondary | ICD-10-CM | POA: Diagnosis not present

## 2023-08-26 MED ORDER — PREDNISONE 20 MG PO TABS: 20.0000 mg | ORAL_TABLET | Freq: Every day | ORAL | 0 refills | Status: DC

## 2023-08-26 MED ORDER — CETIRIZINE HCL 10 MG PO TABS: 10.0000 mg | ORAL_TABLET | Freq: Every day | ORAL | 0 refills | Status: DC

## 2023-08-26 MED ORDER — AMOXICILLIN 875 MG PO TABS: 875.0000 mg | ORAL_TABLET | Freq: Two times a day (BID) | ORAL | 0 refills | Status: DC

## 2023-08-26 NOTE — ED Triage Notes (Addendum)
 Pt states he felt he was having allergies x 5 days-cough started 3 days ago-some relief with mucinex dm, sudafed, claritin and tylenol - had neg covid home test-NAD-steady gait

## 2023-08-26 NOTE — ED Provider Notes (Signed)
 [name]  Note:  This document was prepared using Conservation officer, historic buildings and may include unintentional dictation errors.  MRN: [id number] DOB: Sep 29, 1950  Subjective:   Eddie Calhoun is a 73 y.o. male presenting for 7-day history of persistent malaise, fatigue, fever, congestion, drainage, coughing.  Symptoms have been getting worse for the past 5 days.  In the past 2 days has had significant difficulty sleeping due to right sided facial pain, internal right ear pain that radiates to his throat.  No history of asthma.  Quit smoking in 1989.  Has a remote history of severe pneumonia, legionnaires disease.  No current facility-administered medications for this encounter.  Current Outpatient Medications:    acetaminophen  (TYLENOL ) 325 MG tablet, Take 2 tablets (650 mg total) by mouth every 4 (four) hours as needed for mild pain (or temp > 37.5 C (99.5 F))., Disp: 30 tablet, Rfl: 0   aspirin  EC 81 MG tablet, 1 tablet, Disp: , Rfl:    baclofen  5 MG TABS, Take 1 tablet (5 mg total) by mouth 3 (three) times daily as needed for muscle spasms. For spasticity, Disp: 90 tablet, Rfl: 5   cholecalciferol (VITAMIN D3) 25 MCG (1000 UNIT) tablet, Take 1 tablet (1,000 Units total) by mouth daily., Disp: 30 tablet, Rfl: 0   Multiple Vitamin (MULTIVITAMIN) tablet, Take 1 tablet by mouth daily.   (Patient not taking: Reported on 08/09/2023), Disp: , Rfl:    olmesartan  (BENICAR ) 20 MG tablet, Take 0.5 tablets (10 mg total) by mouth daily., Disp: 45 tablet, Rfl: 1   rosuvastatin  (CRESTOR ) 20 MG tablet, TAKE 1 TABLET BY MOUTH DAILY, Disp: 90 tablet, Rfl: 1   tamsulosin  (FLOMAX ) 0.4 MG CAPS capsule, TAKE ONE CAPSULE BY MOUTH ONCE DAILY AFTER EVENING MEAL, Disp: 90 capsule, Rfl: 0   vitamin B-12 (CYANOCOBALAMIN ) 1000 MCG tablet, Take 1 tablet (1,000 mcg total) by mouth daily., Disp: 30 tablet, Rfl: 0   Allergies  Allergen Reactions   Contrast Media [Iodinated Contrast Media]  Hives   Dutasteride Other (See Comments)    Other reaction(s): itching Other reaction(s): itching    Past Medical History:  Diagnosis Date   Allergy    allergic rhinitis   BPH (benign prostatic hyperplasia)    Elevated prostate specific antigen (PSA)    Hypertension    Implantable loop recorder present 02/04/2022   Plantar fasciitis of right foot    Pneumonia    Thrombocytopenia, unspecified (HCC) 07/26/2012   03/24/12  129,000!     Past Surgical History:  Procedure Laterality Date   CARPAL TUNNEL RELEASE Bilateral 04/2021    Family History  Problem Relation Age of Onset   Hypertension Mother    Colon cancer Father 60   Hypertension Father    Cancer Father 32       father died with hx of prostate cancer   Esophageal cancer Neg Hx    Rectal cancer Neg Hx    Stomach cancer Neg Hx     Social History   Tobacco Use   Smoking status: Former    Current packs/day: 0.00    Average packs/day: 2.0 packs/day for 20.0 years (40.0 ttl pk-yrs)    Types: Cigarettes    Start date: 09/21/1967    Quit date: 09/21/1987    Years since quitting: 35.9   Smokeless tobacco: Never  Vaping Use   Vaping status: Never Used  Substance Use Topics   Alcohol use: Yes    Comment: social  Drug use: Never    ROS   Objective:   Vitals: BP 122/80 (BP Location: Left Arm)   Pulse 73   Temp 98.9 F (37.2 C) (Oral)   Resp 20   SpO2 95%   Physical Exam Constitutional:      General: He is not in acute distress.    Appearance: Normal appearance. He is well-developed and normal weight. He is not ill-appearing, toxic-appearing or diaphoretic.  HENT:     Head: Normocephalic and atraumatic.     Right Ear: Ear canal and external ear normal. No drainage, swelling or tenderness. No middle ear effusion. There is no impacted cerumen. Tympanic membrane is erythematous and bulging.     Left Ear: Tympanic membrane, ear canal and external ear normal. No drainage, swelling or tenderness.  No middle  ear effusion. There is no impacted cerumen. Tympanic membrane is not erythematous or bulging.     Nose: No congestion or rhinorrhea.     Right Sinus: Maxillary sinus tenderness present.     Left Sinus: No maxillary sinus tenderness.     Mouth/Throat:     Mouth: Mucous membranes are moist.     Pharynx: No pharyngeal swelling, oropharyngeal exudate, posterior oropharyngeal erythema or uvula swelling.     Tonsils: No tonsillar exudate or tonsillar abscesses. 0 on the right. 0 on the left.  Eyes:     General: No scleral icterus.       Right eye: No discharge.        Left eye: No discharge.     Extraocular Movements: Extraocular movements intact.     Conjunctiva/sclera: Conjunctivae normal.  Cardiovascular:     Rate and Rhythm: Normal rate and regular rhythm.     Heart sounds: Normal heart sounds. No murmur heard.    No friction rub. No gallop.  Pulmonary:     Effort: Pulmonary effort is normal. No respiratory distress.     Breath sounds: Normal breath sounds. No stridor. No wheezing, rhonchi or rales.  Musculoskeletal:     Cervical back: Normal range of motion and neck supple. No rigidity. No muscular tenderness.  Neurological:     General: No focal deficit present.     Mental Status: He is alert and oriented to person, place, and time.  Psychiatric:        Mood and Affect: Mood normal.        Behavior: Behavior normal.        Thought Content: Thought content normal.        Judgment: Judgment normal.     Assessment and Plan :   PDMP not reviewed this encounter.  1. Other non-recurrent acute nonsuppurative otitis media of right ear   2. Right maxillary sinusitis    Deferred imaging given clear cardiopulmonary exam, hemodynamically stable vital signs.  Recommend managing for right-sided maxillary sinusitis with complication of right otitis media.  Start amoxicillin , use supportive care.  Counseled patient on potential for adverse effects with medications prescribed/recommended  today, ER and return-to-clinic precautions discussed, patient verbalized understanding.    Adolph Hoop, PA-C 08/26/23 1344

## 2023-08-26 NOTE — Discharge Instructions (Signed)
 We will manage this as a sinus infection of the right maxillary sinus complicating to a right ear infection. For sore throat or cough try using a honey-based tea. Use 3 teaspoons of honey with juice squeezed from half lemon. Place shaved pieces of ginger into 1/2-1 cup of water and warm over stove top. Then mix the ingredients and repeat every 4 hours as needed. Please take Tylenol  500mg -650mg  every 6 hours for throat pain, fevers, aches and pains. Hydrate very well with at least 2 liters of water. Eat light meals such as soups (chicken and noodles, vegetable, chicken and wild rice).  Do not eat foods that you are allergic to.  Taking an antihistamine like Zyrtec  can help against postnasal drainage, sinus congestion which can cause sinus pain, sinus headaches, throat pain, painful swallowing, coughing.  You can take this together with cough medication as needed over-the-counter.

## 2023-09-02 NOTE — Progress Notes (Signed)
 [facility] Summary Report / Loop Recorder

## 2023-09-07 ENCOUNTER — Ambulatory Visit
Admission: EM | Admit: 2023-09-07 | Discharge: 2023-09-07 | Disposition: A | Discharge status: Home / Self Care | Attending: Family Medicine | Admitting: Family Medicine

## 2023-09-07 ENCOUNTER — Other Ambulatory Visit: Payer: Self-pay

## 2023-09-07 DIAGNOSIS — H6991 Unspecified Eustachian tube disorder, right ear: Secondary | ICD-10-CM

## 2023-09-07 MED ORDER — PREDNISONE 20 MG PO TABS: 20.0000 mg | ORAL_TABLET | Freq: Every day | ORAL | 0 refills | Status: AC

## 2023-09-07 MED ORDER — FLUTICASONE PROPIONATE 50 MCG/ACT NA SUSP: 1.0000 | Freq: Every day | NASAL | 0 refills | Status: DC

## 2023-09-07 NOTE — ED Triage Notes (Signed)
 Pt c/o ear fullness bilat still since last time. PT states took sudafed a few days and it helped clear the ears, but it stopped working. Pt c/o dry cough at night while laying down.

## 2023-09-07 NOTE — Discharge Instructions (Signed)
 Start Flonase daily.  Start prednisone  daily for 3 days.  Continue your allergy medicine daily.  Follow-up with your PCP or schedule appointment next week and/or you may follow-up with ear nose and throat if symptoms do not improve.  Please go to the ER for any worsening symptoms.  Hope you feel better soon!

## 2023-09-07 NOTE — ED Provider Notes (Signed)
 [facility]    CSN: [id number] Arrival date & time: 09/07/23  1034      History   Chief Complaint No chief complaint on file.   HPI Eddie Calhoun is a 73 y.o. male presents for clogged ears.  Patient presents for ear pain.  Patient was seen in urgent care on 4/21 and treated with amoxicillin , cetirizine , prednisone  for right OM.  Reports significant improvement in symptoms but still has some clogged sensation primarily in the right ear.  No fevers, drainage.  Has some mild congestion and postnasal drip but overall feels better.  He is been taking loratadine beside the cetirizine  which he does state helps.  No other concerns at this time.  HPI  Past Medical History:  Diagnosis Date   Allergy    allergic rhinitis   BPH (benign prostatic hyperplasia)    Elevated prostate specific antigen (PSA)    Hypertension    Implantable loop recorder present 02/04/2022   Plantar fasciitis of right foot    Pneumonia    Thrombocytopenia, unspecified (HCC) 07/26/2012   03/24/12  129,000!    Patient Active Problem List   Diagnosis Date Noted   Lumbar radiculopathy 05/10/2023   Left spastic hemiparesis (HCC) 06/22/2022   Abnormal gait due to muscle weakness 06/22/2022   Plantar fasciitis of right foot 06/22/2022   Cryptogenic stroke (HCC) 02/08/2022   Benign prostatic hyperplasia with weak urinary stream 12/12/2021   Screen for colon cancer 12/12/2021   Dyslipidemia, goal LDL below 70 12/12/2021   Primary hypertension 12/12/2021   Iron deficiency anemia secondary to inadequate dietary iron intake 12/12/2021   Left foot drop 08/23/2021   Lacunar infarction (HCC) 07/04/2021   Thrombocytopenia, unspecified (HCC) 07/26/2012    Past Surgical History:  Procedure Laterality Date   CARPAL TUNNEL RELEASE Bilateral 04/2021       Home Medications    Prior to Admission medications   Medication Sig Start Date End Date Taking? Authorizing Provider  fluticasone (FLONASE) 50  MCG/ACT nasal spray Place 1 spray into both nostrils daily. 09/07/23  Yes Keyante Durio, Jodi R, NP  predniSONE  (DELTASONE ) 20 MG tablet Take 1 tablet (20 mg total) by mouth daily with breakfast for 3 days. 09/07/23 09/10/23 Yes Nakeyia Menden, Jodi R, NP  acetaminophen  (TYLENOL ) 325 MG tablet Take 2 tablets (650 mg total) by mouth every 4 (four) hours as needed for mild pain (or temp > 37.5 C (99.5 F)). 07/04/21   de Thayne Fine, Cortney E, NP  aspirin  EC 81 MG tablet 1 tablet 07/17/21   [provider]  baclofen  5 MG TABS Take 1 tablet (5 mg total) by mouth 3 (three) times daily as needed for muscle spasms. For spasticity 05/10/23   Lovorn, Megan, MD  cetirizine  (ZYRTEC  ALLERGY) 10 MG tablet Take 1 tablet (10 mg total) by mouth daily. 08/26/23   Adolph Hoop, PA-C  cholecalciferol (VITAMIN D3) 25 MCG (1000 UNIT) tablet Take 1 tablet (1,000 Units total) by mouth daily. 07/17/21   Angiulli, Daniel J, PA-C  Multiple Vitamin (MULTIVITAMIN) tablet Take 1 tablet by mouth daily.   Patient not taking: Reported on 08/09/2023    [provider]  olmesartan  (BENICAR ) 20 MG tablet Take 0.5 tablets (10 mg total) by mouth daily. 03/21/23   Arcadio Knuckles, MD  rosuvastatin  (CRESTOR ) 20 MG tablet TAKE 1 TABLET BY MOUTH DAILY 06/14/23   Arcadio Knuckles, MD  tamsulosin  (FLOMAX ) 0.4 MG CAPS capsule TAKE ONE CAPSULE BY MOUTH ONCE DAILY AFTER EVENING  MEAL 07/16/23   Arcadio Knuckles, MD  vitamin B-12 (CYANOCOBALAMIN ) 1000 MCG tablet Take 1 tablet (1,000 mcg total) by mouth daily. 07/17/21   Angiulli, Everlyn Hockey, PA-C    Family History Family History  Problem Relation Age of Onset   Hypertension Mother    Colon cancer Father 32   Hypertension Father    Cancer Father 53       father died with hx of prostate cancer   Esophageal cancer Neg Hx    Rectal cancer Neg Hx    Stomach cancer Neg Hx     Social History Social History   Tobacco Use   Smoking status: Former    Current packs/day: 0.00    Average packs/day: 2.0 packs/day for  20.0 years (40.0 ttl pk-yrs)    Types: Cigarettes    Start date: 09/21/1967    Quit date: 09/21/1987    Years since quitting: 35.9   Smokeless tobacco: Never  Vaping Use   Vaping status: Never Used  Substance Use Topics   Alcohol use: Yes    Comment: social   Drug use: Never     Allergies   Contrast media [iodinated contrast media] and Dutasteride   Review of Systems Review of Systems  HENT:         Ear clogged/congestion     Physical Exam Triage Vital Signs ED Triage Vitals  Encounter Vitals Group     BP 09/07/23 1135 118/74     Systolic BP Percentile --      Diastolic BP Percentile --      Pulse Rate 09/07/23 1135 62     Resp 09/07/23 1135 18     Temp 09/07/23 1135 97.9 F (36.6 C)     Temp Source 09/07/23 1135 Oral     SpO2 09/07/23 1135 97 %     Weight --      Height --      Head Circumference --      Peak Flow --      Pain Score 09/07/23 1133 0     Pain Loc --      Pain Education --      Exclude from Growth Chart --    No data found.  Updated Vital Signs BP 118/74   Pulse 62   Temp 97.9 F (36.6 C) (Oral)   Resp 18   SpO2 97%   Visual Acuity Right Eye Distance:   Left Eye Distance:   Bilateral Distance:    Right Eye Near:   Left Eye Near:    Bilateral Near:     Physical Exam Vitals and nursing note reviewed.  Constitutional:      General: He is not in acute distress.    Appearance: Normal appearance. He is not ill-appearing.  HENT:     Head: Normocephalic and atraumatic.     Right Ear: A middle ear effusion is present. Tympanic membrane is not erythematous.     Left Ear: Tympanic membrane and ear canal normal.  Eyes:     Pupils: Pupils are equal, round, and reactive to light.  Cardiovascular:     Rate and Rhythm: Normal rate.  Pulmonary:     Effort: Pulmonary effort is normal.  Skin:    General: Skin is warm and dry.  Neurological:     General: No focal deficit present.     Mental Status: He is alert and oriented to person,  place, and time.  Psychiatric:  Mood and Affect: Mood normal.        Behavior: Behavior normal.      UC Treatments / Results  Labs (all labs ordered are listed, but only abnormal results are displayed) Labs Reviewed - No data to display  EKG   Radiology No results found.  Procedures Procedures (including critical care time)  Medications Ordered in UC Medications - No data to display  Initial Impression / Assessment and Plan / UC Course  I have reviewed the triage vital signs and the nursing notes.  Pertinent labs & imaging results that were available during my care of the patient were reviewed by me and considered in my medical decision making (see chart for details).     Reviewed exam and symptoms with patient.  No red flags.  Discussed eustachian tube dysfunction.  Continue loratadine and will add on Flonase in 3 additional days of prednisone .  Patient states he has a PCP follow-up next week and he can follow-up then and/or he can with ear nose and throat, contact information provided.  ER precautions reviewed and patient verbalized understanding. Final Clinical Impressions(s) / UC Diagnoses   Final diagnoses:  Eustachian tube dysfunction, right     Discharge Instructions      Start Flonase daily.  Start prednisone  daily for 3 days.  Continue your allergy medicine daily.  Follow-up with your PCP or schedule appointment next week and/or you may follow-up with ear nose and throat if symptoms do not improve.  Please go to the ER for any worsening symptoms.  Hope you feel better soon!    ED Prescriptions     Medication Sig Dispense Auth. Provider   fluticasone (FLONASE) 50 MCG/ACT nasal spray Place 1 spray into both nostrils daily. 15.8 mL Oree Hislop, Jodi R, NP   predniSONE  (DELTASONE ) 20 MG tablet Take 1 tablet (20 mg total) by mouth daily with breakfast for 3 days. 3 tablet Alechia Lezama, Jodi R, NP      PDMP not reviewed this encounter.   Alleen Arbour,  NP 09/07/23 1154

## 2023-09-08 ENCOUNTER — Ambulatory Visit

## 2023-09-11 ENCOUNTER — Ambulatory Visit (INDEPENDENT_AMBULATORY_CARE_PROVIDER_SITE_OTHER): Admitting: Podiatry

## 2023-09-11 DIAGNOSIS — L600 Ingrowing nail: Secondary | ICD-10-CM | POA: Diagnosis not present

## 2023-09-11 NOTE — Progress Notes (Signed)
 Subjective:  Patient ID: Eddie Calhoun, male    DOB: [birth year],  MRN: [id number]  Chief Complaint  Patient presents with   Toe Pain    Pain at the tip of great toe     73 y.o. male presents with the above complaint.  Patient presents for right hallux lateral border ingrown painful to touch is progressive and worse worse with ambulation or shoe pressure would like to have removed has not seen anyone else prior to seeing me pain scale 7 out of 10 dull aching nature   Review of Systems: Negative except as noted in the HPI. Denies N/V/F/Ch.  Past Medical History:  Diagnosis Date   Allergy    allergic rhinitis   BPH (benign prostatic hyperplasia)    Elevated prostate specific antigen (PSA)    Hypertension    Implantable loop recorder present 02/04/2022   Plantar fasciitis of right foot    Pneumonia    Thrombocytopenia, unspecified (HCC) 07/26/2012   03/24/12  129,000!    Current Outpatient Medications:    acetaminophen  (TYLENOL ) 325 MG tablet, Take 2 tablets (650 mg total) by mouth every 4 (four) hours as needed for mild pain (or temp > 37.5 C (99.5 F))., Disp: 30 tablet, Rfl: 0   aspirin  EC 81 MG tablet, 1 tablet, Disp: , Rfl:    baclofen  5 MG TABS, Take 1 tablet (5 mg total) by mouth 3 (three) times daily as needed for muscle spasms. For spasticity, Disp: 90 tablet, Rfl: 5   cetirizine  (ZYRTEC  ALLERGY) 10 MG tablet, Take 1 tablet (10 mg total) by mouth daily., Disp: 30 tablet, Rfl: 0   cholecalciferol (VITAMIN D3) 25 MCG (1000 UNIT) tablet, Take 1 tablet (1,000 Units total) by mouth daily., Disp: 30 tablet, Rfl: 0   fluticasone (FLONASE) 50 MCG/ACT nasal spray, Place 1 spray into both nostrils daily., Disp: 15.8 mL, Rfl: 0   Multiple Vitamin (MULTIVITAMIN) tablet, Take 1 tablet by mouth daily.   (Patient not taking: Reported on 08/09/2023), Disp: , Rfl:    olmesartan  (BENICAR ) 20 MG tablet, Take 0.5 tablets (10 mg total) by mouth daily., Disp: 45 tablet, Rfl: 1   rosuvastatin   (CRESTOR ) 20 MG tablet, TAKE 1 TABLET BY MOUTH DAILY, Disp: 90 tablet, Rfl: 1   tamsulosin  (FLOMAX ) 0.4 MG CAPS capsule, TAKE ONE CAPSULE BY MOUTH ONCE DAILY AFTER EVENING MEAL, Disp: 90 capsule, Rfl: 0   vitamin B-12 (CYANOCOBALAMIN ) 1000 MCG tablet, Take 1 tablet (1,000 mcg total) by mouth daily., Disp: 30 tablet, Rfl: 0  Social History   Tobacco Use  Smoking Status Former   Current packs/day: 0.00   Average packs/day: 2.0 packs/day for 20.0 years (40.0 ttl pk-yrs)   Types: Cigarettes   Start date: 09/21/1967   Quit date: 09/21/1987   Years since quitting: 35.9  Smokeless Tobacco Never    Allergies  Allergen Reactions   Contrast Media [Iodinated Contrast Media] Hives   Dutasteride Other (See Comments)    Other reaction(s): itching Other reaction(s): itching   Objective:  There were no vitals filed for this visit. There is no height or weight on file to calculate BMI. Constitutional Well developed. Well nourished.  Vascular Dorsalis pedis pulses palpable bilaterally. Posterior tibial pulses palpable bilaterally. Capillary refill normal to all digits.  No cyanosis or clubbing noted. Pedal hair growth normal.  Neurologic Normal speech. Oriented to person, place, and time. Epicritic sensation to light touch grossly present bilaterally.  Dermatologic Painful ingrowing nail at lateral nail borders of the  hallux nail right. No other open wounds. No skin lesions.  Orthopedic: Normal joint ROM without pain or crepitus bilaterally. No visible deformities. No bony tenderness.   Radiographs: None Assessment:  No diagnosis found. Plan:  Patient was evaluated and treated and all questions answered.  Ingrown Nail, right -Patient elects to proceed with minor surgery to remove ingrown toenail removal today. Consent reviewed and signed by patient. -Ingrown nail excised. See procedure note. -Educated on post-procedure care including soaking. Written instructions provided and  reviewed. -Patient to follow up in 2 weeks for nail check.  Procedure: Excision of Ingrown Toenail Location: Right 1st toe lateral nail borders. Anesthesia: Lidocaine 1% plain; 1.5 mL and Marcaine 0.5% plain; 1.5 mL, digital block. Skin Prep: Betadine. Dressing: Silvadene; telfa; dry, sterile, compression dressing. Technique: Following skin prep, the toe was exsanguinated and a tourniquet was secured at the base of the toe. The affected nail border was freed, split with a nail splitter, and excised. Chemical matrixectomy was then performed with phenol and irrigated out with alcohol. The tourniquet was then removed and sterile dressing applied. Disposition: Patient tolerated procedure well. Patient to return in 2 weeks for follow-up.   No follow-ups on file.

## 2023-09-12 ENCOUNTER — Other Ambulatory Visit: Payer: Self-pay | Admitting: Internal Medicine

## 2023-09-12 DIAGNOSIS — I10 Essential (primary) hypertension: Secondary | ICD-10-CM

## 2023-09-17 ENCOUNTER — Other Ambulatory Visit: Payer: Self-pay

## 2023-09-19 ENCOUNTER — Ambulatory Visit: Payer: Medicare Other | Admitting: Internal Medicine

## 2023-09-19 ENCOUNTER — Encounter: Payer: Self-pay | Admitting: Internal Medicine

## 2023-09-19 ENCOUNTER — Ambulatory Visit: Payer: Self-pay | Admitting: Internal Medicine

## 2023-09-19 VITALS — BP 132/78 | HR 56 | Temp 98.0°F | Resp 16 | Ht 70.0 in | Wt 183.0 lb

## 2023-09-19 DIAGNOSIS — N1831 Chronic kidney disease, stage 3a: Secondary | ICD-10-CM | POA: Insufficient documentation

## 2023-09-19 DIAGNOSIS — E559 Vitamin D deficiency, unspecified: Secondary | ICD-10-CM | POA: Insufficient documentation

## 2023-09-19 DIAGNOSIS — I10 Essential (primary) hypertension: Secondary | ICD-10-CM

## 2023-09-19 DIAGNOSIS — E538 Deficiency of other specified B group vitamins: Secondary | ICD-10-CM | POA: Diagnosis not present

## 2023-09-19 DIAGNOSIS — E785 Hyperlipidemia, unspecified: Secondary | ICD-10-CM

## 2023-09-19 DIAGNOSIS — N529 Male erectile dysfunction, unspecified: Secondary | ICD-10-CM | POA: Insufficient documentation

## 2023-09-19 DIAGNOSIS — J309 Allergic rhinitis, unspecified: Secondary | ICD-10-CM | POA: Insufficient documentation

## 2023-09-19 DIAGNOSIS — Z8042 Family history of malignant neoplasm of prostate: Secondary | ICD-10-CM | POA: Insufficient documentation

## 2023-09-19 DIAGNOSIS — R3912 Poor urinary stream: Secondary | ICD-10-CM

## 2023-09-19 DIAGNOSIS — Z800 Family history of malignant neoplasm of digestive organs: Secondary | ICD-10-CM | POA: Insufficient documentation

## 2023-09-19 DIAGNOSIS — D696 Thrombocytopenia, unspecified: Secondary | ICD-10-CM | POA: Diagnosis not present

## 2023-09-19 DIAGNOSIS — N401 Enlarged prostate with lower urinary tract symptoms: Secondary | ICD-10-CM

## 2023-09-19 DIAGNOSIS — R202 Paresthesia of skin: Secondary | ICD-10-CM | POA: Insufficient documentation

## 2023-09-19 DIAGNOSIS — E7800 Pure hypercholesterolemia, unspecified: Secondary | ICD-10-CM | POA: Insufficient documentation

## 2023-09-19 LAB — LIPID PANEL
Cholesterol: 138 mg/dL (ref 0–200)
HDL: 53.7 mg/dL (ref >39.00)
LDL Cholesterol: 49 mg/dL (ref 0–99)
NonHDL: 84.62
Total CHOL/HDL Ratio: 3
Triglycerides: 180 mg/dL — ABNORMAL HIGH (ref 0.0–149.0)
VLDL: 36 mg/dL (ref 0.0–40.0)

## 2023-09-19 LAB — CBC WITH DIFFERENTIAL/PLATELET
Basophils Absolute: 0 10*3/uL (ref 0.0–0.1)
Basophils Relative: 0.4 % (ref 0.0–3.0)
Eosinophils Absolute: 0.2 10*3/uL (ref 0.0–0.7)
Eosinophils Relative: 5.9 % — ABNORMAL HIGH (ref 0.0–5.0)
HCT: 40 % (ref 39.0–52.0)
Hemoglobin: 13.6 g/dL (ref 13.0–17.0)
Lymphocytes Relative: 21 % (ref 12.0–46.0)
Lymphs Abs: 0.8 10*3/uL (ref 0.7–4.0)
MCHC: 34 g/dL (ref 30.0–36.0)
MCV: 100.5 fl — ABNORMAL HIGH (ref 78.0–100.0)
Monocytes Absolute: 0.4 10*3/uL (ref 0.1–1.0)
Monocytes Relative: 8.9 % (ref 3.0–12.0)
Neutro Abs: 2.5 10*3/uL (ref 1.4–7.7)
Neutrophils Relative %: 63.8 % (ref 43.0–77.0)
Platelets: 113 10*3/uL — ABNORMAL LOW (ref 150.0–400.0)
RBC: 3.98 Mil/uL — ABNORMAL LOW (ref 4.22–5.81)
RDW: 12.9 % (ref 11.5–15.5)
WBC: 4 10*3/uL (ref 4.0–10.5)

## 2023-09-19 LAB — VITAMIN B12: Vitamin B-12: 613 pg/mL (ref 211–911)

## 2023-09-19 LAB — HEPATIC FUNCTION PANEL
ALT: 19 U/L (ref 0–53)
AST: 19 U/L (ref 0–37)
Albumin: 4 g/dL (ref 3.5–5.2)
Alkaline Phosphatase: 62 U/L (ref 39–117)
Bilirubin, Direct: 0.1 mg/dL (ref 0.0–0.3)
Total Bilirubin: 0.5 mg/dL (ref 0.2–1.2)
Total Protein: 7.1 g/dL (ref 6.0–8.3)

## 2023-09-19 LAB — BASIC METABOLIC PANEL WITH GFR
BUN: 22 mg/dL (ref 6–23)
CO2: 27 meq/L (ref 19–32)
Calcium: 9.3 mg/dL (ref 8.4–10.5)
Chloride: 105 meq/L (ref 96–112)
Creatinine, Ser: 1.23 mg/dL (ref 0.40–1.50)
GFR: 58.86 mL/min — ABNORMAL LOW (ref >60.00)
Glucose, Bld: 103 mg/dL — ABNORMAL HIGH (ref 70–99)
Potassium: 4.4 meq/L (ref 3.5–5.1)
Sodium: 140 meq/L (ref 135–145)

## 2023-09-19 LAB — TSH: TSH: 2.05 u[IU]/mL (ref 0.35–5.50)

## 2023-09-19 LAB — CK: Total CK: 109 U/L (ref 7–232)

## 2023-09-19 MED ORDER — TAMSULOSIN HCL 0.4 MG PO CAPS: 0.4000 mg | ORAL_CAPSULE | Freq: Every day | ORAL | 3 refills | Status: DC

## 2023-09-19 MED ORDER — ROSUVASTATIN CALCIUM 20 MG PO TABS: 20.0000 mg | ORAL_TABLET | Freq: Every day | ORAL | 3 refills | Status: DC

## 2023-09-19 MED ORDER — OLMESARTAN MEDOXOMIL 20 MG PO TABS: 10.0000 mg | ORAL_TABLET | Freq: Every day | ORAL | 3 refills | Status: DC

## 2023-09-19 NOTE — Progress Notes (Signed)
 Subjective:  Patient ID: Eddie Calhoun, male    DOB: 1950-11-21  Age: 73 y.o. MRN: [id number]  CC: Hypertension and Hyperlipidemia   HPI Eddie Calhoun presents for f/up ----  Discussed the use of AI scribe software for clinical note transcription with the patient, who gave verbal consent to proceed.  History of Present Illness   Eddie Calhoun is a 73 year old male who presents for follow-up and medication management.  He has been experiencing symptoms consistent with a cold or sinus issue for about three weeks. He visited urgent care and was prescribed antibiotics and prednisone , along with a sinus spray. The symptoms have improved, but he still experiences some ear crackling and temporary hearing difficulty due to blockage.  Despite a history of low platelets, he denies any unusual bleeding or bruising. He notes that he bruises like anyone else when hit, such as during yard work, but nothing unusual or sensitive to bruising has been noted.  He has a loop recorder implanted for monitoring, which has not triggered any alerts. Prior to this, he wore an external monitor for several months. He denies any symptoms related to a low heart rate, stating that his heart rate is normal for him, typically around 56 beats per minute, and he does not experience dizziness or lightheadedness. He remains active, engaging in yard work and outdoor exercise.  He takes vitamin B12 and D3 supplements, having stopped multivitamins about a month ago. He denies any new stroke symptoms and reports that his monthly monitor checks have been clear.  He is on rosuvastatin  and denies any muscle aches, though he experiences joint aches attributed to arthritis, particularly in his fingers. He uses Voltaren cream with a glove for relief, especially during colder months, which has been effective.       Outpatient Medications Prior to Visit  Medication Sig Dispense Refill   acetaminophen  (TYLENOL ) 325 MG tablet Take  2 tablets (650 mg total) by mouth every 4 (four) hours as needed for mild pain (or temp > 37.5 C (99.5 F)). 30 tablet 0   aspirin  EC 81 MG tablet 1 tablet     baclofen  5 MG TABS Take 1 tablet (5 mg total) by mouth 3 (three) times daily as needed for muscle spasms. For spasticity 90 tablet 5   cholecalciferol (VITAMIN D3) 25 MCG (1000 UNIT) tablet Take 1 tablet (1,000 Units total) by mouth daily. 30 tablet 0   fluticasone  (FLONASE ) 50 MCG/ACT nasal spray Place 1 spray into both nostrils daily. 15.8 mL 0   Multiple Vitamin (MULTIVITAMIN) tablet Take 1 tablet by mouth daily.     vitamin B-12 (CYANOCOBALAMIN ) 1000 MCG tablet Take 1 tablet (1,000 mcg total) by mouth daily. 30 tablet 0   olmesartan  (BENICAR ) 20 MG tablet TAKE A HALF TABLET BY MOUTH DAILY 45 tablet 1   rosuvastatin  (CRESTOR ) 20 MG tablet TAKE 1 TABLET BY MOUTH DAILY 90 tablet 1   tamsulosin  (FLOMAX ) 0.4 MG CAPS capsule TAKE ONE CAPSULE BY MOUTH ONCE DAILY AFTER EVENING MEAL 90 capsule 0   cetirizine  (ZYRTEC  ALLERGY) 10 MG tablet Take 1 tablet (10 mg total) by mouth daily. 30 tablet 0   No facility-administered medications prior to visit.    ROS Review of Systems  Constitutional:  Negative for appetite change, chills, diaphoresis and fatigue.  HENT: Negative.    Respiratory: Negative.  Negative for chest tightness, shortness of breath and wheezing.   Cardiovascular:  Negative for chest pain, palpitations and leg  swelling.  Gastrointestinal: Negative.  Negative for abdominal pain, blood in stool, constipation, diarrhea, nausea and vomiting.  Genitourinary:  Negative for difficulty urinating and hematuria.  Musculoskeletal:  Positive for arthralgias and gait problem. Negative for myalgias.  Skin: Negative.   Neurological:  Negative for dizziness, weakness and light-headedness.  Hematological:  Negative for adenopathy. Does not bruise/bleed easily.  Psychiatric/Behavioral: Negative.      Objective:  BP 132/78 (BP Location: Left  Arm, Patient Position: Sitting, Cuff Size: Normal)   Pulse (!) 56   Temp 98 F (36.7 C) (Oral)   Resp 16   Ht 5\' 10"  (1.778 m)   Wt 183 lb (83 kg)   SpO2 98%   BMI 26.26 kg/m   BP Readings from Last 3 Encounters:  09/19/23 132/78  09/07/23 118/74  08/26/23 122/80    Wt Readings from Last 3 Encounters:  09/19/23 183 lb (83 kg)  08/09/23 184 lb (83.5 kg)  05/10/23 167 lb (75.8 kg)    Physical Exam Vitals reviewed.  Constitutional:      Appearance: Normal appearance.  HENT:     Mouth/Throat:     Mouth: Mucous membranes are moist.  Eyes:     General: No scleral icterus.    Conjunctiva/sclera: Conjunctivae normal.  Cardiovascular:     Rate and Rhythm: Regular rhythm. Bradycardia present.     Heart sounds: No murmur heard.    No friction rub. No gallop.  Pulmonary:     Effort: Pulmonary effort is normal.     Breath sounds: No stridor. No wheezing, rhonchi or rales.  Abdominal:     General: Abdomen is flat.     Palpations: There is no mass.     Tenderness: There is no abdominal tenderness. There is no guarding.     Hernia: No hernia is present.  Musculoskeletal:     Cervical back: Neck supple.     Right lower leg: No edema.     Left lower leg: No edema.  Lymphadenopathy:     Cervical: No cervical adenopathy.  Skin:    General: Skin is warm and dry.     Findings: No bruising.  Neurological:     Mental Status: He is alert. Mental status is at baseline.     Lab Results  Component Value Date   WBC 4.0 09/19/2023   HGB 13.6 09/19/2023   HCT 40.0 09/19/2023   PLT 113.0 (L) 09/19/2023   GLUCOSE 103 (H) 09/19/2023   CHOL 138 09/19/2023   TRIG 180.0 (H) 09/19/2023   HDL 53.70 09/19/2023   LDLCALC 49 09/19/2023   ALT 19 09/19/2023   AST 19 09/19/2023   NA 140 09/19/2023   K 4.4 09/19/2023   CL 105 09/19/2023   CREATININE 1.23 09/19/2023   BUN 22 09/19/2023   CO2 27 09/19/2023   TSH 2.05 09/19/2023   PSA 2.43 03/20/2023   INR 0.9 07/01/2021   HGBA1C 5.5  07/02/2021    No results found.  Assessment & Plan:   Vitamin B 12 deficiency -     CBC with Differential/Platelet; Future -     Vitamin B12; Future  Thrombocytopenia, unspecified (HCC)- PLTs are stable. No B/B. -     CBC with Differential/Platelet; Future -     Hepatic function panel; Future -     Vitamin B12; Future  Primary hypertension- BP is well controlled. -     Basic metabolic panel with GFR; Future -     TSH; Future -  Hepatic function panel; Future -     Olmesartan  Medoxomil; Take 0.5 tablets (10 mg total) by mouth daily.  Dispense: 45 tablet; Refill: 3  Dyslipidemia, goal LDL below 70- LDL goal achieved. Doing well on the statin  -     Lipid panel; Future -     TSH; Future -     CK; Future -     Hepatic function panel; Future -     Rosuvastatin  Calcium ; Take 1 tablet (20 mg total) by mouth daily.  Dispense: 90 tablet; Refill: 3  Benign prostatic hyperplasia with weak urinary stream -     Tamsulosin  HCl; Take 1 capsule (0.4 mg total) by mouth daily after supper.  Dispense: 90 capsule; Refill: 3  Stage 3a chronic kidney disease (HCC)- Will avoid nephrotoxic agents      Follow-up: Return in about 6 months (around 03/21/2024).  Sandra Crouch, MD

## 2023-09-19 NOTE — Patient Instructions (Signed)
 Thrombocytopenia Thrombocytopenia is a condition in which there are a low number of platelets in the blood. Platelets are also called thrombocytes. Platelets are parts of blood that stick together and form a clot to help the body stop bleeding after an injury. If you have too few platelets, your blood may have trouble clotting. This may cause you to bleed and bruise very easily. Some cases of thrombocytopenia are mild while others are more severe. What are the causes? This condition is caused by a low number of platelets in your blood. There are three main reasons for this: Your body not making enough platelets. This may be caused by: Bone marrow diseases. This include aplastic anemia, leukemia, and myelodysplastic anemia. Congenital thrombocytopenia. This is a condition that is passed from parent to child (inherited). Certain cancer treatments, including chemotherapy and radiation therapy. Infections from bacteria or viruses. Alcohol use disorder and alcoholism. Platelets not being released in the blood. This is called platelet sequestration and it can happen due to: An overactive spleen (hypersplenism). The spleen gathers up platelets from circulation, meaning that the platelets are not available to help with clotting your blood. The spleen can be enlarged because of scarring or other conditions. Gaucher disease. Your body destroying platelets too quickly. This may be caused by: An autoimmune disease that causes immune thrombocytopenia (ITP). ITP is sometimes associated with other autoimmune conditions such as lupus. Certain medicines, such as blood thinners. Certain blood clotting or bleeding disorders. Exposure to toxic chemicals, such as pesticides, lead, benzene, and arsenic. Pregnancy. What are the signs or symptoms? Symptoms of this condition are the result of poor blood clotting. They will vary depending on how low the platelet counts are. Symptoms may include: Bruising  easily. Bleeding from the mouth or nose. Heavy menstrual periods. Blood in the urine, stool (feces), or vomit. Purplish-red discolorations on the skin (purpura). A rash that looks like pinpoint, purplish-red spots (petechiae) on the lower legs. How is this diagnosed?  This condition may be diagnosed with blood tests and a physical exam. You may also have other tests, including: A sample of bone marrow (biopsy) may be removed to look for the original cells that make platelets. An ultrasound or CT scan of the abdomen to check for an enlarged spleen, enlarged lymph nodes, or liver problems. How is this treated? Treatment for this condition depends on the cause. Treatment may include: Treatment of another condition that is causing the low platelet count. Medicines to help protect your platelets from being destroyed. A replacement (transfusion) of platelets to stop or prevent bleeding. Surgery to remove the spleen. Follow these instructions at home: Medicines Take over-the-counter and prescription medicines only as told by your health care provider. Do not take any medicines that contain aspirin or NSAIDs, such as ibuprofen. These medicines increase your risk for dangerous bleeding. Activity Avoid activities that could cause injury or bruising, and follow instructions about how to prevent falls. Do not play contact sports. Ask your health care provider what activities are safe for you. Take extra care to protect yourself from burns when ironing or cooking. Take extra care not to cut yourself when you shave or when you use scissors, needles, knives, and other tools. General instructions  Check your skin and the inside of your mouth for bruising or bleeding as told by your health care provider. Wear a medical alert bracelet that says that you have a bleeding disorder. This can help you get the treatment you need in case of emergency. Check  your urine and stool for blood as told by your health  care provider. Do not drink alcohol. If you do drink alcohol, limit the amount that you drink. Minimize contact with toxic chemicals. Tell all your health care providers, including dental care providers and eye doctors, about your condition. Make sure to tell dental care providers before you have any procedure done, including dental cleanings. Keep all follow-up visits. This is important. Contact a health care provider if: You have unexplained bruising. You have new symptoms. You have symptoms that get worse. You have a fever. Get help right away if: You have severe bleeding from anywhere on your body. You have blood in your vomit, urine, or stool. You have an injury to your head. You have a sudden, severe headache. Summary Thrombocytopenia is a condition in which you have a low number of platelets in the blood. Platelets are parts of blood that stick together to form a clot. Symptoms of this condition are the result of poor blood clotting and may include bruising easily, bleeding from the nose or mouth, petechiae, and purpura. This condition may be diagnosed with blood tests and a physical exam. Treatment for this condition depends on the cause. This information is not intended to replace advice given to you by your health care provider. Make sure you discuss any questions you have with your health care provider. Document Revised: 10/06/2020 Document Reviewed: 10/06/2020 Elsevier Patient Education  2024 [employer].

## 2023-09-23 ENCOUNTER — Ambulatory Visit (INDEPENDENT_AMBULATORY_CARE_PROVIDER_SITE_OTHER): Payer: PRIVATE HEALTH INSURANCE

## 2023-09-23 DIAGNOSIS — I639 Cerebral infarction, unspecified: Secondary | ICD-10-CM

## 2023-09-23 LAB — CUP PACEART REMOTE DEVICE CHECK
Date Time Interrogation Session: 20250518232508
Implantable Pulse Generator Implant Date: 20231005

## 2023-09-24 ENCOUNTER — Ambulatory Visit: Payer: Self-pay | Admitting: Internal Medicine

## 2023-09-24 ENCOUNTER — Ambulatory Visit: Payer: Medicare Other | Admitting: Internal Medicine

## 2023-09-29 ENCOUNTER — Telehealth: Admitting: Emergency Medicine

## 2023-09-29 DIAGNOSIS — R609 Edema, unspecified: Secondary | ICD-10-CM

## 2023-09-29 DIAGNOSIS — T148XXA Other injury of unspecified body region, initial encounter: Secondary | ICD-10-CM

## 2023-09-29 DIAGNOSIS — S80811A Abrasion, right lower leg, initial encounter: Secondary | ICD-10-CM | POA: Diagnosis not present

## 2023-09-29 NOTE — Progress Notes (Signed)
 Virtual Visit Consent   [name], you are scheduled for a virtual visit with a [facility] provider today. Just as with appointments in the office, your consent must be obtained to participate. Your consent will be active for this visit and any virtual visit you may have with one of our providers in the next 365 days. If you have a MyChart account, a copy of this consent can be sent to you electronically.  As this is a virtual visit, video technology does not allow for your provider to perform a traditional examination. This may limit your provider's ability to fully assess your condition. If your provider identifies any concerns that need to be evaluated in person or the need to arrange testing (such as labs, EKG, etc.), we will make arrangements to do so. Although advances in technology are sophisticated, we cannot ensure that it will always work on either your end or our end. If the connection with a video visit is poor, the visit may have to be switched to a telephone visit. With either a video or telephone visit, we are not always able to ensure that we have a secure connection.  By engaging in this virtual visit, you consent to the provision of healthcare and authorize for your insurance to be billed (if applicable) for the services provided during this visit. Depending on your insurance coverage, you may receive a charge related to this service.  I need to obtain your verbal consent now. Are you willing to proceed with your visit today? Eddie Calhoun has provided verbal consent on 09/29/2023 for a virtual visit (video or telephone). Eddie Burger, NP  Date: 09/29/2023 7:11 PM   Virtual Visit via Video Note   I, Eddie Calhoun, connected with  Eddie Calhoun  ([id number], 07/01/1950) on 09/29/23 at  7:00 PM EDT by a video-enabled telemedicine application and verified that I am speaking with the correct person using two identifiers.  Location: Patient: Virtual Visit Location Patient:  Home Provider: Virtual Visit Location Provider: Home Office   I discussed the limitations of evaluation and management by telemedicine and the availability of in person appointments. The patient expressed understanding and agreed to proceed.    History of Present Illness: Eddie Calhoun is a 73 y.o. who identifies as a male who was assigned male at birth, and is being seen today for bruising and swelling on lateral and medial aspects of his right ankle.  A week ago, a branch fell on his right shin, causing a wound and bleeding.  He treated it with antibiotic ointment and bandage. Changes daily. 2 days later swelling and bruising appeared on outer right ankle, then later on the inner R ankle.  Swelling is reduced in the mornings after he has been lying down in bed at night.  He feels wound on shin is healing, it is smaller than it was.  It does have some redness around it but he feels the redness is receding.  No purulent drainage.  It does not feel warm or tender.   HPI: HPI  Problems:  Patient Active Problem List   Diagnosis Date Noted   Allergic rhinitis 09/19/2023   Benign prostatic hyperplasia with lower urinary tract symptoms 09/19/2023   Erectile dysfunction 09/19/2023   Pure hypercholesterolemia 09/19/2023   Vitamin B 12 deficiency 09/19/2023   Vitamin D  deficiency 09/19/2023   Stage 3a chronic kidney disease (HCC) 09/19/2023   Lumbar radiculopathy 05/10/2023   Left spastic hemiparesis (HCC) 06/22/2022  Abnormal gait due to muscle weakness 06/22/2022   Plantar fasciitis of right foot 06/22/2022   Cryptogenic stroke (HCC) 02/08/2022   Benign prostatic hyperplasia with weak urinary stream 12/12/2021   Dyslipidemia, goal LDL below 70 12/12/2021   Primary hypertension 12/12/2021   Iron deficiency anemia secondary to inadequate dietary iron intake 12/12/2021   Left foot drop 08/23/2021   Lacunar infarction (HCC) 07/04/2021   Thrombocytopenia, unspecified (HCC) 07/26/2012     Allergies:  Allergies  Allergen Reactions   Contrast Media [Iodinated Contrast Media] Hives   Dutasteride Other (See Comments)    Other reaction(s): itching Other reaction(s): itching   Medications:  Current Outpatient Medications:    acetaminophen  (TYLENOL ) 325 MG tablet, Take 2 tablets (650 mg total) by mouth every 4 (four) hours as needed for mild pain (or temp > 37.5 C (99.5 F))., Disp: 30 tablet, Rfl: 0   aspirin  EC 81 MG tablet, 1 tablet, Disp: , Rfl:    baclofen  5 MG TABS, Take 1 tablet (5 mg total) by mouth 3 (three) times daily as needed for muscle spasms. For spasticity, Disp: 90 tablet, Rfl: 5   cholecalciferol (VITAMIN D3) 25 MCG (1000 UNIT) tablet, Take 1 tablet (1,000 Units total) by mouth daily., Disp: 30 tablet, Rfl: 0   fluticasone  (FLONASE ) 50 MCG/ACT nasal spray, Place 1 spray into both nostrils daily., Disp: 15.8 mL, Rfl: 0   Multiple Vitamin (MULTIVITAMIN) tablet, Take 1 tablet by mouth daily., Disp: , Rfl:    olmesartan  (BENICAR ) 20 MG tablet, Take 0.5 tablets (10 mg total) by mouth daily., Disp: 45 tablet, Rfl: 3   rosuvastatin  (CRESTOR ) 20 MG tablet, Take 1 tablet (20 mg total) by mouth daily., Disp: 90 tablet, Rfl: 3   tamsulosin  (FLOMAX ) 0.4 MG CAPS capsule, Take 1 capsule (0.4 mg total) by mouth daily after supper., Disp: 90 capsule, Rfl: 3   vitamin B-12 (CYANOCOBALAMIN ) 1000 MCG tablet, Take 1 tablet (1,000 mcg total) by mouth daily., Disp: 30 tablet, Rfl: 0  Observations/Objective: Patient is well-developed, well-nourished in no acute distress.  Resting comfortably  at home.  Head is normocephalic, atraumatic.  No labored breathing.  Speech is clear and coherent with logical content.  Patient is alert and oriented at baseline.  On his right ankle, he has swelling in the medial and lateral aspects.  There is also some bruising in this area.  Looks like the bruise is healing.  Approximately 2 inch long linear abrasion on right anterior lower leg.  It is not  bleeding, it is covered by a scab.  There is some surrounding erythema, maybe 0.5-1 cm in size all around the wound   Assessment and Plan: 1. Abrasion of anterior right lower leg, initial encounter (Primary)  2. Swelling  3. Bruising  I think the swelling and bruising is due to the dependent position of his right ankle inferior to the wound.  I suspect he had some bleeding that has drained down to his ankle.  Wound looks like it is healing well.  He can continue his current care.  Follow Up Instructions: I discussed the assessment and treatment plan with the patient. The patient was provided an opportunity to ask questions and all were answered. The patient agreed with the plan and demonstrated an understanding of the instructions.  A copy of instructions were sent to the patient via MyChart unless otherwise noted below.   The patient was advised to call back or seek an in-person evaluation if the symptoms worsen or if  the condition fails to improve as anticipated.    Eddie Burger, NP

## 2023-09-29 NOTE — Patient Instructions (Signed)
 Dorena Gander, thank you for joining Blinda Burger, NP for today's virtual visit.  While this provider is not your primary care provider (PCP), if your PCP is located in our provider database this encounter information will be shared with them immediately following your visit.   A [facility] MyChart account gives you access to today's visit and all your visits, tests, and labs performed at [facility] " click here if you don't have a [facility] MyChart account or go to [email or web address]  Consent: (Patient) RAHEEN CAPILI provided verbal consent for this virtual visit at the beginning of the encounter.  Current Medications:  Current Outpatient Medications:    acetaminophen  (TYLENOL ) 325 MG tablet, Take 2 tablets (650 mg total) by mouth every 4 (four) hours as needed for mild pain (or temp > 37.5 C (99.5 F))., Disp: 30 tablet, Rfl: 0   aspirin  EC 81 MG tablet, 1 tablet, Disp: , Rfl:    baclofen  5 MG TABS, Take 1 tablet (5 mg total) by mouth 3 (three) times daily as needed for muscle spasms. For spasticity, Disp: 90 tablet, Rfl: 5   cholecalciferol (VITAMIN D3) 25 MCG (1000 UNIT) tablet, Take 1 tablet (1,000 Units total) by mouth daily., Disp: 30 tablet, Rfl: 0   fluticasone  (FLONASE ) 50 MCG/ACT nasal spray, Place 1 spray into both nostrils daily., Disp: 15.8 mL, Rfl: 0   Multiple Vitamin (MULTIVITAMIN) tablet, Take 1 tablet by mouth daily., Disp: , Rfl:    olmesartan  (BENICAR ) 20 MG tablet, Take 0.5 tablets (10 mg total) by mouth daily., Disp: 45 tablet, Rfl: 3   rosuvastatin  (CRESTOR ) 20 MG tablet, Take 1 tablet (20 mg total) by mouth daily., Disp: 90 tablet, Rfl: 3   tamsulosin  (FLOMAX ) 0.4 MG CAPS capsule, Take 1 capsule (0.4 mg total) by mouth daily after supper., Disp: 90 capsule, Rfl: 3   vitamin B-12 (CYANOCOBALAMIN ) 1000 MCG tablet, Take 1 tablet (1,000 mcg total) by mouth daily., Disp: 30 tablet, Rfl: 0   Medications ordered in this encounter:  No orders of  the defined types were placed in this encounter.    *If you need refills on other medications prior to your next appointment, please contact your pharmacy*  Follow-Up: Call back or seek an in-person evaluation if the symptoms worsen or if the condition fails to improve as anticipated.  [facility] [phone]  Other Instructions Continue keeping the wound clean and covering it with antibiotic ointment and a bandage.  Elevating your right leg can help reduce the swelling in that ankle.  Your body will absorb the blood that is causing the bruising.  You can help it along by soaking your ankle and foot in warm water with Epsom salt, however it will heal on its own as well.   If you have been instructed to have an in-person evaluation today at a local Urgent Care facility, please use the link below. It will take you to a list of all of our available [facility], including address, phone number and hours of operation. Please do not delay care.  [facility]  If you or a family member do not have a primary care provider, use the link below to schedule a visit and establish care. When you choose a [facility] primary care physician or advanced practice provider, you gain a long-term partner in health. Find a Primary Care Provider  Learn more about [facility]'s in-office and virtual care options: [facility] -  Get Care Now

## 2023-10-10 NOTE — Progress Notes (Signed)
 [facility] Summary Report / Loop Recorder

## 2023-10-24 ENCOUNTER — Ambulatory Visit: Payer: Self-pay | Admitting: Internal Medicine

## 2023-10-24 ENCOUNTER — Ambulatory Visit (INDEPENDENT_AMBULATORY_CARE_PROVIDER_SITE_OTHER): Payer: PRIVATE HEALTH INSURANCE

## 2023-10-24 DIAGNOSIS — I639 Cerebral infarction, unspecified: Secondary | ICD-10-CM | POA: Diagnosis not present

## 2023-10-24 LAB — CUP PACEART REMOTE DEVICE CHECK
Date Time Interrogation Session: 20250618231925
Implantable Pulse Generator Implant Date: 20231005

## 2023-11-06 ENCOUNTER — Telehealth: Payer: Self-pay | Admitting: Neurology

## 2023-11-06 ENCOUNTER — Encounter: Payer: Self-pay | Admitting: Physical Medicine and Rehabilitation

## 2023-11-06 ENCOUNTER — Encounter: Attending: Physical Medicine and Rehabilitation | Admitting: Physical Medicine and Rehabilitation

## 2023-11-06 VITALS — BP 134/84 | HR 51 | Ht 70.0 in | Wt 184.6 lb

## 2023-11-06 DIAGNOSIS — M21372 Foot drop, left foot: Secondary | ICD-10-CM | POA: Diagnosis not present

## 2023-11-06 DIAGNOSIS — I6381 Other cerebral infarction due to occlusion or stenosis of small artery: Secondary | ICD-10-CM | POA: Insufficient documentation

## 2023-11-06 DIAGNOSIS — M6281 Muscle weakness (generalized): Secondary | ICD-10-CM | POA: Diagnosis present

## 2023-11-06 DIAGNOSIS — R269 Unspecified abnormalities of gait and mobility: Secondary | ICD-10-CM | POA: Diagnosis present

## 2023-11-06 DIAGNOSIS — G8114 Spastic hemiplegia affecting left nondominant side: Secondary | ICD-10-CM | POA: Diagnosis not present

## 2023-11-06 MED ORDER — BACLOFEN 5 MG PO TABS: 5.0000 mg | ORAL_TABLET | Freq: Three times a day (TID) | ORAL | 5 refills | Status: DC | PRN

## 2023-11-06 NOTE — Progress Notes (Signed)
 Subjective:    Patient ID: Eddie Calhoun, male    DOB: [birth year], 73 y.o.   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 with L mild L hemiparesis and spasticity . Also has lumbar radiculopathy which is improving with improving foot drop on L-     Wife just had a 2nd shoulder replacement 1 week ago and back surgery 3-4 months ago- healing well- doing all th cooking.    Little things occurring- 5 weeks in ear/sinus infection- went to Urgent care.  Also tree bit him on R shin- bruising all the way to ankle  R great toe- ingrown toenail. Had surgery for this to prevent ingrown toenails- has interfered with walking.   Tweaked back as well-  almost 6-8 weeks ago.  More pronounced foot drop.  Not having any pain; but the foot drop is worse and decrease in function.  L knee and ankle is wonky.  Like a catch when he's walking.  Back is better from tweaking, but aggravated things.  Thinks gait getting a little better again, but has to conctrate on gait to do better.    Last 3 months, kind of slow-   [place] Cruise in May 2026 and [place] cruise December 2025 planned.  And October, going to [place] for cruise  10/26 as well.   Last ESI was late 12/24- has been >6 months since last injection.   Says walks faster than most people- of note.    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    Pain Inventory Average Pain 0 Pain Right Now 0 My pain is no pain  In the last 24 hours, has pain interfered with the following? General activity 0 Relation with others 0 Enjoyment of life 0 What TIME of day is your pain at its worst? no pain Sleep (in general) NA  Pain is worse with: no pain Pain  improves with: no pain Relief from Meds: no pain  Family History  Problem Relation Age of Onset   Hypertension Mother    Colon cancer Father 57   Hypertension Father    Cancer Father 21       father died with hx of prostate cancer   Esophageal cancer Neg Hx    Rectal cancer Neg Hx    Stomach cancer Neg Hx    Social History   Socioeconomic History   Marital status: Married    Spouse name: Not on file   Number of children: Not on file   Years of education: Not on file   Highest education level: Associate degree: occupational, Scientist, product/process development, or vocational program  Occupational History   Not on file  Tobacco Use   Smoking status: Former    Current packs/day: 0.00    Average packs/day: 2.0 packs/day for 20.0 years (40.0 ttl pk-yrs)    Types: Cigarettes    Start date: 09/21/1967    Quit date: 09/21/1987    Years since quitting: 36.1   Smokeless tobacco: Never  Vaping Use   Vaping status: Never Used  Substance and Sexual Activity   Alcohol use: Yes    Comment: social   Drug  use: Never   Sexual activity: Not Currently  Other Topics Concern   Not on file  Social History Narrative   Not on file   Social Drivers of Health   Financial Resource Strain: Low Risk  (09/15/2023)   Overall Financial Resource Strain (CARDIA)    Difficulty of Paying Living Expenses: Not hard at all  Food Insecurity: No Food Insecurity (09/15/2023)   Hunger Vital Sign    Worried About Running Out of Food in the Last Year: Never true    Ran Out of Food in the Last Year: Never true  Transportation Needs: No Transportation Needs (09/15/2023)   PRAPARE - Administrator, Civil Service (Medical): No    Lack of Transportation (Non-Medical): No  Physical Activity: Sufficiently Active (09/15/2023)   Exercise Vital Sign    Days of Exercise per Week: 5 days    Minutes of Exercise per Session: 30 min  Stress: No Stress Concern Present (09/15/2023)   [employer] of Occupational Health -  Occupational Stress Questionnaire    Feeling of Stress : Not at all  Social Connections: Moderately Integrated (09/15/2023)   Social Connection and Isolation Panel    Frequency of Communication with Friends and Family: Once a week    Frequency of Social Gatherings with Friends and Family: Once a week    Attends Religious Services: More than 4 times per year    Active Member of Clubs or Organizations: Yes    Attends Engineer, structural: More than 4 times per year    Marital Status: Married   Past Surgical History:  Procedure Laterality Date   CARPAL TUNNEL RELEASE Bilateral 04/2021   Past Surgical History:  Procedure Laterality Date   CARPAL TUNNEL RELEASE Bilateral 04/2021   Past Medical History:  Diagnosis Date   Allergy    allergic rhinitis   BPH (benign prostatic hyperplasia)    Elevated prostate specific antigen (PSA)    Hypertension    Implantable loop recorder present 02/04/2022   Plantar fasciitis of right foot    Pneumonia    Thrombocytopenia, unspecified (HCC) 07/26/2012   03/24/12  129,000!   BP 134/84   Pulse (!) 51   Ht 5' 10 (1.778 m)   Wt 184 lb 9.6 oz (83.7 kg)   SpO2 97%   BMI 26.49 kg/m   Opioid Risk Score:   Fall Risk Score:  `1  Depression screen [facility] 2/9     11/06/2023    9:23 AM 08/09/2023    8:46 AM 05/10/2023    1:21 PM 01/01/2023    1:07 PM 12/24/2022    9:13 AM 03/19/2022    2:35 PM 01/23/2022    8:35 AM  Depression screen PHQ 2/9  Decreased Interest 0 0 0 0 0 0 0  Down, Depressed, Hopeless 0 0 0 0 0 0 0  PHQ - 2 Score 0 0 0 0 0 0 0  Altered sleeping    0   0  Tired, decreased energy    0   0  Change in appetite    0   0  Feeling bad or failure about yourself     0   0  Trouble concentrating    0   0  Moving slowly or fidgety/restless    0   0  Suicidal thoughts    0   0  PHQ-9 Score    0   0  Difficult doing work/chores    Not difficult  at all   Not difficult at all     Review of Systems  Musculoskeletal:  Positive for  gait problem.  All other systems reviewed and are negative.      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 with L mild L hemiparesis and spasticity . Also has lumbar radiculopathy which is improving with improving foot drop on L-    I agree with ESI from [facility]- has been at least 6 months since last injection   2.  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 Con't Baclofen  5 mg 2-3x/day as needed for spasms- sounds well controlled- sent in refills to new pharmacy.    4.  I don't think his aching stiffness is from Crestor - usually that pain is Bilateral and significant/severe pain.     5.  Wife still has surgery planned on Neuroma likely Early 2026- he's been a great nurse!  6. F/U in 6 months- f/u on CVA    I spent a total of  31  minutes on total care today- >50% coordination of care- due to discussing his medical issues- spasticity, and hand pain. Also suggest ESI

## 2023-11-06 NOTE — Patient Instructions (Signed)
 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 with L mild L hemiparesis and spasticity . Also has lumbar radiculopathy which is improving with improving foot drop on L-    I agree with ESI from [facility]- has been at least 6 months since last injection   2.  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 Con't Baclofen  5 mg 2-3x/day as needed for spasms- sounds well controlled- sent in refills to new pharmacy.    4.  I don't think his aching stiffness is from Crestor - usually that pain is Bilateral and significant/severe pain.     5.  Wife still has surgery planned on Neuroma likely Early 2026- he's been a great nurse!  6. F/U in 6 months- f/u on CVA

## 2023-11-06 NOTE — Telephone Encounter (Signed)
 Appointment details confirmed

## 2023-11-14 NOTE — Progress Notes (Signed)
 [facility] Summary Report / Loop Recorder

## 2023-11-22 ENCOUNTER — Ambulatory Visit: Admitting: Podiatry

## 2023-11-22 DIAGNOSIS — L600 Ingrowing nail: Secondary | ICD-10-CM | POA: Diagnosis not present

## 2023-11-22 NOTE — Progress Notes (Signed)
 Subjective:  Patient ID: Eddie Calhoun, male    DOB: 03-10-1951,  MRN: [id number]  Chief Complaint  Patient presents with   Ingrown Toenail    Possible Ingrown toe nail. Left side of big toe.    73 y.o. male presents with the above complaint.  Patient presents with right hallux medial border ingrown painful to touch is progressive and worse worse with ambulation or shoe pressure patient would like to have it removed has not seen anyone as prior to seeing me would like to have this removed as well he understands no other goal   Review of Systems: Negative except as noted in the HPI. Denies N/V/F/Ch.  Past Medical History:  Diagnosis Date   Allergy    allergic rhinitis   BPH (benign prostatic hyperplasia)    Elevated prostate specific antigen (PSA)    Hypertension    Implantable loop recorder present 02/04/2022   Plantar fasciitis of right foot    Pneumonia    Thrombocytopenia, unspecified (HCC) 07/26/2012   03/24/12  129,000!    Current Outpatient Medications:    acetaminophen  (TYLENOL ) 325 MG tablet, Take 2 tablets (650 mg total) by mouth every 4 (four) hours as needed for mild pain (or temp > 37.5 C (99.5 F))., Disp: 30 tablet, Rfl: 0   aspirin  EC 81 MG tablet, 1 tablet, Disp: , Rfl:    Baclofen  5 MG TABS, Take 1 tablet (5 mg total) by mouth 3 (three) times daily as needed. For spasticity, Disp: 90 tablet, Rfl: 5   cholecalciferol (VITAMIN D3) 25 MCG (1000 UNIT) tablet, Take 1 tablet (1,000 Units total) by mouth daily., Disp: 30 tablet, Rfl: 0   Multiple Vitamin (MULTIVITAMIN) tablet, Take 1 tablet by mouth daily., Disp: , Rfl:    olmesartan  (BENICAR ) 20 MG tablet, Take 0.5 tablets (10 mg total) by mouth daily., Disp: 45 tablet, Rfl: 3   rosuvastatin  (CRESTOR ) 20 MG tablet, Take 1 tablet (20 mg total) by mouth daily., Disp: 90 tablet, Rfl: 3   tamsulosin  (FLOMAX ) 0.4 MG CAPS capsule, Take 1 capsule (0.4 mg total) by mouth daily after supper., Disp: 90 capsule, Rfl: 3    vitamin B-12 (CYANOCOBALAMIN ) 1000 MCG tablet, Take 1 tablet (1,000 mcg total) by mouth daily., Disp: 30 tablet, Rfl: 0  Social History   Tobacco Use  Smoking Status Former   Current packs/day: 0.00   Average packs/day: 2.0 packs/day for 20.0 years (40.0 ttl pk-yrs)   Types: Cigarettes   Start date: 09/21/1967   Quit date: 09/21/1987   Years since quitting: 36.1  Smokeless Tobacco Never    Allergies  Allergen Reactions   Contrast Media [Iodinated Contrast Media] Hives   Dutasteride Other (See Comments)    Other reaction(s): itching Other reaction(s): itching   Objective:  There were no vitals filed for this visit. There is no height or weight on file to calculate BMI. Constitutional Well developed. Well nourished.  Vascular Dorsalis pedis pulses palpable bilaterally. Posterior tibial pulses palpable bilaterally. Capillary refill normal to all digits.  No cyanosis or clubbing noted. Pedal hair growth normal.  Neurologic Normal speech. Oriented to person, place, and time. Epicritic sensation to light touch grossly present bilaterally.  Dermatologic Painful ingrowing nail at medial nail borders of the hallux nail right. No other open wounds. No skin lesions.  Orthopedic: Normal joint ROM without pain or crepitus bilaterally. No visible deformities. No bony tenderness.   Radiographs: None Assessment:  No diagnosis found. Plan:  Patient was evaluated and treated  and all questions answered.  Ingrown Nail, right -Patient elects to proceed with minor surgery to remove ingrown toenail removal today. Consent reviewed and signed by patient. -Ingrown nail excised. See procedure note. -Educated on post-procedure care including soaking. Written instructions provided and reviewed. -Patient to follow up in 2 weeks for nail check.  Procedure: Excision of Ingrown Toenail Location: Right 1st toe medial nail borders. Anesthesia: Lidocaine 1% plain; 1.5 mL and Marcaine 0.5% plain;  1.5 mL, digital block. Skin Prep: Betadine. Dressing: Silvadene; telfa; dry, sterile, compression dressing. Technique: Following skin prep, the toe was exsanguinated and a tourniquet was secured at the base of the toe. The affected nail border was freed, split with a nail splitter, and excised. Chemical matrixectomy was then performed with phenol and irrigated out with alcohol. The tourniquet was then removed and sterile dressing applied. Disposition: Patient tolerated procedure well. Patient to return in 2 weeks for follow-up.   No follow-ups on file.

## 2023-11-25 ENCOUNTER — Ambulatory Visit: Payer: PRIVATE HEALTH INSURANCE

## 2023-11-25 DIAGNOSIS — I639 Cerebral infarction, unspecified: Secondary | ICD-10-CM

## 2023-11-26 ENCOUNTER — Ambulatory Visit (INDEPENDENT_AMBULATORY_CARE_PROVIDER_SITE_OTHER): Payer: Medicare Other | Admitting: Neurology

## 2023-11-26 ENCOUNTER — Encounter: Payer: Self-pay | Admitting: Neurology

## 2023-11-26 VITALS — BP 149/89 | HR 49 | Ht 70.0 in | Wt 186.2 lb

## 2023-11-26 DIAGNOSIS — Z8673 Personal history of transient ischemic attack (TIA), and cerebral infarction without residual deficits: Secondary | ICD-10-CM

## 2023-11-26 DIAGNOSIS — M21372 Foot drop, left foot: Secondary | ICD-10-CM

## 2023-11-26 LAB — CUP PACEART REMOTE DEVICE CHECK
Date Time Interrogation Session: 20250720232805
Implantable Pulse Generator Implant Date: 20231005

## 2023-11-26 NOTE — Progress Notes (Signed)
 [facility] [address] [place]. [place] [address] [phone]       OFFICE FOLLOW UP VISIT NOTE  [name] Date of Birth:  13-Apr-1951 Medical Record Number:  [id number]   Referring MD:  Rolan Haggard PA-c  Reason for Referral:  Stroke  HPI: Initial visit 09/05/2021 : Eddie Calhoun is a 73 year old male seen today for initial office consultation visit for stroke.  He is accompanied by his wife.  History is obtained from them and review of electronic medical records and I have personally reviewed pertinent available imaging films in PACS.  He has a past medical history of hypertension, bilateral carpal tunnel surgeries, benign prostatic hypertrophy, seasonal allergies who presented on 07/01/2021 with sudden onset of left-sided heaviness involving face arm and leg and weakness.  He had difficulty raising his left arm and leg.  He called EMS who noticed facial asymmetry and significant left-sided weakness and activated a code stroke.  Noncontrast CT scan of the head was negative for any acute process.  Given recent carpal tunnel surgery there was careful discussion about risk benefits of IV TNK and patient agreed to proceed and this was administered with the door to needle time of only 9 minutes.  Patient did remarkably well and started improving shortly thereafter.  MRI scan of the brain showed a moderate-sized large basal ganglia and corona radiata infarct.  MR angiogram of the brain showed no significant proximal large vessel stenosis but did show some irregularities in the right P3 P4 segments.  Echocardiogram showed normal ejection fraction of 60 to 65% without any clot.  LDL cholesterol was 79 mg percent.  Hemoglobin A1c was 5.5.  MR angiogram of the neck showed no extracranial large vessel stenosis.  Patient started on dual antiplatelet therapy aspirin  and Plavix  and transferred to inpatient rehab and did really well he is currently at home and walking independently.  He  is doing still outpatient physical and Occupational Therapy.  His left hand strength is improved significantly though fine motor skills are still diminished.  His left foot is still heavy and drags and he has to wear an ankle-foot brace.  His on aspirin  tolerating well without bruising or bleeding.  His blood pressure is under good control.  He is tolerating Crestor  well without muscle aches or pains.  He has not had any recurrent stroke or TIA symptoms. Update 11/15/2021 ; He returns for follow-up after last visit 2 months ago.  Patient states he is doing well.  This is finished outpatient physical and Occupational Therapy.  Still has some diminished fine motor skills in the left foot with some numbness and and but is walking independently.  He is tolerating aspirin  well without bruising or bleeding.  His planning to see a personal trainer at the gym facility to continue to improve further.  Blood pressures well controlled today it is 114/67.  He remains on Crestor  which is tolerating well without muscle aches and pains.  He has an appointment coming to see Dr. Waddell to discuss loop recorder.  His insurance company refused it without first 2030-day heart monitor which was done on 09/20/2021 and showed no evidence of A-fib.  He has no new complaints today. Update 11/26/2022 : He returns for follow-up after last visit a year ago.  He states he is doing well without any recurrent stroke or TIA symptoms.  He remains on aspirin  which is tolerating well without bruising or bleeding.  He is tolerating Crestor  well without muscle aches  and pains.  Last lipid profile in 06/14/2022 showed LDL 56 mg%.  He has a loop recorder and interrogation on 11/09/2022 has not yet shown A-fib.  His blood pressure remains under good control though it is elevated slightly in the office today.  He has been eating a healthy diet and has lost weight which is now quite steady at 175 pounds.  He is quite active and walks a couple of miles a day as  well as goes to the gym frequently.  He continues to have mild left leg weakness in fact he had some leg pain and underwent MRI of the lumbar spine on 03/23/2022 which showed moderate spinal stenosis at L4-5 with left L5 root compression.  He underwent epidural steroid injection with good relief of his symptoms for several months but now his left leg seems to be bothering him again. 11/26/2023 : He returns for follow-up after last visit a year ago.  He continues to do well without recurrent TIA or stroke symptoms.  He remains on aspirin  which is tolerating well without bleeding or bruising.  He is tolerating Crestor  well without muscle aches and pains.  Last lipid profile checked on 09/19/2023 showed LDL-cholesterol to be 49 mg percent.  Triglycerides are slightly elevated at 180.  Blood pressure remains under good control at home with systolics in the 120s to 80s slightly elevated in office today at 149/89.  He continues to be active and walks 2 very well and uses gym frequently.  His left leg continues to be stiff and weak with left foot drop.  He has an upcoming appointment to see his back pain specialist and perhaps have another epidural steroid injection.  His last loop recorder interrogation on 10/23/2023 showed no evidence of paroxysmal A-fib yet.  Carotid ultrasound on 12/05/2022 at showed no significant extracranial stenosis.  He has no new complaints today ROS:   14 system review of systems is positive for slurred speech, foot numbness, weakness all other systems negative  PMH:  Past Medical History:  Diagnosis Date   Allergy    allergic rhinitis   BPH (benign prostatic hyperplasia)    Elevated prostate specific antigen (PSA)    Hypertension    Implantable loop recorder present 02/04/2022   Plantar fasciitis of right foot    Pneumonia    Thrombocytopenia, unspecified (HCC) 07/26/2012   03/24/12  129,000!    Social History:  Social History   Socioeconomic History   Marital status:  Married    Spouse name: Not on file   Number of children: Not on file   Years of education: Not on file   Highest education level: Associate degree: occupational, Scientist, product/process development, or vocational program  Occupational History   Not on file  Tobacco Use   Smoking status: Former    Current packs/day: 0.00    Average packs/day: 2.0 packs/day for 20.0 years (40.0 ttl pk-yrs)    Types: Cigarettes    Start date: 09/21/1967    Quit date: 09/21/1987    Years since quitting: 36.2   Smokeless tobacco: Never  Vaping Use   Vaping status: Never Used  Substance and Sexual Activity   Alcohol use: Yes    Comment: social   Drug use: Never   Sexual activity: Not Currently  Other Topics Concern   Not on file  Social History Narrative   Not on file   Social Drivers of Health   Financial Resource Strain: Low Risk  (09/15/2023)   Overall Physicist, medical Strain (  CARDIA)    Difficulty of Paying Living Expenses: Not hard at all  Food Insecurity: No Food Insecurity (09/15/2023)   Hunger Vital Sign    Worried About Running Out of Food in the Last Year: Never true    Ran Out of Food in the Last Year: Never true  Transportation Needs: No Transportation Needs (09/15/2023)   PRAPARE - Administrator, Civil Service (Medical): No    Lack of Transportation (Non-Medical): No  Physical Activity: Sufficiently Active (09/15/2023)   Exercise Vital Sign    Days of Exercise per Week: 5 days    Minutes of Exercise per Session: 30 min  Stress: No Stress Concern Present (09/15/2023)   [employer] of Occupational Health - Occupational Stress Questionnaire    Feeling of Stress : Not at all  Social Connections: Moderately Integrated (09/15/2023)   Social Connection and Isolation Panel    Frequency of Communication with Friends and Family: Once a week    Frequency of Social Gatherings with Friends and Family: Once a week    Attends Religious Services: More than 4 times per year    Active Member of [employer] or  Organizations: Yes    Attends Engineer, structural: More than 4 times per year    Marital Status: Married  Catering manager Violence: Not on file    Medications:   Current Outpatient Medications on File Prior to Visit  Medication Sig Dispense Refill   acetaminophen  (TYLENOL ) 325 MG tablet Take 2 tablets (650 mg total) by mouth every 4 (four) hours as needed for mild pain (or temp > 37.5 C (99.5 F)). 30 tablet 0   aspirin  EC 81 MG tablet 1 tablet     Baclofen  5 MG TABS Take 1 tablet (5 mg total) by mouth 3 (three) times daily as needed. For spasticity 90 tablet 5   cholecalciferol (VITAMIN D3) 25 MCG (1000 UNIT) tablet Take 1 tablet (1,000 Units total) by mouth daily. 30 tablet 0   Multiple Vitamin (MULTIVITAMIN) tablet Take 1 tablet by mouth daily.     olmesartan  (BENICAR ) 20 MG tablet Take 0.5 tablets (10 mg total) by mouth daily. 45 tablet 3   rosuvastatin  (CRESTOR ) 20 MG tablet Take 1 tablet (20 mg total) by mouth daily. 90 tablet 3   tamsulosin  (FLOMAX ) 0.4 MG CAPS capsule Take 1 capsule (0.4 mg total) by mouth daily after supper. 90 capsule 3   vitamin B-12 (CYANOCOBALAMIN ) 1000 MCG tablet Take 1 tablet (1,000 mcg total) by mouth daily. 30 tablet 0   No current facility-administered medications on file prior to visit.    Allergies:   Allergies  Allergen Reactions   Contrast Media [Iodinated Contrast Media] Hives   Dutasteride Other (See Comments)    Other reaction(s): itching Other reaction(s): itching    Physical Exam General: well developed, well nourished pleasant elderly male, seated, in no evident distress Head: head normocephalic and atraumatic.   Neck: supple with no carotid or supraclavicular bruits Cardiovascular: regular rate and rhythm, no murmurs Musculoskeletal: no deformity Skin:  no rash/petichiae Vascular:  Normal pulses all extremities  Neurologic Exam Mental Status: Awake and fully alert. Oriented to place and time. Recent and remote memory  intact. Attention span, concentration and fund of knowledge appropriate. Mood and affect appropriate.  Cranial Nerves: Fundoscopic exam not done. Pupils equal, briskly reactive to light. Extraocular movements full without nystagmus. Visual fields full to confrontation. Hearing intact. Facial sensation intact. Face, tongue, palate moves normally and symmetrically.  Motor: Normal bulk and tone. Normal strength in all tested extremity muscles.  Except slight diminished fine finger movements on the left.  Orbits right over left upper extremity.  Mild ankle dorsiflexor weakness on the left.  Tone is increased in the left leg.  Mild increase in tone in the left leg with spasticity. Sensory.: intact to touch , pinprick , position and vibratory sensation.  Coordination: Rapid alternating movements normal in all extremities. Finger-to-nose and heel-to-shin performed accurately bilaterally.  Unsteady while standing on left foot unsupported. Gait and Station: Arises from chair without difficulty. Stance is normal. Gait demonstrates  stiffness and dragging of the left leg with left foot drop..  Tandem walking not tested.   Reflexes: 1+ and asymmetric and brisker on the left. Toes downgoing.      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  Vascular risk factors of hyperlipidemia and hypertension.  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 I also advised the patient to eat a healthy diet with plenty of whole grains, cereals, fruits and vegetables, exercise regularly and maintain ideal body weight.  Check follow-up carotid ultrasound study.  Recommended follow-up with me in the future only as necessary and no scheduled appointment was made  Greater than 50% time during this 35-minute consultation visit was spent on counseling and coordination of care about his hemiparesis and cryptogenic stroke and discussion about evaluation and treatment plan and answering questions.  Eather Popp, MD Note: This document was prepared with digital dictation and possible smart phrase technology. Any transcriptional errors that result from this process are unintentional.

## 2023-11-26 NOTE — Patient Instructions (Addendum)
 I had a long d/w patient about his remote stroke, mild residual left sided leg weakness and leg spasticity,risk for recurrent stroke/TIAs, personally independently reviewed imaging studies and stroke evaluation results and answered questions.Continue aspirin  81 mg daily  for secondary stroke prevention and maintain strict control of hypertension with blood pressure goal below 130/90, diabetes with hemoglobin A1c goal below 6.5% and lipids with LDL cholesterol goal below 70 mg/dL. I also advised the patient to eat a healthy diet with plenty of whole grains, cereals, fruits and vegetables, exercise regularly and maintain ideal body weight.  Check follow-up carotid ultrasound study.  Recommended follow-up with me in the future only as necessary and no scheduled appointment was made

## 2023-11-27 ENCOUNTER — Ambulatory Visit: Payer: Self-pay | Admitting: Internal Medicine

## 2023-12-14 ENCOUNTER — Other Ambulatory Visit: Payer: Self-pay | Admitting: Internal Medicine

## 2023-12-14 DIAGNOSIS — E785 Hyperlipidemia, unspecified: Secondary | ICD-10-CM

## 2023-12-20 ENCOUNTER — Ambulatory Visit (HOSPITAL_COMMUNITY)
Admission: RE | Admit: 2023-12-20 | Discharge: 2023-12-20 | Disposition: A | Discharge status: Home / Self Care | Source: Ambulatory Visit | Attending: Neurology | Admitting: Neurology

## 2023-12-20 ENCOUNTER — Ambulatory Visit: Payer: Self-pay | Admitting: Neurology

## 2023-12-20 DIAGNOSIS — Z8673 Personal history of transient ischemic attack (TIA), and cerebral infarction without residual deficits: Secondary | ICD-10-CM | POA: Insufficient documentation

## 2023-12-20 DIAGNOSIS — I639 Cerebral infarction, unspecified: Secondary | ICD-10-CM | POA: Diagnosis not present

## 2023-12-24 NOTE — Progress Notes (Signed)
 [facility] Summary Report / Loop Recorder

## 2023-12-26 ENCOUNTER — Ambulatory Visit (INDEPENDENT_AMBULATORY_CARE_PROVIDER_SITE_OTHER): Payer: PRIVATE HEALTH INSURANCE

## 2023-12-26 DIAGNOSIS — I639 Cerebral infarction, unspecified: Secondary | ICD-10-CM | POA: Diagnosis not present

## 2023-12-26 LAB — CUP PACEART REMOTE DEVICE CHECK
Date Time Interrogation Session: 20250820233619
Implantable Pulse Generator Implant Date: 20231005

## 2023-12-29 ENCOUNTER — Ambulatory Visit: Payer: Self-pay | Admitting: Internal Medicine

## 2024-01-02 ENCOUNTER — Ambulatory Visit: Payer: Medicare Other

## 2024-01-02 ENCOUNTER — Ambulatory Visit (INDEPENDENT_AMBULATORY_CARE_PROVIDER_SITE_OTHER)

## 2024-01-02 VITALS — Ht 70.0 in | Wt 186.0 lb

## 2024-01-02 DIAGNOSIS — Z0000 Encounter for general adult medical examination without abnormal findings: Secondary | ICD-10-CM | POA: Diagnosis not present

## 2024-01-02 NOTE — Progress Notes (Signed)
 Subjective:   Eddie Calhoun is a 73 y.o. who presents for a Medicare Wellness preventive visit.  As a reminder, Annual Wellness Visits don't include a physical exam, and some assessments may be limited, especially if this visit is performed virtually. We may recommend an in-person follow-up visit with your provider if needed.  Visit Complete: Virtual I connected with  Eddie Calhoun on 01/02/24 by a audio enabled telemedicine application and verified that I am speaking with the correct person using two identifiers.  Patient Location: Home  Provider Location: Office/Clinic  I discussed the limitations of evaluation and management by telemedicine. The patient expressed understanding and agreed to proceed.  Vital Signs: Because this visit was a virtual/telehealth visit, some criteria may be missing or patient reported. Any vitals not documented were not able to be obtained and vitals that have been documented are patient reported.  VideoDeclined- This patient declined Librarian, academic. Therefore the visit was completed with audio only.  Persons Participating in Visit: Patient.  AWV Questionnaire: Yes: Patient Medicare AWV questionnaire was completed by the patient on 12/29/2023; I have confirmed that all information answered by patient is correct and no changes since this date.  Cardiac Risk Factors include: advanced age (>36men, >26 women);dyslipidemia;hypertension     Objective:    Today's Vitals   01/02/24 1054  Weight: 186 lb (84.4 kg)  Height: 5' 10 (1.778 m)   Body mass index is 26.69 kg/m.     01/02/2024   10:57 AM 01/01/2023    1:07 PM 07/18/2021    5:41 PM 07/18/2021   12:35 PM 07/04/2021    3:32 PM 07/02/2021    1:25 AM  Advanced Directives  Does Patient Have a Medical Advance Directive? Yes Yes Yes Yes Yes Yes  Type of Estate agent of [place];Living will Healthcare Power of [place];Living will Living  will;Healthcare Power of Attorney  Living will;Healthcare Power of Attorney Living will;Healthcare Power of Attorney  Does patient want to make changes to medical advance directive?     No - Patient declined No - Patient declined  Copy of Healthcare Power of Attorney in Chart? No - copy requested No - copy requested   No - copy requested     Current Medications (verified) Outpatient Encounter Medications as of 01/02/2024  Medication Sig   acetaminophen  (TYLENOL ) 325 MG tablet Take 2 tablets (650 mg total) by mouth every 4 (four) hours as needed for mild pain (or temp > 37.5 C (99.5 F)).   aspirin  EC 81 MG tablet 1 tablet   Baclofen  5 MG TABS Take 1 tablet (5 mg total) by mouth 3 (three) times daily as needed. For spasticity   cholecalciferol (VITAMIN D3) 25 MCG (1000 UNIT) tablet Take 1 tablet (1,000 Units total) by mouth daily.   olmesartan  (BENICAR ) 20 MG tablet Take 0.5 tablets (10 mg total) by mouth daily.   rosuvastatin  (CRESTOR ) 20 MG tablet TAKE 1 TABLET BY MOUTH DAILY   tamsulosin  (FLOMAX ) 0.4 MG CAPS capsule Take 1 capsule (0.4 mg total) by mouth daily after supper.   vitamin B-12 (CYANOCOBALAMIN ) 1000 MCG tablet Take 1 tablet (1,000 mcg total) by mouth daily.   Multiple Vitamin (MULTIVITAMIN) tablet Take 1 tablet by mouth daily.   No facility-administered encounter medications on file as of 01/02/2024.    Allergies (verified) Contrast media [iodinated contrast media] and Dutasteride   History: Past Medical History:  Diagnosis Date   Allergy    allergic rhinitis  BPH (benign prostatic hyperplasia)    Elevated prostate specific antigen (PSA)    Hypertension    Implantable loop recorder present 02/04/2022   Plantar fasciitis of right foot    Pneumonia    Thrombocytopenia, unspecified (HCC) 07/26/2012   03/24/12  129,000!   Past Surgical History:  Procedure Laterality Date   CARPAL TUNNEL RELEASE Bilateral 04/2021   ingrown toenail Left    Family History  Problem  Relation Age of Onset   Hypertension Mother    Colon cancer Father 101   Hypertension Father    Cancer Father 63       father died with hx of prostate cancer   Esophageal cancer Neg Hx    Rectal cancer Neg Hx    Stomach cancer Neg Hx    Social History   Socioeconomic History   Marital status: Married    Spouse name: Not on file   Number of children: Not on file   Years of education: Not on file   Highest education level: Associate degree: occupational, Scientist, product/process development, or vocational program  Occupational History   Not on file  Tobacco Use   Smoking status: Former    Current packs/day: 0.00    Average packs/day: 2.0 packs/day for 20.0 years (40.0 ttl pk-yrs)    Types: Cigarettes    Start date: 09/21/1967    Quit date: 09/21/1987    Years since quitting: 36.3   Smokeless tobacco: Never  Vaping Use   Vaping status: Never Used  Substance and Sexual Activity   Alcohol use: Yes    Comment: social   Drug use: Never   Sexual activity: Not Currently  Other Topics Concern   Not on file  Social History Narrative   Not on file   Social Drivers of Health   Financial Resource Strain: Low Risk  (01/02/2024)   Overall Financial Resource Strain (CARDIA)    Difficulty of Paying Living Expenses: Not hard at all  Food Insecurity: No Food Insecurity (01/02/2024)   Hunger Vital Sign    Worried About Running Out of Food in the Last Year: Never true    Ran Out of Food in the Last Year: Never true  Transportation Needs: No Transportation Needs (01/02/2024)   PRAPARE - Administrator, Civil Service (Medical): No    Lack of Transportation (Non-Medical): No  Physical Activity: Sufficiently Active (01/02/2024)   Exercise Vital Sign    Days of Exercise per Week: 7 days    Minutes of Exercise per Session: 30 min  Stress: No Stress Concern Present (01/02/2024)   [employer] of Occupational Health - Occupational Stress Questionnaire    Feeling of Stress: Not at all  Social Connections:  Moderately Integrated (01/02/2024)   Social Connection and Isolation Panel    Frequency of Communication with Friends and Family: Once a week    Frequency of Social Gatherings with Friends and Family: Once a week    Attends Religious Services: More than 4 times per year    Active Member of [employer] or Organizations: Yes    Attends Engineer, structural: More than 4 times per year    Marital Status: Married    Tobacco Counseling Counseling given: Not Answered    Clinical Intake:  Pre-visit preparation completed: Yes  Pain : No/denies pain     BMI - recorded: 26.69 Nutritional Status: BMI 25 -29 Overweight Nutritional Risks: None Diabetes: No  Lab Results  Component Value Date   HGBA1C 5.5 07/02/2021  How often do you need to have someone help you when you read instructions, pamphlets, or other written materials from your doctor or pharmacy?: 1 - Never  Interpreter Needed?: No  Information entered by :: Verdie Saba, CMA   Activities of Daily Living     01/02/2024   11:01 AM  In your present state of health, do you have any difficulty performing the following activities:  Hearing? 0  Vision? 0  Difficulty concentrating or making decisions? 0  Walking or climbing stairs? 0  Dressing or bathing? 0  Doing errands, shopping? 0  Preparing Food and eating ? N  Using the Toilet? N  In the past six months, have you accidently leaked urine? N  Do you have problems with loss of bowel control? N  Managing your Medications? N  Managing your Finances? N  Housekeeping or managing your Housekeeping? N    Patient Care Team: Joshua Debby CROME, MD as PCP - General (Internal Medicine) Kennyth Cy RAMAN, DO as Consulting Physician (Ophthalmology)  I have updated your Care Teams any recent Medical Services you may have received from other providers in the past year.     Assessment:   This is a routine wellness examination for Eddie Calhoun.  Hearing/Vision screen Hearing  Screening - Comments:: Denies hearing difficulties   Vision Screening - Comments:: Wears rx glasses - up to date with routine eye exams with [facility]   Goals Addressed               This Visit's Progress     Patient Stated (pt-stated)        Patient stated he plans to continue exercising (walking increasing pace)       Depression Screen     01/02/2024   11:02 AM 11/06/2023    9:23 AM 08/09/2023    8:46 AM 05/10/2023    1:21 PM 01/01/2023    1:07 PM 12/24/2022    9:13 AM 03/19/2022    2:35 PM  PHQ 2/9 Scores  PHQ - 2 Score 0 0 0 0 0 0 0  PHQ- 9 Score 0    0      Fall Risk     01/02/2024   11:01 AM 11/06/2023    9:22 AM 08/09/2023    8:46 AM 05/10/2023    1:21 PM 01/01/2023    1:07 PM  Fall Risk   Falls in the past year? 0 0 0 0 0  Number falls in past yr: 0  0  0  Injury with Fall? 0  0  0  Risk for fall due to : No Fall Risks    No Fall Risks  Follow up Falls evaluation completed;Falls prevention discussed    Falls prevention discussed    MEDICARE RISK AT HOME:  Medicare Risk at Home Any stairs in or around the home?: Yes If so, are there any without handrails?: No Home free of loose throw rugs in walkways, pet beds, electrical cords, etc?: Yes Adequate lighting in your home to reduce risk of falls?: Yes Life alert?: No Use of a cane, walker or w/c?: No Grab bars in the bathroom?: Yes Shower chair or bench in shower?: Yes Elevated toilet seat or a handicapped toilet?: Yes  TIMED UP AND GO:  Was the test performed?  No  Cognitive Function: 6CIT completed        01/02/2024   11:09 AM 01/01/2023    1:17 PM  6CIT Screen  What Year? 0 points  0 points  What month? 0 points 0 points  What time? 0 points 0 points  Count back from 20 0 points 0 points  Months in reverse 0 points 0 points  Repeat phrase 0 points 0 points  Total Score 0 points 0 points    Immunizations Immunization History  Administered Date(s) Administered   Fluzone Influenza virus  vaccine,trivalent (IIV3), split virus 02/16/2011, 02/28/2012, 02/16/2014, 01/19/2015, 01/10/2017   INFLUENZA, HIGH DOSE SEASONAL PF 01/05/2016, 01/17/2018   Influenza-Unspecified 02/11/2023   PFIZER(Purple Top)SARS-COV-2 Vaccination 06/11/2019, 07/06/2019   Pneumococcal Conjugate-13 12/14/2015   Pneumococcal Polysaccharide-23 01/21/2017, 06/14/2022   Td (Adult) 01/16/2007   Tdap 01/16/2007, 09/27/2016   Zoster Recombinant(Shingrix) 08/14/2017, 12/25/2017   Zoster, Live 02/28/2012, 12/25/2017    Screening Tests Health Maintenance  Topic Date Due   INFLUENZA VACCINE  12/06/2023   Medicare Annual Wellness (AWV)  01/01/2025   Colonoscopy  02/26/2025   DTaP/Tdap/Td (4 - Td or Tdap) 09/28/2026   Pneumococcal Vaccine: 50+ Years  Completed   Hepatitis C Screening  Completed   Zoster Vaccines- Shingrix  Completed   HPV VACCINES  Aged Out   Meningococcal B Vaccine  Aged Out   COVID-19 Vaccine  Discontinued    Health Maintenance  Health Maintenance Due  Topic Date Due   INFLUENZA VACCINE  12/06/2023   Health Maintenance Items Addressed:  01/02/2024  Additional Screening:  Vision Screening: Recommended annual ophthalmology exams for early detection of glaucoma and other disorders of the eye. Would you like a referral to an eye doctor? No    Dental Screening: Recommended annual dental exams for proper oral hygiene  Community Resource Referral / Chronic Care Management: CRR required this visit?  No   CCM required this visit?  No   Plan:    I have personally reviewed and noted the following in the patient's chart:   Medical and social history Use of alcohol, tobacco or illicit drugs  Current medications and supplements including opioid prescriptions. Patient is not currently taking opioid prescriptions. Functional ability and status Nutritional status Physical activity Advanced directives List of other physicians Hospitalizations, surgeries, and ER visits in previous 12  months Vitals Screenings to include cognitive, depression, and falls Referrals and appointments  In addition, I have reviewed and discussed with patient certain preventive protocols, quality metrics, and best practice recommendations. A written personalized care plan for preventive services as well as general preventive health recommendations were provided to patient.   Verdie CHRISTELLA Saba, CMA   01/02/2024   After Visit Summary: (MyChart) Due to this being a telephonic visit, the after visit summary with patients personalized plan was offered to patient via MyChart   Notes: Nothing significant to report at this time.

## 2024-01-02 NOTE — Patient Instructions (Addendum)
 [name] , Thank you for taking time out of your busy schedule to complete your Annual Wellness Visit with me. I enjoyed our conversation and look forward to speaking with you again next year. I, as well as your care team,  appreciate your ongoing commitment to your health goals. Please review the following plan we discussed and let me know if I can assist you in the future. Your Game plan/ To Do List    Referrals: If you haven't heard from the office you've been referred to, please reach out to them at the phone provided.   Follow up Visits: We will see or speak with you next year for your Next Medicare AWV with our clinical staff Have you seen your provider in the last 6 months (3 months if uncontrolled diabetes)? Yes  Clinician Recommendations:  Aim for 30 minutes of exercise or brisk walking, 6-8 glasses of water, and 5 servings of fruits and vegetables each day.       This is a list of the screenings recommended for you:  Health Maintenance  Topic Date Due   Flu Shot  12/06/2023   Medicare Annual Wellness Visit  01/01/2025   Colon Cancer Screening  02/26/2025   DTaP/Tdap/Td vaccine (4 - Td or Tdap) 09/28/2026   Pneumococcal Vaccine for age over 29  Completed   Hepatitis C Screening  Completed   Zoster (Shingles) Vaccine  Completed   HPV Vaccine  Aged Out   Meningitis B Vaccine  Aged Out   COVID-19 Vaccine  Discontinued    Advanced directives: (Copy Requested) Please bring a copy of your health care power of attorney and living will to the office to be added to your chart at your convenience. You can mail to [facility] [address] [place], [place] [address] or email to [email or web address] Advance Care Planning is important because it:  [x]  Makes sure you receive the medical care that is consistent with your values, goals, and preferences  [x]  It provides guidance to your family and loved ones and reduces their decisional burden about whether or  not they are making the right decisions based on your wishes.  Follow the link provided in your after visit summary or read over the paperwork we have mailed to you to help you started getting your Advance Directives in place. If you need assistance in completing these, please reach out to us  so that we can help you!

## 2024-01-27 ENCOUNTER — Ambulatory Visit (INDEPENDENT_AMBULATORY_CARE_PROVIDER_SITE_OTHER): Payer: PRIVATE HEALTH INSURANCE

## 2024-01-27 DIAGNOSIS — I639 Cerebral infarction, unspecified: Secondary | ICD-10-CM

## 2024-01-27 LAB — CUP PACEART REMOTE DEVICE CHECK
Date Time Interrogation Session: 20250921231850
Implantable Pulse Generator Implant Date: 20231005

## 2024-01-28 NOTE — Progress Notes (Signed)
 Remote Loop Recorder Transmission

## 2024-02-01 ENCOUNTER — Ambulatory Visit: Payer: Self-pay | Admitting: Internal Medicine

## 2024-02-10 NOTE — Progress Notes (Signed)
 Remote Loop Recorder Transmission

## 2024-02-25 ENCOUNTER — Other Ambulatory Visit: Payer: Self-pay | Admitting: Internal Medicine

## 2024-02-25 DIAGNOSIS — I10 Essential (primary) hypertension: Secondary | ICD-10-CM

## 2024-02-28 ENCOUNTER — Ambulatory Visit: Attending: Internal Medicine

## 2024-02-28 DIAGNOSIS — I639 Cerebral infarction, unspecified: Secondary | ICD-10-CM

## 2024-02-28 LAB — CUP PACEART REMOTE DEVICE CHECK
Date Time Interrogation Session: 20251023233103
Implantable Pulse Generator Implant Date: 20231005

## 2024-03-03 NOTE — Progress Notes (Signed)
 Remote Loop Recorder Transmission

## 2024-03-05 ENCOUNTER — Ambulatory Visit: Payer: Self-pay | Admitting: Internal Medicine

## 2024-03-23 ENCOUNTER — Ambulatory Visit: Admitting: Internal Medicine

## 2024-03-23 ENCOUNTER — Encounter: Payer: Self-pay | Admitting: Internal Medicine

## 2024-03-23 VITALS — BP 138/82 | HR 51 | Temp 97.8°F | Resp 16 | Ht 70.0 in | Wt 190.6 lb

## 2024-03-23 DIAGNOSIS — N401 Enlarged prostate with lower urinary tract symptoms: Secondary | ICD-10-CM

## 2024-03-23 DIAGNOSIS — I10 Essential (primary) hypertension: Secondary | ICD-10-CM | POA: Diagnosis not present

## 2024-03-23 DIAGNOSIS — E538 Deficiency of other specified B group vitamins: Secondary | ICD-10-CM | POA: Diagnosis not present

## 2024-03-23 DIAGNOSIS — D696 Thrombocytopenia, unspecified: Secondary | ICD-10-CM

## 2024-03-23 DIAGNOSIS — R3912 Poor urinary stream: Secondary | ICD-10-CM

## 2024-03-23 DIAGNOSIS — E785 Hyperlipidemia, unspecified: Secondary | ICD-10-CM

## 2024-03-23 LAB — URINALYSIS, ROUTINE W REFLEX MICROSCOPIC
Bilirubin Urine: NEGATIVE
Hgb urine dipstick: NEGATIVE
Ketones, ur: NEGATIVE
Leukocytes,Ua: NEGATIVE
Nitrite: NEGATIVE
RBC / HPF: NONE SEEN (ref 0–?)
Specific Gravity, Urine: 1.02 (ref 1.000–1.030)
Total Protein, Urine: NEGATIVE
Urine Glucose: NEGATIVE
Urobilinogen, UA: 0.2 (ref 0.0–1.0)
pH: 6 (ref 5.0–8.0)

## 2024-03-23 LAB — CBC WITH DIFFERENTIAL/PLATELET
Basophils Absolute: 0 K/uL (ref 0.0–0.1)
Basophils Relative: 0.4 % (ref 0.0–3.0)
Eosinophils Absolute: 0.3 K/uL (ref 0.0–0.7)
Eosinophils Relative: 7 % — ABNORMAL HIGH (ref 0.0–5.0)
HCT: 38.5 % — ABNORMAL LOW (ref 39.0–52.0)
Hemoglobin: 13 g/dL (ref 13.0–17.0)
Lymphocytes Relative: 20 % (ref 12.0–46.0)
Lymphs Abs: 0.9 K/uL (ref 0.7–4.0)
MCHC: 33.7 g/dL (ref 30.0–36.0)
MCV: 100.8 fl — ABNORMAL HIGH (ref 78.0–100.0)
Monocytes Absolute: 0.4 K/uL (ref 0.1–1.0)
Monocytes Relative: 8.9 % (ref 3.0–12.0)
Neutro Abs: 2.8 K/uL (ref 1.4–7.7)
Neutrophils Relative %: 63.7 % (ref 43.0–77.0)
Platelets: 133 K/uL — ABNORMAL LOW (ref 150.0–400.0)
RBC: 3.82 Mil/uL — ABNORMAL LOW (ref 4.22–5.81)
RDW: 13.3 % (ref 11.5–15.5)
WBC: 4.3 K/uL (ref 4.0–10.5)

## 2024-03-23 LAB — BASIC METABOLIC PANEL WITH GFR
BUN: 23 mg/dL (ref 6–23)
CO2: 26 meq/L (ref 19–32)
Calcium: 8.8 mg/dL (ref 8.4–10.5)
Chloride: 105 meq/L (ref 96–112)
Creatinine, Ser: 1.17 mg/dL (ref 0.40–1.50)
GFR: 62.27 mL/min (ref >60.00)
Glucose, Bld: 92 mg/dL (ref 70–99)
Potassium: 4.3 meq/L (ref 3.5–5.1)
Sodium: 141 meq/L (ref 135–145)

## 2024-03-23 LAB — VITAMIN B12: Vitamin B-12: 403 pg/mL (ref 211–911)

## 2024-03-23 LAB — PSA: PSA: 2.13 ng/mL (ref 0.10–4.00)

## 2024-03-23 LAB — FOLATE: Folate: >23.7 ng/mL (ref >5.9)

## 2024-03-23 NOTE — Patient Instructions (Signed)
 Bradycardia, Adult Bradycardia is a slower-than-normal heartbeat. A normal resting heart rate for an adult ranges from 60 to 100 beats per minute. With bradycardia, the resting heart rate is less than 60 beats per minute. Bradycardia can prevent enough oxygen  from reaching certain areas of your body when you are active. It can be serious if it keeps enough oxygen  from reaching your brain and other parts of your body. Bradycardia is not a problem for everyone. For some healthy adults, a slow resting heart rate is normal. What are the causes? This condition may be caused by: A problem with the heart, including: A problem with the heart's electrical system, such as a heart block. With a heart block, electrical signals between the chambers of the heart are partially or completely blocked, so they are not able to work as they should. A problem with the heart's natural pacemaker (sinus node). Heart disease. A heart attack. Heart damage. Lyme disease. A heart infection. A heart condition that is present at birth (congenital heart defect). Certain medicines that treat heart conditions. Certain conditions, such as hypothyroidism and obstructive sleep apnea. Problems with the balance of chemicals and other substances, like potassium, in the blood. Trauma. Radiation therapy. What increases the risk? You are more likely to develop this condition if you: Are age 30 or older. Have high blood pressure (hypertension), high cholesterol (hyperlipidemia), or diabetes. Drink heavily, use tobacco or nicotine products, or use drugs. What are the signs or symptoms? Symptoms of this condition include: Light-headedness. Feeling faint or fainting. Fatigue and weakness. Trouble with activity or exercise. Shortness of breath. Chest pain (angina). Drowsiness. Confusion. Dizziness. How is this diagnosed? This condition may be diagnosed based on: Your symptoms. Your medical history. A physical exam. During  the exam, your health care provider will listen to your heartbeat and check your pulse. To confirm the diagnosis, your health care provider may order tests, such as: Blood tests. An electrocardiogram (ECG). This test records the heart's electrical activity. The test can show how fast your heart is beating and whether the heartbeat is steady. A test in which you wear a portable device (event recorder or Holter monitor) to record your heart's electrical activity while you go about your day. An exercise test. How is this treated? Treatment for this condition depends on the cause of the condition and how severe your symptoms are. Treatment may involve: Treatment of the underlying condition. Changing your medicines or how much medicine you take. Having a small, battery-operated device called a pacemaker implanted under the skin. When bradycardia occurs, this device can be used to increase your heart rate and help your heart beat in a regular rhythm. Follow these instructions at home: Lifestyle Manage any health conditions that contribute to bradycardia as told by your health care provider. Follow a heart-healthy diet. A nutrition specialist (dietitian) can help educate you about healthy food options and changes. Follow an exercise program that is approved by your health care provider. Maintain a healthy weight. Try to reduce or manage your stress, such as with yoga or meditation. If you need help reducing stress, ask your health care provider. Do not use any products that contain nicotine or tobacco. These products include cigarettes, chewing tobacco, and vaping devices, such as e-cigarettes. If you need help quitting, ask your health care provider. Do not use illegal drugs. Alcohol  use If you drink alcohol : Limit how much you have to: 0-1 drink a day for women who are not pregnant. 0-2 drinks a day  for men. Know how much alcohol  is in a drink. In the [place] one drink equals one 12 oz bottle of  beer (355 mL), one 5 oz glass of wine (148 mL), or one 1 oz glass of hard liquor (44 mL). General instructions Take over-the-counter and prescription medicines only as told by your health care provider. Keep all follow-up visits. This is important. How is this prevented? In some cases, bradycardia may be prevented by: Treating underlying medical problems. Stopping behaviors or medicines that can trigger the condition. Contact a health care provider if: You feel light-headed or dizzy. You almost faint. You feel weak or are easily fatigued during physical activity. You experience confusion or have memory problems. Get help right away if: You faint. You have chest pains or an irregular heartbeat (palpitations). You have trouble breathing. These symptoms may represent a serious problem that is an emergency. Do not wait to see if the symptoms will go away. Get medical help right away. Call your local emergency services (911 in the [place] Do not drive yourself to the hospital. Summary Bradycardia is a slower-than-normal heartbeat. With bradycardia, the resting heart rate is less than 60 beats per minute. Treatment for this condition depends on the cause. Manage any health conditions that contribute to bradycardia as told by your health care provider. Do not use any products that contain nicotine or tobacco. These products include cigarettes, chewing tobacco, and vaping devices, such as e-cigarettes. Keep all follow-up visits. This is important. This information is not intended to replace advice given to you by your health care provider. Make sure you discuss any questions you have with your health care provider. Document Revised: 08/14/2020 Document Reviewed: 08/14/2020 Elsevier Patient Education  2024 [employer].

## 2024-03-23 NOTE — Progress Notes (Unsigned)
 Subjective:  Patient ID: THIAGO RAGSDALE, male    DOB: 1950-11-02  Age: 73 y.o. MRN: [id number]  CC: Hypertension and Hyperlipidemia   HPI PAIGE VANDERWOUDE presents for f/up ---  Discussed the use of AI scribe software for clinical note transcription with the patient, who gave verbal consent to proceed.  History of Present Illness HAMDAN TOSCANO is a 73 year old male who presents for routine follow-up.  He has a consistently low heart rate, typically between 50 and 60 beats per minute, without symptoms such as weakness, dizziness, or lightheadedness. During exercise, his heart rate can increase to 120 beats per minute or higher without issue.  He remains physically active, engaging in yard work and walking. Previously, he could walk over two miles at a speed of 3.5 miles per hour, though he has reduced his distance recently. No chest pain or shortness of breath during exertion.  He has a history of stroke, resulting in muscle mass reduction in his left leg. Occasionally, he notices swelling in his legs, particularly after prolonged sitting or standing, but this resolves with movement.  He takes Flomax  at night for urinary symptoms, which include variable urination speed.  He is on aspirin  and has noticed increased bruising, particularly when he bumps himself, but the bruises resolve quickly. He also experiences arthritis-related pain in his hands, with occasional trigger finger symptoms.  He has received all his vaccinations, including flu, COVID, and RSV, in late September to early October.  He takes vitamin D3 at a dose of 1000 mg daily.     Outpatient Medications Prior to Visit  Medication Sig Dispense Refill   acetaminophen  (TYLENOL ) 325 MG tablet Take 2 tablets (650 mg total) by mouth every 4 (four) hours as needed for mild pain (or temp > 37.5 C (99.5 F)). 30 tablet 0   aspirin  EC 81 MG tablet 1 tablet     Baclofen  5 MG TABS Take 1 tablet (5 mg total) by mouth 3 (three)  times daily as needed. For spasticity 90 tablet 5   cholecalciferol (VITAMIN D3) 25 MCG (1000 UNIT) tablet Take 1 tablet (1,000 Units total) by mouth daily. 30 tablet 0   vitamin B-12 (CYANOCOBALAMIN ) 1000 MCG tablet Take 1 tablet (1,000 mcg total) by mouth daily. 30 tablet 0   olmesartan  (BENICAR ) 20 MG tablet TAKE A HALF TABLET BY MOUTH DAILY 45 tablet 1   rosuvastatin  (CRESTOR ) 20 MG tablet TAKE 1 TABLET BY MOUTH DAILY 90 tablet 0   tamsulosin  (FLOMAX ) 0.4 MG CAPS capsule Take 1 capsule (0.4 mg total) by mouth daily after supper. 90 capsule 3   Multiple Vitamin (MULTIVITAMIN) tablet Take 1 tablet by mouth daily.     No facility-administered medications prior to visit.    ROS Review of Systems  Constitutional: Negative.  Negative for appetite change, chills, diaphoresis, fatigue and fever.  HENT: Negative.    Eyes: Negative.  Negative for visual disturbance.  Respiratory:  Negative for cough, chest tightness, shortness of breath and wheezing.   Cardiovascular:  Negative for chest pain, palpitations and leg swelling.  Gastrointestinal: Negative.  Negative for abdominal pain, constipation, diarrhea, nausea and vomiting.  Genitourinary: Negative.  Negative for difficulty urinating and hematuria.  Musculoskeletal:  Positive for gait problem. Negative for myalgias.  Skin: Negative.   Neurological:  Positive for weakness. Negative for dizziness, syncope, light-headedness and numbness.  Hematological:  Negative for adenopathy. Bruises/bleeds easily.  Psychiatric/Behavioral: Negative.      Objective:  BP 138/82 (  BP Location: Left Arm, Patient Position: Sitting, Cuff Size: Normal)   Pulse (!) 51   Temp 97.8 F (36.6 C) (Oral)   Resp 16   Ht 5' 10 (1.778 m)   Wt 190 lb 9.6 oz (86.5 kg)   SpO2 97%   BMI 27.35 kg/m   BP Readings from Last 3 Encounters:  03/23/24 138/82  11/26/23 (!) 149/89  11/06/23 134/84    Wt Readings from Last 3 Encounters:  03/23/24 190 lb 9.6 oz (86.5 kg)   01/02/24 186 lb (84.4 kg)  11/26/23 186 lb 3.2 oz (84.5 kg)    Physical Exam Vitals reviewed.  Constitutional:      Appearance: Normal appearance.  HENT:     Nose: Nose normal.     Mouth/Throat:     Mouth: Mucous membranes are moist.  Eyes:     General: No scleral icterus.    Conjunctiva/sclera: Conjunctivae normal.  Cardiovascular:     Rate and Rhythm: Regular rhythm. Bradycardia present.     Heart sounds: No murmur heard.    No friction rub. No gallop.  Pulmonary:     Effort: Pulmonary effort is normal.     Breath sounds: No stridor. No wheezing, rhonchi or rales.  Abdominal:     General: Abdomen is flat. Bowel sounds are normal.     Tenderness: There is no abdominal tenderness. There is no guarding.  Musculoskeletal:        General: No swelling.     Cervical back: Neck supple.     Right lower leg: No edema.     Left lower leg: No edema.  Lymphadenopathy:     Cervical: No cervical adenopathy.  Skin:    General: Skin is warm and dry.     Coloration: Skin is not jaundiced or pale.     Findings: No bruising.  Neurological:     Mental Status: He is alert. Mental status is at baseline.     Lab Results  Component Value Date   WBC 4.3 03/23/2024   HGB 13.0 03/23/2024   HCT 38.5 (L) 03/23/2024   PLT 133.0 (L) 03/23/2024   GLUCOSE 92 03/23/2024   CHOL 138 09/19/2023   TRIG 180.0 (H) 09/19/2023   HDL 53.70 09/19/2023   LDLCALC 49 09/19/2023   ALT 19 09/19/2023   AST 19 09/19/2023   NA 141 03/23/2024   K 4.3 03/23/2024   CL 105 03/23/2024   CREATININE 1.17 03/23/2024   BUN 23 03/23/2024   CO2 26 03/23/2024   TSH 2.05 09/19/2023   PSA 2.13 03/23/2024   INR 0.9 07/01/2021   HGBA1C 5.5 07/02/2021    VAS US  CAROTID Result Date: 12/20/2023 Carotid Arterial Duplex Study Patient Name:  ARISTOTLE LIEB  Date of Exam:   12/20/2023 Medical Rec #: [id number]        Accession #:    [id number] Date of Birth: 09/07/1950        Patient Gender: M Patient Age:   58 years Exam  Location:  [facility] Procedure:      VAS US  CAROTID Referring Phys: PRAMOD SETHI --------------------------------------------------------------------------------  Indications:       CVA. Risk Factors:      Hypertension, past history of smoking, prior CVA. Other Factors:     History of right subcortical infarct in February 2023. Mild                    left leg and hand weakness and spasticity. Comparison  Study:  12/06/22 - minimal thickening or plaque bilaterally. Stable                    today's results. Performing Technologist: Ricka Sturdivant-Drayden Lukas RDMS, RVT  Examination Guidelines: A complete evaluation includes B-mode imaging, spectral Doppler, color Doppler, and power Doppler as needed of all accessible portions of each vessel. Bilateral testing is considered an integral part of a complete examination. Limited examinations for reoccurring indications may be performed as noted.  Right Carotid Findings: +----------+--------+--------+--------+------------------+------------------+           PSV cm/sEDV cm/sStenosisPlaque DescriptionComments           +----------+--------+--------+--------+------------------+------------------+ CCA Prox  101     16                                                   +----------+--------+--------+--------+------------------+------------------+ CCA Distal74      19                                intimal thickening +----------+--------+--------+--------+------------------+------------------+ ICA Prox  45      16                                intimal thickening +----------+--------+--------+--------+------------------+------------------+ ICA Distal92      31                                tortuous           +----------+--------+--------+--------+------------------+------------------+ ECA       116     17                                                   +----------+--------+--------+--------+------------------+------------------+  +----------+--------+-------+----------------+-------------------+           PSV cm/sEDV cmsDescribe        Arm Pressure (mmHG) +----------+--------+-------+----------------+-------------------+ [id number]            Multiphasic, WNL                    +----------+--------+-------+----------------+-------------------+ +---------+--------+--+--------+--+---------+ VertebralPSV cm/s34EDV cm/s11Antegrade +---------+--------+--+--------+--+---------+  Left Carotid Findings: +----------+--------+--------+--------+------------------+------------------+           PSV cm/sEDV cm/sStenosisPlaque DescriptionComments           +----------+--------+--------+--------+------------------+------------------+ CCA Prox  80      19                                                   +----------+--------+--------+--------+------------------+------------------+ CCA Distal66      22                                intimal thickening +----------+--------+--------+--------+------------------+------------------+ ICA Prox  55      23  intimal thickening +----------+--------+--------+--------+------------------+------------------+ ICA Distal74      34                                                   +----------+--------+--------+--------+------------------+------------------+ ECA       81      14                                                   +----------+--------+--------+--------+------------------+------------------+ +----------+--------+--------+----------------+-------------------+           PSV cm/sEDV cm/sDescribe        Arm Pressure (mmHG) +----------+--------+--------+----------------+-------------------+ [id number]             Multiphasic, WNL                    +----------+--------+--------+----------------+-------------------+ +---------+--------+--+--------+--+---------+ VertebralPSV cm/s37EDV cm/s15Antegrade  +---------+--------+--+--------+--+---------+   Summary: Right Carotid: The extracranial vessels were near-normal with only minimal wall                thickening or plaque. Left Carotid: The extracranial vessels were near-normal with only minimal wall               thickening or plaque. Vertebrals:  Bilateral vertebral arteries demonstrate antegrade flow. Subclavians: Normal flow hemodynamics were seen in bilateral subclavian              arteries. *See table(s) above for measurements and observations.  Electronically signed by Eather Popp MD on 12/20/2023 at 11:40:34 AM.    Final     Assessment & Plan:   Benign prostatic hyperplasia with weak urinary stream -     PSA; Future -     Urinalysis, Routine w reflex microscopic; Future -     Tamsulosin  HCl; Take 1 capsule (0.4 mg total) by mouth daily after supper.  Dispense: 90 capsule; Refill: 1  Thrombocytopenia, unspecified- No bleeding. -     CBC with Differential/Platelet; Future -     Folate; Future -     Vitamin B12; Future -     Methylmalonic acid, serum; Future  Primary hypertension- His BP is well controlled. -     Basic metabolic panel with GFR; Future -     Urinalysis, Routine w reflex microscopic; Future -     Olmesartan  Medoxomil; Take 0.5 tablets (10 mg total) by mouth daily.  Dispense: 45 tablet; Refill: 1  Vitamin B 12 deficiency -     Vitamin B12; Future -     Methylmalonic acid, serum; Future  Dyslipidemia, goal LDL below 70- LDL goal achieved. Doing well on the statin  -     Rosuvastatin  Calcium ; Take 1 tablet (20 mg total) by mouth daily.  Dispense: 90 tablet; Refill: 1     Follow-up: Return in about 6 months (around 09/20/2024).  Debby Molt, MD

## 2024-03-26 ENCOUNTER — Ambulatory Visit: Payer: Self-pay | Admitting: Internal Medicine

## 2024-03-26 LAB — METHYLMALONIC ACID, SERUM: Methylmalonic Acid, Quant: 98 nmol/L (ref 69–390)

## 2024-03-26 MED ORDER — TAMSULOSIN HCL 0.4 MG PO CAPS
0.4000 mg | ORAL_CAPSULE | Freq: Every day | ORAL | 1 refills | Status: AC
Start: 2024-03-26 — End: ?

## 2024-03-26 MED ORDER — OLMESARTAN MEDOXOMIL 20 MG PO TABS: 10.0000 mg | ORAL_TABLET | Freq: Every day | ORAL | 1 refills | Status: AC

## 2024-03-26 MED ORDER — ROSUVASTATIN CALCIUM 20 MG PO TABS: 20.0000 mg | ORAL_TABLET | Freq: Every day | ORAL | 1 refills | Status: AC

## 2024-03-27 ENCOUNTER — Other Ambulatory Visit: Payer: Self-pay

## 2024-03-27 ENCOUNTER — Other Ambulatory Visit

## 2024-03-27 DIAGNOSIS — E785 Hyperlipidemia, unspecified: Secondary | ICD-10-CM

## 2024-03-27 NOTE — Telephone Encounter (Signed)
 Correct, no action required Continue taking the statin

## 2024-03-30 ENCOUNTER — Ambulatory Visit

## 2024-03-30 DIAGNOSIS — I639 Cerebral infarction, unspecified: Secondary | ICD-10-CM | POA: Diagnosis not present

## 2024-03-31 LAB — CUP PACEART REMOTE DEVICE CHECK
Date Time Interrogation Session: 20251123234215
Implantable Pulse Generator Implant Date: 20231005

## 2024-03-31 NOTE — Progress Notes (Signed)
 Remote Loop Recorder Transmission

## 2024-04-08 ENCOUNTER — Ambulatory Visit: Payer: Self-pay | Admitting: Internal Medicine

## 2024-04-10 NOTE — Telephone Encounter (Signed)
 Spoke with the patient in regards to his labs. Looks like the order was never released because it was placed incorrectly. Patient states that he did infact come in and have his blood drawn and I gave him a verbal understanding of that BUT he would have to come back in for another lab draw so that we can get his lipid panel. He because very upset about the Mistake that was made. I addressed his concerns and offered him a solution to his problem. He didn't want to accept my solution he wanted the lab to explain  what they did with the blood they drew and disconnected the call. I went and spoke with tammy in the lab and she advised me that the patient would have to come back in the office.

## 2024-04-30 ENCOUNTER — Ambulatory Visit

## 2024-04-30 DIAGNOSIS — I639 Cerebral infarction, unspecified: Secondary | ICD-10-CM

## 2024-05-01 LAB — CUP PACEART REMOTE DEVICE CHECK
Date Time Interrogation Session: 20251224232243
Implantable Pulse Generator Implant Date: 20231005

## 2024-05-01 NOTE — Progress Notes (Signed)
 Remote Loop Recorder Transmission

## 2024-05-03 ENCOUNTER — Ambulatory Visit: Payer: Self-pay | Admitting: Internal Medicine

## 2024-05-18 ENCOUNTER — Encounter: Payer: Self-pay | Admitting: Physical Medicine and Rehabilitation

## 2024-05-18 ENCOUNTER — Encounter: Attending: Physical Medicine and Rehabilitation | Admitting: Physical Medicine and Rehabilitation

## 2024-05-18 VITALS — BP 170/95 | HR 53 | Ht 70.0 in | Wt 191.0 lb

## 2024-05-18 DIAGNOSIS — G8114 Spastic hemiplegia affecting left nondominant side: Secondary | ICD-10-CM | POA: Diagnosis not present

## 2024-05-18 DIAGNOSIS — R269 Unspecified abnormalities of gait and mobility: Secondary | ICD-10-CM | POA: Diagnosis not present

## 2024-05-18 DIAGNOSIS — M21372 Foot drop, left foot: Secondary | ICD-10-CM | POA: Insufficient documentation

## 2024-05-18 DIAGNOSIS — I6381 Other cerebral infarction due to occlusion or stenosis of small artery: Secondary | ICD-10-CM | POA: Insufficient documentation

## 2024-05-18 DIAGNOSIS — M6281 Muscle weakness (generalized): Secondary | ICD-10-CM | POA: Insufficient documentation

## 2024-05-18 MED ORDER — BACLOFEN 5 MG PO TABS: 5.0000 mg | ORAL_TABLET | Freq: Three times a day (TID) | ORAL | 3 refills | Status: AC | PRN

## 2024-05-18 NOTE — Patient Instructions (Signed)
 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 with L mild L hemiparesis and spasticity . Also has lumbar radiculopathy which is improving with improving foot drop on L-  Con't Baclofen  5mg  2-3x/day- #270- with 3 refills-    2. - Needs recommendation for PCP- -  Dr Theophilus Andrews. - [phone] [facility]. - she's taking new patients.    3.  Sounds like needs to get ESI- for pain/catching in LLE-  and tightness in lower back.    4.  We discussed function and how can progress up to 10+ years after stroke.   5. F/U in 6 months- to follow up on function and progression/spasitcity

## 2024-05-18 NOTE — Progress Notes (Signed)
 "  Subjective:    Patient ID: Eddie Calhoun, male    DOB: 11/10/1950, 74 y.o.   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 with L mild L hemiparesis and spasticity . Also has lumbar radiculopathy which is improving with improving foot drop on L- Here for f/u on stroke and spasticity and L foot drop.    LLE is about the same to slightly better.   Has been walking differently-  not tripping, or foot stumbling as long as paying attention.  The whole time walking has to be conscious of gait.  If out walking in neighborhood-  looks at things, so not always looking down. Looks ahead.   Max distance walked-  max 3 miles- usually 1.5- to 2 miles. - walking 20 minute mile.    Still misses things-  like she didn't say something- not processing data as fast as used to be.    Noticing this more- but otherwise doing well.  No issues otherwise Loop recorder- clean for Afib for 2 years- lasts 3 years, so will take it out when gets back from   Went to [place]-  going to cruise to [place]-  in October- and booked final flights for May cruise-  going to [place]. 12 day cruise.  Already booked 2 more cruises as well - [place] and  going around [place].   Doing own lawn maintenance- doesn't use riding mower  Quit going to gym much- and still bowling- 2x/week in league.  Had a 500 series. Getting better and more consistent. Alos practices sometimes.  Plans to join [employer]- in long run- so can use pool.   Hopes to use more, the pool.    Spasticity- still there- Just takes Baclofen  5 mg 2x/day. It works well-  Sometimes worse if real active.  Leg will twitch on him- couple of minutes- not continuous- during that time.  The hand on L- if carries bowl of water- has to go slow- so it doesn't  spill.   Difficulty with PCP- wants new PCP-  lab issues- and talking with PCP-   Pain Inventory Average Pain 0 Pain Right Now 0 My pain is .  In the last 24 hours, has pain interfered with the following? General activity 0 Relation with others 0 Enjoyment of life 0 What TIME of day is your pain at its worst? na Sleep (in general) Good  Pain is worse with: . Pain improves with: . Relief from Meds: .  Family History  Problem Relation Age of Onset   Hypertension Mother    Colon cancer Father 63   Hypertension Father    Cancer Father 86       father died with hx of prostate cancer   Esophageal cancer Neg Hx    Rectal cancer Neg Hx    Stomach cancer Neg Hx    Social History   Socioeconomic History   Marital status: Married    Spouse name: Not on file   Number of children: Not on file   Years of education: Not on file   Highest education level: Associate degree: occupational, scientist, product/process development, or vocational program  Occupational History   Not on file  Tobacco Use   Smoking status: Former    Current packs/day: 0.00  Average packs/day: 2.0 packs/day for 20.0 years (40.0 ttl pk-yrs)    Types: Cigarettes    Start date: 09/21/1967    Quit date: 09/21/1987    Years since quitting: 36.6   Smokeless tobacco: Never  Vaping Use   Vaping status: Never Used  Substance and Sexual Activity   Alcohol use: Yes    Comment: social   Drug use: Never   Sexual activity: Not Currently  Other Topics Concern   Not on file  Social History Narrative   Not on file   Social Drivers of Health   Tobacco Use: Medium Risk (03/23/2024)   Patient History    Smoking Tobacco Use: Former    Smokeless Tobacco Use: Never    Passive Exposure: Not on Actuary Strain: Low Risk (03/16/2024)   Overall Financial Resource Strain (CARDIA)    Difficulty of Paying Living Expenses: Not hard at all  Food Insecurity: No Food Insecurity (03/16/2024)   Epic    Worried About Brewing Technologist in the Last Year: Never true    Ran Out of Food in the Last Year: Never true  Transportation Needs: No Transportation Needs (03/16/2024)   Epic    Lack of Transportation (Medical): No    Lack of Transportation (Non-Medical): No  Physical Activity: Sufficiently Active (03/16/2024)   Exercise Vital Sign    Days of Exercise per Week: 5 days    Minutes of Exercise per Session: 30 min  Stress: No Stress Concern Present (03/16/2024)   [employer] of Occupational Health - Occupational Stress Questionnaire    Feeling of Stress: Not at all  Social Connections: Socially Integrated (03/16/2024)   Social Connection and Isolation Panel    Frequency of Communication with Friends and Family: Once a week    Frequency of Social Gatherings with Friends and Family: Three times a week    Attends Religious Services: More than 4 times per year    Active Member of Clubs or Organizations: Yes    Attends Banker Meetings: More than 4 times per year    Marital Status: Married  Depression (PHQ2-9): Low Risk (01/02/2024)   Depression (PHQ2-9)    PHQ-2 Score: 0  Alcohol Screen: Low Risk (03/16/2024)   Alcohol Screen    Last Alcohol Screening Score (AUDIT): 4  Housing: Low Risk (03/16/2024)   Epic    Unable to Pay for Housing in the Last Year: No    Number of Times Moved in the Last Year: 0    Homeless in the Last Year: No  Utilities: Not At Risk (01/02/2024)   Epic    Threatened with loss of utilities: No  Health Literacy: Adequate Health Literacy (01/02/2024)   B1300 Health Literacy    Frequency of need for help with medical instructions: Never   Past Surgical History:  Procedure Laterality Date   CARPAL TUNNEL RELEASE Bilateral 04/2021   ingrown toenail Left    Past Surgical History:  Procedure Laterality Date   CARPAL TUNNEL RELEASE Bilateral 04/2021   ingrown toenail Left    Past Medical History:  Diagnosis Date   Allergy    allergic rhinitis   BPH (benign prostatic  hyperplasia)    Elevated prostate specific antigen (PSA)    Hypertension    Implantable loop recorder present 02/04/2022   Plantar fasciitis of right foot    Pneumonia    Thrombocytopenia, unspecified 07/26/2012   03/24/12  129,000!   BP (!) 170/95   Pulse ROLLEN)  53   Ht 5' 10 (1.778 m)   Wt 191 lb (86.6 kg)   SpO2 97%   BMI 27.41 kg/m   Opioid Risk Score:   Fall Risk Score:  `1  Depression screen [facility] 2/9     01/02/2024   11:02 AM 11/06/2023    9:23 AM 08/09/2023    8:46 AM 05/10/2023    1:21 PM 01/01/2023    1:07 PM 12/24/2022    9:13 AM 03/19/2022    2:35 PM  Depression screen PHQ 2/9  Decreased Interest 0 0 0 0 0 0 0  Down, Depressed, Hopeless 0 0 0 0 0 0 0  PHQ - 2 Score 0 0 0 0 0 0 0  Altered sleeping 0    0    Tired, decreased energy 0    0    Change in appetite 0    0    Feeling bad or failure about yourself  0    0    Trouble concentrating 0    0    Moving slowly or fidgety/restless 0    0    Suicidal thoughts     0    PHQ-9 Score 0     0     Difficult doing work/chores Not difficult at all    Not difficult at all       Data saved with a previous flowsheet row definition      Review of Systems  All other systems reviewed and are negative.      Objective:   Physical Exam  Awake, alert, appropriate, walked in with no assistive device, NAD  UEs 5/5 in B/L arms and deltoids LE's-  HF 4+/5; KE/KF 5-/5; DF 5-/5 and PF 4+/5.    Neuro: Brisk hoffmans' LUE No clonus on L side  3+ Biceps and patella MAS of 1 in LLE Gait- still circumducts on L- and very mild foot drop on left- less so  No AFO.  No lack of sensation- but no movement in lateral toes- 3rd/4th 5th digits. .     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 with L mild L hemiparesis and spasticity . Also has  lumbar radiculopathy which is improving with improving foot drop on L-  Con't Baclofen  5mg  2-3x/day- #270- with 3 refills-    2. - Needs recommendation for PCP- -  Dr Theophilus Andrews. - [phone] [facility]. - she's taking new patients.    3.  Sounds like needs to get ESI- for pain/catching in LLE-  and tightness in lower back.    4.  We discussed function and how can progress up to 10+ years after stroke.   5. F/U in 6 months- to follow up on function and progression/spasitcity    I spent a total of  34  minutes on total care today- >50% coordination of care- due to d/w  pt about function and how he's progressing- and how he can continue to progress with puts effort in.   "

## 2024-05-24 ENCOUNTER — Encounter: Payer: Self-pay | Admitting: Physical Medicine and Rehabilitation

## 2024-05-31 ENCOUNTER — Ambulatory Visit: Attending: Cardiovascular Disease

## 2024-05-31 DIAGNOSIS — I639 Cerebral infarction, unspecified: Secondary | ICD-10-CM

## 2024-06-01 LAB — CUP PACEART REMOTE DEVICE CHECK
Date Time Interrogation Session: 20260124233751
Implantable Pulse Generator Implant Date: 20231005

## 2024-06-02 ENCOUNTER — Ambulatory Visit: Payer: Self-pay | Admitting: Cardiovascular Disease

## 2024-06-04 NOTE — Progress Notes (Signed)
 Remote Loop Recorder Transmission

## 2024-07-01 ENCOUNTER — Ambulatory Visit

## 2024-08-01 ENCOUNTER — Ambulatory Visit

## 2024-08-25 ENCOUNTER — Encounter: Admitting: Internal Medicine

## 2024-11-16 ENCOUNTER — Encounter: Admitting: Physical Medicine and Rehabilitation

## 2025-01-07 ENCOUNTER — Ambulatory Visit
